# Patient Record
Sex: Male | Born: 1940 | Race: White | Hispanic: No | Marital: Married | Smoking: Never smoker
Health system: Southern US, Community
[De-identification: ages and names within clinical notes are randomized; demographics above are authoritative.]

## PROBLEM LIST (undated history)

## (undated) DIAGNOSIS — J189 Pneumonia, unspecified organism: Secondary | ICD-10-CM

## (undated) DIAGNOSIS — K222 Esophageal obstruction: Secondary | ICD-10-CM

## (undated) DIAGNOSIS — K5790 Diverticulosis of intestine, part unspecified, without perforation or abscess without bleeding: Secondary | ICD-10-CM

## (undated) DIAGNOSIS — K2960 Other gastritis without bleeding: Secondary | ICD-10-CM

## (undated) DIAGNOSIS — M1990 Unspecified osteoarthritis, unspecified site: Secondary | ICD-10-CM

## (undated) DIAGNOSIS — R55 Syncope and collapse: Secondary | ICD-10-CM

## (undated) DIAGNOSIS — I472 Ventricular tachycardia: Secondary | ICD-10-CM

## (undated) DIAGNOSIS — K449 Diaphragmatic hernia without obstruction or gangrene: Secondary | ICD-10-CM

## (undated) DIAGNOSIS — Z95818 Presence of other cardiac implants and grafts: Secondary | ICD-10-CM

## (undated) DIAGNOSIS — Z9861 Coronary angioplasty status: Secondary | ICD-10-CM

## (undated) DIAGNOSIS — I2102 ST elevation (STEMI) myocardial infarction involving left anterior descending coronary artery: Secondary | ICD-10-CM

## (undated) DIAGNOSIS — K219 Gastro-esophageal reflux disease without esophagitis: Secondary | ICD-10-CM

## (undated) DIAGNOSIS — H269 Unspecified cataract: Secondary | ICD-10-CM

## (undated) DIAGNOSIS — I2510 Atherosclerotic heart disease of native coronary artery without angina pectoris: Secondary | ICD-10-CM

## (undated) DIAGNOSIS — K2270 Barrett's esophagus without dysplasia: Secondary | ICD-10-CM

## (undated) DIAGNOSIS — E785 Hyperlipidemia, unspecified: Secondary | ICD-10-CM

## (undated) DIAGNOSIS — C4491 Basal cell carcinoma of skin, unspecified: Secondary | ICD-10-CM

## (undated) DIAGNOSIS — T7840XA Allergy, unspecified, initial encounter: Secondary | ICD-10-CM

## (undated) HISTORY — DX: Gastro-esophageal reflux disease without esophagitis: K21.9

## (undated) HISTORY — DX: Ventricular tachycardia: I47.2

## (undated) HISTORY — DX: ST elevation (STEMI) myocardial infarction involving left anterior descending coronary artery: I21.02

## (undated) HISTORY — PX: CARDIAC CATHETERIZATION: SHX172

## (undated) HISTORY — DX: Allergy, unspecified, initial encounter: T78.40XA

## (undated) HISTORY — DX: Other gastritis without bleeding: K29.60

## (undated) HISTORY — DX: Basal cell carcinoma of skin, unspecified: C44.91

## (undated) HISTORY — DX: Hyperlipidemia, unspecified: E78.5

## (undated) HISTORY — PX: PERCUTANEOUS CORONARY STENT INTERVENTION (PCI-S): SHX6016

## (undated) HISTORY — DX: Esophageal obstruction: K22.2

## (undated) HISTORY — DX: Unspecified cataract: H26.9

## (undated) HISTORY — DX: Unspecified osteoarthritis, unspecified site: M19.90

## (undated) HISTORY — DX: Diaphragmatic hernia without obstruction or gangrene: K44.9

## (undated) HISTORY — DX: Diverticulosis of intestine, part unspecified, without perforation or abscess without bleeding: K57.90

## (undated) HISTORY — PX: HERNIA REPAIR: SHX51

## (undated) HISTORY — DX: Coronary angioplasty status: Z98.61

## (undated) HISTORY — DX: Syncope and collapse: R55

## (undated) HISTORY — DX: Barrett's esophagus without dysplasia: K22.70

## (undated) HISTORY — DX: Atherosclerotic heart disease of native coronary artery without angina pectoris: I25.10

---

## 2004-11-03 DIAGNOSIS — I2510 Atherosclerotic heart disease of native coronary artery without angina pectoris: Secondary | ICD-10-CM

## 2004-11-03 DIAGNOSIS — Z9861 Coronary angioplasty status: Secondary | ICD-10-CM

## 2004-11-03 DIAGNOSIS — I25119 Atherosclerotic heart disease of native coronary artery with unspecified angina pectoris: Secondary | ICD-10-CM | POA: Insufficient documentation

## 2004-11-03 DIAGNOSIS — I2102 ST elevation (STEMI) myocardial infarction involving left anterior descending coronary artery: Secondary | ICD-10-CM

## 2004-11-03 HISTORY — DX: ST elevation (STEMI) myocardial infarction involving left anterior descending coronary artery: I21.02

## 2004-11-03 HISTORY — DX: Coronary angioplasty status: Z98.61

## 2004-11-03 HISTORY — DX: Atherosclerotic heart disease of native coronary artery without angina pectoris: I25.10

## 2007-11-17 ENCOUNTER — Ambulatory Visit: Payer: Self-pay | Admitting: Gastroenterology

## 2007-12-19 ENCOUNTER — Ambulatory Visit: Payer: Self-pay | Admitting: Gastroenterology

## 2007-12-26 ENCOUNTER — Ambulatory Visit: Payer: Self-pay | Admitting: Gastroenterology

## 2007-12-26 ENCOUNTER — Encounter: Payer: Self-pay | Admitting: Gastroenterology

## 2007-12-26 DIAGNOSIS — K2270 Barrett's esophagus without dysplasia: Secondary | ICD-10-CM | POA: Insufficient documentation

## 2007-12-26 DIAGNOSIS — K2960 Other gastritis without bleeding: Secondary | ICD-10-CM | POA: Insufficient documentation

## 2007-12-26 DIAGNOSIS — K5730 Diverticulosis of large intestine without perforation or abscess without bleeding: Secondary | ICD-10-CM | POA: Insufficient documentation

## 2007-12-26 DIAGNOSIS — K449 Diaphragmatic hernia without obstruction or gangrene: Secondary | ICD-10-CM | POA: Insufficient documentation

## 2007-12-26 DIAGNOSIS — K222 Esophageal obstruction: Secondary | ICD-10-CM | POA: Insufficient documentation

## 2008-01-26 ENCOUNTER — Ambulatory Visit: Payer: Self-pay | Admitting: Gastroenterology

## 2008-01-26 DIAGNOSIS — I2510 Atherosclerotic heart disease of native coronary artery without angina pectoris: Secondary | ICD-10-CM | POA: Insufficient documentation

## 2008-01-26 DIAGNOSIS — I252 Old myocardial infarction: Secondary | ICD-10-CM | POA: Insufficient documentation

## 2008-01-26 DIAGNOSIS — E785 Hyperlipidemia, unspecified: Secondary | ICD-10-CM | POA: Insufficient documentation

## 2008-01-26 DIAGNOSIS — E1169 Type 2 diabetes mellitus with other specified complication: Secondary | ICD-10-CM | POA: Insufficient documentation

## 2009-11-08 ENCOUNTER — Encounter (INDEPENDENT_AMBULATORY_CARE_PROVIDER_SITE_OTHER): Payer: Self-pay | Admitting: *Deleted

## 2010-07-10 ENCOUNTER — Telehealth: Payer: Self-pay | Admitting: Gastroenterology

## 2010-10-20 ENCOUNTER — Encounter (INDEPENDENT_AMBULATORY_CARE_PROVIDER_SITE_OTHER): Payer: Self-pay | Admitting: *Deleted

## 2010-10-20 ENCOUNTER — Telehealth: Payer: Self-pay | Admitting: Gastroenterology

## 2010-10-22 ENCOUNTER — Ambulatory Visit
Admission: RE | Admit: 2010-10-22 | Discharge: 2010-10-22 | Payer: Self-pay | Source: Home / Self Care | Attending: Gastroenterology | Admitting: Gastroenterology

## 2010-11-04 NOTE — Assessment & Plan Note (Signed)
Summary: OV      History of Present Illness: Alexander Armstrong returns today and is asymptomatic. Is on Protonix 40 mg a day. His previous endoscopy showed evidence of Barrett's mucosa. We'll continue chronic reflux regime and daily PPI therapy. He will have endoscopy in 2 years time. There is no evidence of dysplasia on his biopsies. He also has diverticulosis and is on a high fiber diet and fiber supplements. Vital signs are recorded in his chart. General physical exam is not performed.            Patient Instructions: 1)  Please schedule a follow-up appointment in 1 year. 2)  Barrett's Esophagus brochure given. 3)  Diet should be high in fiber (fruits, vegetables, whole grains) but low in residue. Drink at least eight (8) glasses of water a day. 4)  Please Continue current medications.     Appended Document: OV Vitals: Wt: 200 BP: 94/70 Pulse: 60

## 2010-11-04 NOTE — Letter (Signed)
Summary: Endoscopy Letter  [facility]  [address] [place], [place] [address]   Phone: [phone]  Fax: [phone]      November 08, 2009 MRN: [id number]   [facility] [address] [place], [place]  [address]   Dear Alexander Armstrong,   According to your medical record, it is time for you to schedule an Endoscopy. Endoscopic screening is recommended for patients with certain upper digestive tract conditions because of associated increased risk for cancers of the upper digestive system.  This letter has been generated based on the recommendations made at the time of your prior procedure. If you feel that in your particular situation this may no longer apply, please contact our office.  Please call our office at [phone]) to schedule this appointment or to update your records at your earliest convenience.  Thank you for cooperating with [place] to provide you with the very best care possible.   Sincerely,  Vania Rea. Jarold Motto, M.D.  [facility] [phone]

## 2010-11-04 NOTE — Progress Notes (Signed)
Summary: Schedule Endoscopy  Phone Note Outgoing Call Call back at [facility] Phone [phone]   Call placed by: Harlow Mares CMA Duncan Dull),  July 10, 2010 3:33 PM Call placed to: Patient Summary of Call: Left a message on patients machine to call back. patient is due for an EGD for barretts Initial call taken by: Harlow Mares CMA Duncan Dull),  July 10, 2010 3:34 PM  Follow-up for Phone Call        Left a message on the patient machine to call back and schedule a previsit and procedure with our office. A letter will be mailed to the patient.   Follow-up by: Harlow Mares CMA Duncan Dull),  July 18, 2010 11:37 AM

## 2010-11-05 ENCOUNTER — Other Ambulatory Visit: Payer: Self-pay | Admitting: Gastroenterology

## 2010-11-05 ENCOUNTER — Other Ambulatory Visit (AMBULATORY_SURGERY_CENTER): Payer: Medicare Other | Admitting: Gastroenterology

## 2010-11-05 ENCOUNTER — Ambulatory Visit: Admit: 2010-11-05 | Payer: Self-pay | Admitting: Gastroenterology

## 2010-11-05 ENCOUNTER — Encounter: Payer: Self-pay | Admitting: Gastroenterology

## 2010-11-05 DIAGNOSIS — K449 Diaphragmatic hernia without obstruction or gangrene: Secondary | ICD-10-CM

## 2010-11-05 DIAGNOSIS — K219 Gastro-esophageal reflux disease without esophagitis: Secondary | ICD-10-CM

## 2010-11-05 DIAGNOSIS — K2270 Barrett's esophagus without dysplasia: Secondary | ICD-10-CM

## 2010-11-05 HISTORY — DX: Diaphragmatic hernia without obstruction or gangrene: K44.9

## 2010-11-06 NOTE — Progress Notes (Signed)
Summary: question re: Plavix  Phone Note Outgoing Call   Call placed by: Karl Bales RN,  October 20, 2010 2:03 PM Summary of Call: Verified with Harlow Mares, CMA, if pt needs to hold Plavix for EGD and if letter needs to be generated to prescribing physician.  Hulan Saas states that Plavix doesn't need to be held for EGD Initial call taken by: Karl Bales RN,  October 20, 2010 2:04 PM

## 2010-11-06 NOTE — Miscellaneous (Signed)
Summary: RECALL EGD.Marland KitchenJJ.  Clinical Lists Changes  Observations: Added new observation of NKA: T (10/22/2010 15:45)

## 2010-11-06 NOTE — Letter (Signed)
Summary: EGD Instructions  [facility]  [address]   [place], [place] [address]   Phone: [phone]  Fax: [phone]       Alexander Armstrong    1941/04/27    MRN: [id number]       Procedure Day [name]: Wednesday, 11-05-10     Arrival Time:  1:00 p.m.     Procedure Time: 2:00 p.m.     Location of Procedure:                    x  [facility] (4th Floor)    PREPARATION FOR ENDOSCOPY   On 11-05-10  THE DAY OF THE PROCEDURE:  1.   No solid foods, milk or milk products are allowed after midnight the night before your procedure.  2.   Do not drink anything colored red or purple.  Avoid juices with pulp.  No orange juice.  3.  You may drink clear liquids until 12:00 p.m., which is 2 hours before your procedure.                                                                                                CLEAR LIQUIDS INCLUDE: Water Jello Ice Popsicles Tea (sugar ok, no milk/cream) Powdered fruit flavored drinks Coffee (sugar ok, no milk/cream) Gatorade Juice: apple, white grape, white cranberry  Lemonade Clear bullion, consomm, broth Carbonated beverages (any kind) Strained chicken noodle soup Hard Candy   MEDICATION INSTRUCTIONS  Unless otherwise instructed, you should take regular prescription medications with a small sip of water as early as possible the morning of your procedure.  Diabetic patients - see separate instructions.               OTHER INSTRUCTIONS  You will need a responsible adult at least 70 years of age to accompany you and drive you home.   This person must remain in the waiting room during your procedure.  Wear loose fitting clothing that is easily removed.  Leave jewelry and other valuables at home.  However, you may wish to bring a book to read or an iPod/MP3 player to listen to music as you wait for your procedure to start.  Remove all body piercing jewelry and leave at home.  Total time from sign-in until  discharge is approximately 2-3 hours.  You should go home directly after your procedure and rest.  You can resume normal activities the day after your procedure.  The day of your procedure you should not:   Drive   Make legal decisions   Operate machinery   Drink alcohol   Return to work  You will receive specific instructions about eating, activities and medications before you leave.    The above instructions have been reviewed and explained to me by  Karl Bales RN  October 22, 2010 4:18 PM     I fully understand and can verbalize these instructions _____________________________ Date _________

## 2010-11-11 ENCOUNTER — Encounter: Payer: Self-pay | Admitting: Gastroenterology

## 2010-11-12 NOTE — Procedures (Addendum)
Summary: Upper Endoscopy  Patient: Alexander Armstrong Note: All result statuses are Final unless otherwise noted.  Tests: (1) Upper Endoscopy (EGD)   EGD Upper Endoscopy       DONE     [facility]     [address]     [place], [place]  [address]           ENDOSCOPY PROCEDURE REPORT           PATIENT:  Alexander, Armstrong  MR#:  [id number]     BIRTHDATE:  [birth year], 69 yrs. old  GENDER:  male           ENDOSCOPIST:  Vania Rea. Jarold Motto, MD, [facility]     Referred by:           PROCEDURE DATE:  11/05/2010     PROCEDURE:  EGD with biopsy, 78469     ASA CLASS:  Class II     INDICATIONS:  Screening for Barrett's esopahgus in a GERD patient.                 MEDICATIONS:   Fentanyl 50 mcg IV, Versed 5 mg IV     TOPICAL ANESTHETIC:  Exactacain Spray           DESCRIPTION OF PROCEDURE:   After the risks benefits and     alternatives of the procedure were thoroughly explained, informed     consent was obtained.  The LB GIF-H180 [id number] endoscope was     introduced through the mouth and advanced to the second portion of     the duodenum, without limitations.  The instrument was slowly     withdrawn as the mucosa was fully examined.     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 3 cm. finger ofBARRETT'S MUCOSA     BIOPSIEDX 4.  A hiatal hernia was found. 3 CM. HH NOTED.NO     STRICTURE OR ACTIVE ESOPHAGITIS NOTED.  Normal duodenal folds were     noted.  The stomach was entered and closely examined. The antrum,     angularis, and lesser curvature were well visualized, including a     retroflexed view of the cardia and fundus. The stomach wall was     normally distensable. The scope passed easily through the pylorus     into the duodenum.    Retroflexed views revealed a hiatal hernia.     The scope was then withdrawn from the patient and the procedure     completed.           COMPLICATIONS:  None           ENDOSCOPIC IMPRESSION:     1) Barrett's esophagus     2)  Hiatal hernia     3) Normal duodenal folds     4) Normal stomach     5) A hiatal hernia     R/O DYSPLASIA.     RECOMMENDATIONS:     1) Await biopsy results     2) REPEAT SURVEILLANCE EGD IN 3 YEARS     TRIAL OF DEXILANT 60MG /QAM PER BREAKTHROUGH GERD SYMPTOMS.           REPEAT EXAM:  No           ______________________________     Vania Rea. Jarold Motto, MD, [facility]           CC:  Wilmer Floor MD  n.     eSIGNED:   Vania Rea. Langston Tuberville at 11/05/2010 02:29 PM           Esaw Dace, [id number]  Note: An exclamation mark (!) indicates a result that was not dispersed into the flowsheet. Document Creation Date: 11/05/2010 2:30 PM _______________________________________________________________________  (1) Order result status: Final Collection or observation date-time: 11/05/2010 14:22 Requested date-time:  Receipt date-time:  Reported date-time:  Referring Physician:   Ordering Physician: Sheryn Bison [phone]) Specimen Source:  Source: Launa Grill Order Number: [id number] Lab site:   Appended Document: Upper Endoscopy     Procedures Next Due Date:    EGD: 11/2013  Appended Document: Upper Endoscopy     Procedures Next Due Date:    EGD: 11/2013

## 2010-11-12 NOTE — Miscellaneous (Signed)
Summary: Dexilant Samples  Clinical Lists Changes  Medications: Changed medication from PROTONIX 40 MG TBEC (PANTOPRAZOLE SODIUM) Take 1 tablet by mouth once a day to DEXILANT 60 MG CPDR (DEXLANSOPRAZOLE) take one by mouth once daily

## 2010-11-20 NOTE — Letter (Signed)
Summary: Patient Notice-Barrett's [facility]  [address] [place], [place] [address]   Phone: [phone]  Fax: [phone]        November 11, 2010 MRN: [id number]    [facility] [address] [place], [place]  [address]    Dear Mr. Lacorte,  I am pleased to inform you that the biopsies taken during your recent endoscopic examination did not show any evidence of cancer upon pathologic examination.  However, your biopsies indicate you have a condition known as Barrett's esophagus. While not cancer, it is pre-cancerous (can progress to cancer) and needs to be monitored with repeat endoscopic examination and biopsies.  Fortunately, it is quite rare that this develops into cancer, but careful monitoring of the condition along with taking your medication as prescribed is important in reducing the risk of developing cancer.  It is my recommendation that you have a repeat upper gastrointestinal endoscopic examination in 3_ years.  Additional information/recommendations:  __Please call [phone] to schedule a return visit to further      evaluate your condition.  xx__Continue with treatment plan as outlined the day of your exam.I would recommend continuing Dexilant 60 mg a day every morning for GERD and your Barrett's mucosa. Please call if you are still having problems on this medication.  Please call us if you have or develop heartburn, reflux symptoms, any swallowing problems, or if you have questions about your condition that have not been fully answered at this time.  Sincerely,  Mardella Layman MD [facility]  This letter has been electronically signed by your physician.  Appended Document: Patient Notice-Barrett's Esopghagus Letter Mailed

## 2011-02-17 NOTE — Assessment & Plan Note (Signed)
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   Alexander Armstrong really denies GI complaints, although on close questioning, he  has had acid reflux for at least ten years and has been on daily  Prilosec during that time.  He apparently had a barium swallow some five  years ago and was having dysphagia.  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  He has not had endoscopic exam or  dilations.  He currently says his reflux, which consists mostly of  regurgitation and burning substernal chest pain, is all relieved by  taking regular Prilosec.  If he skips his medication, he has marked  symptomatology.   He has fairly regular bowel movements, but will occasionally waken from  a sound sleep with severe lower abdominal pain, followed by tenesmus,  but no rectal bleeding.  This happens once every two to three months.  Again, he has never had barium studies or endoscopic exams of his lower  bowel.  He gives no history of anemia or iron deficiency.  He does not  think he has ever done Hemoccult cards.  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  He follows a regular diet,  denies any specific food intolerances and has had no anorexia or weight-  loss.  Denies any history of hepatitis, pancreatitis, or any  hepatobiliary symptomatology.  He denies abuse of NSAIDs, alcohol, or  cigarettes.   PAST MEDICAL HISTORY:  Remarkable for rather severe coronary artery  disease with an MI some three years ago, requiring emergency cardiac  catheterization and  angioplasty, along with multiple stent placement by  Dr. Chales Abrahams.  He has been on aspirin and Plavix since that time and  apparently had repeat angiography within the last year.  Patient has no  current angina type complaints, palpitations, or symptoms of congestive  heart failure.  He says he does bruise easily, but has been told to  continue his aspirin and Plavix, without discontinuing these medications  for surgical procedures.  He also has had previous umbilical hernia  surgical repair some 20 years ago.  He does suffer from hyperlipidemia.   MEDICATIONS:  1. Lipitor 20 mg a day.  2. Plavix 75 mg a day.  3. Metoprolol 25 mg a day.  4. Prilosec over-the-counter 20 mg a day.  5. Aspirin 81 mg a day.  6. Glucosamine 1500 mg two a day.  7. Chondroitin 1200 mg two a day.  8. Fish oil 2000 mg a day.   He denies drug allergies.   FAMILY HISTORY:  Noncontributory.  His mother apparently did have  uterine cancer.   SOCIAL HISTORY:  The patient is married and lives  with his wife, has a  high school education.  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  He does not smoke or use ethanol and gives no history of abuse of these  substances.   REVIEW OF SYSTEMS:  Remarkable for some vague arthralgias, but otherwise  review of systems is noncontributory.  As mentioned above, he has no  current cardiovascular or pulmonary complaints.   He is a healthy-appearing white male in no distress, appearing somewhat  younger than his stated age.  He is 6 feet tall and weighs 201 pounds.  Blood pressure is 120/78,  pulse was 78 and regular.  I could not appreciate stigmata of chronic liver disease or thyromegaly.  His chest was entirely clear.  He appeared to be in a regular rhythm  without murmurs, gallops or rubs.  I could not appreciate hepatosplenomegaly, abdominal masses or  tenderness.  Bowel sounds were normal.  Peripheral extremities were unremarkable.  Mental status was clear.   Rectal exam was deferred.   ASSESSMENT:  1. Chronic acid reflux.  Rule out Barrett's mucosa.  2. Need for screening colonoscopy with unexplained episodes of what      sounds like a variant of irritable bowel syndrome and possible      diverticulosis coli.  3. Atherosclerosis with severe coronary artery disease, previous MI,      and history of angioplasty and stent placements.  4. Chronic anticoagulation with aspirin and Plavix, related to #3.  5. Previous umbilical hernia surgical repair without obvious hernia on      exam today.   RECOMMENDATIONS:  1. Outpatient endoscopy and colonoscopy at his convenience.  2. We will ask Dr. Olena Leatherwood to send [place] any pertinent laboratory data,      including recent CBC.  3. I have given the patient information to give to his physicians      concerning cytochrome P-450 metabolism of Plavix and proton pump      inhibitor use.  Up to 30% of patients on Plavix can have inhibition      of activation by Plavix per proton pump inhibitors, but Protonix      does not affect this metabolic pathway.  I would suggest switching      the patient, if his physicians agree, to Protonix 40 mg a day in      place of Prilosec.  4. We will ask Dr. Chales Abrahams and Dr. Bartholomew Crews opinion as to holding the      patient's Plavix and aspirin for colonoscopy and possible      polypectomy, although I doubt this will be possible with his      stenting.  For now, we will plan to schedule his procedure on these      anticoagulants with the      known increased risk of possible bleeding, post-polypectomy.  5. Continue all other medications mentioned above.     Vania Rea. Jarold Motto, MD, Caleen Essex, FAGA  Electronically Signed    DRP/MedQ  DD: 11/17/2007  DT: 11/18/2007  Job #: [id number]   cc:   [place]  Dr. Constance Haw [address] [facility]  Wil[place] Floor

## 2011-07-20 DIAGNOSIS — Z85828 Personal history of other malignant neoplasm of skin: Secondary | ICD-10-CM | POA: Insufficient documentation

## 2011-10-19 DIAGNOSIS — Z483 Aftercare following surgery for neoplasm: Secondary | ICD-10-CM | POA: Insufficient documentation

## 2012-03-10 ENCOUNTER — Encounter: Payer: Self-pay | Admitting: Gastroenterology

## 2012-04-05 ENCOUNTER — Other Ambulatory Visit (INDEPENDENT_AMBULATORY_CARE_PROVIDER_SITE_OTHER): Payer: Medicare Other

## 2012-04-05 ENCOUNTER — Encounter: Payer: Self-pay | Admitting: Gastroenterology

## 2012-04-05 ENCOUNTER — Ambulatory Visit (INDEPENDENT_AMBULATORY_CARE_PROVIDER_SITE_OTHER): Payer: Medicare Other | Admitting: Gastroenterology

## 2012-04-05 VITALS — BP 96/60 | HR 72 | Ht 72.0 in | Wt 203.4 lb

## 2012-04-05 DIAGNOSIS — R6889 Other general symptoms and signs: Secondary | ICD-10-CM

## 2012-04-05 DIAGNOSIS — K2270 Barrett's esophagus without dysplasia: Secondary | ICD-10-CM

## 2012-04-05 DIAGNOSIS — I2510 Atherosclerotic heart disease of native coronary artery without angina pectoris: Secondary | ICD-10-CM

## 2012-04-05 DIAGNOSIS — D649 Anemia, unspecified: Secondary | ICD-10-CM

## 2012-04-05 DIAGNOSIS — R109 Unspecified abdominal pain: Secondary | ICD-10-CM

## 2012-04-05 LAB — CBC WITH DIFFERENTIAL/PLATELET
Basophils Absolute: 0.1 10*3/uL (ref 0.0–0.1)
Basophils Relative: 1 % (ref 0.0–3.0)
Eosinophils Absolute: 0.3 10*3/uL (ref 0.0–0.7)
Eosinophils Relative: 4.6 % (ref 0.0–5.0)
HCT: 41.4 % (ref 39.0–52.0)
Hemoglobin: 13.9 g/dL (ref 13.0–17.0)
Lymphocytes Relative: 19.2 % (ref 12.0–46.0)
Lymphs Abs: 1.2 10*3/uL (ref 0.7–4.0)
MCHC: 33.6 g/dL (ref 30.0–36.0)
MCV: 94.6 fl (ref 78.0–100.0)
Monocytes Absolute: 0.8 10*3/uL (ref 0.1–1.0)
Monocytes Relative: 12.6 % — ABNORMAL HIGH (ref 3.0–12.0)
Neutro Abs: 3.9 10*3/uL (ref 1.4–7.7)
Neutrophils Relative %: 62.6 % (ref 43.0–77.0)
Platelets: 182 10*3/uL (ref 150.0–400.0)
RBC: 4.38 Mil/uL (ref 4.22–5.81)
RDW: 13.2 % (ref 11.5–14.6)
WBC: 6.3 10*3/uL (ref 4.5–10.5)

## 2012-04-05 LAB — HEPATIC FUNCTION PANEL
ALT: 28 U/L (ref 0–53)
AST: 32 U/L (ref 0–37)
Albumin: 3.7 g/dL (ref 3.5–5.2)
Alkaline Phosphatase: 57 U/L (ref 39–117)
Bilirubin, Direct: 0.1 mg/dL (ref 0.0–0.3)
Total Bilirubin: 0.7 mg/dL (ref 0.3–1.2)
Total Protein: 7.3 g/dL (ref 6.0–8.3)

## 2012-04-05 LAB — BASIC METABOLIC PANEL
BUN: 22 mg/dL (ref 6–23)
CO2: 24 mEq/L (ref 19–32)
Calcium: 9.6 mg/dL (ref 8.4–10.5)
Chloride: 105 mEq/L (ref 96–112)
Creatinine, Ser: 0.9 mg/dL (ref 0.4–1.5)
GFR: 90.77 mL/min (ref >60.00)
Glucose, Bld: 101 mg/dL — ABNORMAL HIGH (ref 70–99)
Potassium: 4.3 mEq/L (ref 3.5–5.1)
Sodium: 137 mEq/L (ref 135–145)

## 2012-04-05 LAB — FOLATE: Folate: >24.8 ng/mL (ref >5.9)

## 2012-04-05 LAB — IBC PANEL
Iron: 86 ug/dL (ref 42–165)
Saturation Ratios: 28.8 % (ref 20.0–50.0)
Transferrin: 213.3 mg/dL (ref 212.0–360.0)

## 2012-04-05 LAB — TSH: TSH: 2.44 u[IU]/mL (ref 0.35–5.50)

## 2012-04-05 LAB — FERRITIN: Ferritin: 100.8 ng/mL (ref 22.0–322.0)

## 2012-04-05 LAB — VITAMIN B12: Vitamin B-12: 275 pg/mL (ref 211–911)

## 2012-04-05 NOTE — Patient Instructions (Addendum)
You will need a follow up colonoscopy/Endoscopy in 2015 You will go to the basement for labs today

## 2012-04-05 NOTE — Progress Notes (Signed)
This is a 71 year old Caucasian male with chronic acid reflux in Barrett's mucosa who is currently asymptomatic on daily Protonix 40 mg. He is up-to-date on his endoscopy and colonoscopy. The patient has regular bowel movements without melena, hematochezia, and denies chest pain or dysphagia. His appetite is good his weight is stable. He does have coronary artery disease with previous stents, and is followed by cardiology in [facility]. He does not have a primary care physician.  Current Medications, Allergies, Past Medical History, Past Surgical History, Family History and Social History were reviewed in [employer] record.  Pertinent Review of Systems Negative   Physical Exam: Blood pressure 96/60, pulse 72 and Reglan, and weight 203 pounds with BMI 27.58. His a healthy-appearing patient in no acute distress. I cannot appreciate stigmata of chronic liver disease. Chest is clear and he is in a regular rhythm without murmurs gallops or rubs. There is no hepatosplenomegaly, but the does appear to be a slightly prominent right hepatic lobe which is nonnodular nontender. I cannot appreciate splenomegaly or abdominal ascites. Bowel sounds are normal. Peripheral extremities are unremarkable. Mental status is normal.    Assessment and Plan: Well controlled GERD with Barrett's mucosa. I have urged him to continue daily PPI therapy, and he is due for followup endoscopy and colonoscopy in 2015. Labs ordered today for review including liver function test and anemia profile. Patient is on Plavix 75 mg a day and aspirin 81 mg a day. I have asked him to return IFOB  Stool cards for occult blood examination. Standard antireflux regime reviewed with patient.  Encounter Diagnoses  Name Primary?  . Barrett's esophagus Yes  . CAD (coronary artery disease)   . Abdominal  pain, other specified site   . Anemia   . Other general symptoms

## 2012-05-06 ENCOUNTER — Other Ambulatory Visit: Payer: Medicare Other

## 2012-05-06 DIAGNOSIS — K2270 Barrett's esophagus without dysplasia: Secondary | ICD-10-CM

## 2012-05-06 LAB — FECAL OCCULT BLOOD, IMMUNOCHEMICAL: Fecal Occult Bld: NEGATIVE

## 2012-10-05 DIAGNOSIS — I4729 Other ventricular tachycardia: Secondary | ICD-10-CM

## 2012-10-05 DIAGNOSIS — I472 Ventricular tachycardia: Secondary | ICD-10-CM

## 2012-10-05 HISTORY — PX: ELECTROPHYSIOLOGIC STUDY: SHX172A

## 2012-10-05 HISTORY — DX: Other ventricular tachycardia: I47.29

## 2012-10-05 HISTORY — DX: Ventricular tachycardia: I47.2

## 2013-09-26 ENCOUNTER — Ambulatory Visit: Payer: Medicare Other | Admitting: Gastroenterology

## 2013-10-24 ENCOUNTER — Encounter: Payer: Self-pay | Admitting: Gastroenterology

## 2013-10-24 ENCOUNTER — Telehealth: Payer: Self-pay | Admitting: *Deleted

## 2013-10-24 ENCOUNTER — Ambulatory Visit (INDEPENDENT_AMBULATORY_CARE_PROVIDER_SITE_OTHER): Payer: Medicare Other | Admitting: Gastroenterology

## 2013-10-24 VITALS — BP 114/82 | HR 67 | Ht 72.0 in | Wt 201.2 lb

## 2013-10-24 DIAGNOSIS — K2270 Barrett's esophagus without dysplasia: Secondary | ICD-10-CM

## 2013-10-24 DIAGNOSIS — K219 Gastro-esophageal reflux disease without esophagitis: Secondary | ICD-10-CM

## 2013-10-24 DIAGNOSIS — Z7901 Long term (current) use of anticoagulants: Secondary | ICD-10-CM

## 2013-10-24 NOTE — Patient Instructions (Signed)
You have been scheduled for an endoscopy with propofol. Please follow written instructions given to you at your visit today. If you use inhalers (even only as needed), please bring them with you on the day of your procedure. Your physician has requested that you go to www.startemmi.com and enter the access code given to you at your visit today. This web site gives a general overview about your procedure. However, you should still follow specific instructions given to you by our office regarding your preparation for the procedure.  You will be contacted by our office prior to your procedure for directions on holding your Plavix.  If you do not hear from our office 1 week prior to your scheduled procedure, please call [phone] to discuss.

## 2013-10-24 NOTE — Telephone Encounter (Signed)
I called Dr. Valentina Lucks office regarding holding Plavix for procedure--number is [phone]. Was advised to fax below letter to [phone]       Dear Dr.Tyson,    We have scheduled the above patient for an endoscopic procedure. Our records show that he is on anticoagulation therapy.   Please advise as to how long the patient may come off his therapy of Plavix prior to the procedure, which is scheduled for 11-06-2013.  Please fax back/ or route the completed form to [place], [place] at [phone].   Sincerely,    Hope Pigeon

## 2013-10-24 NOTE — Progress Notes (Signed)
This is a 73 year old Caucasian male who has chronic GERD managed by daily Protonix 40 mg.  He has no dysphagia, other upper GI, lower GI, or hepatobiliary complaints.  He does have diverticulosis with an otherwise negative colonoscopy 5 years ago.  He is due for followup endoscopy with dysplasia screening of his Barrett's mucosa.  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 Baxter Hire in [place] ,[place].  Current Medications, Allergies, Past Medical History, Past Surgical History, Family History and Social History were reviewed in [employer] record.  ROS: All systems were reviewed and are negative unless otherwise stated in the HPI.          Physical Exam: Healthy-appearing patient in no distress.  Blood pressure 114/82, pulse 67, and weight 201 pounds.  Cannot appreciate stigmata of chronic liver disease.  Chest is clear he appears to be in a regular rhythm without murmurs gallops or rubs.  There is no organomegaly, abdominal masses or tenderness.  Bowel sounds are normal.  Peripheral extremities are unremarkable and mental status is normal.    Assessment and Plan: Chronic GERD well-managed with daily PPI therapy.  This patient has Barrett's mucosa and is due for screening endoscopy with dysplasia biopsies.  We'll schedule this after clearance with cardiology so we can hold his Plavix 5 days before the procedure unless otherwise advised.  Standard antireflux maneuvers reviewed and we'll continue Protonix 40 mg a day.  I do not think he needs to followup colonoscopy at this time.

## 2013-10-26 ENCOUNTER — Telehealth: Payer: Self-pay | Admitting: *Deleted

## 2013-10-26 NOTE — Telephone Encounter (Signed)
Patient called back I advised patient that per Dr. Elonda Husky ok to hold his Plavix 5 days before procedure Patient verbalized understanding

## 2013-10-26 NOTE — Telephone Encounter (Signed)
Received via fax from Dr. Elonda Husky office letter back that it is ok for patient to hold Plavix 5 days before procedure. Sent downstairs to be scanned into patient's chart.  I called patient to advise him that per Dr. Elonda Husky ok to hold Plavix for five days prior. Had to leave message with patient's wife for patient to call the office back, waiting on call back.

## 2013-11-06 ENCOUNTER — Encounter: Payer: Self-pay | Admitting: Gastroenterology

## 2013-11-06 ENCOUNTER — Ambulatory Visit (AMBULATORY_SURGERY_CENTER): Payer: Medicare Other | Admitting: Gastroenterology

## 2013-11-06 VITALS — BP 107/67 | HR 59 | Temp 95.7°F | Resp 13 | Ht 72.0 in | Wt 201.0 lb

## 2013-11-06 DIAGNOSIS — K2270 Barrett's esophagus without dysplasia: Secondary | ICD-10-CM

## 2013-11-06 DIAGNOSIS — K219 Gastro-esophageal reflux disease without esophagitis: Secondary | ICD-10-CM

## 2013-11-06 MED ORDER — SODIUM CHLORIDE 0.9 % IV SOLN: 500.0000 mL | INTRAVENOUS | Status: DC

## 2013-11-06 NOTE — Progress Notes (Signed)
Procedure ends, to recovery, report given and VSS. 

## 2013-11-06 NOTE — Op Note (Signed)
[place]  [address] [place], [address]   ENDOSCOPY PROCEDURE REPORT  PATIENT: Alexander Armstrong, Alexander Armstrong  MR#: [id number] BIRTHDATE: May 19, 1941 , 74  yrs. old GENDER: Male ENDOSCOPIST:Arden Axon Consuello Masse, MD, [facility] REFERRED BY: PROCEDURE DATE:  11/06/2013 PROCEDURE:   EGD w/ biopsy ASA CLASS:    Class II INDICATIONS: Barrett's screening. MEDICATION: Propofol (Diprivan) 110 mg IV TOPICAL ANESTHETIC:  DESCRIPTION OF PROCEDURE:   After the risks and benefits of the procedure were explained, informed consent was obtained.  The LB [id number]  endoscope was introduced through the mouth  and advanced to the second portion of the duodenum .  The instrument was slowly withdrawn as the mucosa was fully examined.      DUODENUM: The duodenal mucosa showed no abnormalities in the bulb and second portion of the duodenum.  STOMACH: The mucosa of the stomach appeared normal.  ESOPHAGUS: There was evidence of a short segment of Barrett's esophagus in the lower third of the esophagus.  Multiple biopsies were performed using cold forceps.See pictures for documentation. Sample sent for histology.    Retroflexed views revealed a hiatal hernia.    The scope was then withdrawn from the patient and the procedure completed.  COMPLICATIONS: There were no complications.   ENDOSCOPIC IMPRESSION: 1.   The duodenal mucosa showed no abnormalities in the bulb and second portion of the duodenum 2.   The mucosa of the stomach appeared normal 3.   There was evidence of Barrett's esophagus; multiple biopsies ...chronic treated GERD  RECOMMENDATIONS: 1.  Await pathology results 2.  Continue current medications...resume Plavix tomorrow 3. F/U per pathology exam    _______________________________ eSigned:  Sable Feil, MD, [facility] 11/06/2013 10:15 AM   standard discharge

## 2013-11-06 NOTE — Progress Notes (Signed)
Called to room post procedure, verified with staff identification and specimen taken.

## 2013-11-06 NOTE — Patient Instructions (Signed)

## 2013-11-07 ENCOUNTER — Telehealth: Payer: Self-pay

## 2013-11-07 NOTE — Telephone Encounter (Signed)
Left message on answering machine. 

## 2013-11-10 ENCOUNTER — Encounter: Payer: Self-pay | Admitting: Gastroenterology

## 2014-07-05 HISTORY — PX: NM MYOVIEW LTD: HXRAD82

## 2015-09-17 DIAGNOSIS — I472 Ventricular tachycardia: Secondary | ICD-10-CM | POA: Insufficient documentation

## 2015-09-17 DIAGNOSIS — I4729 Other ventricular tachycardia: Secondary | ICD-10-CM | POA: Insufficient documentation

## 2015-11-18 DIAGNOSIS — Z959 Presence of cardiac and vascular implant and graft, unspecified: Secondary | ICD-10-CM | POA: Insufficient documentation

## 2015-11-18 DIAGNOSIS — R7309 Other abnormal glucose: Secondary | ICD-10-CM | POA: Insufficient documentation

## 2015-11-18 DIAGNOSIS — M545 Low back pain, unspecified: Secondary | ICD-10-CM | POA: Insufficient documentation

## 2015-11-18 DIAGNOSIS — L600 Ingrowing nail: Secondary | ICD-10-CM | POA: Insufficient documentation

## 2015-11-18 DIAGNOSIS — H524 Presbyopia: Secondary | ICD-10-CM | POA: Insufficient documentation

## 2015-11-18 DIAGNOSIS — K429 Umbilical hernia without obstruction or gangrene: Secondary | ICD-10-CM | POA: Insufficient documentation

## 2015-11-18 DIAGNOSIS — R002 Palpitations: Secondary | ICD-10-CM | POA: Insufficient documentation

## 2015-11-18 DIAGNOSIS — H2513 Age-related nuclear cataract, bilateral: Secondary | ICD-10-CM | POA: Insufficient documentation

## 2015-11-18 DIAGNOSIS — H43813 Vitreous degeneration, bilateral: Secondary | ICD-10-CM | POA: Insufficient documentation

## 2015-11-18 DIAGNOSIS — R351 Nocturia: Secondary | ICD-10-CM

## 2015-11-18 DIAGNOSIS — Z961 Presence of intraocular lens: Secondary | ICD-10-CM | POA: Insufficient documentation

## 2015-11-18 DIAGNOSIS — K219 Gastro-esophageal reflux disease without esophagitis: Secondary | ICD-10-CM | POA: Insufficient documentation

## 2015-11-18 DIAGNOSIS — N401 Enlarged prostate with lower urinary tract symptoms: Secondary | ICD-10-CM | POA: Insufficient documentation

## 2015-11-18 DIAGNOSIS — H5203 Hypermetropia, bilateral: Secondary | ICD-10-CM | POA: Insufficient documentation

## 2015-11-18 DIAGNOSIS — E119 Type 2 diabetes mellitus without complications: Secondary | ICD-10-CM | POA: Insufficient documentation

## 2016-03-10 DIAGNOSIS — R55 Syncope and collapse: Secondary | ICD-10-CM | POA: Insufficient documentation

## 2016-10-15 ENCOUNTER — Telehealth: Payer: Self-pay | Admitting: *Deleted

## 2016-10-15 NOTE — Telephone Encounter (Signed)
SPOKE TO WIFE . WIFE STATES HER HUSBAND WOULD LIKE TO ESTABLISH WITH DR [facility] FOR NEW CARDIOLOGIST  NEW  EVALUATION APPOINTMENT MADE FOR 12/08/16 ALONG WITH WIFE.  VERBALIZED UNDERSTANDING

## 2016-11-03 DIAGNOSIS — M7741 Metatarsalgia, right foot: Secondary | ICD-10-CM | POA: Insufficient documentation

## 2016-11-03 DIAGNOSIS — L84 Corns and callosities: Secondary | ICD-10-CM | POA: Insufficient documentation

## 2016-12-07 ENCOUNTER — Encounter: Payer: Self-pay | Admitting: *Deleted

## 2016-12-08 ENCOUNTER — Other Ambulatory Visit: Payer: Self-pay | Admitting: *Deleted

## 2016-12-08 ENCOUNTER — Ambulatory Visit (INDEPENDENT_AMBULATORY_CARE_PROVIDER_SITE_OTHER): Payer: Medicare Other | Admitting: Cardiology

## 2016-12-08 ENCOUNTER — Encounter: Payer: Self-pay | Admitting: *Deleted

## 2016-12-08 VITALS — BP 116/62 | HR 60 | Ht 72.0 in | Wt 207.0 lb

## 2016-12-08 DIAGNOSIS — I25119 Atherosclerotic heart disease of native coronary artery with unspecified angina pectoris: Secondary | ICD-10-CM | POA: Diagnosis not present

## 2016-12-08 DIAGNOSIS — R079 Chest pain, unspecified: Secondary | ICD-10-CM | POA: Diagnosis not present

## 2016-12-08 DIAGNOSIS — E119 Type 2 diabetes mellitus without complications: Secondary | ICD-10-CM | POA: Diagnosis not present

## 2016-12-08 DIAGNOSIS — E785 Hyperlipidemia, unspecified: Secondary | ICD-10-CM

## 2016-12-08 DIAGNOSIS — I252 Old myocardial infarction: Secondary | ICD-10-CM | POA: Diagnosis not present

## 2016-12-08 DIAGNOSIS — R002 Palpitations: Secondary | ICD-10-CM | POA: Diagnosis not present

## 2016-12-08 DIAGNOSIS — Z959 Presence of cardiac and vascular implant and graft, unspecified: Secondary | ICD-10-CM

## 2016-12-08 DIAGNOSIS — R000 Tachycardia, unspecified: Secondary | ICD-10-CM

## 2016-12-08 DIAGNOSIS — I472 Ventricular tachycardia: Secondary | ICD-10-CM

## 2016-12-08 NOTE — Patient Instructions (Signed)
Schedule [address] Your physician has requested that you have a lexiscan myoview. For further information please visit [email or web address]. Please follow instruction sheet, as given.    You have been referred to Dr Curt Bears  ( EP)  [place] [facility].   Your physician wants you to follow-up in 2 MONTHS WITH DR HARDING.    If you need a refill on your cardiac medications before your next appointment, please call your pharmacy.

## 2016-12-08 NOTE — Progress Notes (Signed)
PCP: Yong Channel, MD  Clinic Note: Chief Complaint  Patient presents with  . 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 male with a PMH below who presents today for establishment of cardiology care. He is a history of paroxysmal VT previously followed at [facility] - by Dr. Elonda Husky. 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  He had EP evaluation for dizziness and syncope - thought to be related to wide complex tachycardia and was unable to induce VT or VF -  REVEAL LINQ Loop recorder inserted (& replaced) -- , and was started on flecainide 50 mg daily after an episode of Sustained VT noted on the monitor.  Connar Keating was last seen by Dr. Atilano Median in Nov 2017.  - no changes   Recent Hospitalizations: 4 weeks ago - [facility] with concern for MI (Sat PM-Sun PM - r/o MI)  Studies Reviewed: No studies to review. -- I was able to review his most recent clinic note on Care Everywhere.  Interval History: [name] presents here today to establish cardiology care. After his initial cardiologist retired, he felt that it would be best for him to come to the same place where his wife is for care. He indicates these were doing quite well with no angina type symptoms - with the exception of an episode described below. He is always on the go with work from roughly 6 to the morning to 6:30 at night. Occasional heart skipping or flip-flopping episodes, but nothing that is prolonged. As for the near syncopal type symptoms that he had when he had his Lipitor placed, he has not really noted any of those symptoms since starting flecainide, but he does have some brief "weakness spells. Really feel palpitations associated with them.  On February 3 work walking around  and felt all sudden sharp chest pain that lasted several minutes. Was described as a sharp type discomfort that radiated up into his jaw and left arm. This is the first time use of her had anything like that since his MI. He was feeling little bit fatigued afterwards and little short of breath. It took him a while to get settled down. But he hasn't really had any symptoms like that. But is daily point out that he really hasn't been that active since that spell.  He denies any significant exertional dyspnea or other chest pain besides the episode. No PND, orthopnea or edema.  No syncope/near syncope - but he has had the "weakness spells that maybe last a few seconds and occur every several months or so. No TIA/amaurosis fugax symptoms.  No claudication.  ROS: A comprehensive was performed. Review of Systems  Constitutional: Negative for malaise/fatigue.  HENT: Negative for congestion and nosebleeds.   Respiratory: Negative for cough, shortness of breath and wheezing.   Cardiovascular:       Per history of present illness  Gastrointestinal: Positive for blood in stool and melena. Negative for constipation.  Musculoskeletal: Negative for joint pain.  Neurological: Negative for dizziness and loss of consciousness (No loss of consciousness in quite some time. But still has "weak spells ").  Endo/Heme/Allergies: Does not bruise/bleed easily.  Psychiatric/Behavioral: Negative for depression and memory loss. The patient  is not nervous/anxious and does not have insomnia.   All other systems reviewed and are negative.   Past Medical History:  Diagnosis Date  . Allergy   . Arthritis   . 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 basal cell skin cancer   . CAD S/P percutaneous coronary angioplasty 11/03/2004   a. mLAD - Taxus DES 3.0 x 20; b. (11/06/04) staged PCI to RCA Taxus DES 3.5 x 16  . Cardiac syncope    No episode since starting flecainide. Had documented WIDE COMPLEX Tachycardia on loop  recorder.  . Cataract bilateral cataracts  . Diverticulosis   . Esophageal stricture   . GERD (gastroesophageal reflux disease)   . Hiatal hernia 11/05/2010  . HLD (hyperlipidemia)   . Nonsustained paroxysmal ventricular tachycardia ([place]) 2014   Presumably controlled with flecainide. Unable to induce during EP study.  . Reflux gastritis   . STEMI involving left anterior descending coronary artery ([place]) 11/03/2004   [facility]: Occluded LAD treated with DES. Staged PCI to the RCA.    Past Surgical History:  Procedure Laterality Date  . CARDIAC CATHETERIZATION  ~2011   [facility]: re-look Cath - patent stents  . ELECTROPHYSIOLOGIC STUDY  2014   Unable to stimulate VT seen on loop recorder  . implanted loop heart monitor x2  ~2011; 2014   Per report- batter out of date; apparently captured a prolonged episode of wide complex tachycardia associated with an episode of weakness/near syncope  . PERCUTANEOUS CORONARY STENT INTERVENTION (PCI-S)  January-February 2006   a. mLAD - Taxus DES 3.0 x 20; b. (11/06/04) staged PCI to RCA Taxus DES 3.5 x 16  . UMBILICAL HERNIA REPAIR      Current Meds  Medication Sig  . aspirin 81 MG tablet Take 81 mg by mouth daily.  . cetirizine (ZYRTEC) 10 MG tablet Take 10 mg by mouth as needed.  . Cholecalciferol (VITAMIN D) 2000 UNITS CAPS Take 1 capsule by mouth daily.  . clopidogrel (PLAVIX) 75 MG tablet Take 75 mg by mouth daily.  . fexofenadine (ALLEGRA) 180 MG tablet Take 180 mg by mouth daily.  . flecainide (TAMBOCOR) 50 MG tablet   . ipratropium (ATROVENT) 0.03 % nasal spray Place 2 sprays into the nose as needed for rhinitis.   . pantoprazole (PROTONIX) 40 MG tablet Take 40 mg by mouth daily.  . propranolol (INDERAL) 20 MG tablet Take 20 mg by mouth 2 (two) times daily.  . rosuvastatin (CRESTOR) 20 MG tablet Take 20 mg by mouth daily.  . tamsulosin (FLOMAX) 0.4 MG CAPS capsule     Allergies  Allergen Reactions  . Silver Sulfadiazine Rash  and Hives  . Hydrocodone     rash rash    Social History   Social History  . Marital status: Married    Spouse name: N/A  . Number of children: 1  . Years of education: N/A   Occupational History  . furniture Retired   Social History Main Topics  . Smoking status: Never Smoker  . Smokeless tobacco: Never Used  . Alcohol use No  . Drug use: No  . Sexual activity: Not Asked   Other Topics Concern  . None   Social History Narrative  . None    family history includes Cancer in his father; Glaucoma in his paternal aunt; Heart attack in his brother; Heart disease in his mother; Macular degeneration in his father; Multiple myeloma in his mother; Prostate cancer in his father; Uterine cancer  in his mother.  Wt Readings from Last 3 Encounters:  12/08/16 93.9 kg (207 lb)  11/06/13 91.2 kg (201 lb)  10/24/13 91.3 kg (201 lb 3.2 oz)    PHYSICAL EXAM BP 116/62   Pulse 60   Ht 6' (1.829 m)   Wt 93.9 kg (207 lb)   SpO2 96%   BMI 28.07 kg/m  General appearance: alert, cooperative, appears stated age, no distress and Well-nourished, well-groomed. HEENT: [place]/AT, EOMI, MMM, anicteric sclera Neck: no adenopathy, no carotid bruit and no JVD Lungs: clear to auscultation bilaterally, normal percussion bilaterally and non-labored Heart: regular rate and rhythm, S1 and S2 normal, no murmur, click, rub or gallop ; nondisplaced PMI. Occasional ectopy Abdomen: soft, non-tender; bowel sounds normal; no masses,  no organomegaly; no HJR Extremities: extremities normal, atraumatic, no cyanosis, or edema  Pulses: 2+ and symmetric;  Skin: mobility and turgor normal, no edema, no evidence of bleeding or bruising and no lesions noted or  Neurologic: Mental status: Alert, oriented, thought content appropriate Cranial nerves: normal (II-XII grossly intact)    Adult ECG Report  Rate: 56 ;  Rhythm: sinus bradycardia and 1 AVB. (PR interval 264). Otherwise normal axis, intervals and durations. No  ischemic ST and T-wave changes.;   Narrative Interpretation: Borderline EKG   Other studies Reviewed: Additional studies/ records that were reviewed today include:  Recent Labs:  Lipids not available  - Reviewed in care everywhere: Hemoglobin A1c 7.1 (relatively stable). Glucose 132.  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 He has been leery of doing much since that episode and was somewhat concerned. As far as can tell he is not had a recent ischemic evaluation, perhaps since his last relook cath. Given his history of white, site cardiac and PCI. I think is reasonable to evaluate his chest pain with a Myoview stress test.  Plan:   Continue aspirin and Plavix along with propranolol and Crestor.  Myoview stress test for chest pain      Relevant Orders   EKG 12-Lead (Completed)   Myocardial Perfusion Imaging   Cardiac device in situ    He still has the loop recorder in place, I just don't think that his total recording. I question whether he needs to still have one in. Do we consider the possibility of ICD. A lot of this will depend on the results was Myoview.  I have asked him to be seen by Dr. Curt Bears to help out with management of this        Chest pain with moderate risk for cardiac etiology - Primary    In a gentleman with two-vessel PCI is not had any chest pain since his MI, he had an episode of exertional chest pain relieved with stress. This hasn't quite concerned. Plan: Lexiscan Myoview to evaluate for ischemia. Obtained old records of echo, Myoview as well as cath reports.      Relevant Orders   EKG 12-Lead (Completed)   Myocardial  Perfusion Imaging   History of acute anterior wall MI    Occluded LAD, by his report, his physician told him that he had stabilized pump function, but clearly had damage. We  will need to see his cath report as well as echo and Myoview results from previous evaluations. Since he has had some white, tachycardia, I'm worried with a history of anterior MI if this could be scar related. -- We are ordering a Myoview now, would like to have the old records to compare. He has not had any heart failure symptoms. Excellent on stable regimen for now.      Relevant Orders   EKG 12-Lead (Completed)   Myocardial Perfusion Imaging   Hyperlipidemia LDL goal <70 (Chronic)    Pretty close to goal based on his labs in August. He is on high-dose statin. No myalgias.      Palpitations    Intermittent palpitations but not consistent with Y complex tachycardia. Relatively controlled on propranolol.      Type 2 diabetes mellitus without complication, without long-term current use of insulin (HCC)    Hemoglobin A1c is above goal. With his cardiac history of MI/CAD, I think I would be more aggressive with trying to get his Hemoglobin A1c down below 6.0.       Wide-complex tachycardia (HCC) (Chronic)    From reviewing his old notes, it appears he did have an episode of white, cycle cardiac noted on loop recorder. I think his loop recorder is currently no longer functional. He does still have "weakness spells, but has not had any recurrent syncope. He is currently on flecainide and propranolol and it seems to have these episodes under control. With his history of anterior MI we are checking a Myoview given his recent chest pain. During his old records. I would like for him to be seen by Dr. Curt Bears from Electrophysiology in order to determine whether or not we need to consider a new loop recorder since his current recorder is outdated. His management before had been replacing the loop recorder for monitoring  purposes. For now since he stable I will continue the current beta blocker and flecainide, but will defer to Dr. Curt Bears for further management.      Relevant Orders   EKG 12-Lead (Completed)   Myocardial Perfusion Imaging   Ambulatory referral to Cardiology      Current medicines are reviewed at length with the patient today. (+/- concerns) None The following changes have been made: None  Patient Instructions  Schedule [address] Your physician has requested that you have a lexiscan myoview. For further information please visit [email or web address]. Please follow instruction sheet, as given.    You have been referred to Dr Curt Bears  ( EP)  [place].   Your physician wants you to follow-up in 2 MONTHS WITH DR Zachary Nole.    If you need a refill on your cardiac medications before your next appointment, please call your pharmacy.   Studies Ordered:   Orders Placed This Encounter  Procedures  . Ambulatory referral to Cardiology  . Myocardial Perfusion Imaging  . EKG 12-Lead      [name], M.D., M.S. Interventional Cardiologist   Pager # [phone] Phone # [phone] [address] [place],  [place] [address]

## 2016-12-09 ENCOUNTER — Telehealth: Payer: Self-pay | Admitting: Cardiology

## 2016-12-09 ENCOUNTER — Encounter: Payer: Self-pay | Admitting: Cardiology

## 2016-12-09 DIAGNOSIS — R000 Tachycardia, unspecified: Secondary | ICD-10-CM | POA: Insufficient documentation

## 2016-12-09 DIAGNOSIS — N3941 Urge incontinence: Secondary | ICD-10-CM | POA: Insufficient documentation

## 2016-12-09 DIAGNOSIS — I472 Ventricular tachycardia: Secondary | ICD-10-CM | POA: Insufficient documentation

## 2016-12-09 DIAGNOSIS — R079 Chest pain, unspecified: Secondary | ICD-10-CM | POA: Insufficient documentation

## 2016-12-09 NOTE — Telephone Encounter (Signed)
Faxed Release signed by patient to [facility] to obtain records per Dr [name].  Faxed 12/09/16

## 2016-12-09 NOTE — Assessment & Plan Note (Signed)
Really had been doing fine up until his episode in February when he had some exertional chest discomfort. He has been leery of doing much since that episode and was somewhat concerned. As far as can tell he is not had a recent ischemic evaluation, perhaps since his last relook cath. Given his history of white, site cardiac and PCI. I think is reasonable to evaluate his chest pain with a Myoview stress test.  Plan:   Continue aspirin and Plavix along with propranolol and Crestor.  Myoview stress test for chest pain

## 2016-12-09 NOTE — Assessment & Plan Note (Signed)
In a gentleman with two-vessel PCI is not had any chest pain since his MI, he had an episode of exertional chest pain relieved with stress. This hasn't quite concerned. Plan: Lexiscan Myoview to evaluate for ischemia. Obtained old records of echo, Myoview as well as cath reports.

## 2016-12-09 NOTE — Assessment & Plan Note (Signed)
No recurrent spells. I don't think there were any recently recorded by complexes on his loop recorder from one is still was functional. He still has "near syncopal type spells with weakness, but is unaware of any palpitations. This would be consistent possibly with a white count was tachycardia, currently on flecainide and foraminal all with no recurrence of syncopal episodes.   We are doing to his old records from [facility] recorder and EP study. He will follow-up with Dr. Curt Bears from EP.

## 2016-12-09 NOTE — Assessment & Plan Note (Signed)
Hemoglobin A1c is above goal. With his cardiac history of MI/CAD, I think I would be more aggressive with trying to get his Hemoglobin A1c down below 6.0.

## 2016-12-09 NOTE — Assessment & Plan Note (Signed)
From reviewing his old notes, it appears he did have an episode of white, cycle cardiac noted on loop recorder. I think his loop recorder is currently no longer functional. He does still have "weakness spells, but has not had any recurrent syncope. He is currently on flecainide and propranolol and it seems to have these episodes under control. With his history of anterior MI we are checking a Myoview given his recent chest pain. During his old records. I would like for him to be seen by Dr. Curt Bears from Electrophysiology in order to determine whether or not we need to consider a new loop recorder since his current recorder is outdated. His management before had been replacing the loop recorder for monitoring purposes. For now since he stable I will continue the current beta blocker and flecainide, but will defer to Dr. Curt Bears for further management.

## 2016-12-09 NOTE — Assessment & Plan Note (Signed)
He still has the loop recorder in place, I just don't think that his total recording. I question whether he needs to still have one in. Do we consider the possibility of ICD. A lot of this will depend on the results was Myoview.  I have asked him to be seen by Dr. Curt Bears to help out with management of this

## 2016-12-09 NOTE — Assessment & Plan Note (Signed)
Occluded LAD, by his report, his physician told him that he had stabilized pump function, but clearly had damage. We will need to see his cath report as well as echo and Myoview results from previous evaluations. Since he has had some white, tachycardia, I'm worried with a history of anterior MI if this could be scar related. -- We are ordering a Myoview now, would like to have the old records to compare. He has not had any heart failure symptoms. Excellent on stable regimen for now.

## 2016-12-09 NOTE — Assessment & Plan Note (Signed)
Intermittent palpitations but not consistent with Y complex tachycardia. Relatively controlled on propranolol.

## 2016-12-09 NOTE — Assessment & Plan Note (Signed)
Pretty close to goal based on his labs in August. He is on high-dose statin. No myalgias.

## 2016-12-17 ENCOUNTER — Telehealth (HOSPITAL_COMMUNITY): Payer: Self-pay

## 2016-12-17 NOTE — Telephone Encounter (Signed)
Encounter complete. 

## 2016-12-22 ENCOUNTER — Ambulatory Visit (HOSPITAL_COMMUNITY)
Admission: RE | Admit: 2016-12-22 | Discharge: 2016-12-22 | Disposition: A | Discharge status: Home / Self Care | Payer: Medicare Other | Source: Ambulatory Visit | Attending: Cardiovascular Disease | Admitting: Cardiovascular Disease

## 2016-12-22 DIAGNOSIS — R9439 Abnormal result of other cardiovascular function study: Secondary | ICD-10-CM | POA: Insufficient documentation

## 2016-12-22 DIAGNOSIS — I472 Ventricular tachycardia: Secondary | ICD-10-CM

## 2016-12-22 DIAGNOSIS — R079 Chest pain, unspecified: Secondary | ICD-10-CM

## 2016-12-22 DIAGNOSIS — I259 Chronic ischemic heart disease, unspecified: Secondary | ICD-10-CM | POA: Diagnosis not present

## 2016-12-22 DIAGNOSIS — I252 Old myocardial infarction: Secondary | ICD-10-CM

## 2016-12-22 DIAGNOSIS — R000 Tachycardia, unspecified: Secondary | ICD-10-CM

## 2016-12-22 DIAGNOSIS — I25119 Atherosclerotic heart disease of native coronary artery with unspecified angina pectoris: Secondary | ICD-10-CM

## 2016-12-22 HISTORY — PX: NM MYOVIEW LTD: HXRAD82

## 2016-12-22 LAB — MYOCARDIAL PERFUSION IMAGING
LV dias vol: 88 mL (ref 62–150)
LV sys vol: 37 mL
Peak HR: 79 {beats}/min
Rest HR: 57 {beats}/min
SDS: 10
SRS: 3
SSS: 13
TID: 1.03

## 2016-12-22 MED ORDER — TECHNETIUM TC 99M TETROFOSMIN IV KIT
10.4000 | PACK | Freq: Once | INTRAVENOUS | Status: AC | PRN
  Administered 2016-12-22: 10.4 via INTRAVENOUS
  Filled 2016-12-22: qty 11

## 2016-12-22 MED ORDER — AMINOPHYLLINE 25 MG/ML IV SOLN
75.0000 mg | Freq: Once | INTRAVENOUS | Status: AC
  Administered 2016-12-22: 75 mg via INTRAVENOUS

## 2016-12-22 MED ORDER — TECHNETIUM TC 99M TETROFOSMIN IV KIT
31.1000 | PACK | Freq: Once | INTRAVENOUS | Status: AC | PRN
  Administered 2016-12-22: 31.1 via INTRAVENOUS
  Filled 2016-12-22: qty 32

## 2016-12-22 MED ORDER — REGADENOSON 0.4 MG/5ML IV SOLN
0.4000 mg | Freq: Once | INTRAVENOUS | Status: AC
  Administered 2016-12-22: 0.4 mg via INTRAVENOUS

## 2016-12-30 ENCOUNTER — Telehealth: Payer: Self-pay | Admitting: Cardiology

## 2016-12-30 NOTE — Telephone Encounter (Signed)
New message     Pt was returning call about Stress test results

## 2016-12-30 NOTE — Telephone Encounter (Signed)
Spoke to wife results given,  received previous report from  [facility] for Dr [name] TO REVIEW Will contact once a decision is made  wife verbalized understanding

## 2017-01-04 ENCOUNTER — Encounter (INDEPENDENT_AMBULATORY_CARE_PROVIDER_SITE_OTHER): Payer: Self-pay

## 2017-01-04 ENCOUNTER — Ambulatory Visit (INDEPENDENT_AMBULATORY_CARE_PROVIDER_SITE_OTHER): Payer: Medicare Other | Admitting: Cardiology

## 2017-01-04 ENCOUNTER — Encounter: Payer: Self-pay | Admitting: Cardiology

## 2017-01-04 VITALS — BP 92/66 | HR 66 | Ht 72.0 in | Wt 207.8 lb

## 2017-01-04 DIAGNOSIS — I472 Ventricular tachycardia, unspecified: Secondary | ICD-10-CM

## 2017-01-04 NOTE — Patient Instructions (Signed)
Medication Instructions:    Your physician recommends that you continue on your current medications as directed. Please refer to the Current Medication list given to you today.  - If you need a refill on your cardiac medications before your next appointment, please call your pharmacy.   Labwork:  None ordered  Testing/Procedures:  None ordered  Follow-Up:  Your physician recommends that you schedule a follow-up appointment in: 3 months with Dr. Camnitz.  Thank you for choosing [facility]!!   Vannessa Godown, RN [phone]         

## 2017-01-04 NOTE — Progress Notes (Signed)
Electrophysiology Office Note   Date:  01/04/2017   ID:  Alexander Armstrong, DOB 04-09-1941, MRN [id number]  PCP:  Yong Channel, MD  Cardiologist:  [name] Primary Electrophysiologist:  Felesia Stahlecker [name], MD    Chief Complaint  Patient presents with  . Palpitations     History of Present Illness: Alexander Armstrong is a 76 y.o. male who presents today for electrophysiology evaluation.   She has a history of STEMI status post angioplasty to the mid LAD in 2006 with a staged intervention to the RCA the same year, syncope, hyperlipidemia, wide-complex tachycardia on flecainide unable to induce VT at EP study. During his evaluation for syncope, a Linq was inserted and a wide complex tachycardia was seen. He was started on flecainide 50 mg a day. EP study in 2014 showed a normal HV interval with no inducible arrhythmias at baseline or with 28 g/m of Isuprel. A nonsustained long RP atrial tachycardia was induced. Prior to starting flecainide, he complained of intermittent episodes of palpitations dizziness and lightheadedness. At times she also was presyncopal. The cycle length of the wide complex tachycardia was 220 ms.   Today, he denies symptoms of palpitations, chest pain, shortness of breath, orthopnea, PND, lower extremity edema, claudication, dizziness, presyncope, syncope, bleeding, or neurologic sequela. The patient is tolerating medications without difficulties and is otherwise without complaint today.    Past Medical History:  Diagnosis Date  . Allergy   . Arthritis   . 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 basal cell skin cancer   . CAD S/P percutaneous coronary angioplasty 11/03/2004   a. mLAD - Taxus DES 3.0 x 20; b. (11/06/04) staged PCI to RCA Taxus DES 3.5 x 16  . Cardiac syncope    No episode since starting flecainide. Had documented WIDE COMPLEX Tachycardia on loop recorder.  . Cataract bilateral cataracts  . Diverticulosis   . Esophageal stricture   . GERD (gastroesophageal  reflux disease)   . Hiatal hernia 11/05/2010  . HLD (hyperlipidemia)   . Nonsustained paroxysmal ventricular tachycardia ([place]) 2014   Presumably controlled with flecainide. Unable to induce during EP study.  . Reflux gastritis   . STEMI involving left anterior descending coronary artery ([place]) 11/03/2004   [facility]: Occluded LAD treated with DES. Staged PCI to the RCA.   Past Surgical History:  Procedure Laterality Date  . CARDIAC CATHETERIZATION  ~2011   [facility]: re-look Cath - patent stents  . ELECTROPHYSIOLOGIC STUDY  2014   Unable to stimulate VT seen on loop recorder  . implanted loop heart monitor x2  ~2011; 2014   Per report- batter out of date; apparently captured a prolonged episode of wide complex tachycardia associated with an episode of weakness/near syncope  . PERCUTANEOUS CORONARY STENT INTERVENTION (PCI-S)  January-February 2006   a. mLAD - Taxus DES 3.0 x 20; b. (11/06/04) staged PCI to RCA Taxus DES 3.5 x 16  . UMBILICAL HERNIA REPAIR       Current Outpatient Prescriptions  Medication Sig Dispense Refill  . aspirin 81 MG tablet Take 81 mg by mouth daily.    . cetirizine (ZYRTEC) 10 MG tablet Take 10 mg by mouth as needed.    . Cholecalciferol (VITAMIN D) 2000 UNITS CAPS Take 1 capsule by mouth daily.    . clopidogrel (PLAVIX) 75 MG tablet Take 75 mg by mouth daily.    . fexofenadine (ALLEGRA) 180 MG tablet Take 180 mg by mouth daily.    . flecainide (TAMBOCOR) 50  MG tablet Take 50 mg by mouth 2 (two) times daily.     [name] ipratropium (ATROVENT) 0.03 % nasal spray Place 2 sprays into the nose as needed for rhinitis.     [name] oxybutynin (DITROPAN) 5 MG tablet Take 5 mg by mouth 2 (two) times daily.  3  . pantoprazole (PROTONIX) 40 MG tablet Take 40 mg by mouth daily.    . propranolol (INDERAL) 20 MG tablet Take 20 mg by mouth 2 (two) times daily.    . rosuvastatin (CRESTOR) 20 MG tablet Take 20 mg by mouth daily.    . tamsulosin (FLOMAX) 0.4 MG CAPS capsule Take  0.4 mg by mouth daily after supper.      No current facility-administered medications for this visit.     Allergies:   Silver sulfadiazine and Hydrocodone   Social History:  The patient  reports that he has never smoked. He has never used smokeless tobacco. He reports that he does not drink alcohol or use drugs.   Family History:  The patient's family history includes Cancer in his father; Glaucoma in his paternal aunt; Heart attack in his brother; Heart disease in his mother; Macular degeneration in his father; Multiple myeloma in his mother; Prostate cancer in his father; Uterine cancer in his mother.    ROS:  Please see the history of present illness.   Otherwise, review of systems is positive for dyspnea on exertion, back pain, rash, dizziness, easy bruising.   All other systems are reviewed and negative.    PHYSICAL EXAM: VS:  BP 92/66 (BP Location: Left Arm, Patient Position: Sitting, Cuff Size: Normal)   Pulse 66   Ht 6' (1.829 m)   Wt 207 lb 12.8 oz (94.3 kg)   SpO2 94%   BMI 28.18 kg/m  , BMI Body mass index is 28.18 kg/m. GEN: Well nourished, well developed, in no acute distress  HEENT: normal  Neck: no JVD, carotid bruits, or masses Cardiac: RRR; no murmurs, rubs, or gallops,no edema  Respiratory:  clear to auscultation bilaterally, normal work of breathing GI: soft, nontender, nondistended, + BS MS: no deformity or atrophy  Skin: warm and dry,  device pocket is well healed Neuro:  Strength and sensation are intact Psych: euthymic mood, full affect  EKG:  EKG is not ordered today. Personal review of the ekg ordered 12/08/16 shows sinus rhythm, first-degree AV block  Recent Labs: No results found for requested labs within last 8760 hours.    Lipid Panel  No results found for: CHOL, TRIG, HDL, CHOLHDL, VLDL, LDLCALC, LDLDIRECT   Wt Readings from Last 3 Encounters:  01/04/17 207 lb 12.8 oz (94.3 kg)  12/22/16 207 lb (93.9 kg)  12/08/16 207 lb (93.9 kg)       Other studies Reviewed: Additional studies/ records that were reviewed today include: SPECT 12/22/16  Review of the above records today demonstrates:   The left ventricular ejection fraction is normal (55-65%).  Nuclear stress EF: 58%.  Defect 1: There is a medium defect of severe severity present in the mid anterior, apical anterior and apex location consistent with a previous anterior apical MI  Defect 2: There is a large defect of moderate severity present in the basal inferoseptal, basal inferior, mid inferoseptal, mid inferior and apical inferior location that is c/w inferior ischemia.  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  This is an intermediate risk study.    ASSESSMENT AND PLAN:  1.  Coronary artery disease  status post RCA and LAD interventions: Recent stress test with ischemia ordered for chest pain. Care per Dr. [name].  2. Wide complex tachycardia: Recently assumed to be ventricular tachycardia. Seen on Linq monitoring in 2014. Negative EP study for ventricular tachycardia. Has been on flecainide since that time. I am concerned about the fact that he is on flecainide with coronary artery disease status post 2 stents. We did have a lengthy discussion on the risks and benefits of flecainide with coronary artery disease. He would like to think about stopping flecainide and restarting a separate medication. I have also spoken with him about implantation of a third Linq monitor. I have told him that it is unlikely to change our medical management, as we know what abnormal rhythm he has at this point. He Louanne Calvillo think about this and discuss it with his family.    Current medicines are reviewed at length with the patient today.   The patient does not have concerns regarding his medicines.  The following changes were made today:  none  Labs/ tests ordered today include:  No orders of the defined types were placed in this  encounter.    Disposition:   FU with Addisynn Vassell 3 months  Signed, Alyza Artiaga [name], MD  01/04/2017 2:25 PM     [place] [place] [address] [phone] (office) [phone] (fax)

## 2017-01-07 ENCOUNTER — Encounter: Payer: Self-pay | Admitting: Cardiology

## 2017-01-12 ENCOUNTER — Ambulatory Visit (INDEPENDENT_AMBULATORY_CARE_PROVIDER_SITE_OTHER): Payer: Medicare Other | Admitting: Cardiology

## 2017-01-12 VITALS — BP 106/63 | HR 53 | Ht 71.0 in | Wt 207.6 lb

## 2017-01-12 DIAGNOSIS — I209 Angina pectoris, unspecified: Secondary | ICD-10-CM

## 2017-01-12 DIAGNOSIS — R55 Syncope and collapse: Secondary | ICD-10-CM | POA: Diagnosis not present

## 2017-01-12 DIAGNOSIS — D689 Coagulation defect, unspecified: Secondary | ICD-10-CM

## 2017-01-12 DIAGNOSIS — I25119 Atherosclerotic heart disease of native coronary artery with unspecified angina pectoris: Secondary | ICD-10-CM | POA: Diagnosis not present

## 2017-01-12 DIAGNOSIS — Z01818 Encounter for other preprocedural examination: Secondary | ICD-10-CM

## 2017-01-12 DIAGNOSIS — E785 Hyperlipidemia, unspecified: Secondary | ICD-10-CM

## 2017-01-12 DIAGNOSIS — R000 Tachycardia, unspecified: Secondary | ICD-10-CM

## 2017-01-12 DIAGNOSIS — R9439 Abnormal result of other cardiovascular function study: Secondary | ICD-10-CM | POA: Insufficient documentation

## 2017-01-12 DIAGNOSIS — I472 Ventricular tachycardia: Secondary | ICD-10-CM | POA: Diagnosis not present

## 2017-01-12 NOTE — Patient Instructions (Addendum)
SCHEDULE  NEXT WEEK WITH DR [name] AFTER BACK INJECTION Your physician has requested that you have a LEFT HEART cardiac catheterization ( RIGHT RADIAL). Cardiac catheterization is used to diagnose and/or treat various heart conditions. Doctors may recommend this procedure for a number of different reasons. The most common reason is to evaluate chest pain. Chest pain can be a symptom of coronary artery disease (CAD), and cardiac catheterization can show whether plaque is narrowing or blocking your heart's arteries. This procedure is also used to evaluate the valves, as well as measure the blood flow and oxygen levels in different parts of your heart. For further information please visit [email or web address]. Please follow instruction sheet, as given.    NEED LABS FOR CATH = CBC, PT, BMP NO CHEST XRAY NEEDED    BACK INJECTIONS  OKAY TO PLAVIX  5 TO 7 DAYS PRIOR TO INJECTIONS.  Your physician recommends that you schedule a follow-up appointment in 2 WEEKS AFTER CATH.     [facility] [address] [place] [address] Dept: [phone] Loc: [phone]  [facility] Life  01/12/2017  You are scheduled for a Cardiac Catheterization on ,          with Dr. [name].  1. Please arrive at the [facility] (Main Entrance A) at [facility]: [address] [place], [place] [address] at  (two hours before your procedure to ensure your preparation). Free valet parking service is available.   Special note: Every effort is made to have your procedure done on time. Please understand that emergencies sometimes delay scheduled procedures.  2. Diet: Do not eat or drink anything after midnight prior to your procedure except sips of water to take medications.  3. Labs: cbc, BMP ,PT  4. Medication instructions in preparation for your procedure:     On the morning of your procedure, take your  Plavix/Clopidogrel and aspirin and any morning medicines NOT listed above.  You may use sips of water.  5. Plan for one night stay--bring personal belongings. 6. Bring a current list of your medications and current insurance cards. 7. You MUST have a responsible person to drive you home. 8. Someone MUST be with you the first 24 hours after you arrive home or your discharge will be delayed. 9. Please wear clothes that are easy to get on and off and wear slip-on shoes.  Thank you for allowing [place] to care for you!   -- [facility]

## 2017-01-12 NOTE — Progress Notes (Signed)
PCP: Yong Channel, MD  Clinic Note: Chief Complaint  Patient presents with  . Follow-up    Abnormal Myoview  . Coronary Artery Disease    HPI: Alexander Armstrong is a 76 y.o. male with a PMH below who presents today for 1 month follow-up to discuss Myoview results. I last saw him in March this year to establish cardiology care. 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 He was previously followed by Dr. Elonda Husky at [facility]. - He had had a prolonged episode of chest pain prior to his initial visit with me and I therefore ordered a Myoview stress test. I saw him, I referred him to Dr. Curt Bears from EP to discuss loop recorder and history of DVT.  [name] was last seen on April 2 --> she discuss potential stopping flecainide in starting something else. He also talked about pros and cons of reimplantation of a LINQ loop recorder -- he felt that it would unlikely change management and therefore would probably not benefit from before. He has been discussed with the family.  Recent Hospitalizations: None  Studies Reviewed:   Myoview from previous cardiologist showed no evidence of ischemia or infarction. EF 66% (October 2015) - report reviewed and scanned in chart  Myoview ([facility]) 12/22/2016 - images independently reviewed: EF 58%. INTERMEDIATE RISK study. Medium size severe intensity defect in the mid anterior-apical anterior and apical location consistent with prior infarct (nonreversible).  Also noted large sized moderate severity partially reversible defect in the basal inferoseptal, basal inferior and inferoseptal as well as mid inferior apical inferior wall consistent with ischemia.  Interval History: Alexander Armstrong returns today to discuss results of his stress test. He actually has been doing pretty well since I last saw him,  but his daughter indicates that these may be been slowing up a little bit over last few weeks. He will admit that maybe he is been a little more short of breath with exertion than usual over his baseline over the last few weeks but has not had any chest pain or pressure. Nothing that is been alarming to him. He is not had any prolonged dizzy spells or weakness spells but is having couple short bursts - these episodes usually happen when he is at rest, not walking He tends to concur with the concept of not replacing the loop recorder, but his wife is not quite sold on this.  He has not had any PND, orthopnea or edema. No sig. Near-syncope, TIA or amaurosis fugax. No melena, hematochezia or hematuria. No claudication.   He is not had an episode of discomfort in his chest like he had a few weeks before I last saw him. However since that he thinks he may be just noticing a little bit more shortness of breath with activity and has been more easily fatigued  ROS: A comprehensive was performed. Review of Systems  HENT: Negative for nosebleeds.   Respiratory: Negative for cough, sputum production and wheezing.   Cardiovascular:       Per history of present illness  Gastrointestinal: Negative for blood in stool.  Genitourinary: Negative for hematuria.  Musculoskeletal: Negative for joint pain.  Neurological: Positive for dizziness.       Still has brief summary weakness spells, short lived  Endo/Heme/Allergies: Does not bruise/bleed easily.  Psychiatric/Behavioral: Negative for  memory loss. The patient is not nervous/anxious and does not have insomnia.   All other systems reviewed and are negative.   Past Medical History:  Diagnosis Date  . Allergy   . Arthritis   . 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 basal cell skin cancer   . CAD S/P percutaneous coronary angioplasty 11/03/2004   a. mLAD - Taxus DES 3.0 x 20; b. (11/06/04) staged PCI to RCA Taxus DES 3.5 x 16  . Cardiac syncope    No  episode since starting flecainide. Had documented WIDE COMPLEX Tachycardia on loop recorder.  . Cataract bilateral cataracts  . Diverticulosis   . Esophageal stricture   . GERD (gastroesophageal reflux disease)   . Hiatal hernia 11/05/2010  . HLD (hyperlipidemia)   . Nonsustained paroxysmal ventricular tachycardia ([place]) 2014   Presumably controlled with flecainide. Unable to induce during EP study.  . Reflux gastritis   . STEMI involving left anterior descending coronary artery ([place]) 11/03/2004   [facility]: Occluded LAD treated with DES. Staged PCI to the RCA.    Past Surgical History:  Procedure Laterality Date  . CARDIAC CATHETERIZATION  ~2011   [facility]: re-look Cath - patent stents  . ELECTROPHYSIOLOGIC STUDY  2014   Unable to stimulate VT seen on loop recorder  . implanted loop heart monitor x2  ~2011; 2014   Per report- batter out of date; apparently captured a prolonged episode of wide complex tachycardia associated with an episode of weakness/near syncope  . NM MYOVIEW LTD  07/2014   Unremarkable EKG. No evidence of ischemia or infarct. Mild anterior attenuation, cannot exclude prior infarct, but nonreversible. EF 66%.  [name] NM MYOVIEW LTD  12/22/2016   INTERMEDIATE RISK. EF 58%. Defect 1: Medium size severe defect in the mid anterior, apical anterior and apical wall consistent with prior anterior MI. Defect to: Large defect and moderate severity in the basal inferoseptal basal inferior and inferoseptal mid inferior wall consistent with ischemia.  [name] PERCUTANEOUS CORONARY STENT INTERVENTION (PCI-S)  January-February 2006   a. mLAD - Taxus DES 3.0 x 20; b. (11/06/04) staged PCI to RCA Taxus DES 3.5 x 16  . UMBILICAL HERNIA REPAIR      Current Meds  Medication Sig  . aspirin 81 MG tablet Take 81 mg by mouth daily.  . cetirizine (ZYRTEC) 10 MG tablet Take 10 mg by mouth as needed.  . Cholecalciferol (VITAMIN D) 2000 UNITS CAPS Take 1 capsule by mouth daily.  .  clopidogrel (PLAVIX) 75 MG tablet Take 75 mg by mouth daily.  . fexofenadine (ALLEGRA) 180 MG tablet Take 180 mg by mouth daily.  . flecainide (TAMBOCOR) 50 MG tablet Take 50 mg by mouth 2 (two) times daily.   [name] ipratropium (ATROVENT) 0.03 % nasal spray Place 2 sprays into the nose as needed for rhinitis.   [name] oxybutynin (DITROPAN) 5 MG tablet Take 5 mg by mouth 2 (two) times daily.  . pantoprazole (PROTONIX) 40 MG tablet Take 40 mg by mouth daily.  . propranolol (INDERAL) 20 MG tablet Take 20 mg by mouth 2 (two) times daily.  . rosuvastatin (CRESTOR) 20 MG tablet Take 20 mg by mouth daily.  . tamsulosin (FLOMAX) 0.4 MG CAPS capsule Take 0.4 mg by mouth daily after supper.     Allergies  Allergen Reactions  . Silver Sulfadiazine Rash and Hives  . Hydrocodone     rash rash    Social History   Social History  . Marital status:  Married    Spouse name: N/A  . Number of children: 1  . Years of education: N/A   Occupational History  . furniture Retired   Social History Main Topics  . Smoking status: Never Smoker  . Smokeless tobacco: Never Used  . Alcohol use No  . Drug use: No  . Sexual activity: Not Asked   Other Topics Concern  . None   Social History Narrative  . None    family history includes Cancer in his father; Glaucoma in his paternal aunt; Heart attack in his brother; Heart disease in his mother; Macular degeneration in his father; Multiple myeloma in his mother; Prostate cancer in his father; Uterine cancer in his mother.  Wt Readings from Last 3 Encounters:  01/12/17 207 lb 9.6 oz (94.2 kg)  01/04/17 207 lb 12.8 oz (94.3 kg)  12/22/16 207 lb (93.9 kg)    PHYSICAL EXAM BP 106/63   Pulse (!) 53   Ht 5' 11" (1.803 m)   Wt 207 lb 9.6 oz (94.2 kg)   SpO2 96%   BMI 28.95 kg/m  General appearance: alert, cooperative, appears stated age, no distress.  Well-nourished, well-groomed. Healthy-appearing Neck: no adenopathy, no carotid bruit and no JVD HEENT:  [place]/AT, EOMI, MMM, anicteric sclera Lungs: CTAB, normal percussion bilaterally and non-labored Heart: RRR, normal S1& S2.  No M/R/G.  Non-displaced PMI.  Occasional ectopy.  Abdomen: soft, NT/ND/NABS.  No HSM no HJR Extremities: extremities normal, atraumatic, no cyanosis, or edema  Pulses: 2+ and symmetric;  Skin: mobility and turgor normal, no edema and no evidence of bleeding or bruising Neurologic: Mental status: Alert, oriented, thought content appropriate    Adult ECG Report n/a  Other studies Reviewed: Additional studies/ records that were reviewed today include:  Recent Labs:  n/a - followed by PCP  ASSESSMENT / PLAN: Problem List Items Addressed This Visit    Abnormal nuclear stress test    Somewhat concerning Myoview result with what appears to be 2 separate perfusion defects. I actually see the inferior defect is being more fixed than the anterior. He has had stents in both the LAD and the RCA which is concerning. The enhancing part is that he doesn't seem to be overly symptomatic. The main symptom is that he just seems to be more fatigued over last few weeks. Despite his relative lack of symptoms, the stress test is concerning enough that I think it warrants proceeding with cardiac catheterization. I reviewed the images with the patient and his daughter in and they agreed that it is definitely an abnormal appearing study and that invasive evaluation would be warranted.  Plan: Schedule for Left Heart Catheterization with Coronary Angiography and Possible PCI CC is not having symptoms, I think is probably best friend take go ahead and have his back injection done before we do his heart. While this would be counterintuitive for him to stop his Plavix, I would rather him stop his Plavix now to have a back surgery and do with the ramifications later than taken to the Cath Lab and find a significant lesion that would require maintenance on Plavix.  For now would continue aspirin and  Plavix as well as beta blocker and statin.  Risks, benefits, alternatives and indications the procedure were explained in detail. He is familiar with a heart catheterization procedure. He agrees to proceed.      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 Unfortunately his stress test is quite abnormal.  Plan: Schedule for cardiac catheterization with possible PCI. Continue aspirin and Plavix. Continue blocker and statin. The presence of potential active coronary disease would make it a good option for [place] to consider something besides flecainide for VT control.      Relevant Orders   CBC   Basic metabolic panel   Protime-INR   LEFT HEART CATHETERIZATION WITH CORONARY ANGIOGRAM   Cardiac syncope (Chronic)   Relevant Orders   CBC   LEFT HEART CATHETERIZATION WITH CORONARY ANGIOGRAM   Hyperlipidemia LDL goal <70 (Chronic)   Relevant Orders   CBC   Wide-complex tachycardia (HCC) (Chronic)    Seen by Dr. Curt Bears. I think the plan for now is to continue beta blocker, and discussed potentially switching from flecainide to something else. I don't think the plan is to go forward with a redo loop recorder.  - Much of these concerns will be affected by his cardiac catheterization results.      Relevant Orders   CBC    Other Visit Diagnoses    Pre-op testing       Relevant Orders   CBC   LEFT HEART CATHETERIZATION WITH CORONARY ANGIOGRAM   Clotting disorder ([place])       Relevant Orders   Protime-INR      Current medicines are reviewed at length with the patient today. (+/- concerns) n/a The following changes have been made: n/a  Patient Instructions  [place] INJECTION Your  physician has requested that you have a LEFT HEART cardiac catheterization ( RIGHT RADIAL). Cardiac catheterization is used to diagnose and/or treat various heart conditions. Doctors may recommend this procedure for a number of different reasons. The most common reason is to evaluate chest pain. Chest pain can be a symptom of coronary artery disease (CAD), and cardiac catheterization can show whether plaque is narrowing or blocking your heart's arteries. This procedure is also used to evaluate the valves, as well as measure the blood flow and oxygen levels in different parts of your heart. For further information please visit [email or web address]. Please follow instruction sheet, as given.    NEED LABS FOR CATH = CBC, PT, BMP NO CHEST XRAY NEEDED    BACK INJECTIONS  OKAY TO PLAVIX  5 TO 7 DAYS PRIOR TO INJECTIONS.  Your physician recommends that you schedule a follow-up appointment in 2 WEEKS AFTER CATH.     [facility] [address] [place] [address] Dept: [phone] Loc: [phone]  Pharoah Goggins  01/12/2017  You are scheduled for a Cardiac Catheterization on ,          with Dr. [name].  1. Please arrive at the [facility] (Main Entrance A) at [facility]: [address] [place], [place] [address] at  (two hours before your procedure to ensure your preparation). Free valet parking service is available.   Special note: Every effort is made to have your procedure done on time. Please understand that emergencies sometimes delay scheduled procedures.  2. Diet: Do not eat or drink anything after midnight prior to your procedure except sips of water to take medications.  3. Labs: cbc, BMP ,PT  4. Medication instructions in  preparation for your procedure:     On the morning of your procedure, take your Plavix/Clopidogrel and aspirin and any morning medicines NOT listed above.   You may use sips of water.  5. Plan for one night stay--bring personal belongings. 6. Bring a current list of your medications and current insurance cards. 7. You MUST have a responsible person to drive you home. 8. Someone MUST be with you the first 24 hours after you arrive home or your discharge will be delayed. 9. Please wear clothes that are easy to get on and off and wear slip-on shoes.  Thank you for allowing [place] to care for you!   -- [facility]     Studies Ordered:   Orders Placed This Encounter  Procedures  . CBC  . Basic metabolic panel  . Protime-INR  . LEFT HEART CATHETERIZATION WITH CORONARY Illene Silver, M.D., M.S. Interventional Cardiologist   Pager # [phone] Phone # [phone] [address] [place], [place] [address]

## 2017-01-14 ENCOUNTER — Encounter: Payer: Self-pay | Admitting: Cardiology

## 2017-01-14 NOTE — Assessment & Plan Note (Addendum)
Somewhat concerning Myoview result with what appears to be 2 separate perfusion defects. I actually see the inferior defect is being more fixed than the anterior. He has had stents in both the LAD and the RCA which is concerning. The enhancing part is that he doesn't seem to be overly symptomatic. The main symptom is that he just seems to be more fatigued over last few weeks. Despite his relative lack of symptoms, the stress test is concerning enough that I think it warrants proceeding with cardiac catheterization. I reviewed the images with the patient and his daughter in and they agreed that it is definitely an abnormal appearing study and that invasive evaluation would be warranted.  Plan: Schedule for Left Heart Catheterization with Coronary Angiography and Possible PCI CC is not having symptoms, I think is probably best friend take go ahead and have his back injection done before we do his heart. While this would be counterintuitive for him to stop his Plavix, I would rather him stop his Plavix now to have a back surgery and do with the ramifications later than taken to the Cath Lab and find a significant lesion that would require maintenance on Plavix.  For now would continue aspirin and Plavix as well as beta blocker and statin.  Risks, benefits, alternatives and indications the procedure were explained in detail. He is familiar with a heart catheterization procedure. He agrees to proceed.

## 2017-01-14 NOTE — Assessment & Plan Note (Signed)
Seen by Dr. Curt Bears. I think the plan for now is to continue beta blocker, and discussed potentially switching from flecainide to something else. I don't think the plan is to go forward with a redo loop recorder.  - Much of these concerns will be affected by his cardiac catheterization results.

## 2017-01-14 NOTE — Assessment & Plan Note (Signed)
Interesting that he only had one episode in February that was concerning and has been relatively asymptomatic since then. Unfortunately his stress test is quite abnormal.  Plan: Schedule for cardiac catheterization with possible PCI. Continue aspirin and Plavix. Continue blocker and statin. The presence of potential active coronary disease would make it a good option for [place] to consider something besides flecainide for VT control.

## 2017-01-18 ENCOUNTER — Other Ambulatory Visit: Payer: Self-pay | Admitting: *Deleted

## 2017-01-18 DIAGNOSIS — Z01818 Encounter for other preprocedural examination: Secondary | ICD-10-CM

## 2017-01-21 LAB — CBC
HCT: 38.7 % (ref 38.5–50.0)
Hemoglobin: 13.1 g/dL — ABNORMAL LOW (ref 13.2–17.1)
MCH: 31.4 pg (ref 27.0–33.0)
MCHC: 33.9 g/dL (ref 32.0–36.0)
MCV: 92.8 fL (ref 80.0–100.0)
MPV: 10.3 fL (ref 7.5–12.5)
Platelets: 205 10*3/uL (ref 140–400)
RBC: 4.17 MIL/uL — ABNORMAL LOW (ref 4.20–5.80)
RDW: 13.7 % (ref 11.0–15.0)
WBC: 8.6 10*3/uL (ref 3.8–10.8)

## 2017-01-21 LAB — BASIC METABOLIC PANEL
BUN: 21 mg/dL (ref 7–25)
CO2: 25 mmol/L (ref 20–31)
Calcium: 9.3 mg/dL (ref 8.6–10.3)
Chloride: 104 mmol/L (ref 98–110)
Creat: 1.33 mg/dL — ABNORMAL HIGH (ref 0.70–1.18)
Glucose, Bld: 103 mg/dL — ABNORMAL HIGH (ref 65–99)
Potassium: 4.7 mmol/L (ref 3.5–5.3)
Sodium: 137 mmol/L (ref 135–146)

## 2017-01-22 LAB — PROTIME-INR
INR: 1
Prothrombin Time: 10.8 s (ref 9.0–11.5)

## 2017-01-26 ENCOUNTER — Other Ambulatory Visit: Payer: Self-pay | Admitting: *Deleted

## 2017-01-26 ENCOUNTER — Telehealth: Payer: Self-pay | Admitting: *Deleted

## 2017-01-26 DIAGNOSIS — Z01818 Encounter for other preprocedural examination: Secondary | ICD-10-CM

## 2017-01-26 DIAGNOSIS — R9439 Abnormal result of other cardiovascular function study: Secondary | ICD-10-CM

## 2017-01-26 NOTE — Telephone Encounter (Signed)
-----   Message from [name], MD sent at 01/24/2017  6:28 PM EDT ----- Labs look ok for cath - Cr is up a bit -- we should check BMP in AM pre-cath & increase Pre-cath IV Hydration rate - 250 mL bolus followed by 150 mL /hr.  [facility]

## 2017-01-26 NOTE — Telephone Encounter (Signed)
SPOKE TO WIFE, PATIENT NOT AT HOME.  INFORMATION GIVEN IN REGARDS  LAB RESULT \ CONFIRMED -TO BE AT HOSPITAL AT 6 AM

## 2017-01-27 ENCOUNTER — Ambulatory Visit (HOSPITAL_COMMUNITY)
Admission: RE | Disposition: A | Discharge status: Home / Self Care | Payer: Self-pay | Source: Ambulatory Visit | Attending: Cardiology

## 2017-01-27 ENCOUNTER — Ambulatory Visit (HOSPITAL_COMMUNITY)
Admission: RE | Admit: 2017-01-27 | Discharge: 2017-01-27 | Disposition: A | Discharge status: Home / Self Care | Payer: Medicare Other | Source: Ambulatory Visit | Attending: Cardiology | Admitting: Cardiology

## 2017-01-27 ENCOUNTER — Encounter (HOSPITAL_COMMUNITY): Payer: Self-pay | Admitting: Cardiology

## 2017-01-27 DIAGNOSIS — Z86718 Personal history of other venous thrombosis and embolism: Secondary | ICD-10-CM | POA: Insufficient documentation

## 2017-01-27 DIAGNOSIS — Y840 Cardiac catheterization as the cause of abnormal reaction of the patient, or of later complication, without mention of misadventure at the time of the procedure: Secondary | ICD-10-CM | POA: Insufficient documentation

## 2017-01-27 DIAGNOSIS — Z7902 Long term (current) use of antithrombotics/antiplatelets: Secondary | ICD-10-CM | POA: Diagnosis not present

## 2017-01-27 DIAGNOSIS — Z8249 Family history of ischemic heart disease and other diseases of the circulatory system: Secondary | ICD-10-CM | POA: Diagnosis not present

## 2017-01-27 DIAGNOSIS — I252 Old myocardial infarction: Secondary | ICD-10-CM | POA: Diagnosis not present

## 2017-01-27 DIAGNOSIS — Y831 Surgical operation with implant of artificial internal device as the cause of abnormal reaction of the patient, or of later complication, without mention of misadventure at the time of the procedure: Secondary | ICD-10-CM | POA: Diagnosis not present

## 2017-01-27 DIAGNOSIS — E785 Hyperlipidemia, unspecified: Secondary | ICD-10-CM | POA: Insufficient documentation

## 2017-01-27 DIAGNOSIS — Z882 Allergy status to sulfonamides status: Secondary | ICD-10-CM | POA: Insufficient documentation

## 2017-01-27 DIAGNOSIS — R079 Chest pain, unspecified: Secondary | ICD-10-CM | POA: Diagnosis present

## 2017-01-27 DIAGNOSIS — I472 Ventricular tachycardia: Secondary | ICD-10-CM | POA: Diagnosis not present

## 2017-01-27 DIAGNOSIS — R000 Tachycardia, unspecified: Secondary | ICD-10-CM

## 2017-01-27 DIAGNOSIS — I2510 Atherosclerotic heart disease of native coronary artery without angina pectoris: Secondary | ICD-10-CM | POA: Diagnosis present

## 2017-01-27 DIAGNOSIS — T82855A Stenosis of coronary artery stent, initial encounter: Secondary | ICD-10-CM | POA: Insufficient documentation

## 2017-01-27 DIAGNOSIS — K219 Gastro-esophageal reflux disease without esophagitis: Secondary | ICD-10-CM | POA: Insufficient documentation

## 2017-01-27 DIAGNOSIS — E1169 Type 2 diabetes mellitus with other specified complication: Secondary | ICD-10-CM | POA: Diagnosis present

## 2017-01-27 DIAGNOSIS — M1990 Unspecified osteoarthritis, unspecified site: Secondary | ICD-10-CM | POA: Insufficient documentation

## 2017-01-27 DIAGNOSIS — R9439 Abnormal result of other cardiovascular function study: Secondary | ICD-10-CM

## 2017-01-27 DIAGNOSIS — Z7982 Long term (current) use of aspirin: Secondary | ICD-10-CM | POA: Insufficient documentation

## 2017-01-27 DIAGNOSIS — I97630 Postprocedural hematoma of a circulatory system organ or structure following a cardiac catheterization: Secondary | ICD-10-CM | POA: Insufficient documentation

## 2017-01-27 DIAGNOSIS — Z955 Presence of coronary angioplasty implant and graft: Secondary | ICD-10-CM

## 2017-01-27 DIAGNOSIS — I25119 Atherosclerotic heart disease of native coronary artery with unspecified angina pectoris: Secondary | ICD-10-CM | POA: Diagnosis not present

## 2017-01-27 DIAGNOSIS — Z01818 Encounter for other preprocedural examination: Secondary | ICD-10-CM

## 2017-01-27 HISTORY — PX: CORONARY PRESSURE/FFR STUDY: CATH118243

## 2017-01-27 HISTORY — PX: CORONARY STENT INTERVENTION: CATH118234

## 2017-01-27 HISTORY — PX: LEFT HEART CATH AND CORONARY ANGIOGRAPHY: CATH118249

## 2017-01-27 LAB — POCT ACTIVATED CLOTTING TIME
Activated Clotting Time: 246 seconds
Activated Clotting Time: 290 seconds
Activated Clotting Time: 345 seconds
Activated Clotting Time: >1000 seconds

## 2017-01-27 LAB — BASIC METABOLIC PANEL
Anion gap: 8 (ref 5–15)
BUN: 18 mg/dL (ref 6–20)
CO2: 23 mmol/L (ref 22–32)
Calcium: 9 mg/dL (ref 8.9–10.3)
Chloride: 106 mmol/L (ref 101–111)
Creatinine, Ser: 1.06 mg/dL (ref 0.61–1.24)
GFR calc Af Amer: >60 mL/min (ref >60)
GFR calc non Af Amer: >60 mL/min (ref >60)
Glucose, Bld: 130 mg/dL — ABNORMAL HIGH (ref 65–99)
Potassium: 4 mmol/L (ref 3.5–5.1)
Sodium: 137 mmol/L (ref 135–145)

## 2017-01-27 SURGERY — LEFT HEART CATH AND CORONARY ANGIOGRAPHY
Anesthesia: LOCAL

## 2017-01-27 MED ORDER — SODIUM CHLORIDE 0.9% FLUSH: 3.0000 mL | INTRAVENOUS | Status: DC | PRN

## 2017-01-27 MED ORDER — ACETAMINOPHEN 325 MG PO TABS: 650.0000 mg | ORAL_TABLET | ORAL | Status: DC | PRN

## 2017-01-27 MED ORDER — VERAPAMIL HCL 2.5 MG/ML IV SOLN
INTRAVENOUS | Status: AC
  Filled 2017-01-27: qty 2

## 2017-01-27 MED ORDER — SODIUM CHLORIDE 0.9% FLUSH
3.0000 mL | Freq: Two times a day (BID) | INTRAVENOUS | Status: DC
Start: 2017-01-27 — End: 2017-01-27

## 2017-01-27 MED ORDER — IOPAMIDOL (ISOVUE-370) INJECTION 76%
INTRAVENOUS | Status: AC
  Filled 2017-01-27: qty 50

## 2017-01-27 MED ORDER — MIDAZOLAM HCL 2 MG/2ML IJ SOLN
INTRAMUSCULAR | Status: DC | PRN
  Administered 2017-01-27: 1 mg via INTRAVENOUS

## 2017-01-27 MED ORDER — IOPAMIDOL (ISOVUE-370) INJECTION 76%
INTRAVENOUS | Status: AC
  Filled 2017-01-27: qty 100

## 2017-01-27 MED ORDER — HEPARIN SODIUM (PORCINE) 1000 UNIT/ML IJ SOLN
INTRAMUSCULAR | Status: AC
  Filled 2017-01-27: qty 1

## 2017-01-27 MED ORDER — ONDANSETRON HCL 4 MG/2ML IJ SOLN: 4.0000 mg | Freq: Four times a day (QID) | INTRAMUSCULAR | Status: DC | PRN

## 2017-01-27 MED ORDER — NITROGLYCERIN 1 MG/10 ML FOR IR/CATH LAB
INTRA_ARTERIAL | Status: AC
  Filled 2017-01-27: qty 10

## 2017-01-27 MED ORDER — SODIUM CHLORIDE 0.9 % IV SOLN
INTRAVENOUS | Status: DC | PRN
  Administered 2017-01-27: 500 mL via INTRAVENOUS
  Administered 2017-01-27: 93 mL/h via INTRAVENOUS

## 2017-01-27 MED ORDER — ADENOSINE (DIAGNOSTIC) 140MCG/KG/MIN
INTRAVENOUS | Status: DC | PRN
  Administered 2017-01-27: 140 ug/kg/min via INTRAVENOUS

## 2017-01-27 MED ORDER — HEPARIN SODIUM (PORCINE) 1000 UNIT/ML IJ SOLN
INTRAMUSCULAR | Status: DC | PRN
  Administered 2017-01-27 (×2): 5000 [IU] via INTRAVENOUS
  Administered 2017-01-27: 3000 [IU] via INTRAVENOUS
  Administered 2017-01-27: 2000 [IU] via INTRAVENOUS

## 2017-01-27 MED ORDER — HEPARIN (PORCINE) IN NACL 2-0.9 UNIT/ML-% IJ SOLN
INTRAMUSCULAR | Status: DC | PRN
  Administered 2017-01-27: 1000 mL

## 2017-01-27 MED ORDER — HEPARIN (PORCINE) IN NACL 2-0.9 UNIT/ML-% IJ SOLN
INTRAMUSCULAR | Status: AC
  Filled 2017-01-27: qty 1000

## 2017-01-27 MED ORDER — MIDAZOLAM HCL 2 MG/2ML IJ SOLN
INTRAMUSCULAR | Status: AC
  Filled 2017-01-27: qty 2

## 2017-01-27 MED ORDER — SODIUM CHLORIDE 0.9 % IV SOLN: 250.0000 mL | INTRAVENOUS | Status: DC | PRN

## 2017-01-27 MED ORDER — VERAPAMIL HCL 2.5 MG/ML IV SOLN
INTRAVENOUS | Status: DC | PRN
  Administered 2017-01-27: 10 mL via INTRA_ARTERIAL

## 2017-01-27 MED ORDER — ASPIRIN 81 MG PO CHEW
CHEWABLE_TABLET | ORAL | Status: AC
  Administered 2017-01-27: 81 mg
  Filled 2017-01-27: qty 1

## 2017-01-27 MED ORDER — ADENOSINE 12 MG/4ML IV SOLN
INTRAVENOUS | Status: AC
  Filled 2017-01-27: qty 16

## 2017-01-27 MED ORDER — CLOPIDOGREL BISULFATE 75 MG PO TABS
ORAL_TABLET | ORAL | Status: AC
  Filled 2017-01-27: qty 2

## 2017-01-27 MED ORDER — SODIUM CHLORIDE 0.9 % IV SOLN: INTRAVENOUS | Status: AC

## 2017-01-27 MED ORDER — LIDOCAINE HCL 1 % IJ SOLN
INTRAMUSCULAR | Status: AC
  Filled 2017-01-27: qty 20

## 2017-01-27 MED ORDER — SODIUM CHLORIDE 0.9 % IV SOLN: INTRAVENOUS | Status: DC

## 2017-01-27 MED ORDER — LIDOCAINE HCL (PF) 1 % IJ SOLN
INTRAMUSCULAR | Status: DC | PRN
  Administered 2017-01-27: 2 mL

## 2017-01-27 MED ORDER — IOPAMIDOL (ISOVUE-370) INJECTION 76%
INTRAVENOUS | Status: DC | PRN
  Administered 2017-01-27: 180 mL via INTRA_ARTERIAL

## 2017-01-27 MED ORDER — SODIUM CHLORIDE 0.9 % IV BOLUS (SEPSIS)
250.0000 mL | Freq: Once | INTRAVENOUS | Status: AC
Start: 2017-01-27 — End: 2017-01-27
  Administered 2017-01-27: 250 mL via INTRAVENOUS

## 2017-01-27 MED ORDER — CLOPIDOGREL BISULFATE 75 MG PO TABS
150.0000 mg | ORAL_TABLET | Freq: Once | ORAL | Status: AC
  Administered 2017-01-27: 150 mg via ORAL

## 2017-01-27 SURGICAL SUPPLY — 25 items
BALLN ANGIOSCULPT RX 2.5X6 (BALLOONS) ×2
BALLN EMERGE MR 2.5X8 (BALLOONS) ×2
BALLN ~~LOC~~ EMERGE MR 3.25X8 (BALLOONS) ×2
BALLOON ANGIOSCULPT RX 2.5X6 (BALLOONS) IMPLANT
BALLOON EMERGE MR 2.5X8 (BALLOONS) IMPLANT
BALLOON ~~LOC~~ EMERGE MR 3.25X8 (BALLOONS) IMPLANT
CATH INFINITI 5FR ANG PIGTAIL (CATHETERS) ×1 IMPLANT
CATH LAUNCHER 5F EBU3.5 (CATHETERS) ×1 IMPLANT
CATH LAUNCHER 5F JR4 (CATHETERS) ×1 IMPLANT
CATH LAUNCHER 6FR EBU3.5 (CATHETERS) ×1 IMPLANT
CATH MICROCATH NAVVUS (MICROCATHETER) IMPLANT
CATH OPTITORQUE TIG 4.0 5F (CATHETERS) ×1 IMPLANT
DEVICE RAD COMP TR BAND LRG (VASCULAR PRODUCTS) ×1 IMPLANT
GLIDESHEATH SLEND A-KIT 6F 22G (SHEATH) ×1 IMPLANT
GUIDEWIRE INQWIRE 1.5J.035X260 (WIRE) IMPLANT
INQWIRE 1.5J .035X260CM (WIRE) ×2
KIT ENCORE 26 ADVANTAGE (KITS) ×1 IMPLANT
KIT HEART LEFT (KITS) ×2 IMPLANT
MICROCATHETER NAVVUS (MICROCATHETER) ×2
PACK CARDIAC CATHETERIZATION (CUSTOM PROCEDURE TRAY) ×2 IMPLANT
STENT SYNERGY DES 3X12 (Permanent Stent) ×1 IMPLANT
TRANSDUCER W/STOPCOCK (MISCELLANEOUS) ×2 IMPLANT
TUBING CIL FLEX 10 FLL-RA (TUBING) ×2 IMPLANT
WIRE HI TORQ VERSACORE-J 145CM (WIRE) ×1 IMPLANT
WIRE MARVEL STR TIP 190CM (WIRE) ×1 IMPLANT

## 2017-01-27 NOTE — H&P (View-Only) (Signed)
PCP: Yong Channel, MD  Clinic Note: Chief Complaint  Patient presents with  . Follow-up    Abnormal Myoview  . Coronary Artery Disease    HPI: Alexander Armstrong is a 76 y.o. male with a PMH below who presents today for 1 month follow-up to discuss Myoview results. I last saw him in March this year to establish cardiology care. 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 He was previously followed by Dr. Elonda Husky at [facility]. - He had had a prolonged episode of chest pain prior to his initial visit with me and I therefore ordered a Myoview stress test. I saw him, I referred him to Dr. Curt Bears from EP to discuss loop recorder and history of DVT.  Alexander Armstrong was last seen on April 2 --> she discuss potential stopping flecainide in starting something else. He also talked about pros and cons of reimplantation of a LINQ loop recorder -- he felt that it would unlikely change management and therefore would probably not benefit from before. He has been discussed with the family.  Recent Hospitalizations: None  Studies Reviewed:   Myoview from previous cardiologist showed no evidence of ischemia or infarction. EF 66% (October 2015) - report reviewed and scanned in chart  Myoview ([facility]) 12/22/2016 - images independently reviewed: EF 58%. INTERMEDIATE RISK study. Medium size severe intensity defect in the mid anterior-apical anterior and apical location consistent with prior infarct (nonreversible).  Also noted large sized moderate severity partially reversible defect in the basal inferoseptal, basal inferior and inferoseptal as well as mid inferior apical inferior wall consistent with ischemia.  Interval History: Alexander Armstrong returns today to discuss results of his stress test. He actually has been doing pretty well since I last saw him,  but his daughter indicates that these may be been slowing up a little bit over last few weeks. He will admit that maybe he is been a little more short of breath with exertion than usual over his baseline over the last few weeks but has not had any chest pain or pressure. Nothing that is been alarming to him. He is not had any prolonged dizzy spells or weakness spells but is having couple short bursts - these episodes usually happen when he is at rest, not walking He tends to concur with the concept of not replacing the loop recorder, but his wife is not quite sold on this.  He has not had any PND, orthopnea or edema. No sig. Near-syncope, TIA or amaurosis fugax. No melena, hematochezia or hematuria. No claudication.   He is not had an episode of discomfort in his chest like he had a few weeks before I last saw him. However since that he thinks he may be just noticing a little bit more shortness of breath with activity and has been more easily fatigued  ROS: A comprehensive was performed. Review of Systems  HENT: Negative for nosebleeds.   Respiratory: Negative for cough, sputum production and wheezing.   Cardiovascular:       Per history of present illness  Gastrointestinal: Negative for blood in stool.  Genitourinary: Negative for hematuria.  Musculoskeletal: Negative for joint pain.  Neurological: Positive for dizziness.       Still has brief summary weakness spells, short lived  Endo/Heme/Allergies: Does not bruise/bleed easily.  Psychiatric/Behavioral: Negative for  memory loss. The patient is not nervous/anxious and does not have insomnia.   All other systems reviewed and are negative.   Past Medical History:  Diagnosis Date  . Allergy   . Arthritis   . 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 basal cell skin cancer   . CAD S/P percutaneous coronary angioplasty 11/03/2004   a. mLAD - Taxus DES 3.0 x 20; b. (11/06/04) staged PCI to RCA Taxus DES 3.5 x 16  . Cardiac syncope    No  episode since starting flecainide. Had documented WIDE COMPLEX Tachycardia on loop recorder.  . Cataract bilateral cataracts  . Diverticulosis   . Esophageal stricture   . GERD (gastroesophageal reflux disease)   . Hiatal hernia 11/05/2010  . HLD (hyperlipidemia)   . Nonsustained paroxysmal ventricular tachycardia (HCC) 2014   Presumably controlled with flecainide. Unable to induce during EP study.  . Reflux gastritis   . 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 Staged PCI to the RCA.    Past Surgical History:  Procedure Laterality Date  . CARDIAC CATHETERIZATION  ~2011   [facility]: re-look Cath - patent stents  . ELECTROPHYSIOLOGIC STUDY  2014   Unable to stimulate VT seen on loop recorder  . implanted loop heart monitor x2  ~2011; 2014   Per report- batter out of date; apparently captured a prolonged episode of wide complex tachycardia associated with an episode of weakness/near syncope  . NM MYOVIEW LTD  07/2014   Unremarkable EKG. No evidence of ischemia or infarct. Mild anterior attenuation, cannot exclude prior infarct, but nonreversible. EF 66%.  . NM MYOVIEW LTD  12/22/2016   INTERMEDIATE RISK. EF 58%. Defect 1: Medium size severe defect in the mid anterior, apical anterior and apical wall consistent with prior anterior MI. Defect to: Large defect and moderate severity in the basal inferoseptal basal inferior and inferoseptal mid inferior wall consistent with ischemia.  . PERCUTANEOUS CORONARY STENT INTERVENTION (PCI-S)  January-February 2006   a. mLAD - Taxus DES 3.0 x 20; b. (11/06/04) staged PCI to RCA Taxus DES 3.5 x 16  . UMBILICAL HERNIA REPAIR      Current Meds  Medication Sig  . aspirin 81 MG tablet Take 81 mg by mouth daily.  . cetirizine (ZYRTEC) 10 MG tablet Take 10 mg by mouth as needed.  . Cholecalciferol (VITAMIN D) 2000 UNITS CAPS Take 1 capsule by mouth daily.  .  clopidogrel (PLAVIX) 75 MG tablet Take 75 mg by mouth daily.  . fexofenadine (ALLEGRA) 180 MG tablet Take 180 mg by mouth daily.  . flecainide (TAMBOCOR) 50 MG tablet Take 50 mg by mouth 2 (two) times daily.   . ipratropium (ATROVENT) 0.03 % nasal spray Place 2 sprays into the nose as needed for rhinitis.   . oxybutynin (DITROPAN) 5 MG tablet Take 5 mg by mouth 2 (two) times daily.  . pantoprazole (PROTONIX) 40 MG tablet Take 40 mg by mouth daily.  . propranolol (INDERAL) 20 MG tablet Take 20 mg by mouth 2 (two) times daily.  . rosuvastatin (CRESTOR) 20 MG tablet Take 20 mg by mouth daily.  . tamsulosin (FLOMAX) 0.4 MG CAPS capsule Take 0.4 mg by mouth daily after supper.     Allergies  Allergen Reactions  . Silver Sulfadiazine Rash and Hives  . Hydrocodone     rash rash    Social History   Social History  . Marital status:   Married    Spouse name: N/A  . Number of children: 1  . Years of education: N/A   Occupational History  . furniture Retired   Social History Main Topics  . Smoking status: Never Smoker  . Smokeless tobacco: Never Used  . Alcohol use No  . Drug use: No  . Sexual activity: Not Asked   Other Topics Concern  . None   Social History Narrative  . None    family history includes Cancer in his father; Glaucoma in his paternal aunt; Heart attack in his brother; Heart disease in his mother; Macular degeneration in his father; Multiple myeloma in his mother; Prostate cancer in his father; Uterine cancer in his mother.  Wt Readings from Last 3 Encounters:  01/12/17 207 lb 9.6 oz (94.2 kg)  01/04/17 207 lb 12.8 oz (94.3 kg)  12/22/16 207 lb (93.9 kg)    PHYSICAL EXAM BP 106/63   Pulse (!) 53   Ht 5' 11" (1.803 m)   Wt 207 lb 9.6 oz (94.2 kg)   SpO2 96%   BMI 28.95 kg/m  General appearance: alert, cooperative, appears stated age, no distress.  Well-nourished, well-groomed. Healthy-appearing Neck: no adenopathy, no carotid bruit and no JVD HEENT:  [place]/AT, EOMI, MMM, anicteric sclera Lungs: CTAB, normal percussion bilaterally and non-labored Heart: RRR, normal S1& S2.  No M/R/G.  Non-displaced PMI.  Occasional ectopy.  Abdomen: soft, NT/ND/NABS.  No HSM no HJR Extremities: extremities normal, atraumatic, no cyanosis, or edema  Pulses: 2+ and symmetric;  Skin: mobility and turgor normal, no edema and no evidence of bleeding or bruising Neurologic: Mental status: Alert, oriented, thought content appropriate    Adult ECG Report n/a  Other studies Reviewed: Additional studies/ records that were reviewed today include:  Recent Labs:  n/a - followed by PCP  ASSESSMENT / PLAN: Problem List Items Addressed This Visit    Abnormal nuclear stress test    Somewhat concerning Myoview result with what appears to be 2 separate perfusion defects. I actually see the inferior defect is being more fixed than the anterior. He has had stents in both the LAD and the RCA which is concerning. The enhancing part is that he doesn't seem to be overly symptomatic. The main symptom is that he just seems to be more fatigued over last few weeks. Despite his relative lack of symptoms, the stress test is concerning enough that I think it warrants proceeding with cardiac catheterization. I reviewed the images with the patient and his daughter in and they agreed that it is definitely an abnormal appearing study and that invasive evaluation would be warranted.  Plan: Schedule for Left Heart Catheterization with Coronary Angiography and Possible PCI CC is not having symptoms, I think is probably best friend take go ahead and have his back injection done before we do his heart. While this would be counterintuitive for him to stop his Plavix, I would rather him stop his Plavix now to have a back surgery and do with the ramifications later than taken to the Cath Lab and find a significant lesion that would require maintenance on Plavix.  For now would continue aspirin and  Plavix as well as beta blocker and statin.  Risks, benefits, alternatives and indications the procedure were explained in detail. He is familiar with a heart catheterization procedure. He agrees to proceed.      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 Unfortunately his stress test is quite abnormal.  Plan: Schedule for cardiac catheterization with possible PCI. Continue aspirin and Plavix. Continue blocker and statin. The presence of potential active coronary disease would make it a good option for us to consider something besides flecainide for VT control.      Relevant Orders   CBC   Basic metabolic panel   Protime-INR   LEFT HEART CATHETERIZATION WITH CORONARY ANGIOGRAM   Cardiac syncope (Chronic)   Relevant Orders   CBC   LEFT HEART CATHETERIZATION WITH CORONARY ANGIOGRAM   Hyperlipidemia LDL goal <70 (Chronic)   Relevant Orders   CBC   Wide-complex tachycardia (HCC) (Chronic)    Seen by Dr. Camnitz. I think the plan for now is to continue beta blocker, and discussed potentially switching from flecainide to something else. I don't think the plan is to go forward with a redo loop recorder.  - Much of these concerns will be affected by his cardiac catheterization results.      Relevant Orders   CBC    Other Visit Diagnoses    Pre-op testing       Relevant Orders   CBC   LEFT HEART CATHETERIZATION WITH CORONARY ANGIOGRAM   Clotting disorder (HCC)       Relevant Orders   Protime-INR      Current medicines are reviewed at length with the patient today. (+/- concerns) n/a The following changes have been made: n/a  Patient Instructions  SCHEDULE  NEXT WEEK WITH DR Tyona Nilsen AFTER BACK INJECTION Your  physician has requested that you have a LEFT HEART cardiac catheterization ( RIGHT RADIAL). Cardiac catheterization is used to diagnose and/or treat various heart conditions. Doctors may recommend this procedure for a number of different reasons. The most common reason is to evaluate chest pain. Chest pain can be a symptom of coronary artery disease (CAD), and cardiac catheterization can show whether plaque is narrowing or blocking your heart's arteries. This procedure is also used to evaluate the valves, as well as measure the blood flow and oxygen levels in different parts of your heart. For further information please visit www.cardiosmart.org. Please follow instruction sheet, as given.    NEED LABS FOR CATH = CBC, PT, BMP NO CHEST XRAY NEEDED    BACK INJECTIONS  OKAY TO PLAVIX  5 TO 7 DAYS PRIOR TO INJECTIONS.  Your physician recommends that you schedule a follow-up appointment in 2 WEEKS AFTER CATH.     [facility] [address] [place] [address] Dept: [phone] Loc: [phone]  Fount Poppe  01/12/2017  You are scheduled for a Cardiac Catheterization on ,          with Dr. Kahle Mcqueen.  1. Please arrive at the [facility] (Main Entrance A) at [facility]: [address] [place], [place] [address] at  (two hours before your procedure to ensure your preparation). Free valet parking service is available.   Special note: Every effort is made to have your procedure done on time. Please understand that emergencies sometimes delay scheduled procedures.  2. Diet: Do not eat or drink anything after midnight prior to your procedure except sips of water to take medications.  3. Labs: cbc, BMP ,PT  4. Medication instructions in   preparation for your procedure:     On the morning of your procedure, take your Plavix/Clopidogrel and aspirin and any morning medicines NOT listed above.   You may use sips of water.  5. Plan for one night stay--bring personal belongings. 6. Bring a current list of your medications and current insurance cards. 7. You MUST have a responsible person to drive you home. 8. Someone MUST be with you the first 24 hours after you arrive home or your discharge will be delayed. 9. Please wear clothes that are easy to get on and off and wear slip-on shoes.  Thank you for allowing us to care for you!   -- [facility]     Studies Ordered:   Orders Placed This Encounter  Procedures  . CBC  . Basic metabolic panel  . Protime-INR  . LEFT HEART CATHETERIZATION WITH CORONARY ANGIOGRAM      Milton Sagona, M.D., M.S. Interventional Cardiologist   Pager # [phone] Phone # [phone] [address] [place], [place] [address] 

## 2017-01-27 NOTE — Progress Notes (Signed)
Slight blood oozing in the tr band. Pressure held 10 minutes, continued to let air out of band will continue to monitor.

## 2017-01-27 NOTE — Research (Signed)
Optimize Informed Consent   Subject Name: Alexander Armstrong  Subject met inclusion and exclusion criteria.  The informed consent form, study requirements and expectations were reviewed with the subject and questions and concerns were addressed prior to the signing of the consent form.  The subject verbalized understanding of the trail requirements.  The subject agreed to participate in the Optimize trial and signed the informed consent.  The informed consent was obtained prior to performance of any protocol-specific procedures for the subject.  A copy of the signed informed consent was given to the subject and a copy was placed in the subject's medical record.  [name]  01/27/17   08:15am

## 2017-01-27 NOTE — Discharge Summary (Signed)
Discharge Summary    Patient ID: Alexander Armstrong,  MRN: [id number], DOB/AGE: [birth year] 76 y.o.  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 Silver Sulfadiazine Rash and Hives  . Hydrocodone Rash    Diagnostic Studies/Procedures    LHC: 01/27/17   Conclusion     Mid LAD-1 lesion, 99 %stenosed - proximal to & involving the proximal portion of the previous stent.  A STENT SYNERGY DES 3X12 drug eluting stent was successfully placed, and overlaps previously placed stent.  Post intervention, there is a 0% residual stenosis.  _____________  Prox RCA-1 lesion, 55 %stenosed - In-stent Re-stenosis: FFR 0.86  The left ventricular ejection fraction is 50-55% by visual estimate.  Post intervention, there is a 0% residual stenosis.   Severe single-vessel disease severe stenosis in the LAD involving the proximal portion of the petrous the placed stent. This was treated with a single DES stent after angioplasty.  FFR negative lesion in the RCA.  I suspect that in the setting of the severe LAD lesion, this lesion was physiologically significant on the Myoview stress test. No longer significant following PCI of LAD.  Plan:   He has been on Plavix. We will give an additional 150 mg today.  Anticipate same-day discharge following bedrest  Continue home medications for now with the exception of flecainide.  He should see me in follow-up in 2-3 weeks.  _____________   History of Present Illness     Alexander Armstrong is a 76 y.o. male with a PMH noted above. Dr. [name] saw him in  March this year to establish cardiology care. 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 He was previously followed by Dr. Elonda Husky at [facility]. He had had a prolonged episode of chest pain prior to his initial visit and therefore a Myoview stress test was ordered. He was referred to Dr. Curt Bears by Dr. [name] for EP to discuss loop recorder and history of DVT.  He was seen on April 2 and discussed potential stopping flecainide in starting something else. He also talked about pros and cons of reimplantation of a LINQ loop recorder -- he felt that it would unlikely change management and therefore would probably not benefit from before.   Myoview was read as abnormal with perfusion defects in the inferior and anterior wall, with known stents in the LAD and RCA. He was referred for cardiac cath.   Hospital Course   Underwent LHC with Dr. [name] on 01/27/17 noted above with DES x1 placed to pLAD after angioplasty. FFR negative lesion in the RCA. He was given an additional dose of 150mg  plavix post cath. Seen by cardiac rehab in short stay with educations given. Did have some bleeding post cath from radial site. Manual pressure was held and bleeding stopped, mild hematoma improved. Continued TR removal per protocol. Dr. [name] was made aware of re-bleed while at short stay. TR band was removed, and followed up assessment  done by Delos Haring. Pt was cleared for discharge home.   Follow up was arranged in the office. Medications are listed below. His flecainide was held at discharge per Dr. Allison Quarry order.  _____________  Discharge Vitals Blood pressure (!) 105/56, pulse (!) 51, temperature 97.6 F (36.4 C), temperature source Oral, resp. rate 17, height 5\' 11"  (1.803 m), weight 205 lb (93 kg), SpO2 94 %.  Filed Weights   01/27/17 0703    Weight: 205 lb (93 kg)    Labs & Radiologic Studies    CBC No results for input(s): WBC, NEUTROABS, HGB, HCT, MCV, PLT in the last 72 hours. Basic Metabolic Panel  Recent Labs  01/27/17 0717  NA 137  K 4.0  CL 106  CO2 23  GLUCOSE 130*  BUN 18  CREATININE 1.06  CALCIUM 9.0   Liver Function Tests No results for input(s): AST, ALT, ALKPHOS, BILITOT, PROT, ALBUMIN in the last 72 hours. No results for input(s): LIPASE, AMYLASE in the last 72 hours. Cardiac Enzymes No results for input(s): CKTOTAL, CKMB, CKMBINDEX, TROPONINI in the last 72 hours. BNP Invalid input(s): POCBNP D-Dimer No results for input(s): DDIMER in the last 72 hours. Hemoglobin A1C No results for input(s): HGBA1C in the last 72 hours. Fasting Lipid Panel No results for input(s): CHOL, HDL, LDLCALC, TRIG, CHOLHDL, LDLDIRECT in the last 72 hours. Thyroid Function Tests No results for input(s): TSH, T4TOTAL, T3FREE, THYROIDAB in the last 72 hours.  Invalid input(s): FREET3 _____________  No results found. Disposition   Pt is being discharged home today in good condition.  Follow-up Plans & Appointments    Follow-up Information    [name], MD Follow up on 02/09/2017.   Specialty:  Cardiology Why:  at 9:20am for your follow up appt Contact information: [address] [place] [address] [phone]          Discharge Instructions    Amb Referral to Cardiac Rehabilitation    Complete by:  As directed    Referring to [facility] program   Diagnosis:  Coronary Stents   Call MD for:  redness, tenderness, or signs of infection (pain, swelling, redness, odor or green/yellow discharge around incision site)    Complete by:  As directed    Diet - low sodium heart healthy    Complete by:  As directed    Discharge instructions    Complete by:  As directed    Radial Site Care Refer to this sheet in the next few weeks. These instructions provide you with information on caring  for yourself after your procedure. Your caregiver may also give you more specific instructions. Your treatment has been planned according to current medical practices, but problems sometimes occur. Call your caregiver if you have any problems or questions after your procedure. HOME CARE INSTRUCTIONS You may shower the day after the procedure.Remove the bandage (dressing) and gently wash the site with plain soap and water.Gently pat the site dry.  Do not apply powder or lotion to the site.  Do not submerge the affected site in water for 3 to 5 days.  Inspect the site at least twice daily.  Do not flex or bend the affected arm for 24 hours.  No lifting over 5 pounds (2.3 kg) for 5 days after your procedure.  Do not drive home if you are discharged the same day of the procedure. Have someone else drive you.  You may drive 24 hours after the procedure unless otherwise instructed  by your caregiver.  What to expect: Any bruising will usually fade within 1 to 2 weeks.  Blood that collects in the tissue (hematoma) may be painful to the touch. It should usually decrease in size and tenderness within 1 to 2 weeks.  SEEK IMMEDIATE MEDICAL CARE IF: You have unusual pain at the radial site.  You have redness, warmth, swelling, or pain at the radial site.  You have drainage (other than a small amount of blood on the dressing).  You have chills.  You have a fever or persistent symptoms for more than 72 hours.  You have a fever and your symptoms suddenly get worse.  Your arm becomes pale, cool, tingly, or numb.  You have heavy bleeding from the site. Hold pressure on the site.   Increase activity slowly    Complete by:  As directed       Discharge Medications   Current Discharge Medication List    CONTINUE these medications which have NOT CHANGED   Details  acetaminophen (TYLENOL) 500 MG tablet Take 1,000 mg by mouth 3 (three) times daily as needed for moderate pain or headache.    aspirin 81 MG  tablet Take 81 mg by mouth at bedtime.     Carboxymethylcellul-Glycerin (LUBRICATING EYE DROPS OP) Apply 1 drop to eye daily as needed (dry eyes).    Cholecalciferol (VITAMIN D3) 1000 units CAPS Take 1,000 Units by mouth at bedtime.    clopidogrel (PLAVIX) 75 MG tablet Take 75 mg by mouth daily.    fexofenadine (ALLEGRA) 180 MG tablet Take 180 mg by mouth 2 (two) times daily.     ipratropium (ATROVENT) 0.03 % nasal spray Place 2 sprays into the nose as needed for rhinitis.     oxybutynin (DITROPAN) 5 MG tablet Take 5 mg by mouth 2 (two) times daily. Refills: 3    pantoprazole (PROTONIX) 40 MG tablet Take 40 mg by mouth daily.    propranolol (INDERAL) 20 MG tablet Take 20 mg by mouth 2 (two) times daily.    psyllium (REGULOID) 0.52 g capsule Take 0.52 g by mouth 2 (two) times daily.    rosuvastatin (CRESTOR) 20 MG tablet Take 20 mg by mouth daily.    tamsulosin (FLOMAX) 0.4 MG CAPS capsule Take 0.4 mg by mouth daily after supper.       STOP taking these medications     flecainide (TAMBOCOR) 50 MG tablet      ranitidine (ZANTAC) 150 MG tablet          Aspirin prescribed at discharge?  Yes High Intensity Statin Prescribed? (Lipitor 40-80mg  or Crestor 20-40mg ): Yes Beta Blocker Prescribed? Yes For EF <40%, was ACEI/ARB Prescribed? No, will defer to outpatient follow up ADP Receptor Inhibitor Prescribed? (i.e. Plavix etc.-Includes Medically Managed Patients): Yes For EF <40%, Aldosterone Inhibitor Prescribed? No: EF ok Was EF assessed during THIS hospitalization? Yes Was Cardiac Rehab II ordered? (Included Medically managed Patients): Yes   Outstanding Labs/Studies   None  Duration of Discharge Encounter   Greater than 30 minutes including physician time.  Signed, Reino Bellis NP-C 01/27/2017, 3:54 PM

## 2017-01-27 NOTE — Progress Notes (Signed)
[id number] Education completed with pt and wife who voiced understanding. Stressed importance of plavix with stent. Reviewed NTG use, ex ed and heart healthy food choices. Discussed CRP 2. Pt has done before. Will refer to [place] CRP 2. Graylon Good RN BSN 01/27/2017 1:40 PM

## 2017-01-27 NOTE — Progress Notes (Signed)
When I assessed radial site to remove air, I noticed large hematoma at edge of TRB.  Pt states his arm is sore and achy.  Pressure held for 20 min with resolution of hematoma.  VSS/ reported to Baird Kay pt's RN who notified PA for Dr. [name]

## 2017-01-27 NOTE — Progress Notes (Signed)
TR band removed, no bleeding with applied dressing; stable right radial catheter site. PT feeling well and ready for home. Will give the OK for nurse to discharge

## 2017-01-27 NOTE — Interval H&P Note (Signed)
History and Physical Interval Note:  01/27/2017 7:59 AM  Alexander Armstrong  has presented today for surgery, with the diagnosis of cad, syncope with Abnormal Myoview StT The various methods of treatment have been discussed with the patient and family. After consideration of risks, benefits and other options for treatment, the patient has consented to  Procedure(s): Left Heart Cath and Coronary Angiography (N/A) with Possible Percutaneous Intervention as a surgical intervention .  The patient's history has been reviewed, patient examined, no change in status, stable for surgery.  I have reviewed the patient's chart and labs.  Questions were answered to the patient's satisfaction.    Cath Lab Visit (complete for each Cath Lab visit)  Clinical Evaluation Leading to the Procedure:   ACS: No.  Non-ACS:    Anginal Classification: CCS II  Anti-ischemic medical therapy: Minimal Therapy (1 class of medications)  Non-Invasive Test Results: Intermediate-risk stress test findings: cardiac mortality 1-3%/year  Prior CABG: No previous CABG    Alexander Armstrong

## 2017-01-27 NOTE — Discharge Instructions (Signed)
Radial Site Care °Refer to this sheet in the next few weeks. These instructions provide you with information about caring for yourself after your procedure. Your health care provider may also give you more specific instructions. Your treatment has been planned according to current medical practices, but problems sometimes occur. Call your health care provider if you have any problems or questions after your procedure. °What can I expect after the procedure? °After your procedure, it is typical to have the following: °· Bruising at the radial site that usually fades within 1-2 weeks. °· Blood collecting in the tissue (hematoma) that may be painful to the touch. It should usually decrease in size and tenderness within 1-2 weeks. °Follow these instructions at home: °· Take medicines only as directed by your health care provider. °· You may shower 24-48 hours after the procedure or as directed by your health care provider. Remove the bandage (dressing) and gently wash the site with plain soap and water. Pat the area dry with a clean towel. Do not rub the site, because this may cause bleeding. °· Do not take baths, swim, or use a hot tub until your health care provider approves. °· Check your insertion site every day for redness, swelling, or drainage. °· Do not apply powder or lotion to the site. °· Do not flex or bend the affected arm for 24 hours or as directed by your health care provider. °· Do not push or pull heavy objects with the affected arm for 24 hours or as directed by your health care provider. °· Do not lift over 10 lb (4.5 kg) for 5 days after your procedure or as directed by your health care provider. °· Ask your health care provider when it is okay to: °¨ Return to work or school. °¨ Resume usual physical activities or sports. °¨ Resume sexual activity. °· Do not drive home if you are discharged the same day as the procedure. Have someone else drive you. °· You may drive 24 hours after the procedure  unless otherwise instructed by your health care provider. °· Do not operate machinery or power tools for 24 hours after the procedure. °· If your procedure was done as an outpatient procedure, which means that you went home the same day as your procedure, a responsible adult should be with you for the first 24 hours after you arrive home. °· Keep all follow-up visits as directed by your health care provider. This is important. °Contact a health care provider if: °· You have a fever. °· You have chills. °· You have increased bleeding from the radial site. Hold pressure on the site. CALL 911 °Get help right away if: °· You have unusual pain at the radial site. °· You have redness, warmth, or swelling at the radial site. °· You have drainage (other than a small amount of blood on the dressing) from the radial site. °· The radial site is bleeding, and the bleeding does not stop after 30 minutes of holding steady pressure on the site. °· Your arm or hand becomes pale, cool, tingly, or numb. °This information is not intended to replace advice given to you by your health care provider. Make sure you discuss any questions you have with your health care provider. °Document Released: 10/24/2010 Document Revised: 02/27/2016 Document Reviewed: 04/09/2014 °Elsevier Interactive Patient Education © 2017 [employer] ° ° °

## 2017-01-28 ENCOUNTER — Encounter (HOSPITAL_COMMUNITY): Payer: Self-pay | Admitting: Cardiology

## 2017-01-28 MED FILL — Nitroglycerin IV Soln 100 MCG/ML in D5W: INTRA_ARTERIAL | Qty: 10 | Status: AC

## 2017-02-09 ENCOUNTER — Ambulatory Visit (INDEPENDENT_AMBULATORY_CARE_PROVIDER_SITE_OTHER): Payer: Medicare Other | Admitting: Cardiology

## 2017-02-09 ENCOUNTER — Encounter: Payer: Self-pay | Admitting: Cardiology

## 2017-02-09 VITALS — BP 110/68 | HR 46 | Ht 71.0 in | Wt 204.0 lb

## 2017-02-09 DIAGNOSIS — R9439 Abnormal result of other cardiovascular function study: Secondary | ICD-10-CM

## 2017-02-09 DIAGNOSIS — Z955 Presence of coronary angioplasty implant and graft: Secondary | ICD-10-CM

## 2017-02-09 DIAGNOSIS — I25119 Atherosclerotic heart disease of native coronary artery with unspecified angina pectoris: Secondary | ICD-10-CM

## 2017-02-09 DIAGNOSIS — I472 Ventricular tachycardia: Secondary | ICD-10-CM

## 2017-02-09 DIAGNOSIS — I209 Angina pectoris, unspecified: Secondary | ICD-10-CM | POA: Diagnosis not present

## 2017-02-09 DIAGNOSIS — E785 Hyperlipidemia, unspecified: Secondary | ICD-10-CM

## 2017-02-09 DIAGNOSIS — R000 Tachycardia, unspecified: Secondary | ICD-10-CM

## 2017-02-09 NOTE — Patient Instructions (Signed)
NO CHANGE WITH CURRENT TREATMENT OR MEDICATIONS    Your physician wants you to follow-up in 12 MONTHS WITH DR HARDING. You will receive a reminder letter in the mail two months in advance. If you don't receive a letter, please call our office to schedule the follow-up appointment.      If you need a refill on your cardiac medications before your next appointment, please call your pharmacy.

## 2017-02-09 NOTE — Progress Notes (Signed)
PCP: [name], MD  Clinic Note: Chief Complaint  Patient presents with  . Follow-up    Post cath-PCI  . Coronary Artery Disease    HPI: Alexander Armstrong is a 76 y.o. male with a PMH below who presents today for Post-PCI follow-up.  He is a history of paroxysmal VT previously followed at [facility] - by Dr. Elonda Husky. 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  Alexander Armstrong was last seen on 01/12/2017 in response to an abnormal stress test ordered March to establish a baseline but also in response to one prolonged episode of chest discomfort that occurred leading up to his initial visit with me on 12/08/2016. At that time he really was not having any significant chest discomfort or dyspnea, was just simply noticing short discomfort episodes but also just a little bit more exertional dyspnea and fatigue over the last weeks leading up to the follow-up visit.  Recent Hospitalizations: For outpatient pre-catheterization-PCI on 01/27/2017  Studies Personally Reviewed - if available, images/films reviewed: From Epic Chart (I performed the procedure & reviewed films with the patient during the visit)  Cath/PCI 01/27/2017: Severe single-vessel disease severe stenosis in the LAD involving the proximal portion of the previously placed stent. This was treated with a single DES stent after angioplasty.  Mid LAD-1 lesion, 99 %stenosed - proximal to & involving the proximal portion of the previous stent.  A STENT SYNERGY DES 3X12 drug eluting stent was successfully placed, and  overlaps previously placed stent.  Post intervention, there is a 0% residual stenosis.  _____________  Prox RCA-1 lesion, 55 %stenosed - In-stent Re-stenosis: FFR 0.86  The left ventricular ejection fraction is 50-55% by visual estimate.  Post intervention, there is a 0% residual stenosis.   Diagnostic Diagram               Post-Intervention Diagram               Interval History: Alexander Armstrong presents today for follow-up. He does state that he feels more energy than he had before. Otherwise really doesn't note to me differences. He had 2 episodes of is weird week sensation episodes since his PCI but has not had any other untoward symptoms. He was able to go home as a same-day PCI and has had no problems. His radial site is been stable. He has been out of pickup his activity level with a little bit more than he had been. He is considering doing quite rehabilitation and in [place].  As far as his weird palpitation spells, he maybe had 2 is noted, but they were short-lived and he is not had anything sustained. He is due to follow back up with Dr. Curt Bears in June after having stopped his flecainide.  No chest pain or shortness of breath with rest or exertion.  No PND, orthopnea or edema.  No palpitations, syncope/near syncope. No TIA/amaurosis fugax symptoms. No melena, hematochezia, hematuria, or epstaxis. No claudication.  ROS: A comprehensive was performed. Pertinent positives and negatives as  above Review of Systems  Constitutional: Negative for malaise/fatigue and weight loss.  Respiratory: Negative for cough, shortness of breath and wheezing.   Musculoskeletal: Positive for joint pain (Mild arthritis pains).  Neurological: Negative for dizziness and loss of consciousness.  Psychiatric/Behavioral: Negative.   All other systems reviewed and are negative.   I have reviewed and (if needed) personally updated the patient's problem list, medications, allergies, past medical and  surgical history, social and family history.   Past Medical History:  Diagnosis Date  . Allergy   . Arthritis   . 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 basal cell skin cancer   . CAD S/P percutaneous coronary angioplasty 11/03/2004   a. mLAD - Taxus DES 3.0 x 20; b. (11/06/04) staged PCI to RCA Taxus DES 3.5 x 16; c. (01/2017).  @ [facility] (Dr. [name]) Synergy DES 3.0 x 12 (for 99% lesion @ prox edge of old stent  . Cardiac syncope    No episode since starting flecainide. Had documented WIDE COMPLEX Tachycardia on loop recorder.  . Cataract bilateral cataracts  . Diverticulosis   . Esophageal stricture   . GERD (gastroesophageal reflux disease)   . Hiatal hernia 11/05/2010  . HLD (hyperlipidemia)   . Nonsustained paroxysmal ventricular tachycardia ([place]) 2014   Presumably controlled with flecainide. Unable to induce during EP study.  . Reflux gastritis   . STEMI involving left anterior descending coronary artery ([place]) 11/03/2004   [facility]: Occluded LAD treated with DES. Staged PCI to the RCA.    Past Surgical History:  Procedure Laterality Date  . CARDIAC CATHETERIZATION  ~2011   [facility]: re-look Cath - patent stents  . CORONARY STENT INTERVENTION N/A 01/27/2017   Procedure: Coronary Stent Intervention;  Surgeon: [name], MD;  Location: [place] [facility]: mLAD (just after D1) overlapping prior DES -> SYNERGY DES 3X12 drug eluting stent  . ELECTROPHYSIOLOGIC STUDY  2014   Unable to stimulate VT seen on loop recorder  . implanted loop heart monitor x2  ~2011; 2014   Per report- batter out of date; apparently captured a prolonged episode of wide complex tachycardia associated with an episode of weakness/near syncope  . INTRAVASCULAR PRESSURE WIRE/FFR STUDY N/A 01/27/2017   Procedure: Intravascular Pressure Wire/FFR Study;  Surgeon: [name], MD;  Location: [place] [facility];  Service: Cardiovascular;  Laterality: N/A;  . LEFT HEART CATH AND CORONARY  ANGIOGRAPHY N/A 01/27/2017   Procedure: Left Heart Cath and Coronary Angiography;  Surgeon: [name], MD;  Location: [facility] IN[facility]: mLAD 99% stenosis pre-Stent & ISR --> PCI. Ost DI ~50%. pRCA DES ~50-60% FFR 0.86.  [name] NM MYOVIEW LTD  07/2014   Unremarkable EKG. No evidence of ischemia or infarct. Mild anterior attenuation, cannot exclude prior infarct, but nonreversible. EF 66%.  [name] NM MYOVIEW LTD  12/22/2016   INTERMEDIATE RISK. EF 58%. Defect 1: Medium size severe defect in the mid anterior, apical anterior and apical wall consistent with prior anterior MI. Defect to: Large defect and moderate severity in the basal inferoseptal basal inferior and inferoseptal mid inferior wall consistent with ischemia.  [name] PERCUTANEOUS CORONARY STENT INTERVENTION (PCI-S)  January-February 2006   a. mLAD - Taxus DES 3.0 x 20; b. (11/06/04) staged PCI to RCA Taxus DES 3.5 x 16; c. 01/27/2017: PCI to mLAD prox to old  stent - Synergy DES 3.0 x 12  . UMBILICAL HERNIA REPAIR      No outpatient prescriptions have been  marked as taking for the 02/09/17 encounter (Office Visit) with [name], MD.    Allergies  Allergen Reactions  . Silver Sulfadiazine Rash and Hives  . Hydrocodone Rash    Social History   Social History  . Marital status: Married    Spouse name: N/A  . Number of children: 1  . Years of education: N/A   Occupational History  . furniture Retired   Social History Main Topics  . Smoking status: Never Smoker  . Smokeless tobacco: Never Used  . Alcohol use No  . Drug use: No  . Sexual activity: Not Asked   Other Topics Concern  . None   Social History Narrative  . None    family history includes Cancer in his father; Glaucoma in his paternal aunt; Heart attack in his brother; Heart disease in his mother; Macular degeneration in his father; Multiple myeloma in his mother; Prostate cancer in his father; Uterine cancer in his mother.  Wt Readings from Last 3 Encounters:    02/09/17 204 lb (92.5 kg)  01/27/17 205 lb (93 kg)  01/12/17 207 lb 9.6 oz (94.2 kg)    PHYSICAL EXAM BP 110/68 (BP Location: Right Arm, Patient Position: Sitting, Cuff Size: Normal)   Pulse (!) 46   Ht 5' 11"  (1.803 m)   Wt 204 lb (92.5 kg)   BMI 28.45 kg/m  General appearance: alert, cooperative, appears stated age, no distress and well-nourished, well-groomed. Neck: no adenopathy, no carotid bruit and no JVD Lungs: clear to auscultation bilaterally, normal percussion bilaterally and non-labored Heart: regular rate and rhythm, S1& S2 normal, no murmur, click, rub or gallop; nondisplaced PMI. Still has occasional ectopy Abdomen: soft, non-tender; bowel sounds normal; no masses,  no organomegaly; no HJR Extremities: extremities normal, atraumatic, no cyanosis, or edema ; right radial cath site is intact. Intact pulses both radial and ulnar pulses with normal Allen sign. Pulses: 2+ and symmetric;  Skin: mobility and turgor normal, no evidence of bleeding or bruising, no lesions noted, temperature normal and texture normal or  Neurologic: Mental status: Alert, oriented, thought content appropriate; pleasant mood and affect. Normal stable gait    Adult ECG Report  Rate: 46 ;  Rhythm: sinus bradycardia, sinus arrhythmia and 1 AVB. Otherwise normal axis, intervals and durations.;   Narrative Interpretation: Heart rate is slightly lower, but otherwise stable.   Other studies Reviewed: Additional studies/ records that were reviewed today include:  Recent Labs:  - No new labs    ASSESSMENT / PLAN: Problem List Items Addressed This Visit    Abnormal nuclear stress test    Cardiac catheterization revealed severe LAD lesion, but not physiologically significant RCA lesion. PCI to LAD was done. Medical management for RCA this time.      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 He does  still have the existing stent restenosis in the RCA but this was FFR negative. We will continue medical management. I suspect that with the extent of anterior wall ischemia, there was not enough compensatory flow for the inferolateral wall did not look ischemic as well.  Plan: Continue, patient aspirin and Plavix for at least 3 months at which time we can consider stopping aspirin. He is on propranolol for beta blocker and I will defer to Dr. Curt Bears as to whether this change. He is on stable dose of statin and his labs are being monitored  by PCP.      Hyperlipidemia LDL goal <70 (Chronic)    Have been doing fairly well with statin. Labs monitored by PCP. No myalgias.      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 I think I would just continue lifelong Plavix, but we could potentially consider stopping aspirin some time either at 6 or 12 months depending on how he is doing.      Wide-complex tachycardia (HCC) (Chronic)    He'll be following up with Dr. Curt Bears next month to discuss any potential other options besides flecainide. He on propranolol with sinus bradycardia, but stable. He had 2 short episodes that may simply have been ventricular ectopy.         Current medicines are reviewed at length with the patient today. (+/- concerns) None The following changes have been made: None  Patient Instructions  NO CHANGE WITH CURRENT TREATMENT OR MEDICATIONS    Your physician wants you to follow-up in [place] Fred[facility] [place]. You will receive a reminder letter in the mail two months in advance. If you don't receive a letter, please call our office to schedule the follow-up appointment.      If you need a refill on your cardiac medications before your next appointment, please call your pharmacy.    Studies Ordered:   No orders of the defined types were placed in this encounter.     [name],  M.D., M.S. Interventional Cardiologist   Pager # [phone] Phone # [phone] [address] [place], [place] [address]

## 2017-02-11 ENCOUNTER — Encounter: Payer: Self-pay | Admitting: Cardiology

## 2017-02-11 DIAGNOSIS — Z955 Presence of coronary angioplasty implant and graft: Secondary | ICD-10-CM | POA: Insufficient documentation

## 2017-02-11 NOTE — Assessment & Plan Note (Signed)
Cardiac catheterization revealed severe LAD lesion, but not physiologically significant RCA lesion. PCI to LAD was done. Medical management for RCA this time.

## 2017-02-11 NOTE — Assessment & Plan Note (Signed)
He'll be following up with Dr. Curt Bears next month to discuss any potential other options besides flecainide. He on propranolol with sinus bradycardia, but stable. He had 2 short episodes that may simply have been ventricular ectopy.

## 2017-02-11 NOTE — Assessment & Plan Note (Addendum)
He now has overlapping DES stents in the LAD. I think I would just continue lifelong Plavix, but we could potentially consider stopping aspirin some time either at 6 or 12 months depending on how he is doing.

## 2017-02-11 NOTE — Assessment & Plan Note (Signed)
Have been doing fairly well with statin. Labs monitored by PCP. No myalgias.

## 2017-02-11 NOTE — Assessment & Plan Note (Signed)
Relatively atypical symptoms to have such a severe LAD lesion. He does still have the existing stent restenosis in the RCA but this was FFR negative. We will continue medical management. I suspect that with the extent of anterior wall ischemia, there was not enough compensatory flow for the inferolateral wall did not look ischemic as well.  Plan: Continue, patient aspirin and Plavix for at least 3 months at which time we can consider stopping aspirin. He is on propranolol for beta blocker and I will defer to Dr. Curt Bears as to whether this change. He is on stable dose of statin and his labs are being monitored by PCP.

## 2017-02-12 ENCOUNTER — Ambulatory Visit: Payer: Medicare Other | Admitting: Cardiology

## 2017-03-03 ENCOUNTER — Telehealth: Payer: Self-pay | Admitting: *Deleted

## 2017-03-03 DIAGNOSIS — Z955 Presence of coronary angioplasty implant and graft: Secondary | ICD-10-CM

## 2017-03-03 NOTE — Telephone Encounter (Signed)
Left message for  form for starting cardiac  rehab to be sent office.

## 2017-03-03 NOTE — Telephone Encounter (Signed)
-----   Message from [name], MD sent at 02/15/2017 12:20 AM EDT ----- Regarding: Cardiac Rehab Referral - [place] - forgot that Mr. Alexander Armstrong had asked about going back to [facility] [place].  Not sure how to do that .  [place]

## 2017-03-30 ENCOUNTER — Ambulatory Visit (INDEPENDENT_AMBULATORY_CARE_PROVIDER_SITE_OTHER): Payer: Medicare Other | Admitting: Cardiology

## 2017-03-30 ENCOUNTER — Other Ambulatory Visit: Payer: Self-pay

## 2017-03-30 ENCOUNTER — Other Ambulatory Visit: Payer: Self-pay | Admitting: Cardiology

## 2017-03-30 VITALS — BP 98/68 | HR 54 | Ht 71.0 in | Wt 204.6 lb

## 2017-03-30 DIAGNOSIS — R000 Tachycardia, unspecified: Secondary | ICD-10-CM

## 2017-03-30 DIAGNOSIS — I209 Angina pectoris, unspecified: Secondary | ICD-10-CM

## 2017-03-30 DIAGNOSIS — I25118 Atherosclerotic heart disease of native coronary artery with other forms of angina pectoris: Secondary | ICD-10-CM | POA: Diagnosis not present

## 2017-03-30 DIAGNOSIS — I25119 Atherosclerotic heart disease of native coronary artery with unspecified angina pectoris: Secondary | ICD-10-CM

## 2017-03-30 DIAGNOSIS — I472 Ventricular tachycardia: Secondary | ICD-10-CM | POA: Diagnosis not present

## 2017-03-30 DIAGNOSIS — E782 Mixed hyperlipidemia: Secondary | ICD-10-CM | POA: Diagnosis not present

## 2017-03-30 MED ORDER — AMIODARONE HCL 200 MG PO TABS: ORAL_TABLET | ORAL | 0 refills | Status: DC

## 2017-03-30 MED ORDER — AMIODARONE HCL 200 MG PO TABS: 200.0000 mg | ORAL_TABLET | Freq: Every day | ORAL | 1 refills | Status: DC

## 2017-03-30 NOTE — Progress Notes (Signed)
Electrophysiology Office Note   Date:  03/30/2017   ID:  Alexander Armstrong, DOB 06/15/1941, MRN [id number]  PCP:  [name], MD  Cardiologist:  [name] Primary Electrophysiologist:  Ivyrose Hashman [name], MD    Chief Complaint  Patient presents with  . Tachycardia     History of Present Illness: Alexander Armstrong is a 76 y.o. male who presents today for electrophysiology evaluation.   She has a history of STEMI status post angioplasty to the mid LAD in 2006 with a staged intervention to the RCA the same year, syncope, hyperlipidemia, wide-complex tachycardia on flecainide unable to induce VT at EP study. During his evaluation for syncope, a Linq was inserted and a wide complex tachycardia was seen. He was started on flecainide 50 mg a day. EP study in 2014 showed a normal HV interval with no inducible arrhythmias at baseline or with 28 g/m of Isuprel. A nonsustained long RP atrial tachycardia was induced. Prior to starting flecainide, he complained of intermittent episodes of palpitations dizziness and lightheadedness. At times she also was presyncopal. The cycle length of the wide complex tachycardia was 220 ms.Flecainide was stopped and his last visit due to his coronary artery disease.   Today, he denies symptoms of palpitations, chest pain, shortness of breath, orthopnea, PND, lower extremity edema, claudication, presyncope, syncope, bleeding, or neurologic sequela. The patient is tolerating medications without difficulties and is otherwise without complaint today. He has been having dizziness. The dizziness is similar to his prior episodes of dizziness that were associated with SVT or his wide complex tachycardia, which was assumed to be VT. The episode of dizziness lasted approximately 5 minutes. There are no exacerbating or alleviating factors. He simply sat down and it went away. He did have a heart cath on 01/27/17 with an LAD stent implanted.   Past Medical History:  Diagnosis Date  .  Allergy   . Arthritis   . 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 basal cell skin cancer   . CAD S/P percutaneous coronary angioplasty 11/03/2004   a. mLAD - Taxus DES 3.0 x 20; b. (11/06/04) staged PCI to RCA Taxus DES 3.5 x 16; c. (01/2017).  @ [facility] (Dr. [name]) Synergy DES 3.0 x 12 (for 99% lesion @ prox edge of old stent  . Cardiac syncope    No episode since starting flecainide. Had documented WIDE COMPLEX Tachycardia on loop recorder.  . Cataract bilateral cataracts  . Diverticulosis   . Esophageal stricture   . GERD (gastroesophageal reflux disease)   . Hiatal hernia 11/05/2010  . HLD (hyperlipidemia)   . Nonsustained paroxysmal ventricular tachycardia ([place]) 2014   Presumably controlled with flecainide. Unable to induce during EP study.  . Reflux gastritis   . STEMI involving left anterior descending coronary artery ([place]) 11/03/2004   [facility]: Occluded LAD treated with DES. Staged PCI to the RCA.   Past Surgical History:  Procedure Laterality Date  . CARDIAC CATHETERIZATION  ~2011   [facility]: re-look Cath - patent stents  . CORONARY STENT INTERVENTION N/A 01/27/2017   Procedure: Coronary Stent Intervention;  Surgeon: [name], MD;  Location: [facility]: mLAD (just after D1) overlapping prior DES -> SYNERGY DES 3X12 drug eluting stent  . ELECTROPHYSIOLOGIC STUDY  2014   Unable to stimulate VT seen on loop recorder  . implanted loop heart monitor x2  ~2011; 2014   Per report- batter out of date; apparently captured a prolonged episode of wide complex  tachycardia associated with an episode of weakness/near syncope  . INTRAVASCULAR PRESSURE WIRE/FFR STUDY N/A 01/27/2017   Procedure: Intravascular Pressure Wire/FFR Study;  Surgeon: [name], MD;  Location: [facility];  Service: Cardiovascular;  Laterality: N/A;  . LEFT HEART CATH AND CORONARY ANGIOGRAPHY N/A 01/27/2017   Procedure: Left Heart Cath and Coronary Angiography;  Surgeon: [name], MD;  Location: [facility] INVASIVE CV LAB: mLAD 99% stenosis pre-Stent & ISR --> PCI. Ost DI ~50%. pRCA DES ~50-60% FFR 0.86.  [name] NM MYOVIEW LTD  07/2014   Unremarkable EKG. No evidence of ischemia or infarct. Mild anterior attenuation, cannot exclude prior infarct, but nonreversible. EF 66%.  [name] NM MYOVIEW LTD  12/22/2016   INTERMEDIATE RISK. EF 58%. Defect 1: Medium size severe defect in the mid anterior, apical anterior and apical wall consistent with prior anterior MI. Defect to: Large defect and moderate severity in the basal inferoseptal basal inferior and inferoseptal mid inferior wall consistent with ischemia.  [name] PERCUTANEOUS CORONARY STENT INTERVENTION (PCI-S)  January-February 2006   a. mLAD - Taxus DES 3.0 x 20; b. (11/06/04) staged PCI to RCA Taxus DES 3.5 x 16; c. 01/27/2017: PCI to mLAD prox to old  stent - Synergy DES 3.0 x 12  . UMBILICAL HERNIA REPAIR       Current Outpatient Prescriptions  Medication Sig Dispense Refill  . acetaminophen (TYLENOL) 500 MG tablet Take 1,000 mg by mouth 3 (three) times daily as needed for moderate pain or headache.    [name] aspirin 81 MG tablet Take 81 mg by mouth at bedtime.     . Carboxymethylcellul-Glycerin (LUBRICATING EYE DROPS OP) Apply 1 drop to eye daily as needed (dry eyes).    . Cholecalciferol (VITAMIN D3) 1000 units CAPS Take 1,000 Units by mouth at bedtime.    . clopidogrel (PLAVIX) 75 MG tablet Take 75 mg by mouth daily.    . fexofenadine (ALLEGRA) 180 MG tablet Take 180 mg by mouth daily.     [name] ipratropium (ATROVENT) 0.03 % nasal spray Place 2 sprays into the nose daily as needed for rhinitis.     [name] oxybutynin (DITROPAN) 5 MG tablet Take 5 mg by mouth 2 (two) times daily.  3  . pantoprazole (PROTONIX) 40 MG tablet Take 40 mg by mouth daily.    . propranolol (INDERAL) 20 MG tablet Take 20 mg by mouth 2 (two) times daily.    . psyllium (REGULOID) 0.52 g capsule Take 0.52 g by mouth 2 (two) times daily.    . ranitidine (ZANTAC) 150 MG  capsule Take 150 mg by mouth daily as needed for heartburn.    . rosuvastatin (CRESTOR) 20 MG tablet Take 20 mg by mouth daily.    . tamsulosin (FLOMAX) 0.4 MG CAPS capsule Take 0.4 mg by mouth daily after supper.      No current facility-administered medications for this visit.     Allergies:   Silver sulfadiazine and Hydrocodone   Social History:  The patient  reports that he has never smoked. He has never used smokeless tobacco. He reports that he does not drink alcohol or use drugs.   Family History:  The patient's family history includes Cancer in his father; Glaucoma in his paternal aunt; Heart attack in his brother; Heart disease in his mother; Macular degeneration in his father; Multiple myeloma in his mother; Prostate cancer in his father; Uterine cancer in his mother.    ROS:  Please see the history of  present illness.   Otherwise, review of systems is positive for dizziness, easy bruising, bleeding.   All other systems are reviewed and negative.     PHYSICAL EXAM: VS:  BP 98/68   Pulse (!) 54   Ht _0  (1.803 m)   Wt 204 lb 9.6 oz (92.8 kg)   SpO2 97%   BMI 28.54 kg/m  , BMI Body mass index is 28.54 kg/m. GEN: Well nourished, well developed, in no acute distress  HEENT: normal  Neck: no JVD, carotid bruits, or masses Cardiac: RRR; no murmurs, rubs, or gallops,no edema  Respiratory:  clear to auscultation bilaterally, normal work of breathing GI: soft, nontender, nondistended, + BS MS: no deformity or atrophy  Skin: warm and dry Neuro:  Strength and sensation are intact Psych: euthymic mood, full affect  EKG:  EKG is not ordered today. Personal review of the ekg ordered 02/09/17 shows sinus rhythm, rate 46, first-degree AV block  Recent Labs: 01/21/2017: Hemoglobin 13.1; Platelets 205 01/27/2017: BUN 18; Creatinine, Ser 1.06; Potassium 4.0; Sodium 137    Lipid Panel  No results found for: CHOL, TRIG, HDL, CHOLHDL, VLDL, LDLCALC, LDLDIRECT   Wt Readings from  Last 3 Encounters:  03/30/17 204 lb 9.6 oz (92.8 kg)  02/09/17 204 lb (92.5 kg)  01/27/17 205 lb (93 kg)      Other studies Reviewed: Additional studies/ records that were reviewed today include: SPECT 12/22/16  Review of the above records today demonstrates:   The left ventricular ejection fraction is normal (55-65%).  Nuclear stress EF: 58%.  Defect 1: There is a medium defect of severe severity present in the mid anterior, apical anterior and apex location consistent with a previous anterior apical MI  Defect 2: There is a large defect of moderate severity present in the basal inferoseptal, basal inferior, mid inferoseptal, mid inferior and apical inferior location that is c/w inferior ischemia.  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  This is an intermediate risk study.   01/27/17 LHC  Mid LAD-1 lesion, 99 %stenosed - proximal to & involving the proximal portion of the previous stent.  A STENT SYNERGY DES 3X12 drug eluting stent was successfully placed, and overlaps previously placed stent.  Post intervention, there is a 0% residual stenosis.  _____________  Prox RCA-1 lesion, 55 %stenosed - In-stent Re-stenosis: FFR 0.86  The left ventricular ejection fraction is 50-55% by visual estimate.  Post intervention, there is a 0% residual stenosis.   Severe single-vessel disease severe stenosis in the LAD involving the proximal portion of the petrous the placed stent. This was treated with a single DES stent after angioplasty.  FFR negative lesion in the RCA.  I suspect that in the setting of the severe LAD lesion, this lesion was physiologically significant on the Myoview stress test. No longer significant following PCI of LAD.  ASSESSMENT AND PLAN:  1.  Coronary artery disease status post RCA and LAD interventions with stable angina: Recent LAD stent. Patient not having any current chest pain.  2. Wide complex  tachycardia: Presumed to be ventricular tachycardia as per prior EP notes. Was previously on flecainide, but has been since taken off due to coronary artery disease. Is continuing to have episodes of dizziness, the most recent lasting up to 5 minutes. We'll plan to start amiodarone. If he does have further episodes, Ashlyne Olenick plan to potentially monitor as an outpatient.  3. Hyperlipidemia: On Crestor  Current medicines are reviewed at length with  the patient today.   The patient does not have concerns regarding his medicines.  The following changes were made today: amiodarone   Labs/ tests ordered today include:  No orders of the defined types were placed in this encounter.    Disposition:   FU with [facility] 6  months  Signed, Hawthorne Day [name], MD  03/30/2017 8:23 AM     [facility] [address] [place] [address] [phone] (office) [phone] (fax)

## 2017-03-30 NOTE — Patient Instructions (Addendum)
Medication Instructions:   Your physician has recommended you make the following change in your medication:  1) START Amiodarone  - take 2 tablets (400 mg total) twice a day for two weeks, then  - take 1 tablet (200 mg total) twice a day for two weeks, then  - take 1 tablet (200 mg total) once a day  - If you need a refill on your cardiac medications before your next appointment, please call your pharmacy.   Labwork:  None ordered  Testing/Procedures:  None ordered  Follow-Up:  Your physician wants you to follow-up in: 6 months with Dr. Curt Bears.  You will receive a reminder letter in the mail two months in advance. If you don't receive a letter, please call our office to schedule the follow-up appointment.  Thank you for choosing [facility]!!   Trinidad Curet, RN [phone]  Any Other Special Instructions Will Be Listed Below (If Applicable).  Amiodarone tablets What is this medicine? AMIODARONE (a MEE oh da rone) is an antiarrhythmic drug. It helps make your heart beat regularly. Because of the side effects caused by this medicine, it is only used when other medicines have not worked. It is usually used for heartbeat problems that may be life threatening. This medicine may be used for other purposes; ask your health care provider or pharmacist if you have questions. COMMON BRAND NAME(S): Cordarone, Pacerone What should I tell my health care provider before I take this medicine? They need to know if you have any of these conditions: -liver disease -lung disease -other heart problems -thyroid disease -an unusual or allergic reaction to amiodarone, iodine, other medicines, foods, dyes, or preservatives -pregnant or trying to get pregnant -breast-feeding How should I use this medicine? Take this medicine by mouth with a glass of water. Follow the directions on the prescription label. You can take this medicine with or without food. However, you should always take it  the same way each time. Take your doses at regular intervals. Do not take your medicine more often than directed. Do not stop taking except on the advice of your doctor or health care professional. A special MedGuide will be given to you by the pharmacist with each prescription and refill. Be sure to read this information carefully each time. Talk to your pediatrician regarding the use of this medicine in children. Special care may be needed. Overdosage: If you think you have taken too much of this medicine contact a poison control center or emergency room at once. NOTE: This medicine is only for you. Do not share this medicine with others. What if I miss a dose? If you miss a dose, take it as soon as you can. If it is almost time for your next dose, take only that dose. Do not take double or extra doses. What may interact with this medicine? Do not take this medicine with any of the following medications: -abarelix -apomorphine -arsenic trioxide -certain antibiotics like erythromycin, gemifloxacin, levofloxacin, pentamidine -certain medicines for depression like amoxapine, tricyclic antidepressants -certain medicines for fungal infections like fluconazole, itraconazole, ketoconazole, posaconazole, voriconazole -certain medicines for irregular heart beat like disopyramide, dofetilide, dronedarone, ibutilide, propafenone, sotalol -certain medicines for malaria like chloroquine, halofantrine -cisapride -droperidol -haloperidol -hawthorn -maprotiline -methadone -phenothiazines like chlorpromazine, mesoridazine, thioridazine -pimozide -ranolazine -red yeast rice -vardenafil -ziprasidone This medicine may also interact with the following medications: -antiviral medicines for HIV or AIDS -certain medicines for blood pressure, heart disease, irregular heart beat -certain medicines for cholesterol like atorvastatin, cerivastatin,  lovastatin, simvastatin -certain medicines for hepatitis C  like sofosbuvir and ledipasvir; sofosbuvir -certain medicines for seizures like phenytoin -certain medicines for thyroid problems -certain medicines that treat or prevent blood clots like warfarin -cholestyramine -cimetidine -clopidogrel -cyclosporine -dextromethorphan -diuretics -fentanyl -general anesthetics -grapefruit juice -lidocaine -loratadine -methotrexate -other medicines that prolong the QT interval (cause an abnormal heart rhythm) -procainamide -quinidine -rifabutin, rifampin, or rifapentine -St. John's Wort -trazodone This list may not describe all possible interactions. Give your health care provider a list of all the medicines, herbs, non-prescription drugs, or dietary supplements you use. Also tell them if you smoke, drink alcohol, or use illegal drugs. Some items may interact with your medicine. What should I watch for while using this medicine? Your condition will be monitored closely when you first begin therapy. Often, this drug is first started in a hospital or other monitored health care setting. Once you are on maintenance therapy, visit your doctor or health care professional for regular checks on your progress. Because your condition and use of this medicine carry some risk, it is a good idea to carry an identification card, necklace or bracelet with details of your condition, medications, and doctor or health care professional. Dennis Bast may get drowsy or dizzy. Do not drive, use machinery, or do anything that needs mental alertness until you know how this medicine affects you. Do not stand or sit up quickly, especially if you are an older patient. This reduces the risk of dizzy or fainting spells. This medicine can make you more sensitive to the sun. Keep out of the sun. If you cannot avoid being in the sun, wear protective clothing and use sunscreen. Do not use sun lamps or tanning beds/booths. You should have regular eye exams before and during treatment. Call your  doctor if you have blurred vision, see halos, or your eyes become sensitive to light. Your eyes may get dry. It may be helpful to use a lubricating eye solution or artificial tears solution. If you are going to have surgery or a procedure that requires contrast dyes, tell your doctor or health care professional that you are taking this medicine. What side effects may I notice from receiving this medicine? Side effects that you should report to your doctor or health care professional as soon as possible: -allergic reactions like skin rash, itching or hives, swelling of the face, lips, or tongue -blue-gray coloring of the skin -blurred vision, seeing blue green halos, increased sensitivity of the eyes to light -breathing problems -chest pain -dark urine -fast, irregular heartbeat -feeling faint or light-headed -intolerance to heat or cold -nausea or vomiting -pain and swelling of the scrotum -pain, tingling, numbness in feet, hands -redness, blistering, peeling or loosening of the skin, including inside the mouth -spitting up blood -stomach pain -sweating -unusual or uncontrolled movements of body -unusually weak or tired -weight gain or loss -yellowing of the eyes or skin Side effects that usually do not require medical attention (report to your doctor or health care professional if they continue or are bothersome): -change in sex drive or performance -constipation -dizziness -headache -loss of appetite -trouble sleeping This list may not describe all possible side effects. Call your doctor for medical advice about side effects. You may report side effects to FDA at 1-800-FDA-1088. Where should I keep my medicine? Keep out of the reach of children. Store at room temperature between 20 and 25 degrees C (68 and 77 degrees F). Protect from light. Keep container tightly closed. Throw away any unused  medicine after the expiration date. NOTE: This sheet is a summary. It may not cover all  possible information. If you have questions about this medicine, talk to your doctor, pharmacist, or health care provider.  2018 Elsevier/Gold Standard (2013-12-25 19:48:11)

## 2017-04-01 ENCOUNTER — Telehealth: Payer: Self-pay | Admitting: Cardiology

## 2017-04-01 NOTE — Telephone Encounter (Signed)
Pt was reading up on newly rx Amiodarone and noticed it mentioned possible interaction b/t Plavix and Amiodarone. He wanted to make sure this was ok.  Educated that it was ok and we would monitor while he is on Amiodarone.  Pt thanks me for calling and explaining things to him.

## 2017-04-01 NOTE — Telephone Encounter (Signed)
New message:    Please call,question about his Amiodarone.

## 2017-04-25 ENCOUNTER — Other Ambulatory Visit: Payer: Self-pay | Admitting: Cardiology

## 2017-05-24 ENCOUNTER — Telehealth: Payer: Self-pay | Admitting: Cardiology

## 2017-05-24 NOTE — Telephone Encounter (Signed)
Left a message to call back.

## 2017-05-24 NOTE — Telephone Encounter (Signed)
New message     Pt c/o Shortness Of Breath: STAT if SOB developed within the last 24 hours or pt is noticeably SOB on the phone  1. Are you currently SOB (can you hear that pt is SOB on the phone)?  Talking to wife   2. How long have you been experiencing SOB?  A few days   3. Are you SOB when sitting or when up moving around?  Both, thinks it is getting worse over the last couple days  4. Are you currently experiencing any other symptoms?  No other symptoms right now , has had chest pressure over the the last couple days

## 2017-05-27 DIAGNOSIS — B351 Tinea unguium: Secondary | ICD-10-CM | POA: Insufficient documentation

## 2017-05-27 DIAGNOSIS — M2040 Other hammer toe(s) (acquired), unspecified foot: Secondary | ICD-10-CM | POA: Insufficient documentation

## 2017-05-27 DIAGNOSIS — M2060 Acquired deformities of toe(s), unspecified, unspecified foot: Secondary | ICD-10-CM | POA: Insufficient documentation

## 2017-05-27 NOTE — Telephone Encounter (Signed)
Spoke with pt wife, she reports trying to call back but has been unable to get through. She reports the patient continues to have SOB and occ chest pain. He is not there at present and she request we call him at the office later [phone].

## 2017-05-27 NOTE — Telephone Encounter (Signed)
Left message for pt to call.

## 2017-05-28 ENCOUNTER — Encounter: Payer: Self-pay | Admitting: *Deleted

## 2017-05-31 NOTE — Telephone Encounter (Signed)
If not already, decrease to 200 mg daily.

## 2017-05-31 NOTE — Telephone Encounter (Signed)
Spoke with pt, he was seen by dr Curt Bears in June and started on amiodarone. He is having extreme fatigue, constipation, SOB with exertion and lightheadedness that seems to be worse. The patient denies chest pain or other symptoms. He feels his symptoms are related to the amiodarone. He has a f/u with [facility] in sept and follow up with [facility] in December. Will forward for dr Curt Bears review and advise.

## 2017-05-31 NOTE — Telephone Encounter (Signed)
Follow up    Pt is calling back for nurse. Please call.

## 2017-05-31 NOTE — Telephone Encounter (Signed)
Wife tells me that pt still feels terrible. He has been taking one tablet for month now. Will re-discuss with Dr. Curt Bears tomorrow and call her back. She is agreeable to plan.

## 2017-06-01 NOTE — Telephone Encounter (Signed)
Advised to stop Amiodarone, per Dr. Curt Bears. OV made for next week to discuss other option/s. Wife is agreeable to plan.

## 2017-06-08 ENCOUNTER — Encounter: Payer: Self-pay | Admitting: Cardiology

## 2017-06-08 ENCOUNTER — Ambulatory Visit (INDEPENDENT_AMBULATORY_CARE_PROVIDER_SITE_OTHER): Payer: Medicare Other | Admitting: Cardiology

## 2017-06-08 VITALS — BP 110/76 | HR 57 | Ht 71.0 in | Wt 206.8 lb

## 2017-06-08 DIAGNOSIS — I25118 Atherosclerotic heart disease of native coronary artery with other forms of angina pectoris: Secondary | ICD-10-CM | POA: Diagnosis not present

## 2017-06-08 DIAGNOSIS — I209 Angina pectoris, unspecified: Secondary | ICD-10-CM

## 2017-06-08 DIAGNOSIS — I472 Ventricular tachycardia: Secondary | ICD-10-CM

## 2017-06-08 DIAGNOSIS — E782 Mixed hyperlipidemia: Secondary | ICD-10-CM

## 2017-06-08 DIAGNOSIS — R000 Tachycardia, unspecified: Secondary | ICD-10-CM

## 2017-06-08 DIAGNOSIS — I25119 Atherosclerotic heart disease of native coronary artery with unspecified angina pectoris: Secondary | ICD-10-CM

## 2017-06-08 MED ORDER — MEXILETINE HCL 200 MG PO CAPS: 200.0000 mg | ORAL_CAPSULE | Freq: Two times a day (BID) | ORAL | 3 refills | Status: DC

## 2017-06-08 NOTE — Progress Notes (Signed)
Electrophysiology Office Note   Date:  06/08/2017   ID:  Alexander Armstrong, DOB 20-May-1941, MRN [id number]  PCP:  [name], MD  Cardiologist:  [name] Primary Electrophysiologist:  Yaret Hush [name], MD    Chief Complaint  Patient presents with  . Follow-up    Stopped Amiodarone     History of Present Illness: Alexander Armstrong is a 76 y.o. male who presents today for electrophysiology evaluation.   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 During his evaluation for syncope, a Linq was inserted and a wide complex tachycardia was seen. He was started on flecainide 50 mg a day. EP study in 2014 showed a normal HV interval with no inducible arrhythmias at baseline or with 28 g/m of Isuprel. A nonsustained long RP atrial tachycardia was induced. Prior to starting flecainide, he complained of intermittent episodes of palpitations dizziness and lightheadedness. At times she also was presyncopal. The cycle length of the wide complex tachycardia was 220 msec. Flecainide has been stopped due to his coronary artery disease.  Today, denies symptoms of palpitations, chest pain, shortness of breath, orthopnea, PND, lower extremity edema, claudication, dizziness, presyncope, syncope, bleeding, or neurologic sequela. The patient is tolerating medications without difficulties and is otherwise without complaint today. He was taken off of his flecainide was started on amiodarone at his last visit. He had significant symptoms of fatigue weakness and shortness of breath on amiodarone which has improved since stopping the medication.    Past Medical History:  Diagnosis Date  . Allergy   . Arthritis   . 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 basal cell skin cancer   . CAD S/P percutaneous coronary angioplasty 11/03/2004   a.  mLAD - Taxus DES 3.0 x 20; b. (11/06/04) staged PCI to RCA Taxus DES 3.5 x 16; c. (01/2017).  @ [facility] (Dr. [name]) Synergy DES 3.0 x 12 (for 99% lesion @ prox edge of old stent  . Cardiac syncope    No episode since starting flecainide. Had documented WIDE COMPLEX Tachycardia on loop recorder.  . Cataract bilateral cataracts  . Diverticulosis   . Esophageal stricture   . GERD (gastroesophageal reflux disease)   . Hiatal hernia 11/05/2010  . HLD (hyperlipidemia)   . Nonsustained paroxysmal ventricular tachycardia ([place]) 2014   Presumably controlled with flecainide. Unable to induce during EP study.  . Reflux gastritis   . STEMI involving left anterior descending coronary artery ([place]) 11/03/2004   [facility]: Occluded LAD treated with DES. Staged PCI to the RCA.   Past Surgical History:  Procedure Laterality Date  . CARDIAC CATHETERIZATION  ~2011   [facility]: re-look Cath - patent stents  . CORONARY STENT INTERVENTION N/A 01/27/2017   Procedure: Coronary Stent Intervention;  Surgeon: [name], MD;  Location: [place] [facility]: mLAD (just after D1) overlapping prior DES -> SYNERGY DES 3X12 drug eluting stent  . ELECTROPHYSIOLOGIC STUDY  2014   Unable to stimulate VT seen on loop recorder  . implanted loop heart monitor x2  ~2011; 2014   Per report- batter out of date; apparently captured a prolonged episode of wide complex tachycardia associated with an episode of weakness/near syncope  . INTRAVASCULAR PRESSURE WIRE/FFR STUDY N/A 01/27/2017   Procedure: Intravascular Pressure Wire/FFR Study;  Surgeon: [name], MD;  Location: [place] [facility];  Service: Cardiovascular;  Laterality: N/A;  . LEFT HEART CATH AND CORONARY ANGIOGRAPHY N/A 01/27/2017   Procedure: Left Heart Cath and Coronary Angiography;  Surgeon: [name], MD;  Location: [facility]: mLAD 99% stenosis pre-Stent & ISR --> PCI. Ost DI ~50%. pRCA DES ~50-60% FFR 0.86.  [name] NM MYOVIEW LTD  07/2014    Unremarkable EKG. No evidence of ischemia or infarct. Mild anterior attenuation, cannot exclude prior infarct, but nonreversible. EF 66%.  [name] NM MYOVIEW LTD  12/22/2016   INTERMEDIATE RISK. EF 58%. Defect 1: Medium size severe defect in the mid anterior, apical anterior and apical wall consistent with prior anterior MI. Defect to: Large defect and moderate severity in the basal inferoseptal basal inferior and inferoseptal mid inferior wall consistent with ischemia.  [name] PERCUTANEOUS CORONARY STENT INTERVENTION (PCI-S)  January-February 2006   a. mLAD - Taxus DES 3.0 x 20; b. (11/06/04) staged PCI to RCA Taxus DES 3.5 x 16; c. 01/27/2017: PCI to mLAD prox to old  stent - Synergy DES 3.0 x 12  . UMBILICAL HERNIA REPAIR       Current Outpatient Prescriptions  Medication Sig Dispense Refill  . acetaminophen (TYLENOL) 500 MG tablet Take 1,000 mg by mouth 3 (three) times daily as needed for moderate pain or headache.    [name] aspirin 81 MG tablet Take 81 mg by mouth at bedtime.     . Carboxymethylcellul-Glycerin (LUBRICATING EYE DROPS OP) Apply 1 drop to eye daily as needed (dry eyes).    . Cholecalciferol (VITAMIN D3) 1000 units CAPS Take 1,000 Units by mouth at bedtime.    . clopidogrel (PLAVIX) 75 MG tablet Take 75 mg by mouth daily.    . fexofenadine (ALLEGRA) 180 MG tablet Take 180 mg by mouth daily.     [name] ipratropium (ATROVENT) 0.03 % nasal spray Place 2 sprays into the nose daily as needed for rhinitis.     [name] oxybutynin (DITROPAN) 5 MG tablet Take 5 mg by mouth 2 (two) times daily.  3  . pantoprazole (PROTONIX) 40 MG tablet Take 40 mg by mouth daily.    . propranolol (INDERAL) 20 MG tablet Take 20 mg by mouth 2 (two) times daily.    . psyllium (REGULOID) 0.52 g capsule Take 0.52 g by mouth 2 (two) times daily.    . ranitidine (ZANTAC) 150 MG capsule Take 150 mg by mouth daily as needed for heartburn.    . rosuvastatin (CRESTOR) 20 MG tablet Take 20 mg by mouth daily.    . tamsulosin (FLOMAX) 0.4 MG  CAPS capsule Take 0.4 mg by mouth daily after supper.      No current facility-administered medications for this visit.     Allergies:   Silver sulfadiazine and Hydrocodone   Social History:  The patient  reports that he has never smoked. He has never used smokeless tobacco. He reports that he does not drink alcohol or use drugs.   Family History:  The patient's family history includes Cancer in his father; Glaucoma in his paternal aunt; Heart attack in his brother; Heart disease in his mother; Macular degeneration in his father; Multiple myeloma in his mother; Prostate cancer in his father; Uterine cancer in his mother.    ROS:  Please see the history of present illness.   Otherwise, review of systems is positive for fatigue, shortness of breath, constipation.   All other systems are reviewed and negative.   PHYSICAL EXAM: VS:  BP 110/76   Pulse (!) 57   Ht _0  (1.803 m)   Wt 206 lb 12.8 oz (93.8 kg)   SpO2 90%   BMI 28.84 kg/m  , BMI Body mass index is 28.84 kg/m. GEN: Well nourished, well developed, in no acute distress  HEENT: normal  Neck: no JVD, carotid bruits, or masses Cardiac: RRR; no murmurs, rubs, or gallops,no edema  Respiratory:  clear to auscultation bilaterally, normal work of breathing GI: soft, nontender, nondistended, + BS MS: no deformity or atrophy  Skin: warm and dry Neuro:  Strength and sensation are intact Psych: euthymic mood, full affect  EKG:  EKG is ordered today. Personal review of the ekg ordered shows sinus rhythm, first-degree AV block, rate 57  Recent Labs: 01/21/2017: Hemoglobin 13.1; Platelets 205 01/27/2017: BUN 18; Creatinine, Ser 1.06; Potassium 4.0; Sodium 137    Lipid Panel  No results found for: CHOL, TRIG, HDL, CHOLHDL, VLDL, LDLCALC, LDLDIRECT   Wt Readings from Last 3 Encounters:  06/08/17 206 lb 12.8 oz (93.8 kg)  03/30/17 204 lb 9.6 oz (92.8 kg)  02/09/17 204 lb (92.5 kg)      Other studies Reviewed: Additional  studies/ records that were reviewed today include: SPECT 12/22/16  Review of the above records today demonstrates:   The left ventricular ejection fraction is normal (55-65%).  Nuclear stress EF: 58%.  Defect 1: There is a medium defect of severe severity present in the mid anterior, apical anterior and apex location consistent with a previous anterior apical MI  Defect 2: There is a large defect of moderate severity present in the basal inferoseptal, basal inferior, mid inferoseptal, mid inferior and apical inferior location that is c/w inferior ischemia.  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  This is an intermediate risk study.   01/27/17 [facility]  Mid LAD-1 lesion, 99 %stenosed - proximal to & involving the proximal portion of the previous stent.  A STENT SYNERGY DES 3X12 drug eluting stent was successfully placed, and overlaps previously placed stent.  Post intervention, there is a 0% residual stenosis.  _____________  Prox RCA-1 lesion, 55 %stenosed - In-stent Re-stenosis: FFR 0.86  The left ventricular ejection fraction is 50-55% by visual estimate.  Post intervention, there is a 0% residual stenosis.   Severe single-vessel disease severe stenosis in the LAD involving the proximal portion of the petrous the placed stent. This was treated with a single DES stent after angioplasty.  FFR negative lesion in the RCA.  I suspect that in the setting of the severe LAD lesion, this lesion was physiologically significant on the Myoview stress test. No longer significant following PCI of LAD.  ASSESSMENT AND PLAN:  1.  Coronary artery disease status post RCA and LAD interventions with stable angina: Recent LAD stent. No chest pain currently. Continue current antiplatelet.  2. Wide complex tachycardia: Presumed to be ventricular tachycardia prior notes. Was taken off of flecainide and unable to tolerate amiodarone due to side effects.  We'll start mexiletine 200 mg twice a day.  3. Hyperlipidemia: Continue Crestor  Current medicines are reviewed at length with the patient today.   The patient does not have concerns regarding his medicines.  The following changes were made today: Stop amiodarone start mexiletine  Labs/ tests ordered today include:  Orders Placed This Encounter  Procedures  . EKG 12-Lead     Disposition:   FU with Pinkney Venard 6  months  Signed, Levii Hairfield [name], MD  06/08/2017 2:44 PM     [address] [address] [phone] (office) [phone] (fax)

## 2017-06-08 NOTE — Patient Instructions (Addendum)
Medication Instructions:  Your physician has recommended you make the following change in your medication:  1. START Mexiletine 200 mg twice daily  If you need a refill on your cardiac medications before your next appointment, please call your pharmacy.   Labwork: None ordered  Testing/Procedures: None ordered  Follow-Up: Your physician recommends that you schedule a follow-up appointment on 9/12 @ 2:00 p.m. for nurse visit EKG.  Your physician recommends that you schedule a follow-up appointment in: 6 months with Dr. Curt Bears.   Thank you for choosing [facility]!!   Trinidad Curet, RN [phone]  Any Other Special Instructions Will Be Listed Below (If Applicable).  Mexiletine capsules What is this medicine? MEXILETINE (mex [place] e teen) is an antiarrhythmic agent. This medicine is used to treat irregular heart rhythm and can slow rapid heartbeats. It can help your heart to return to and maintain a normal rhythm. Because of the side effects caused by this medicine, it is usually used for heartbeat problems that may be life-threatening. This medicine may be used for other purposes; ask your health care provider or pharmacist if you have questions. COMMON BRAND NAME(S): Mexitil What should I tell my health care provider before I take this medicine? They need to know if you have any of these conditions: -liver disease -other heart problems -previous heart attack -an unusual or allergic reaction to mexiletine, other medicines, foods, dyes, or preservatives -pregnant or trying to get pregnant -breast-feeding How should I use this medicine? Take this medicine by mouth with a glass of water. Follow the directions on the prescription label. It is recommended that you take this medicine with food or an antacid. Take your doses at regular intervals. Do not take your medicine more often than directed. Do not stop taking except on the advice of your doctor or health care  professional. Talk to your pediatrician regarding the use of this medicine in children. Special care may be needed. Overdosage: If you think you have taken too much of this medicine contact a poison control center or emergency room at once. NOTE: This medicine is only for you. Do not share this medicine with others. What if I miss a dose? If you miss a dose, take it as soon as you can. If it is almost time for your next dose, take only that dose. Do not take double or extra doses. What may interact with this medicine? Do not take this medicine with any of the following medications: -dofetilide This medicine may also interact with the following medications: -caffeine -cimetidine -medicines for depression, anxiety, or psychotic disturbances -medicines to control heart rhythm -phenobarbital -phenytoin -rifampin -theophylline This list may not describe all possible interactions. Give your health care provider a list of all the medicines, herbs, non-prescription drugs, or dietary supplements you use. Also tell them if you smoke, drink alcohol, or use illegal drugs. Some items may interact with your medicine. What should I watch for while using this medicine? Your condition will be monitored closely when you first begin therapy. Often, this drug is first started in a hospital or other monitored health care setting. Once you are on maintenance therapy, visit your doctor or health care professional for regular checks on your progress. Because your condition and use of this medicine carry some risk, it is a good idea to carry an identification card, necklace or bracelet with details of your condition, medications, and doctor or health care professional. Dennis Bast may get drowsy or dizzy. Do not drive, use machinery,  or do anything that needs mental alertness until you know how this medicine affects you. Do not stand or sit up quickly, especially if you are an older patient. This reduces the risk of dizzy or  fainting spells. Alcohol can make you more dizzy, increase flushing and rapid heartbeats. Avoid alcoholic drinks. What side effects may I notice from receiving this medicine? Side effects that you should report to your doctor or health care professional as soon as possible: -allergic reactions like skin rash, itching or hives, swelling of the face, lips, or tongue -breathing problems -chest pain, continued irregular heartbeats -redness, blistering, peeling or loosening of the skin, including inside the mouth -seizures -skin rash -trembling, shaking -unusual bleeding or bruising -unusually weak or tired Side effects that usually do not require medical attention (report to your doctor or health care professional if they continue or are bothersome): -blurred vision -difficulty walking -heartburn -nausea, vomiting -nervousness -numbness, or tingling in the fingers or toes This list may not describe all possible side effects. Call your doctor for medical advice about side effects. You may report side effects to FDA at 1-800-FDA-1088. Where should I keep my medicine? Keep out of reach of children. Store at room temperature between 15 and 30 degrees C (59 and 86 degrees F). Throw away any unused medicine after the expiration date. NOTE: This sheet is a summary. It may not cover all possible information. If you have questions about this medicine, talk to your doctor, pharmacist, or health care provider.  2018 Elsevier/Gold Standard (2008-04-09 13:59:49)

## 2017-06-16 ENCOUNTER — Ambulatory Visit (INDEPENDENT_AMBULATORY_CARE_PROVIDER_SITE_OTHER): Payer: Medicare Other | Admitting: *Deleted

## 2017-06-16 DIAGNOSIS — R000 Tachycardia, unspecified: Secondary | ICD-10-CM

## 2017-06-16 DIAGNOSIS — I472 Ventricular tachycardia: Secondary | ICD-10-CM | POA: Diagnosis not present

## 2017-06-16 NOTE — Patient Instructions (Signed)
Patient came in today for an EKG after medication changes at recent visit with Dr. Curt Bears. Per Dr.Camnitz, EKG ok, keep medications the same, and patient to follow up in 6 months as discussed at previous office visit. Patient verbalized understanding and in agreement with plan.

## 2017-06-24 ENCOUNTER — Ambulatory Visit (INDEPENDENT_AMBULATORY_CARE_PROVIDER_SITE_OTHER): Payer: Medicare Other | Admitting: Cardiology

## 2017-06-24 ENCOUNTER — Encounter: Payer: Self-pay | Admitting: Cardiology

## 2017-06-24 VITALS — BP 112/70 | HR 61 | Ht 71.0 in | Wt 206.0 lb

## 2017-06-24 DIAGNOSIS — R55 Syncope and collapse: Secondary | ICD-10-CM | POA: Diagnosis not present

## 2017-06-24 DIAGNOSIS — R000 Tachycardia, unspecified: Secondary | ICD-10-CM

## 2017-06-24 DIAGNOSIS — I25119 Atherosclerotic heart disease of native coronary artery with unspecified angina pectoris: Secondary | ICD-10-CM | POA: Diagnosis not present

## 2017-06-24 DIAGNOSIS — I472 Ventricular tachycardia: Secondary | ICD-10-CM

## 2017-06-24 DIAGNOSIS — I209 Angina pectoris, unspecified: Secondary | ICD-10-CM | POA: Diagnosis not present

## 2017-06-24 DIAGNOSIS — E785 Hyperlipidemia, unspecified: Secondary | ICD-10-CM

## 2017-06-24 NOTE — Patient Instructions (Addendum)
Medications  Do not take Mexitil 200 mg for 6 weeks   It will be around Aug 09 2017. You will wear a monitor for 30 days then start taking Mexitil  200 mg twice a day.  STARTING THE WEEK OF NOV 5 ,2018 Your physician has recommended that you wear an event monitor 30 DAYS. Event monitors are medical devices that record the heart's electrical activity. Doctors most often [place] these monitors to diagnose arrhythmias. Arrhythmias are problems with the speed or rhythm of the heartbeat. The monitor is a small, portable device. You can wear one while you do your normal daily activities. This is usually used to diagnose what is causing palpitations/syncope (passing out).    Your physician recommends that you schedule a follow-up appointment in 3 MONTHS

## 2017-06-24 NOTE — Progress Notes (Signed)
PCP: [name], MD  Clinic Note: Chief Complaint  Patient presents with  . Chest Pain    pt states having some pressure two or three days ago, states he thinks its a medication that caused the pain in his chest.   . Shortness of Breath    a little bit   . Follow-up    CAD    HPI: Alexander Armstrong is a 76 y.o. male with a PMH below who presents today for Four-month follow-up For CAD-PCI as well as a history of wide complex tachycardia. He recently switched his care over to me from [facility] where he was followed by Dr. Elonda Husky. 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 He had nonischemic Myoview in 2015. He had EP evaluation for dizziness and syncope - thought to be related to wide complex tachycardia and was unable to induce VT or VF - REVEAL LINQ Loop recorder inserted (& replaced) -- , and was started on flecainide 50 mg daily after an episode of Sustained VT noted on the monitor. --> He had been maintained on oral flecainide, but has been noted to have multiple different syncopal episodes were the only this one episode of sustained VT noted. The flecainide to be working pretty well, but with his CAD, this was thought to be an unsafe option. I first saw Alexander Armstrong as the patient in March 2018 (his wife [name] is a patient of mine) to establish cardiology care. We actually did a stress test for routine evaluation that was positive and he ended up undergoing cardiac catheterization with PCI to the LAD.  Alexander Armstrong was last seen on May 8 in follow-up from his heart catheterization and PCI. Interestingly, he really didn't feel much difference from before and after the PCI. He noted little better energy improvement. He still had a couple episodes of strange near syncope and so I referred him to Dr. Curt Bears for evaluation. He has been seen now by Dr. Curt Bears in June and then recently earlier this month. The initial plan was  to convert from flecainide to amiodarone which he did not tolerate. During the last visit Cam[place]itz stopped amiodarone and put him on mexiletine.  Recent Hospitalizations: None  Studies Personally Reviewed - (if available, images/films reviewed: From Epic Chart or Care Everywhere)  No new studies  Interval History: Alexander Armstrong presents today finally starting to feel a little bit like himself over the last 2-3 days. He stopped taking mexiletine because he started feeling horrible. 3 days ago he started having extreme fatigue, aching all over, nausea as well as even some chest pressure. He noted his heart rate was in the 40s and he just felt exhausted. He decided to stop taking mexiletine and now 2 days out since stopping it, he is "75% "better. He still feels tired. No further chest discomfort however. He does indicate that he really hasn't stopped his routine. He has not had any further of his past episodes or even any of the warning symptoms for pass out. What he describes is a sense of feeling weak, lightheaded and dizzy. He starts to feel warm all over, and is usually able to sit down and relax enough to allow the symptoms to abate, but sometimes he doesn't make it and he will pass out. This has not occurred recently. He really has not had any resting or exertional chest tightness or pressure besides the episode noted  above. No PND, orthopnea or edema  No recurrent syncope/near syncope. No TIA/amaurosis fugax symptoms. No melena, hematochezia, hematuria, or epstaxis. No claudication.  ROS: A comprehensive was performed. Review of Systems  Constitutional: Positive for malaise/fatigue (Feeling better now).  HENT: Negative for nosebleeds.   Respiratory: Positive for shortness of breath (Only during his episode 2 days ago). Negative for cough and wheezing.   Gastrointestinal: Positive for abdominal pain and nausea. Negative for blood in stool, melena and vomiting.  Genitourinary: Negative for  hematuria.  Musculoskeletal: Negative for falls, joint pain and neck pain.  Neurological: Positive for dizziness (During the episode 2-3days ago) and weakness.  All other systems reviewed and are negative.  I have reviewed and (if needed) personally updated the patient's problem list, medications, allergies, past medical and surgical history, social and family history.   Past Medical History:  Diagnosis Date  . Allergy   . Arthritis   . 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 basal cell skin cancer   . CAD S/P percutaneous coronary angioplasty 11/03/2004   a. mLAD - Taxus DES 3.0 x 20; b. (11/06/04) staged PCI to RCA Taxus DES 3.5 x 16; c. (01/2017).  @ [facility] (Dr. [name]) Synergy DES 3.0 x 12 (for 99% lesion @ prox edge of old stent  . Cardiac syncope    No episode since starting flecainide. Had documented WIDE COMPLEX Tachycardia on loop recorder.  . Cataract bilateral cataracts  . Diverticulosis   . Esophageal stricture   . GERD (gastroesophageal reflux disease)   . Hiatal hernia 11/05/2010  . HLD (hyperlipidemia)   . 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 Unable to induce during EP study.  . Reflux gastritis   . STEMI involving left anterior descending coronary artery ([place]) 11/03/2004   [facility]: Occluded LAD treated with DES. Staged PCI to the RCA.    Past Surgical History:  Procedure Laterality Date  . CARDIAC CATHETERIZATION  ~2011   [facility]: re-look Cath - patent stents  . CORONARY STENT INTERVENTION N/A 01/27/2017   Procedure: Coronary Stent Intervention;  Surgeon: [name], MD;  Location: [place] CV LAB: mLAD (just after D1) overlapping prior DES -> SYNERGY DES 3X12 drug eluting stent  . ELECTROPHYSIOLOGIC STUDY  2014   Unable to stimulate VT seen on loop recorder  . implanted loop heart monitor x2  ~2011; 2014   Per report- batter out of date; apparently captured a prolonged episode of  wide complex tachycardia associated with an episode of weakness/near syncope  . INTRAVASCULAR PRESSURE WIRE/FFR STUDY N/A 01/27/2017   Procedure: Intravascular Pressure Wire/FFR Study;  Surgeon: [name], MD;  Location: [place] [facility];  Service: Cardiovascular;  Laterality: N/A;  . LEFT HEART CATH AND CORONARY ANGIOGRAPHY N/A 01/27/2017   Procedure: Left Heart Cath and Coronary Angiography;  Surgeon: [name], MD;  Location: [facility] INVASIVE [facility] LAB: mLAD 99% stenosis pre-Stent & ISR --> PCI. Ost DI ~50%. pRCA DES ~50-60% FFR 0.86.  [name] NM MYOVIEW LTD  07/2014   Unremarkable EKG. No evidence of ischemia or infarct. Mild anterior attenuation, cannot exclude prior infarct, but nonreversible. EF 66%.  [name] NM MYOVIEW LTD  12/22/2016   INTERMEDIATE RISK. EF 58%. Defect 1: Medium size severe defect in the mid anterior, apical anterior and apical wall consistent with prior anterior MI. Defect to: Large defect and moderate severity in the basal inferoseptal basal inferior and inferoseptal mid inferior wall consistent with  ischemia.  [name] PERCUTANEOUS CORONARY STENT INTERVENTION (PCI-S)  January-February 2006   a. mLAD - Taxus DES 3.0 x 20; b. (11/06/04) staged PCI to RCA Taxus DES 3.5 x 16; c. 01/27/2017: PCI to mLAD prox to old  stent - Synergy DES 3.0 x 12  . UMBILICAL HERNIA REPAIR      Diagnostic Diagram                                                            Post-Intervention Diagram -  RCA FFR 0.86    LAD PCI: Synergy DES 3.0 x 12 mm overlapping proximal edge of previous stent               Current Meds  Medication Sig  . acetaminophen (TYLENOL) 500 MG tablet Take 1,000 mg by mouth 3 (three) times daily as needed for moderate pain or headache.  [name] aspirin 81 MG tablet Take 81 mg by mouth at bedtime.   . Cholecalciferol (VITAMIN D3) 1000 units CAPS Take 1,000 Units by mouth at bedtime.  . clopidogrel (PLAVIX) 75 MG tablet Take 75 mg by mouth daily.  . fexofenadine (ALLEGRA) 180 MG tablet  Take 180 mg by mouth daily.   [name] ipratropium (ATROVENT) 0.03 % nasal spray Place 2 sprays into the nose daily as needed for rhinitis.   [name] mexiletine (MEXITIL) 200 MG capsule Take 1 capsule (200 mg total) by mouth 2 (two) times daily.  [name] oxybutynin (DITROPAN) 5 MG tablet Take 5 mg by mouth 2 (two) times daily.  . pantoprazole (PROTONIX) 40 MG tablet Take 40 mg by mouth daily.  . propranolol (INDERAL) 20 MG tablet Take 20 mg by mouth 2 (two) times daily.  . psyllium (REGULOID) 0.52 g capsule Take 0.52 g by mouth 2 (two) times daily.  . ranitidine (ZANTAC) 150 MG capsule Take 150 mg by mouth daily as needed for heartburn.  . rosuvastatin (CRESTOR) 20 MG tablet Take 20 mg by mouth daily.  . tamsulosin (FLOMAX) 0.4 MG CAPS capsule Take 0.4 mg by mouth daily after supper.     Allergies  Allergen Reactions  . Silver Sulfadiazine Rash and Hives  . Hydrocodone Rash    Social History   Social History  . Marital status: Married    Spouse name: N/A  . Number of children: 1  . Years of education: N/A   Occupational History  . furniture Retired   Social History Main Topics  . Smoking status: Never Smoker  . Smokeless tobacco: Never Used  . Alcohol use No  . Drug use: No  . Sexual activity: Not Asked   Other Topics Concern  . None   Social History Narrative  . None    family history includes Cancer in his father; Glaucoma in his paternal aunt; Heart attack in his brother; Heart disease in his mother; Macular degeneration in his father; Multiple myeloma in his mother; Prostate cancer in his father; Uterine cancer in his mother.  Wt Readings from Last 3 Encounters:  06/24/17 206 lb (93.4 kg)  06/08/17 206 lb 12.8 oz (93.8 kg)  03/30/17 204 lb 9.6 oz (92.8 kg)    PHYSICAL EXAM BP 112/70   Pulse 61   Ht 5' 11" (1.803 m)   Wt 206 lb (93.4 kg)   SpO2 95%  BMI 28.73 kg/m  Physical Exam  Constitutional: He is oriented to person, place, and time. He appears well-developed and  well-nourished. No distress.  Well-groomed. Appears younger than stated age  HENT:  Head: Normocephalic and atraumatic.  Eyes: EOM are normal.  Neck: Normal range of motion. Neck supple. No hepatojugular reflux and no JVD present. Carotid bruit is not present.  Cardiovascular: Normal rate, regular rhythm, normal heart sounds and intact distal pulses.   No extrasystoles are present. PMI is not displaced.  Exam reveals no gallop and no friction rub.   No murmur heard. Pulmonary/Chest: Effort normal and breath sounds normal. No respiratory distress. He has no wheezes. He has no rales.  Abdominal: Soft. Bowel sounds are normal. He exhibits no distension. There is no tenderness. There is no rebound and no guarding.  Musculoskeletal: Normal range of motion. He exhibits no edema.  Lymphadenopathy:    He has no cervical adenopathy.  Neurological: He is alert and oriented to person, place, and time.  Skin: Skin is warm and dry. No rash noted. No erythema. No pallor.  Psychiatric: He has a normal mood and affect. His behavior is normal. Judgment and thought content normal.  Nursing note and vitals reviewed.    Adult ECG Report None  Other studies Reviewed: Additional studies/ records that were reviewed today include:  Recent Labs:   No results found for: CHOL, HDL, LDLCALC, LDLDIRECT, TRIG, CHOLHDL- PCP checks  Long discussion about his symptoms and different treatment options as well as plan going forward. 45 minutes spent with the patient. Greater than 50% time spent in direct consultation.   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 He does have existing RCA disease but that was FFR negative. Need to continue to monitor. He did have some chest discomfort but that was in the setting of probable low blood pressure and low heart rate. No  further symptoms now. Plan: Continue with aspirin and Plavix for now along with atenolol & Crestor.      Relevant Orders   Cardiac event monitor   Cardiac syncope (Chronic)    No further spells. I discussed this with Dr. Curt Bears and very difficult to know what it is worth treating. Only one episode of wide topics tachycardia was noted on his loop recorder in 3 years. His previous cardiologist was unable to trigger a wide-complex tachycardia during EP study.  I would have him wear a monitor while restarting mexiletine. It may not be a bad idea to re-insert a loop recorder in case one of these episodes occurs again, in which case I would probably recommend an ICD.       Relevant Orders   Cardiac event monitor   Hyperlipidemia LDL goal <70 (Chronic)    Labs monitored by PCP. On statin.      Wide-complex tachycardia (HCC) - Primary (Chronic)    Taken off flecainide concerns for proarrhythmia with CAD. Intolerant of amiodarone and now mexiletine.  Continue beta blocker. Plan for now will be to hold mexiletine for 6 weeks. It is possible that he still had some amiodarone on board and was profoundly bradycardic MS related to his symptoms.  He will restart mexiletine in roughly 6 weeks and we will have him wear a 30 day monitor to evaluate for bradycardia or tachycardia episodes. He will otherwise continue propranolol at current  dose (reluctant to increase it further with heart rate 61 BPM)      Relevant Orders   Cardiac event monitor      Current medicines are reviewed at length with the patient today. (+/- concerns) Concern for side effect from mexiletine The following changes have been made: See below  Patient Instructions  Medications  Do not take Mexitil 200 mg for 6 weeks   It will be around Aug 09 2017. You will wear a monitor for 30 days then start taking Mexitil  200 mg twice a day.  STARTING THE WEEK OF NOV 5 ,2018 Your physician has recommended that you wear an event  monitor 30 DAYS. Event monitors are medical devices that record the heart's electrical activity. Doctors most often [place] these monitors to diagnose arrhythmias. Arrhythmias are problems with the speed or rhythm of the heartbeat. The monitor is a small, portable device. You can wear one while you do your normal daily activities. This is usually used to diagnose what is causing palpitations/syncope (passing out).    Your physician recommends that you schedule a follow-up appointment in 3 MONTHS       Studies Ordered:   Orders Placed This Encounter  Procedures  . Cardiac event monitor      [name], M.D., M.S. Interventional Cardiologist   Pager # [phone] Phone # [phone] [address] [place], [place] [address]

## 2017-06-26 ENCOUNTER — Encounter: Payer: Self-pay | Admitting: Cardiology

## 2017-06-26 NOTE — Assessment & Plan Note (Signed)
Labs monitored by PCP. On statin.

## 2017-06-26 NOTE — Assessment & Plan Note (Signed)
Taken off flecainide concerns for proarrhythmia with CAD. Intolerant of amiodarone and now mexiletine.  Continue beta blocker. Plan for now will be to hold mexiletine for 6 weeks. It is possible that he still had some amiodarone on board and was profoundly bradycardic MS related to his symptoms.  He will restart mexiletine in roughly 6 weeks and we will have him wear a 30 day monitor to evaluate for bradycardia or tachycardia episodes. He will otherwise continue propranolol at current dose (reluctant to increase it further with heart rate 61 BPM)

## 2017-06-26 NOTE — Assessment & Plan Note (Signed)
No further spells. I discussed this with Dr. Curt Bears and very difficult to know what it is worth treating. Only one episode of wide topics tachycardia was noted on his loop recorder in 3 years. His previous cardiologist was unable to trigger a wide-complex tachycardia during EP study.  I would have him wear a monitor while restarting mexiletine. It may not be a bad idea to re-insert a loop recorder in case one of these episodes occurs again, in which case I would probably recommend an ICD.

## 2017-06-26 NOTE — Assessment & Plan Note (Signed)
Atypical symptoms to have such a severe LAD lesion. He does have existing RCA disease but that was FFR negative. Need to continue to monitor. He did have some chest discomfort but that was in the setting of probable low blood pressure and low heart rate. No further symptoms now. Plan: Continue with aspirin and Plavix for now along with atenolol & Crestor.

## 2017-08-09 ENCOUNTER — Ambulatory Visit (INDEPENDENT_AMBULATORY_CARE_PROVIDER_SITE_OTHER): Payer: Medicare Other

## 2017-08-09 DIAGNOSIS — R55 Syncope and collapse: Secondary | ICD-10-CM | POA: Diagnosis not present

## 2017-08-09 DIAGNOSIS — I25119 Atherosclerotic heart disease of native coronary artery with unspecified angina pectoris: Secondary | ICD-10-CM | POA: Diagnosis not present

## 2017-08-09 DIAGNOSIS — I472 Ventricular tachycardia: Secondary | ICD-10-CM

## 2017-08-17 ENCOUNTER — Telehealth: Payer: Self-pay | Admitting: Cardiology

## 2017-08-17 NOTE — Telephone Encounter (Signed)
New message    Patient calling to clarify when he needed  to resume taking medication while wearing even monitor. Please call   Pt c/o medication issue:  1. Name of Medication: mexiletine (MEXITIL) 200 MG capsule  2. How are you currently taking this medication (dosage and times per day)? Take 1 capsule (200 mg total) by mouth 2 (two) times daily.  3. Are you having a reaction (difficulty breathing--STAT)? No  4. What is your medication issue? Patient started taking medication 3 days ago, unsure is he was to continue taking while on event monitor.

## 2017-08-17 NOTE — Telephone Encounter (Signed)
Follow up    Patient calling back regarding previous note. Please call

## 2017-08-17 NOTE — Telephone Encounter (Signed)
Returned the call to the patient. Per Dr. Allison Quarry office visit note  I would have him wear a monitor while restarting mexiletine.   The patient verbalized his understanding.

## 2017-09-07 ENCOUNTER — Other Ambulatory Visit: Payer: Self-pay | Admitting: Cardiology

## 2017-09-22 ENCOUNTER — Encounter: Payer: Self-pay | Admitting: Cardiology

## 2017-09-22 ENCOUNTER — Ambulatory Visit (INDEPENDENT_AMBULATORY_CARE_PROVIDER_SITE_OTHER): Payer: Medicare Other | Admitting: Cardiology

## 2017-09-22 VITALS — BP 126/75 | HR 50 | Ht 71.0 in | Wt 207.0 lb

## 2017-09-22 DIAGNOSIS — I2510 Atherosclerotic heart disease of native coronary artery without angina pectoris: Secondary | ICD-10-CM | POA: Diagnosis not present

## 2017-09-22 DIAGNOSIS — I472 Ventricular tachycardia: Secondary | ICD-10-CM | POA: Diagnosis not present

## 2017-09-22 DIAGNOSIS — R000 Tachycardia, unspecified: Secondary | ICD-10-CM

## 2017-09-22 DIAGNOSIS — E785 Hyperlipidemia, unspecified: Secondary | ICD-10-CM | POA: Diagnosis not present

## 2017-09-22 DIAGNOSIS — I25119 Atherosclerotic heart disease of native coronary artery with unspecified angina pectoris: Secondary | ICD-10-CM

## 2017-09-22 NOTE — Patient Instructions (Signed)

## 2017-09-22 NOTE — Progress Notes (Addendum)
Electrophysiology Office Note   Date:  09/22/2017   ID:  Alexander Armstrong, DOB 1941-05-08, MRN [id number]  PCP:  [name], MD  Cardiologist:  [name] Primary Electrophysiologist:  Will [name], MD    Chief Complaint  Patient presents with  . Follow-up    Wide-complex tachycardia/sSyncope     History of Present Illness: Alexander Armstrong is a 76 y.o. male who presents today for electrophysiology evaluation.   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 During his evaluation for syncope, a Linq was inserted and a wide complex tachycardia was seen. He was started on flecainide 50 mg a day. EP study in 2014 showed a normal HV interval with no inducible arrhythmias at baseline or with 28 g/m of Isuprel. A nonsustained long RP atrial tachycardia was induced. Prior to starting flecainide, he complained of intermittent episodes of palpitations dizziness and lightheadedness. At times she also was presyncopal. The cycle length of the wide complex tachycardia was 220 msec. Flecainide has been stopped due to his coronary artery disease. He was started on amiodarone but did not tolerate this. Was recently started on Mexetiline.  Today, denies symptoms of palpitations, chest pain, shortness of breath, orthopnea, PND, lower extremity edema, claudication, dizziness, presyncope, syncope, bleeding, or neurologic sequela. The patient is tolerating medications without difficulties.  Noted any further episodes since being on the mexiletine.  Amiodarone was stopped due to side effects.  He feels well without major complaint.     Past Medical History:  Diagnosis Date  . Allergy   . Arthritis   . 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 basal cell skin cancer   . CAD S/P percutaneous coronary angioplasty 11/03/2004   a.  mLAD - Taxus DES 3.0 x 20; b. (11/06/04) staged PCI to RCA Taxus DES 3.5 x 16; c. (01/2017).  @ [facility] (Dr. [name]) Synergy DES 3.0 x 12 (for 99% lesion @ prox edge of old stent  . Cardiac syncope    No episode since starting flecainide. Had documented WIDE COMPLEX Tachycardia on loop recorder.  . Cataract bilateral cataracts  . Diverticulosis   . Esophageal stricture   . GERD (gastroesophageal reflux disease)   . Hiatal hernia 11/05/2010  . HLD (hyperlipidemia)   . Nonsustained paroxysmal ventricular tachycardia ([place]) 2014   Presumably controlled with flecainide. Unable to induce during EP study.  . Reflux gastritis   . STEMI involving left anterior descending coronary artery ([place]) 11/03/2004   [facility]: Occluded LAD treated with DES. Staged PCI to the RCA.   Past Surgical History:  Procedure Laterality Date  . CARDIAC CATHETERIZATION  ~2011   [facility]: re-look Cath - patent stents  . CORONARY STENT INTERVENTION N/A 01/27/2017   Procedure: Coronary Stent Intervention;  Surgeon: [name], MD;  Location: [facility]: mLAD (just after D1) overlapping prior DES -> SYNERGY DES 3X12 drug eluting stent  . ELECTROPHYSIOLOGIC STUDY  2014   Unable to stimulate VT seen on loop recorder  . implanted loop heart monitor x2  ~2011; 2014   Per report- batter out of date; apparently captured a prolonged episode of wide complex tachycardia associated with an episode of weakness/near syncope  . INTRAVASCULAR PRESSURE WIRE/FFR STUDY N/A 01/27/2017   Procedure: Intravascular Pressure Wire/FFR Study;  Surgeon: [name], MD;  Location: [facility];  Service: Cardiovascular;  Laterality: N/A;  . LEFT HEART CATH AND CORONARY ANGIOGRAPHY N/A 01/27/2017   Procedure: Left Heart Cath and Coronary Angiography;  Surgeon: [name], MD;  Location: [facility] INVASIVE CV LAB: mLAD 99% stenosis pre-Stent & ISR --> PCI. Ost DI ~50%. pRCA DES ~50-60% FFR 0.86.  [name] NM MYOVIEW LTD  07/2014    Unremarkable EKG. No evidence of ischemia or infarct. Mild anterior attenuation, cannot exclude prior infarct, but nonreversible. EF 66%.  [name] NM MYOVIEW LTD  12/22/2016   INTERMEDIATE RISK. EF 58%. Defect 1: Medium size severe defect in the mid anterior, apical anterior and apical wall consistent with prior anterior MI. Defect to: Large defect and moderate severity in the basal inferoseptal basal inferior and inferoseptal mid inferior wall consistent with ischemia.  [name] PERCUTANEOUS CORONARY STENT INTERVENTION (PCI-S)  January-February 2006   a. mLAD - Taxus DES 3.0 x 20; b. (11/06/04) staged PCI to RCA Taxus DES 3.5 x 16; c. 01/27/2017: PCI to mLAD prox to old  stent - Synergy DES 3.0 x 12  . UMBILICAL HERNIA REPAIR       Current Outpatient Medications  Medication Sig Dispense Refill  . acetaminophen (TYLENOL) 500 MG tablet Take 1,000 mg by mouth 3 (three) times daily as needed for moderate pain or headache.    [name] aspirin 81 MG tablet Take 81 mg by mouth at bedtime.     . Cholecalciferol (VITAMIN D3) 1000 units CAPS Take 1,000 Units by mouth at bedtime.    . clopidogrel (PLAVIX) 75 MG tablet Take 75 mg by mouth daily.    . fexofenadine (ALLEGRA) 180 MG tablet Take 180 mg by mouth daily.     [name] ipratropium (ATROVENT) 0.03 % nasal spray Place 2 sprays into the nose daily as needed for rhinitis.     [name] mexiletine (MEXITIL) 200 MG capsule TAKE 1 CAPSULE(200 MG) BY MOUTH TWICE DAILY 180 capsule 2  . oxybutynin (DITROPAN) 5 MG tablet Take 5 mg by mouth 2 (two) times daily.  3  . pantoprazole (PROTONIX) 40 MG tablet Take 40 mg by mouth daily.    . propranolol (INDERAL) 20 MG tablet Take 20 mg by mouth 2 (two) times daily.    . psyllium (REGULOID) 0.52 g capsule Take 0.52 g by mouth 2 (two) times daily.    . ranitidine (ZANTAC) 150 MG capsule Take 150 mg by mouth daily as needed for heartburn.    . rosuvastatin (CRESTOR) 20 MG tablet Take 20 mg by mouth daily.    . tamsulosin (FLOMAX) 0.4 MG CAPS capsule  Take 0.4 mg by mouth daily after supper.      No current facility-administered medications for this visit.     Allergies:   Silver sulfadiazine; Adhesive [tape]; and Hydrocodone   Social History:  The patient  reports that  has never smoked. he has never used smokeless tobacco. He reports that he does not drink alcohol or use drugs.   Family History:  The patient's family history includes Cancer in his father; Glaucoma in his paternal aunt; Heart attack in his brother; Heart disease in his mother; Macular degeneration in his father; Multiple myeloma in his mother; Prostate cancer in his father; Uterine cancer in his mother.    ROS:  Please see the history of present illness.   Otherwise, review of systems is positive for none.   All other systems are reviewed and negative.   PHYSICAL EXAM: VS:  BP 126/75  Pulse (!) 50   Ht 5' 11"  (1.803 m)   Wt 207 lb (93.9 kg)   BMI 28.87 kg/m  , BMI Body mass index is 28.87 kg/m. GEN: Well nourished, well developed, in no acute distress  HEENT: normal  Neck: no JVD, carotid bruits, or masses Cardiac: RRR; no murmurs, rubs, or gallops,no edema  Respiratory:  clear to auscultation bilaterally, normal work of breathing GI: soft, nontender, nondistended, + BS MS: no deformity or atrophy  Skin: warm and dry Neuro:  Strength and sensation are intact Psych: euthymic mood, full affect  EKG:  EKG is ordered today. Personal review of the ekg ordered shows SR, 1 degree AV block, rate 50   Recent Labs: 01/21/2017: Hemoglobin 13.1; Platelets 205 01/27/2017: BUN 18; Creatinine, Ser 1.06; Potassium 4.0; Sodium 137    Lipid Panel  No results found for: CHOL, TRIG, HDL, CHOLHDL, VLDL, LDLCALC, LDLDIRECT   Wt Readings from Last 3 Encounters:  09/22/17 207 lb (93.9 kg)  06/24/17 206 lb (93.4 kg)  06/08/17 206 lb 12.8 oz (93.8 kg)      Other studies Reviewed: Additional studies/ records that were reviewed today include: SPECT 12/22/16  Review of the  above records today demonstrates:   The left ventricular ejection fraction is normal (55-65%).  Nuclear stress EF: 58%.  Defect 1: There is a medium defect of severe severity present in the mid anterior, apical anterior and apex location consistent with a previous anterior apical MI  Defect 2: There is a large defect of moderate severity present in the basal inferoseptal, basal inferior, mid inferoseptal, mid inferior and apical inferior location that is c/w inferior ischemia.  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  This is an intermediate risk study.   01/27/17 [facility]  Mid LAD-1 lesion, 99 %stenosed - proximal to & involving the proximal portion of the previous stent.  A STENT SYNERGY DES 3X12 drug eluting stent was successfully placed, and overlaps previously placed stent.  Post intervention, there is a 0% residual stenosis.  _____________  Prox RCA-1 lesion, 55 %stenosed - In-stent Re-stenosis: FFR 0.86  The left ventricular ejection fraction is 50-55% by visual estimate.  Post intervention, there is a 0% residual stenosis.   Severe single-vessel disease severe stenosis in the LAD involving the proximal portion of the petrous the placed stent. This was treated with a single DES stent after angioplasty.  FFR negative lesion in the RCA.  I suspect that in the setting of the severe LAD lesion, this lesion was physiologically significant on the Myoview stress test. No longer significant following PCI of LAD.  ASSESSMENT AND PLAN:  1.  Coronary artery disease status post RCA and LAD interventions with stable angina: Current chest pain.  Continue antiplatelets.  Feeling well.  2. Wide complex tachycardia: Presumed to be ventricular tachycardia per notes.  Was taken off of flecainide and unable to tolerate amiodarone due to side effects.  Continue mexiletine. Quinidine would not be appropriate as it is off label for ventricular  arrhythmias. Propafenone not appropriate due to CAD, sotalol not appropriate due to history first degree AV block and risk of heart block. Patient also already on propranolol and would not want to add multiple beta blockers.  3. Hyperlipidemia: Continue Crestor.  Current medicines are reviewed at length with the patient today.   The patient does not have concerns regarding his medicines.  The following changes were made today: none  Labs/ tests ordered today include:  Orders  Placed This Encounter  Procedures  . EKG 12-Lead     Disposition:   FU with Will Camnitz 6  months  Signed, Will [name], MD  09/22/2017 2:21 PM     [address] [place] [address] [phone] (office) [phone] (fax)

## 2017-09-23 ENCOUNTER — Encounter: Payer: Self-pay | Admitting: Cardiology

## 2017-09-23 ENCOUNTER — Ambulatory Visit (INDEPENDENT_AMBULATORY_CARE_PROVIDER_SITE_OTHER): Payer: Medicare Other | Admitting: Cardiology

## 2017-09-23 VITALS — BP 120/68 | HR 80 | Ht 71.0 in | Wt 207.0 lb

## 2017-09-23 DIAGNOSIS — I472 Ventricular tachycardia: Secondary | ICD-10-CM

## 2017-09-23 DIAGNOSIS — E785 Hyperlipidemia, unspecified: Secondary | ICD-10-CM | POA: Diagnosis not present

## 2017-09-23 DIAGNOSIS — I25119 Atherosclerotic heart disease of native coronary artery with unspecified angina pectoris: Secondary | ICD-10-CM

## 2017-09-23 DIAGNOSIS — R002 Palpitations: Secondary | ICD-10-CM | POA: Diagnosis not present

## 2017-09-23 DIAGNOSIS — I2510 Atherosclerotic heart disease of native coronary artery without angina pectoris: Secondary | ICD-10-CM | POA: Diagnosis not present

## 2017-09-23 DIAGNOSIS — Z955 Presence of coronary angioplasty implant and graft: Secondary | ICD-10-CM

## 2017-09-23 DIAGNOSIS — R55 Syncope and collapse: Secondary | ICD-10-CM

## 2017-09-23 DIAGNOSIS — R000 Tachycardia, unspecified: Secondary | ICD-10-CM

## 2017-09-23 NOTE — Progress Notes (Signed)
PCP: Alexander Armstrong., MD  Clinic Note: Chief Complaint  Patient presents with  . Follow-up    cough, congestion  . Coronary Artery Disease    HPI: Alexander Armstrong is a 76 y.o. male with a PMH below who presents today for Four-month follow-up For CAD-PCI as well as a history of wide complex tachycardia. De has been a long-term patient of Dr. Elonda Armstrong based out of [facility].  He has a history of Anterior STEMI status post PCI to the LAD followed by staged PCI to the RCA in 2006.  He had a Myoview in 2015 that was negative for ischemia.  He had a an episode of dizziness and syncope and was found to have a wide-complex tachycardia found near the end of life of a loop recorder.  He had been maintained on flecainide for many years, but continued to note episodes of syncope.  Flecainide was however working relatively well, but with his existing CAD, not perhaps the best option.  I first saw Alexander Armstrong as the patient in March 2018 (his wife Alexander Armstrong is a patient of mine) to establish cardiology care. We actually did a stress test for routine evaluation that was positive and he ended up undergoing cardiac catheterization with PCI to the LAD.  Despite having the abnormal stress test and PCI, Alexander Armstrong never really noted having any true anginal symptoms just fatigue.  He did note improved energy after his PCI.  I have referred over to Dr. Curt Armstrong from elective physiology.  I last saw him on September 20.  He was doing fairly well for the most part, but really felt poorly when he first started taking mexiletine.  He felt chest pressure palpitations nausea etc.  After stopping he started feeling better.  By the time I saw him he was doing well.  He subsequently followed up with Dr. Curt Armstrong on December 19.  As it turned out, he had started taking the mexiletine right after stopping amiodarone, and this combination is not favorable.  He is now back on mexiletine having had amiodarone cleared from his system and is  tolerating it well  He now returns to discuss the results of his event monitor that we ordered at the time of his last visit.  Recent Hospitalizations: None  Studies Personally Reviewed - (if available, images/films reviewed: From Epic Chart or Care Everywhere)  Relatively normal monitor result November-December 2018. No significant amounts of premature ventricular contractions. No ventricular tachycardia.  Interval History: Alexander Armstrong presents today actually doing very well.  He really does not have any significant symptoms and is tolerating mexiletine quite well.  The only thing he notes is that he started having some issues with sore throat and hoarseness and dry cough off and on going on for several months.  He thinks it may be something related to his medications, but is not sure which one. Otherwise no cardiac standpoint he is doing fine without any major symptoms.  He has not had any more of his irregular heartbeat spells or pass out spells.  His energy level is doing fine he is pretty much back to baseline. He denies any chest tightness or pressure with rest or exertion.  No exertional dyspnea.  Remainder of cardiac review of symptoms: No PND, orthopnea or edema.  No palpitations, lightheadedness, dizziness, weakness or syncope/near syncope. No TIA/amaurosis fugax symptoms. No claudication.  ROS: A comprehensive was performed. Review of Systems  Constitutional: Negative for malaise/fatigue (Feeling better now).  HENT: Positive for congestion and  sore throat (hoarse voice). Negative for nosebleeds.   Respiratory: Positive for cough. Negative for shortness of breath and wheezing.   Gastrointestinal: Negative for abdominal pain, blood in stool, melena, nausea and vomiting.  Genitourinary: Negative for hematuria.  Musculoskeletal: Negative for falls, joint pain and neck pain.  Neurological: Positive for dizziness (Only sometimes positional). Negative for weakness.    Psychiatric/Behavioral: Negative for memory loss. The patient is not nervous/anxious.   All other systems reviewed and are negative.  I have reviewed and (if needed) personally updated the patient's problem list, medications, allergies, past medical and surgical history, social and family history.   Past Medical History:  Diagnosis Date  . Allergy   . Arthritis   . 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 basal cell skin cancer   . CAD S/P percutaneous coronary angioplasty 11/03/2004   a. mLAD - Taxus DES 3.0 x 20; b. (11/06/04) staged PCI to RCA Taxus DES 3.5 x 16; c. (01/2017).  @ [facility] (Alexander Armstrong) Synergy DES 3.0 x 12 (for 99% lesion @ prox edge of old stent  . Cardiac syncope    No episode since starting flecainide. Had documented WIDE COMPLEX Tachycardia on loop recorder.  . Cataract bilateral cataracts  . Diverticulosis   . Esophageal stricture   . GERD (gastroesophageal reflux disease)   . Hiatal hernia 11/05/2010  . HLD (hyperlipidemia)   . 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 Unable to induce during EP study.  . Reflux gastritis   . STEMI involving left anterior descending coronary artery ([place]) 11/03/2004   [facility]: Occluded LAD treated with DES. Staged PCI to the RCA.    Past Surgical History:  Procedure Laterality Date  . CARDIAC CATHETERIZATION  ~2011   [facility]: re-look Cath - patent stents  . CORONARY STENT INTERVENTION N/A 01/27/2017   Procedure: Coronary Stent Intervention;  Surgeon: [name], MD;  Location: [place] [facility]: mLAD (just after D1) overlapping prior DES -> SYNERGY DES 3X12 drug eluting stent  . ELECTROPHYSIOLOGIC STUDY  2014   Unable to stimulate VT seen on loop recorder  . implanted loop heart monitor x2  ~2011; 2014   Per report- batter out of date; apparently captured a prolonged episode of wide complex tachycardia associated with an episode of weakness/near  syncope  . INTRAVASCULAR PRESSURE WIRE/FFR STUDY N/A 01/27/2017   Procedure: Intravascular Pressure Wire/FFR Study;  Surgeon: [name], MD;  Location: [place] [facility];  Service: Cardiovascular;  Laterality: N/A;  . LEFT HEART CATH AND CORONARY ANGIOGRAPHY N/A 01/27/2017   Procedure: Left Heart Cath and Coronary Angiography;  Surgeon: [name], MD;  Location: [facility] INVASIVE CV LAB: mLAD 99% stenosis pre-Stent & ISR --> PCI. Ost DI ~50%. pRCA DES ~50-60% FFR 0.86.  [name] NM MYOVIEW LTD  07/2014   Unremarkable EKG. No evidence of ischemia or infarct. Mild anterior attenuation, cannot exclude prior infarct, but nonreversible. EF 66%.  [name] NM MYOVIEW LTD  12/22/2016   INTERMEDIATE RISK. EF 58%. Defect 1: Medium size severe defect in the mid anterior, apical anterior and apical wall consistent with prior anterior MI. Defect to: Large defect and moderate severity in the basal inferoseptal basal inferior and inferoseptal mid inferior wall consistent with ischemia.  [name] PERCUTANEOUS CORONARY STENT INTERVENTION (PCI-S)  January-February 2006   a. mLAD - Taxus DES 3.0 x 20; b. (11/06/04) staged PCI to RCA Taxus DES 3.5 x 16; c. 01/27/2017: PCI to  mLAD prox to old  stent - Synergy DES 3.0 x 12  . UMBILICAL HERNIA REPAIR      Diagnostic Diagram                                                            Post-Intervention Diagram -  RCA FFR 0.86    LAD PCI: Synergy DES 3.0 x 12 mm overlapping proximal edge of previous stent               Current Meds  Medication Sig  . acetaminophen (TYLENOL) 500 MG tablet Take 1,000 mg by mouth 3 (three) times daily as needed for moderate pain or headache.  [name] aspirin 81 MG tablet Take 81 mg by mouth at bedtime.   . Cholecalciferol (VITAMIN D3) 1000 units CAPS Take 1,000 Units by mouth at bedtime.  . clopidogrel (PLAVIX) 75 MG tablet Take 75 mg by mouth daily.  . fexofenadine (ALLEGRA) 180 MG tablet Take 180 mg by mouth daily.   [name] ipratropium (ATROVENT) 0.03 % nasal  spray Place 2 sprays into the nose daily as needed for rhinitis.   [name] mexiletine (MEXITIL) 200 MG capsule TAKE 1 CAPSULE(200 MG) BY MOUTH TWICE DAILY  . oxybutynin (DITROPAN) 5 MG tablet Take 5 mg by mouth 2 (two) times daily.  . pantoprazole (PROTONIX) 40 MG tablet Take 40 mg by mouth daily.  . propranolol (INDERAL) 20 MG tablet Take 20 mg by mouth 2 (two) times daily.  . psyllium (REGULOID) 0.52 g capsule Take 0.52 g by mouth 2 (two) times daily.  . ranitidine (ZANTAC) 150 MG capsule Take 150 mg by mouth daily as needed for heartburn.  . rosuvastatin (CRESTOR) 20 MG tablet Take 20 mg by mouth daily.  . tamsulosin (FLOMAX) 0.4 MG CAPS capsule Take 0.4 mg by mouth daily after supper.     Allergies  Allergen Reactions  . Adhesive [Tape] Rash  . Hydrocodone Rash    Social History   Socioeconomic History  . Marital status: Married    Spouse name: None  . Number of children: 1  . Years of education: None  . Highest education level: None  Social Needs  . Financial resource strain: None  . Food insecurity - worry: None  . Food insecurity - inability: None  . Transportation needs - medical: None  . Transportation needs - non-medical: None  Occupational History  . Occupation: Land: Retired  Tobacco Use  . Smoking status: Never Smoker  . Smokeless tobacco: Never Used  Substance and Sexual Activity  . Alcohol use: No  . Drug use: No  . Sexual activity: None  Other Topics Concern  . None  Social History Narrative  . None    family history includes Cancer in his father; Glaucoma in his paternal aunt; Heart attack in his brother; Heart disease in his mother; Macular degeneration in his father; Multiple myeloma in his mother; Prostate cancer in his father; Uterine cancer in his mother.  Wt Readings from Last 3 Encounters:  09/23/17 207 lb (93.9 kg)  09/22/17 207 lb (93.9 kg)  06/24/17 206 lb (93.4 kg)    PHYSICAL EXAM BP 120/68   Pulse 80   Ht 5' 11"  (1.803  m)   Wt 207 lb (93.9 kg)   BMI  28.87 kg/m  Physical Exam  Constitutional: He is oriented to person, place, and time. He appears well-developed and well-nourished. No distress.  Well-groomed. Appears younger than stated age  HENT:  Head: Normocephalic and atraumatic.  Mouth/Throat: No oropharyngeal exudate.  Neck: Normal range of motion. Neck supple. No hepatojugular reflux and no JVD present. Carotid bruit is not present.  Cardiovascular: Normal rate, regular rhythm, normal heart sounds and intact distal pulses.  No extrasystoles are present. PMI is not displaced. Exam reveals no gallop and no friction rub.  No murmur heard. Pulmonary/Chest: Effort normal and breath sounds normal. No respiratory distress. He has no wheezes. He has no rales.  Abdominal: Soft. Bowel sounds are normal. He exhibits no distension. There is no tenderness.  Musculoskeletal: Normal range of motion. He exhibits no edema.  Neurological: He is alert and oriented to person, place, and time.  Skin: Skin is warm and dry. No erythema.  Psychiatric: He has a normal mood and affect. His behavior is normal. Judgment and thought content normal.  Nursing note and vitals reviewed.    Adult ECG Report None  Other studies Reviewed: Additional studies/ records that were reviewed today include:  Recent Labs:   No results found for: CHOL, HDL, LDLCALC, LDLDIRECT, TRIG, CHOLHDL- PCP checks  Long discussion about his symptoms and different treatment options as well as plan going forward. 45 minutes spent with the patient. Greater than 50% time spent in direct consultation.   ASSESSMENT / PLAN: Problem List Items Addressed This Visit    Atherosclerosis of native coronary artery without angina pectoris - Primary (Chronic)    He really does not have true angina symptoms, it is mostly atypical symptoms such as fatigue or just overall lack of energy.  Last stress test earlier this year showed significant LAD ischemia and he had a  severe LAD lesion.  Despite that he did not have any symptoms classic angina symptoms.  He now is overlapping stents in the LAD is stented in the RCA with moderate in-stent restenosis remains on aspirin and Plavix.  I think once 1 years out, we can probably have him stop the aspirin. He is on Inderal which has been a long-term beta-blocker for him.  He is also on Crestor. With preserved EF he is not on an ACE inhibitor or ARB.      Cardiac syncope (Chronic)    Thankfully, no further syncopal spells.  He is now back on mexiletine. I think the plan for now is to avoid a loop recorder unless symptoms recur on mexiletine.      Hyperlipidemia LDL goal <70 (Chronic)    Labs are followed by his PCP.  He is on Crestor and seems to be tolerating it well.      Palpitations   Presence of drug coated stent in LAD coronary artery (Chronic)    Overlapping DES stents in the LAD.  For now we will continue lifelong Plavix but can stop aspirin after the one-year mark.  After 1 year out from this most recent PCI (April 2018), it would be okay for him to hold Plavix for procedures if necessary.      Wide-complex tachycardia (HCC) (Chronic)    No longer on flecainide or amiodarone.  He did not tolerate either.  Seems to be tolerating mexiletine relatively well now that he is taking it without having been on amiodarone.  He is on a stable dose of beta-blocker albeit on an usual cardiac beta blocker (propranolol).  As per  my plan, he had held his mexiletine to allow for amiodarone to clear from his system.  He now has completed wearing an event monitor on mexiletine alone and has not shown any significant arrhythmias.  He also showed no significant bradycardia which was a concern.         Current medicines are reviewed at length with the patient today. (+/- concerns) Concern for side effect from mexiletine The following changes have been made: See below  Patient Instructions  No changes With  medications     Your physician wants you to follow-up in 6 month with DR Arline Ketter. You will receive a reminder letter in the mail two months in advance. If you don't receive a letter, please call our office to schedule the follow-up appointment.    If you need a refill on your cardiac medications before your next appointment, please call your pharmacy.    Studies Ordered:   No orders of the defined types were placed in this encounter.     [name], M.D., M.S. Interventional Cardiologist   Pager # [phone] Phone # [phone] [address] [place], [place] [address]

## 2017-09-23 NOTE — Patient Instructions (Signed)
No changes With medications     Your physician wants you to follow-up in 6 month with DR HARDING. You will receive a reminder letter in the mail two months in advance. If you don't receive a letter, please call our office to schedule the follow-up appointment.    If you need a refill on your cardiac medications before your next appointment, please call your pharmacy.

## 2017-09-26 ENCOUNTER — Encounter: Payer: Self-pay | Admitting: Cardiology

## 2017-09-26 NOTE — Assessment & Plan Note (Addendum)
Overlapping DES stents in the LAD.  For now we will continue lifelong Plavix but can stop aspirin after the one-year mark.  After 1 year out from this most recent PCI (April 2018), it would be okay for him to hold Plavix for procedures if necessary.

## 2017-09-26 NOTE — Assessment & Plan Note (Signed)
No longer on flecainide or amiodarone.  He did not tolerate either.  Seems to be tolerating mexiletine relatively well now that he is taking it without having been on amiodarone.  He is on a stable dose of beta-blocker albeit on an usual cardiac beta blocker (propranolol).  As per my plan, he had held his mexiletine to allow for amiodarone to clear from his system.  He now has completed wearing an event monitor on mexiletine alone and has not shown any significant arrhythmias.  He also showed no significant bradycardia which was a concern.

## 2017-09-26 NOTE — Assessment & Plan Note (Addendum)
He really does not have true angina symptoms, it is mostly atypical symptoms such as fatigue or just overall lack of energy.  Last stress test earlier this year showed significant LAD ischemia and he had a severe LAD lesion.  Despite that he did not have any symptoms classic angina symptoms.  He now is overlapping stents in the LAD is stented in the RCA with moderate in-stent restenosis remains on aspirin and Plavix.  I think once 1 years out, we can probably have him stop the aspirin. He is on Inderal which has been a long-term beta-blocker for him.  He is also on Crestor. With preserved EF he is not on an ACE inhibitor or ARB.

## 2017-09-26 NOTE — Assessment & Plan Note (Signed)
Thankfully, no further syncopal spells.  He is now back on mexiletine. I think the plan for now is to avoid a loop recorder unless symptoms recur on mexiletine.

## 2017-09-26 NOTE — Assessment & Plan Note (Signed)
Labs are followed by his PCP.  He is on Crestor and seems to be tolerating it well.

## 2017-10-08 DIAGNOSIS — H02834 Dermatochalasis of left upper eyelid: Secondary | ICD-10-CM

## 2017-10-08 DIAGNOSIS — Z83511 Family history of glaucoma: Secondary | ICD-10-CM | POA: Insufficient documentation

## 2017-10-08 DIAGNOSIS — H16223 Keratoconjunctivitis sicca, not specified as Sjogren's, bilateral: Secondary | ICD-10-CM | POA: Insufficient documentation

## 2017-10-08 DIAGNOSIS — H02831 Dermatochalasis of right upper eyelid: Secondary | ICD-10-CM | POA: Insufficient documentation

## 2017-11-16 ENCOUNTER — Telehealth: Payer: Self-pay | Admitting: Cardiology

## 2017-11-16 ENCOUNTER — Telehealth: Payer: Self-pay

## 2017-11-16 NOTE — Telephone Encounter (Signed)
Per pt call needs a call back about coming off his Plavix for awhile.

## 2017-11-16 NOTE — Telephone Encounter (Signed)
Per the pts request I have done a tier exception on Mexiletine through covermymeds.

## 2017-11-16 NOTE — Telephone Encounter (Signed)
Returned call to patient, states he is anticipating having to have a dental procedure including extractions and needs instructions for his Plavix.   Advised that once he sees dentist, they will fax our office the information and this will be reviewed and instructions will be given at that time.  Patient states he has not seen dentist yet but will be soon and will request they fax a request to our office.

## 2017-11-17 NOTE — Telephone Encounter (Addendum)
I attempted to do a tier exception through covermymeds but was unable as only a PA was allowed. I called [employer] and did a tier exception over the phone with Brianna. Per [place] Ar we will receive a fax and the pt will receive a letter once they have reached a decision.  PA Reference # [id number]

## 2017-11-18 ENCOUNTER — Telehealth: Payer: Self-pay | Admitting: *Deleted

## 2017-11-18 NOTE — Telephone Encounter (Signed)
   [facility] Pre-operative Risk Assessment    Request for surgical clearance:  1. What type of surgery is being performed? 8 EXTRACTIONS FOR DENTURES, 5 SURGICAL EXTRACTIONS   2. When is this surgery scheduled? ASAP    3. What type of clearance is required (medical clearance vs. Pharmacy clearance to hold med vs. Both)? BOTH  4. Are there any medications that need to be held prior to surgery and how long?PLAVIX-ASKING FOR DIRECTION   5. Practice name and name of physician performing surgery? [facility]   6. What is your office phone and fax number? PH=[phone]  FAX=[phone]   7. Anesthesia type (None, local, MAC, general) ? UNKNOWN   Alexander Armstrong 11/18/2017, 2:47 PM  _________________________________________________________________   (provider comments below)

## 2017-11-18 NOTE — Telephone Encounter (Signed)
   Primary Cardiologist: [name], MD  Chart reviewed as part of pre-operative protocol coverage. Per note during office visit with Dr. [name] 09/23/17 " Ionce 1 years out, we can probably have him stop the aspirin". Had stenting to mLAD 01/27/2017.  Dr. [name] please advised regarding clearance. Please forward your response to P [facility] PREOP.  Left voice mail to call back between pre-op hours.    [place], [place] 11/18/2017, 3:56 PM

## 2017-11-18 NOTE — Telephone Encounter (Signed)
So - with DES PCI in April of 2018 - would prefer 1 yr of Plavix without stopping.   However, 10 months out - probably OK to hold x 5 days (if possible stay on ASA). - minimal risk with new generation DES.  [name], MD

## 2017-11-18 NOTE — Telephone Encounter (Signed)
I received a fax from [employer] regarding MEXILETINE, they are requesting further clinical information, I have completed form along with MD notes and faxed back to [employer].

## 2017-11-19 NOTE — Telephone Encounter (Signed)
   Primary Cardiologist: [name], MD  Chart reviewed as part of pre-operative protocol coverage.  Alexander Armstrong is cleared to hold the Plavix for 5 days for the procedure.   However, Dr. [name] prefers that he not stop the aspirin.  I will route this recommendation to the requesting party via Epic fax function and remove from pre-op pool.  Please call with questions.  Rosaria Ferries, PA-C 11/19/2017, 3:27 PM

## 2017-11-22 ENCOUNTER — Telehealth: Payer: Self-pay | Admitting: Cardiology

## 2017-11-22 NOTE — Telephone Encounter (Signed)
Returned pts call and let him know that per conversation in preop pool,  Chart reviewed as part of pre-operative protocol coverage.  Alexander Armstrong is cleared to hold the Plavix for 5 days for the procedure.   However, Dr. [name] prefers that he not stop the aspirin  Pt thanked me for returning his call and clarifying this for him.

## 2017-11-22 NOTE — Telephone Encounter (Signed)
New Message   Patient is calling back in reference to his pre-op clearance. He did receive a call on 11/18/2017    Request for surgical clearance:  1. What type of surgery is being performed? 8 EXTRACTIONS FOR DENTURES, 5 SURGICAL EXTRACTIONS   2. When is this surgery scheduled? ASAP    3. What type of clearance is required (medical clearance vs. Pharmacy clearance to hold med vs. Both)? BOTH  4. Are there any medications that need to be held prior to surgery and how long?PLAVIX-ASKING FOR DIRECTION   5. Practice name and name of physician performing surgery? [facility]   6. What is your office phone and fax number? PH=[phone]  FAX=[phone]   7. Anesthesia type (None, local, MAC, general) ? UNKNOWN   Fredia Beets 11/18/2017, 2:47 PM  _________________________________________________________________

## 2017-11-23 NOTE — Telephone Encounter (Addendum)
Denial received via fax from [employer] on the Mexiletine tier exception. Reason:  The pt needs to first try all other covered medications used to treat his condition in tier 2 which include Sotalol, Quinidine, or Propafenone.  Fax states that they have notified the pt of denial.  Will forward to Dr Curt Bears and his nurse as Juluis Rainier.

## 2017-11-25 ENCOUNTER — Encounter: Payer: Self-pay | Admitting: Cardiology

## 2017-11-25 NOTE — Telephone Encounter (Signed)
This encounter was created in error - please disregard.

## 2017-11-25 NOTE — Telephone Encounter (Signed)
See addendum from last clinic not for further discussion.

## 2018-01-03 ENCOUNTER — Encounter: Payer: Self-pay | Admitting: Cardiology

## 2018-01-03 ENCOUNTER — Ambulatory Visit (INDEPENDENT_AMBULATORY_CARE_PROVIDER_SITE_OTHER): Payer: Medicare Other | Admitting: Cardiology

## 2018-01-03 VITALS — BP 110/70 | HR 48 | Ht 71.0 in | Wt 196.0 lb

## 2018-01-03 DIAGNOSIS — I472 Ventricular tachycardia, unspecified: Secondary | ICD-10-CM

## 2018-01-03 DIAGNOSIS — E785 Hyperlipidemia, unspecified: Secondary | ICD-10-CM

## 2018-01-03 DIAGNOSIS — I25118 Atherosclerotic heart disease of native coronary artery with other forms of angina pectoris: Secondary | ICD-10-CM

## 2018-01-03 NOTE — Patient Instructions (Addendum)
Medication Instructions:  Your physician has recommended you make the following change in your medication:  1. STOP Propranolol  Labwork: None ordered  Testing/Procedures: None ordered  Follow-Up: Your physician recommends that you schedule a follow-up appointment in: 3 months with Dr. Curt Bears.   * If you need a refill on your cardiac medications before your next appointment, please call your pharmacy.   *Please note that any paperwork needing to be filled out by the provider will need to be addressed at the front desk prior to seeing the provider. Please note that any FMLA, disability or other documents regarding health condition is subject to a $25.00 charge that must be received prior to completion of paperwork in the form of a money order or check.  Thank you for choosing [facility]!!   Trinidad Curet, RN [phone]

## 2018-01-03 NOTE — Progress Notes (Signed)
Electrophysiology Office Note   Date:  01/03/2018   ID:  Alexander Armstrong, DOB 1940/12/08, MRN [id number]  PCP:  [name], MD  Cardiologist:  [name] Primary Electrophysiologist:  Saleha Kalp [name], MD    Chief Complaint  Patient presents with  . Follow-up    Wide-Complex Tachycardia     History of Present Illness: Alexander Armstrong is a 77 y.o. male who presents today for electrophysiology evaluation.   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 During his evaluation for syncope, a Linq was inserted and a wide complex tachycardia was seen. He was started on flecainide 50 mg a day. EP study in 2014 showed a normal HV interval with no inducible arrhythmias at baseline or with 28 g/m of Isuprel. A nonsustained long RP atrial tachycardia was induced. Prior to starting flecainide, he complained of intermittent episodes of palpitations dizziness and lightheadedness. At times she also was presyncopal. The cycle length of the wide complex tachycardia was 220 msec. Flecainide has been stopped due to his coronary artery disease. He was started on amiodarone but did not tolerate this. Was started on Mexetiline.  Today, denies symptoms of palpitations, chest pain, shortness of breath, orthopnea, PND, lower extremity edema, claudication, dizziness, presyncope, syncope, bleeding, or neurologic sequela. The patient is tolerating medications without difficulties.  He has been having episodes of fatigue.  They happen quite suddenly.  He had 2 episodes over the weekend.  He said that he sat down and just did not feel like getting up.  These episodes are similar to his wide-complex tachycardia episode.     Past Medical History:  Diagnosis Date  . Allergy   . Arthritis   . 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 basal cell  skin cancer   . CAD S/P percutaneous coronary angioplasty 11/03/2004   a. mLAD - Taxus DES 3.0 x 20; b. (11/06/04) staged PCI to RCA Taxus DES 3.5 x 16; c. (01/2017).  @ [facility] (Dr. [name]) Synergy DES 3.0 x 12 (for 99% lesion @ prox edge of old stent  . Cardiac syncope    No episode since starting flecainide. Had documented WIDE COMPLEX Tachycardia on loop recorder.  . Cataract bilateral cataracts  . Diverticulosis   . Esophageal stricture   . GERD (gastroesophageal reflux disease)   . Hiatal hernia 11/05/2010  . HLD (hyperlipidemia)   . Nonsustained paroxysmal ventricular tachycardia ([place]) 2014   Presumably controlled with flecainide. Unable to induce during EP study.  . Reflux gastritis   . STEMI involving left anterior descending coronary artery ([place]) 11/03/2004   [facility]: Occluded LAD treated with DES. Staged PCI to the RCA.   Past Surgical History:  Procedure Laterality Date  . CARDIAC CATHETERIZATION  ~2011   [facility]: re-look Cath - patent stents  . CORONARY STENT INTERVENTION N/A 01/27/2017   Procedure: Coronary Stent Intervention;  Surgeon: [name], MD;  Location: [facility]: mLAD (just after D1) overlapping prior DES -> SYNERGY DES 3X12 drug eluting stent  . ELECTROPHYSIOLOGIC STUDY  2014   Unable to stimulate VT seen on loop recorder  . implanted loop heart monitor x2  ~2011; 2014   Per report- batter out of date; apparently captured a prolonged episode of wide complex tachycardia associated with an episode of weakness/near syncope  .  INTRAVASCULAR PRESSURE WIRE/FFR STUDY N/A 01/27/2017   Procedure: Intravascular Pressure Wire/FFR Study;  Surgeon: [name], MD;  Location: [place];  Service: Cardiovascular;  Laterality: N/A;  . LEFT HEART CATH AND CORONARY ANGIOGRAPHY N/A 01/27/2017   Procedure: Left Heart Cath and Coronary Angiography;  Surgeon: [name], MD;  Location: [facility] INVASIVE CV LAB: mLAD 99% stenosis pre-Stent & ISR -->  PCI. Ost DI ~50%. pRCA DES ~50-60% FFR 0.86.  [name] NM MYOVIEW LTD  07/2014   Unremarkable EKG. No evidence of ischemia or infarct. Mild anterior attenuation, cannot exclude prior infarct, but nonreversible. EF 66%.  [name] NM MYOVIEW LTD  12/22/2016   INTERMEDIATE RISK. EF 58%. Defect 1: Medium size severe defect in the mid anterior, apical anterior and apical wall consistent with prior anterior MI. Defect to: Large defect and moderate severity in the basal inferoseptal basal inferior and inferoseptal mid inferior wall consistent with ischemia.  [name] PERCUTANEOUS CORONARY STENT INTERVENTION (PCI-S)  January-February 2006   a. mLAD - Taxus DES 3.0 x 20; b. (11/06/04) staged PCI to RCA Taxus DES 3.5 x 16; c. 01/27/2017: PCI to mLAD prox to old  stent - Synergy DES 3.0 x 12  . UMBILICAL HERNIA REPAIR       Current Outpatient Medications  Medication Sig Dispense Refill  . acetaminophen (TYLENOL) 500 MG tablet Take 1,000 mg by mouth 3 (three) times daily as needed for moderate pain or headache.    [name] aspirin 81 MG tablet Take 81 mg by mouth at bedtime.     . Cholecalciferol (VITAMIN D3) 1000 units CAPS Take 1,000 Units by mouth at bedtime.    . clopidogrel (PLAVIX) 75 MG tablet Take 75 mg by mouth daily.    . fexofenadine (ALLEGRA) 180 MG tablet Take 180 mg by mouth daily.     [name] ipratropium (ATROVENT) 0.03 % nasal spray Place 2 sprays into the nose daily as needed for rhinitis.     [name] mexiletine (MEXITIL) 200 MG capsule TAKE 1 CAPSULE(200 MG) BY MOUTH TWICE DAILY 180 capsule 2  . pantoprazole (PROTONIX) 40 MG tablet Take 40 mg by mouth daily.    . propranolol (INDERAL) 20 MG tablet Take 20 mg by mouth 2 (two) times daily.    . psyllium (REGULOID) 0.52 g capsule Take 0.52 g by mouth 2 (two) times daily.    . ranitidine (ZANTAC) 150 MG capsule Take 150 mg by mouth daily as needed for heartburn.    . rosuvastatin (CRESTOR) 20 MG tablet Take 20 mg by mouth daily.    . tamsulosin (FLOMAX) 0.4 MG CAPS capsule Take 0.4  mg by mouth daily after supper.      No current facility-administered medications for this visit.     Allergies:   Adhesive [tape] and Hydrocodone   Social History:  The patient  reports that he has never smoked. He has never used smokeless tobacco. He reports that he does not drink alcohol or use drugs.   Family History:  The patient's family history includes Cancer in his father; Glaucoma in his paternal aunt; Heart attack in his brother; Heart disease in his mother; Macular degeneration in his father; Multiple myeloma in his mother; Prostate cancer in his father; Uterine cancer in his mother.    ROS:  Please see the history of present illness.   Otherwise, review of systems is positive for fatigue, dizziness.   All other systems are reviewed and negative.   PHYSICAL EXAM: VS:  BP  110/70   Pulse (!) 48   Ht 5' 11"  (1.803 m)   Wt 196 lb (88.9 kg)   BMI 27.34 kg/m  , BMI Body mass index is 27.34 kg/m. GEN: Well nourished, well developed, in no acute distress  HEENT: normal  Neck: no JVD, carotid bruits, or masses Cardiac: RRR; no murmurs, rubs, or gallops,no edema  Respiratory:  clear to auscultation bilaterally, normal work of breathing GI: soft, nontender, nondistended, + BS MS: no deformity or atrophy  Skin: warm and dry Neuro:  Strength and sensation are intact Psych: euthymic mood, full affect  EKG:  EKG is ordered today. Personal review of the ekg ordered shows rhythm, first-degree AV block, rate 48  Recent Labs: 01/21/2017: Hemoglobin 13.1; Platelets 205 01/27/2017: BUN 18; Creatinine, Ser 1.06; Potassium 4.0; Sodium 137    Lipid Panel  No results found for: CHOL, TRIG, HDL, CHOLHDL, VLDL, LDLCALC, LDLDIRECT   Wt Readings from Last 3 Encounters:  01/03/18 196 lb (88.9 kg)  09/23/17 207 lb (93.9 kg)  09/22/17 207 lb (93.9 kg)      Other studies Reviewed: Additional studies/ records that were reviewed today include: SPECT 12/22/16  Review of the above records  today demonstrates:   The left ventricular ejection fraction is normal (55-65%).  Nuclear stress EF: 58%.  Defect 1: There is a medium defect of severe severity present in the mid anterior, apical anterior and apex location consistent with a previous anterior apical MI  Defect 2: There is a large defect of moderate severity present in the basal inferoseptal, basal inferior, mid inferoseptal, mid inferior and apical inferior location that is c/w inferior ischemia.  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  This is an intermediate risk study.   01/27/17 LHC  Mid LAD-1 lesion, 99 %stenosed - proximal to & involving the proximal portion of the previous stent.  A STENT SYNERGY DES 3X12 drug eluting stent was successfully placed, and overlaps previously placed stent.  Post intervention, there is a 0% residual stenosis.  _____________  Prox RCA-1 lesion, 55 %stenosed - In-stent Re-stenosis: FFR 0.86  The left ventricular ejection fraction is 50-55% by visual estimate.  Post intervention, there is a 0% residual stenosis.   Severe single-vessel disease severe stenosis in the LAD involving the proximal portion of the petrous the placed stent. This was treated with a single DES stent after angioplasty.  FFR negative lesion in the RCA.  I suspect that in the setting of the severe LAD lesion, this lesion was physiologically significant on the Myoview stress test. No longer significant following PCI of LAD.  ASSESSMENT AND PLAN:  1.  Coronary artery disease status post RCA and LAD interventions with stable angina: Current chest pain.  Continuing antiplatelets.  No changes.  2. Wide complex tachycardia: Has been put on mexiletine.  He is tolerating this medication without major complaint.  He does have a few episodes of severe fatigue.  He is on propranolol and his heart rate is low.  We Artisha Capri thus plan to stop his atenolol as it could be that  he is becoming more bradycardic.  To his resting bradycardia, I do not feel like sotalol is on optimal medication for him.  3. Hyperlipidemia: Continue Crestor  Current medicines are reviewed at length with the patient today.   The patient does not have concerns regarding his medicines.  The following changes were made today: Stop propranolol  Labs/ tests ordered today include:  Orders Placed This  Encounter  Procedures  . EKG 12-Lead     Disposition:   FU with Isabellamarie Randa 3  months  Signed, Latria Mccarron [name], MD  01/03/2018 11:59 AM     [facility] [address] [place] [address] [phone] (office) [phone] (fax)

## 2018-01-03 NOTE — Telephone Encounter (Signed)
**Note De-Identified Alexander Armstrong Obfuscation** LMTCB. I need the pts insurance info prior to attempting this tier exception.

## 2018-01-03 NOTE — Telephone Encounter (Signed)
Pt seen today in clinic. He still would like [place] to attempt a Tier 2 exception to make Mexiletine more affordable. Will forward to prior auth to re-address.   Documentation as to why he can't take other mediations, recommended by insurance, are documented in 09/19/17 OV note and today's OV note.

## 2018-01-06 ENCOUNTER — Telehealth: Payer: Self-pay | Admitting: Cardiology

## 2018-01-06 NOTE — Telephone Encounter (Signed)
This call came to my VM. Pt was told to d/c Propanalol for now due to low heart rate/dizziness. Yesterday his was more dizzy then ever and his pulse rate was 111. Sitting his pulse rate was about 85, with not much movement it went between 95-110. He hasn't taken the Propanalol since Monday am, pls advise

## 2018-01-06 NOTE — Telephone Encounter (Signed)
Pt reports being dizzy when he "moves a bunch".  States that his HRs had been running about 55 (before stopping propranolol) and now they are running b/t 80-110.  Pt has been on Propranolol for 12-15 years per his statement.  Advised pt that dizziness may be a rebound effect from abruptly stopping BB.  Advised to take 1/2 usual dose for the next week and then stop medication completely. Pt is agreeable to this plan. Pt will continue to monitor his HRs.  He will call back if they remain elevated (>100) and/or symptomatic. He is appreciative of the call.

## 2018-01-18 NOTE — Telephone Encounter (Signed)
**Note De-identified Alexander Armstrong Obfuscation** LMTCB

## 2018-02-08 ENCOUNTER — Ambulatory Visit: Payer: Medicare Other | Admitting: Internal Medicine

## 2018-02-08 NOTE — Telephone Encounter (Signed)
**Note De-Identified Alexander Armstrong Obfuscation** I have done a tier exception through covermymeds on the pts Mexiletine (2nd one) in hopes of getting the cost lowered.  I have discussed with the pt and made him aware that I have done another tier exception on his Mexiletine.  Awaiting response.

## 2018-02-08 NOTE — Telephone Encounter (Signed)
**Note De-Identified Wilberto Console Obfuscation** Denial on Mexiletine PA received Myasia Sinatra fax from [place]. I did not do a PA-I did a tier exception on the pts Mexiletine.  I called [employer] and did a Mexiletine tier exception over the phone with [facility]. Per Tye Maryland we will get a fax with the determination in a few days.

## 2018-02-09 NOTE — Telephone Encounter (Signed)
**Note De-Identified Custer Pimenta Obfuscation** Form received Jinny Sweetland fax from [employer] to complete for the pts Mexiletine tier exception. I have completed the form and faxed it back to [employer].

## 2018-02-09 NOTE — Telephone Encounter (Signed)
Approval on the pts Mexiletine tier exception received from [employer] via fax and is now at a tier 1 copay. Approval good until 10/04/2018. Reference# [id number]  I have notified the pts pharmacy of this approval.

## 2018-02-22 ENCOUNTER — Ambulatory Visit (INDEPENDENT_AMBULATORY_CARE_PROVIDER_SITE_OTHER): Payer: Medicare Other | Admitting: Cardiology

## 2018-02-22 ENCOUNTER — Encounter: Payer: Self-pay | Admitting: Cardiology

## 2018-02-22 VITALS — BP 92/60 | HR 97 | Ht 71.0 in | Wt 192.0 lb

## 2018-02-22 DIAGNOSIS — I2510 Atherosclerotic heart disease of native coronary artery without angina pectoris: Secondary | ICD-10-CM

## 2018-02-22 DIAGNOSIS — Z955 Presence of coronary angioplasty implant and graft: Secondary | ICD-10-CM | POA: Diagnosis not present

## 2018-02-22 DIAGNOSIS — I472 Ventricular tachycardia: Secondary | ICD-10-CM | POA: Diagnosis not present

## 2018-02-22 DIAGNOSIS — E785 Hyperlipidemia, unspecified: Secondary | ICD-10-CM

## 2018-02-22 DIAGNOSIS — I951 Orthostatic hypotension: Secondary | ICD-10-CM

## 2018-02-22 DIAGNOSIS — R002 Palpitations: Secondary | ICD-10-CM | POA: Diagnosis not present

## 2018-02-22 DIAGNOSIS — R000 Tachycardia, unspecified: Secondary | ICD-10-CM

## 2018-02-22 DIAGNOSIS — R55 Syncope and collapse: Secondary | ICD-10-CM

## 2018-02-22 DIAGNOSIS — I959 Hypotension, unspecified: Secondary | ICD-10-CM | POA: Insufficient documentation

## 2018-02-22 MED ORDER — MIDODRINE HCL 2.5 MG PO TABS: ORAL_TABLET | ORAL | 6 refills | Status: DC

## 2018-02-22 NOTE — Assessment & Plan Note (Signed)
Pretty well controlled with wide-complex tachycardia history on mexiletine.  No longer on beta-blocker.

## 2018-02-22 NOTE — Assessment & Plan Note (Signed)
No anginal symptoms.  Still has the fatigue and lack of energy which is been persistent since before I did a stent.  He did not really get that much better following PCI, mostly because he did not have much symptoms before.  Plan for now will be to allow for mild liberalization of salt intake and hydrate hydration to increase blood pressure.  He is not on any antihypertensives because of hypotension (i.e. not on beta-blocker or ARB/ACE inhibitor).  On aspirin, Plavix and statin.

## 2018-02-22 NOTE — Assessment & Plan Note (Signed)
Overlapping DES in the LAD.  Now on lifelong Plavix.  Would be okay to stop aspirin, but he seems to be tolerating it well. Okay to hold Plavix for procedures (5 to 7 days) now as he is greater than 1 year out.

## 2018-02-22 NOTE — Assessment & Plan Note (Signed)
Near syncopal spell with some chest discomfort this weekend.  He is not sure if it was an arrhythmia, He does not think that the chest pain was angina. Relatively short-lived but just felt poorly afterwards.  Continue to monitor but would continue mexiletine per EP.

## 2018-02-22 NOTE — Progress Notes (Signed)
PCP: [name], MD  Clinic Note: Chief Complaint  Patient presents with  . Follow-up    One episode of chest pain, dizziness  . Coronary Artery Disease    HPI: Remi Lopata is a 77 y.o. male with a PMH below who presents today for 82-monthfollow-up For CAD-PCI as well as a history of wide complex tachycardia. CJruwas a long-term patient of Dr. TElonda Huskybased out of [employer]    I first saw CJaksonas the patient in March 2018 (his wife BHorris Latinois a patient of mine) to establish cardiology care. We actually did a stress test for routine evaluation that was positive and he ended up undergoing cardiac catheterization with PCI to the LAD.  Despite having the abnormal stress test and PCI, CEdrasnever really noted having any true anginal symptoms just fatigue.  He did note improved energy after his PCI.  I have referred over to Dr. CCurt Bearsfrom elective physiology  CAD Hx:   Anterior STEMI status post PCI to the LAD followed by staged PCI to the RCA in 2006.  --> mLAD - Taxus DES 3.0 x 20; b. (11/06/04) staged PCI to RCA Taxus DES 3.5 x 16  WCT noted on ILR --> started on Flecainide (obviously stopped in 2018 following LAD PCI --> now on Mexiletine ([name]  Myoview - 12/2016  INTERMEDIATE RISK. EF 58%. Defect 1: Medium size severe defect in the mid anterior, apical anterior and apical wall consistent with prior anterior MI. Defect to: Large defect and moderate severity in the basal inferoseptal basal inferior and inferoseptal mid inferior wall consistent with ischemia.  (01/2017).  @ [facility] (Dr. HEllyn Hack CATH-PCI LAD: Synergy DES 3.0 x 12 (for 99% lesion @ prox edge of old stent  Recent Hospitalizations: none  Studies Personally Reviewed - (if available, images/films reviewed: From Epic Chart or Care Everywhere)  None  Interval History:  CMellon Financialtoday mostly noticing that he feels a little bit more fatigued and dizzy that he usually has.  He has not had any resting  or exertional chest tightness or pressure, but does have some exercise intolerance with exercise induced dyspnea.  No chest pain.  This Sunday however he had an episode where he felt like he would almost pass out.  He was stumbling and staggering and had prolonged chest discomfort which felt like heartburn.  He did not go to church that day, and felt pretty poorly for most the day, but is now recovered and has not had any further symptoms.  The episode was not related to exertion.  He felt lightheaded and dizzy as though he would pass out, but did not pass out he has not had true syncope.  Other than that one episode, he has noticed that he has been quite dizzy, most notably orthostatic especially in the morning.  Remainder of cardiac review of symptoms: Cardiovascular ROS: positive for - 1 episode of atypical chest pain, lightheadedness/dizziness. negative for - dyspnea on exertion, edema, irregular heartbeat, orthopnea, palpitations, paroxysmal nocturnal dyspnea, rapid heart rate, shortness of breath or syncope/near syncope, TIA/amaurosis fugax symptoms, claudication    ROS: A comprehensive was performed. Review of Systems  Constitutional: Positive for malaise/fatigue (Not really malaise, just exercise intolerance).  HENT: Negative for congestion, nosebleeds and sore throat (hoarse voice).   Respiratory: Negative for cough, shortness of breath (Only with exertion beyond routine activity) and wheezing.   Gastrointestinal: Negative for abdominal pain, blood in stool, melena, nausea and vomiting.  Genitourinary: Negative for hematuria.  Musculoskeletal: Negative for falls, joint pain and neck pain.  Neurological: Positive for dizziness (Only sometimes positional). Negative for weakness.       Near syncope this weekend x 1 - otherwise none.  Psychiatric/Behavioral: Negative for memory loss. The patient is not nervous/anxious.   All other systems reviewed and are negative.  I have reviewed and  (if needed) personally updated the patient's problem list, medications, allergies, past medical and surgical history, social and family history.   Past Medical History:  Diagnosis Date  . Allergy   . Arthritis   . 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 basal cell skin cancer   . CAD S/P percutaneous coronary angioplasty 11/03/2004   a. mLAD - Taxus DES 3.0 x 20; b. (11/06/04) staged PCI to RCA Taxus DES 3.5 x 16; c. (01/2017).  @ [facility] (Dr. [name]) Synergy DES 3.0 x 12 (for 99% lesion @ prox edge of old stent  . Cardiac syncope    No episode since starting flecainide. Had documented WIDE COMPLEX Tachycardia on loop recorder.  . Cataract bilateral cataracts  . Diverticulosis   . Esophageal stricture   . GERD (gastroesophageal reflux disease)   . Hiatal hernia 11/05/2010  . HLD (hyperlipidemia)   . 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 Unable to induce during EP study.  . Reflux gastritis   . STEMI involving left anterior descending coronary artery ([place]) 11/03/2004   [facility]: Occluded LAD treated with DES. Staged PCI to the RCA.    Past Surgical History:  Procedure Laterality Date  . CARDIAC CATHETERIZATION  ~2011   [facility]: re-look Cath - patent stents  . CORONARY STENT INTERVENTION N/A 01/27/2017   Procedure: Coronary Stent Intervention;  Surgeon: [name], MD;  Location: [facility]: mLAD (just after D1) overlapping prior DES -> SYNERGY DES 3X12 drug eluting stent  . ELECTROPHYSIOLOGIC STUDY  2014   Unable to stimulate VT seen on loop recorder  . implanted loop heart monitor x2  ~2011; 2014   Per report- batter out of date; apparently captured a prolonged episode of wide complex tachycardia associated with an episode of weakness/near syncope  . INTRAVASCULAR PRESSURE WIRE/FFR STUDY N/A 01/27/2017   Procedure: Intravascular Pressure Wire/FFR Study;  Surgeon: [name], MD;  Location: [facility];  Service: Cardiovascular;  Laterality: N/A;  . LEFT HEART CATH AND CORONARY ANGIOGRAPHY N/A 01/27/2017   Procedure: Left Heart Cath and Coronary Angiography;  Surgeon: [name], MD;  Location: [facility] INVASIVE [facility] LAB: mLAD 99% stenosis pre-Stent & ISR --> PCI. Ost DI ~50%. pRCA DES ~50-60% FFR 0.86.  [name] NM MYOVIEW LTD  07/2014   Unremarkable EKG. No evidence of ischemia or infarct. Mild anterior attenuation, cannot exclude prior infarct, but nonreversible. EF 66%.  [name] NM MYOVIEW LTD  12/22/2016   INTERMEDIATE RISK. EF 58%. Defect 1: Medium size severe defect in the mid anterior, apical anterior and apical wall consistent with prior anterior MI. Defect to: Large defect and moderate severity in the basal inferoseptal basal inferior and inferoseptal mid inferior wall consistent with ischemia.  [name] PERCUTANEOUS CORONARY STENT INTERVENTION (PCI-S)  January-February 2006   a. mLAD - Taxus DES 3.0 x 20; b. (11/06/04) staged PCI to RCA Taxus DES 3.5 x 16; c. 01/27/2017: PCI to mLAD prox to old  stent - Synergy DES 3.0 x 12  . UMBILICAL HERNIA REPAIR  Diagnostic Diagram                                                            Post-Intervention Diagram -  RCA FFR 0.86    LAD PCI: Synergy DES 3.0 x 12 mm overlapping proximal edge of previous stent               Current Meds  Medication Sig  . acetaminophen (TYLENOL) 500 MG tablet Take 1,000 mg by mouth 3 (three) times daily as needed for moderate pain or headache.  [name] aspirin 81 MG tablet Take 81 mg by mouth at bedtime.   . Cholecalciferol (VITAMIN D3) 1000 units CAPS Take 1,000 Units by mouth at bedtime.  . clopidogrel (PLAVIX) 75 MG tablet Take 75 mg by mouth daily.  . fexofenadine (ALLEGRA) 180 MG tablet Take 180 mg by mouth daily.   [name] mexiletine (MEXITIL) 200 MG capsule TAKE 1 CAPSULE(200 MG) BY MOUTH TWICE DAILY  . pantoprazole (PROTONIX) 40 MG tablet Take 40 mg by mouth daily.  . ranitidine (ZANTAC) 150 MG capsule Take 150 mg by  mouth daily as needed for heartburn.  . rosuvastatin (CRESTOR) 20 MG tablet Take 20 mg by mouth daily.  . tamsulosin (FLOMAX) 0.4 MG CAPS capsule Take 0.4 mg by mouth daily after supper.   . [DISCONTINUED] psyllium (REGULOID) 0.52 g capsule Take 0.52 g by mouth 2 (two) times daily.    Allergies  Allergen Reactions  . Adhesive [Tape] Rash  . Hydrocodone Rash    Social History   Socioeconomic History  . Marital status: Married    Spouse name: Not on file  . Number of children: 1  . Years of education: Not on file  . Highest education level: Not on file  Occupational History  . Occupation: Land: Retired  Scientific laboratory technician  . Financial resource strain: Not on file  . Food insecurity:    Worry: Not on file    Inability: Not on file  . Transportation needs:    Medical: Not on file    Non-medical: Not on file  Tobacco Use  . Smoking status: Never Smoker  . Smokeless tobacco: Never Used  Substance and Sexual Activity  . Alcohol use: No  . Drug use: No  . Sexual activity: Not on file  Lifestyle  . Physical activity:    Days per week: Not on file    Minutes per session: Not on file  . Stress: Not on file  Relationships  . Social connections:    Talks on phone: Not on file    Gets together: Not on file    Attends religious service: Not on file    Active member of club or organization: Not on file    Attends meetings of clubs or organizations: Not on file    Relationship status: Not on file  Other Topics Concern  . Not on file  Social History Narrative  . Not on file    family history includes Cancer in his father; Glaucoma in his paternal aunt; Heart attack in his brother; Heart disease in his mother; Macular degeneration in his father; Multiple myeloma in his mother; Prostate cancer in his father; Uterine cancer in his mother.  Wt Readings from Last 3 Encounters:  02/22/18 192 lb (  87.1 kg)  01/03/18 196 lb (88.9 kg)  09/23/17 207 lb (93.9 kg)     PHYSICAL EXAM BP 92/60   Pulse 97   Ht 5' 11" (1.803 m)   Wt 192 lb (87.1 kg)   BMI 26.78 kg/m  Physical Exam  Constitutional: He is oriented to person, place, and time. He appears well-developed and well-nourished. No distress.  Well-groomed. Appears younger than stated age  HENT:  Head: Normocephalic and atraumatic.  Mouth/Throat: No oropharyngeal exudate.  Eyes: EOM are normal.  Neck: Normal range of motion. Neck supple. No hepatojugular reflux and no JVD present. Carotid bruit is not present.  Cardiovascular: Normal rate, regular rhythm, normal heart sounds and intact distal pulses.  No extrasystoles are present. PMI is not displaced. Exam reveals no gallop and no friction rub.  No murmur heard. Pulmonary/Chest: Effort normal and breath sounds normal. No respiratory distress. He has no wheezes. He has no rales.  Abdominal: Soft. Bowel sounds are normal. He exhibits no distension. There is no tenderness.  Musculoskeletal: Normal range of motion. He exhibits no edema.  Neurological: He is alert and oriented to person, place, and time.  Psychiatric: He has a normal mood and affect. His behavior is normal. Judgment and thought content normal.  Nursing note and vitals reviewed.    Adult ECG Report None  Other studies Reviewed: Additional studies/ records that were reviewed today include:  Recent Labs:   No results found for: CHOL, HDL, LDLCALC, LDLDIRECT, TRIG, CHOLHDL- PCP checks  Long discussion about his symptoms and different treatment options as well as plan going forward. 45 minutes spent with the patient. Greater than 50% time spent in direct consultation.   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  He seems to be tolerating mexiletine fairly well.  No longer on beta-blocker because of hypotension.  Followed by Dr. Curt Bears.      Relevant Medications   midodrine  (PROAMATINE) 2.5 MG tablet   Presence of drug coated stent in LAD coronary artery (Chronic)    Overlapping DES in the LAD.  Now on lifelong Plavix.  Would be okay to stop aspirin, but he seems to be tolerating it well. Okay to hold Plavix for procedures (5 to 7 days) now as he is greater than 1 year out.       Palpitations    Pretty well controlled with wide-complex tachycardia history on mexiletine.  No longer on beta-blocker.      Hypotension (Chronic)    Blood pressure 90/62 here and he says that he oftentimes has been in the 90s to low 100s, but I think that may be contributing to his fatigue dizziness exercise intolerance.  Recommendation: Liberalize salt intake and adequate hydration  Start  Taking Midodrine 2.5 mg  Every morning and may take an additional tablet in the afternoon if needed.      Relevant Medications   midodrine (PROAMATINE) 2.5 MG tablet   Hyperlipidemia LDL goal <70 (Chronic)    He is on Crestor, tolerating it well.  Should be due to have labs checked by PCP here in June.  I have not seen recent labs.      Relevant Medications   midodrine (PROAMATINE) 2.5 MG tablet   Cardiac syncope (Chronic)    Near syncopal spell with some chest discomfort this weekend.  He is not sure if it was an arrhythmia, He does not think that  the chest pain was angina. Relatively short-lived but just felt poorly afterwards.  Continue to monitor but would continue mexiletine per EP.      Relevant Medications   midodrine (PROAMATINE) 2.5 MG tablet   Atherosclerosis of native coronary artery without angina pectoris - Primary (Chronic)    No anginal symptoms.  Still has the fatigue and lack of energy which is been persistent since before I did a stent.  He did not really get that much better following PCI, mostly because he did not have much symptoms before.  Plan for now will be to allow for mild liberalization of salt intake and hydrate hydration to increase blood pressure.  He is  not on any antihypertensives because of hypotension (i.e. not on beta-blocker or ARB/ACE inhibitor).  On aspirin, Plavix and statin.      Relevant Medications   midodrine (PROAMATINE) 2.5 MG tablet      Current medicines are reviewed at length with the patient today. (+/- concerns) Concern for side effect from mexiletine The following changes have been made: See below  Patient Instructions  Medication instruction  Start  Taking Midodrine 2.5 mg  Every morning and may take an additional tablet in the afternoon if needed.    LIBERALIZE SALT USE     TALK WITH YOUR PRIMARY - IF CMP OR CHOLESTEROL LABS WILL BE OBTAIN BY PRIMARY IF NOT  CALL  DR HARDING'S OFFICE FOR A LABSLIP.    Your physician wants you to follow-up in [place]. You will receive a reminder letter in the mail two months in advance. If you don't receive a letter, please call our office to schedule the follow-up appointment.  If you need a refill on your cardiac medications before your next appointment, please call your pharmacy.    Studies Ordered:   No orders of the defined types were placed in this encounter.     [name], M.D., M.S. Interventional Cardiologist   Pager # [phone] Phone # [phone] [address] [place], [place] [address]

## 2018-02-22 NOTE — Patient Instructions (Signed)
Medication instruction  Start  Taking Midodrine 2.5 mg  Every morning and may take an additional tablet in the afternoon if needed.    LIBERALIZE SALT USE     TALK WITH YOUR PRIMARY - IF CMP OR CHOLESTEROL LABS WILL BE OBTAIN BY PRIMARY IF NOT  CALL  DR HARDING'S OFFICE FOR A LABSLIP.    Your physician wants you to follow-up in [place]. You will receive a reminder letter in the mail two months in advance. If you don't receive a letter, please call our office to schedule the follow-up appointment.  If you need a refill on your cardiac medications before your next appointment, please call your pharmacy.

## 2018-02-22 NOTE — Assessment & Plan Note (Signed)
He is on Crestor, tolerating it well.  Should be due to have labs checked by PCP here in June.  I have not seen recent labs.

## 2018-02-22 NOTE — Assessment & Plan Note (Addendum)
Blood pressure 90/62 here and he says that he oftentimes has been in the 90s to low 100s, but I think that may be contributing to his fatigue dizziness exercise intolerance.  Recommendation: Liberalize salt intake and adequate hydration  Start  Taking Midodrine 2.5 mg  Every morning and may take an additional tablet in the afternoon if needed.

## 2018-02-22 NOTE — Assessment & Plan Note (Signed)
Did not tolerate amiodarone.  He seems to be tolerating mexiletine fairly well.  No longer on beta-blocker because of hypotension.  Followed by Dr. Curt Bears.

## 2018-03-07 ENCOUNTER — Telehealth: Payer: Self-pay | Admitting: Cardiology

## 2018-03-07 DIAGNOSIS — I951 Orthostatic hypotension: Secondary | ICD-10-CM

## 2018-03-07 DIAGNOSIS — R42 Dizziness and giddiness: Secondary | ICD-10-CM

## 2018-03-07 DIAGNOSIS — R2681 Unsteadiness on feet: Secondary | ICD-10-CM

## 2018-03-07 DIAGNOSIS — R55 Syncope and collapse: Secondary | ICD-10-CM

## 2018-03-07 NOTE — Telephone Encounter (Signed)
Pt calling   Pt was told to call back and give an update after he take is medication. Please call pt

## 2018-03-07 NOTE — Telephone Encounter (Signed)
Spoke with pt wife, she does not want to talk to anyone but sharon. aware sharon is in clinic and can not assure what time she would be able to call her back. She was fine with that, I will forward this note to sharon.

## 2018-03-07 NOTE — Telephone Encounter (Signed)
Follow up    Patient wife is calling on his behalf. She states that her husband is just tired, fatigued and overall not good. She wants to speak with a nurse immediately, She insist that Dr. [name] find out whats wrong with her husband. But if he can not she will go elsewhere. Please call.

## 2018-03-07 NOTE — Telephone Encounter (Signed)
I do not think he is having any kind of adverse reaction to midodrine.  However if he feels worse then stop it.  The intention was to allow for increased blood pressure. About the only thing I can think about is that we need to talk to EP about mexiletine, may be this is having an effect.  He is not on any blood pressure medications anymore because of the hypotension.  Short of wearing support stockings and increasing hydration, at this point without midodrine I do not have many other options  [name], MD

## 2018-03-07 NOTE — Telephone Encounter (Signed)
Spoke with pt, aware of dr harding's recommendations. He is going to give dr Curt Bears office about a sooner appt than 04-25-18 to discuss mexiletine.

## 2018-03-07 NOTE — Telephone Encounter (Signed)
Spoke with pt, he was started on midodrine about 2 weeks ago. He thinks have gotten worse. He reports no balance at all, this is a long standing problem but is worse now. He also reports when standing from sitting he will almost pass out. He also reports his heart races with any exertion. He does report his blood pressure is higher than what it was before starting the medication but he feels the symptoms are worse. He thinks he is having an adverse reaction to the midodrine. Will forward to dr harding to review and advise

## 2018-03-08 NOTE — Telephone Encounter (Signed)
Spoke to wife. She states she and patient are very concerned about patient symptoms.  She states patient received message from yesterday of  Information from DR [facility]. She wanted to know if there is something else that maybe tested- like  ( ie. Cath or look at artery in patient necks) states patient  Not eager to revisit  Th medication that was tried  Earlier by Dr Curt Bears. RN explained to wife- that Dr [name] recommend patient to see Dr Curt Bears again , there maybe more medication options to help. patient is not on any  Blood pressure medication currently.   RN discussed with DR [name], okay to order carotid doppler, no cath is needed - patient not having any symptoms that warrant a cath. Patient should liberal use of salt and wear compression/support socks   INFORMATION GIVEN TO WIFE- she aware . She states he is using salt, but not wearing compression socks. She states he has some and will have patient to wear them. Aware scheduler will call about appointment. Wanted to know if patient has an appointment with DR [name]- RN informed wife -await until dopplers are completed to see what the results are.

## 2018-03-10 ENCOUNTER — Other Ambulatory Visit: Payer: Self-pay | Admitting: Cardiology

## 2018-03-10 DIAGNOSIS — R55 Syncope and collapse: Principal | ICD-10-CM

## 2018-03-10 DIAGNOSIS — R2681 Unsteadiness on feet: Secondary | ICD-10-CM

## 2018-03-10 DIAGNOSIS — R42 Dizziness and giddiness: Secondary | ICD-10-CM

## 2018-03-11 ENCOUNTER — Other Ambulatory Visit: Payer: Self-pay | Admitting: Cardiology

## 2018-03-11 ENCOUNTER — Ambulatory Visit (HOSPITAL_COMMUNITY)
Admission: RE | Admit: 2018-03-11 | Discharge: 2018-03-11 | Disposition: A | Discharge status: Home / Self Care | Payer: Medicare Other | Source: Ambulatory Visit | Attending: Cardiology | Admitting: Cardiology

## 2018-03-11 DIAGNOSIS — I2510 Atherosclerotic heart disease of native coronary artery without angina pectoris: Secondary | ICD-10-CM | POA: Insufficient documentation

## 2018-03-11 DIAGNOSIS — R2681 Unsteadiness on feet: Secondary | ICD-10-CM

## 2018-03-11 DIAGNOSIS — R42 Dizziness and giddiness: Secondary | ICD-10-CM | POA: Diagnosis present

## 2018-03-11 DIAGNOSIS — R55 Syncope and collapse: Principal | ICD-10-CM

## 2018-03-11 DIAGNOSIS — I6523 Occlusion and stenosis of bilateral carotid arteries: Secondary | ICD-10-CM

## 2018-03-11 DIAGNOSIS — E119 Type 2 diabetes mellitus without complications: Secondary | ICD-10-CM | POA: Insufficient documentation

## 2018-03-11 DIAGNOSIS — E785 Hyperlipidemia, unspecified: Secondary | ICD-10-CM | POA: Insufficient documentation

## 2018-03-21 ENCOUNTER — Other Ambulatory Visit: Payer: Self-pay | Admitting: Cardiology

## 2018-04-25 ENCOUNTER — Ambulatory Visit (INDEPENDENT_AMBULATORY_CARE_PROVIDER_SITE_OTHER): Payer: Medicare Other | Admitting: Cardiology

## 2018-04-25 ENCOUNTER — Encounter: Payer: Self-pay | Admitting: Cardiology

## 2018-04-25 VITALS — BP 110/68 | HR 76 | Ht 71.0 in | Wt 188.8 lb

## 2018-04-25 DIAGNOSIS — I472 Ventricular tachycardia, unspecified: Secondary | ICD-10-CM

## 2018-04-25 DIAGNOSIS — I6523 Occlusion and stenosis of bilateral carotid arteries: Secondary | ICD-10-CM | POA: Diagnosis not present

## 2018-04-25 DIAGNOSIS — I2510 Atherosclerotic heart disease of native coronary artery without angina pectoris: Secondary | ICD-10-CM | POA: Diagnosis not present

## 2018-04-25 DIAGNOSIS — E785 Hyperlipidemia, unspecified: Secondary | ICD-10-CM | POA: Diagnosis not present

## 2018-04-25 MED ORDER — DILTIAZEM HCL ER COATED BEADS 120 MG PO CP24: 120.0000 mg | ORAL_CAPSULE | Freq: Every day | ORAL | 3 refills | Status: DC

## 2018-04-25 NOTE — Progress Notes (Signed)
Labs (from PCP) 03/18/2017 - TC 117, TG 92, HDL 35, ?LDL Direct 64  Pretty good Labs.  [name], MD

## 2018-04-25 NOTE — Progress Notes (Signed)
Electrophysiology Office Note   Date:  04/25/2018   ID:  Franki Stemen, DOB 01/12/1941, MRN [id number]  PCP:  [name], MD  Cardiologist:  [name] Primary Electrophysiologist:  Mishti Swanton [name], MD    Chief Complaint  Patient presents with  . Follow-up    Ventricular tachycardia     History of Present Illness: Parth Mccormac is a 77 y.o. male who presents today for electrophysiology evaluation.   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 During his evaluation for syncope, a Linq was inserted and a wide complex tachycardia was seen. He was started on flecainide 50 mg a day. EP study in 2014 showed a normal HV interval with no inducible arrhythmias at baseline or with 28 g/m of Isuprel. A nonsustained long RP atrial tachycardia was induced. Prior to starting flecainide, he complained of intermittent episodes of palpitations dizziness and lightheadedness. At times she also was presyncopal. The cycle length of the wide complex tachycardia was 220 msec. Flecainide has been stopped due to his coronary artery disease. He was started on amiodarone but did not tolerate this. Was started on Mexetiline.  He says that he has been feeling palpitations that occur mainly when he is up and exerting himself.  He has not had similar symptoms to his ventricular tachycardia.  He says that his heart rate gets in the low 100s when he is walking across the room and it makes him fatigued.  He also gets shortness of breath with exertion.  Today, denies symptoms of chest pain, orthopnea, PND, lower extremity edema, claudication, dizziness, presyncope, syncope, bleeding, or neurologic sequela. The patient is tolerating medications without difficulties.    Past Medical History:  Diagnosis Date  . Allergy   . Arthritis   . 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 basal cell skin cancer   . CAD S/P percutaneous coronary angioplasty 11/03/2004   a. mLAD - Taxus DES 3.0 x 20; b. (11/06/04) staged PCI to RCA Taxus DES 3.5 x 16; c. (01/2017).  @ [facility] (Dr. [name]) Synergy DES 3.0 x 12 (for 99% lesion @ prox edge of old stent  . Cardiac syncope    No episode since starting flecainide. Had documented WIDE COMPLEX Tachycardia on loop recorder.  . Cataract bilateral cataracts  . Diverticulosis   . Esophageal stricture   . GERD (gastroesophageal reflux disease)   . Hiatal hernia 11/05/2010  . HLD (hyperlipidemia)   . Nonsustained paroxysmal ventricular tachycardia ([place]) 2014   Presumably controlled with flecainide. Unable to induce during EP study.  . Reflux gastritis   . STEMI involving left anterior descending coronary artery ([place]) 11/03/2004   [facility]: Occluded LAD treated with DES. Staged PCI to the RCA.   Past Surgical History:  Procedure Laterality Date  . CARDIAC CATHETERIZATION  ~2011   [facility]: re-look Cath - patent stents  . CORONARY STENT INTERVENTION N/A 01/27/2017   Procedure: Coronary Stent Intervention;  Surgeon: [name], MD;  Location: [place] [facility]: mLAD (just after D1) overlapping prior DES -> SYNERGY DES 3X12 drug eluting stent  . ELECTROPHYSIOLOGIC STUDY  2014   Unable to stimulate VT seen on loop recorder  . implanted loop heart monitor x2  ~2011; 2014   Per report- batter out of date; apparently captured a prolonged episode  of wide complex tachycardia associated with an episode of weakness/near syncope  . INTRAVASCULAR PRESSURE WIRE/FFR STUDY N/A 01/27/2017   Procedure: Intravascular Pressure Wire/FFR Study;  Surgeon: [name], MD;  Location: [place] [facility];  Service: Cardiovascular;  Laterality: N/A;  . LEFT HEART CATH AND CORONARY ANGIOGRAPHY N/A 01/27/2017   Procedure: Left Heart Cath and Coronary Angiography;  Surgeon: [name], MD;  Location: [facility] INVASIVE CV  LAB: mLAD 99% stenosis pre-Stent & ISR --> PCI. Ost DI ~50%. pRCA DES ~50-60% FFR 0.86.  [name] NM MYOVIEW LTD  07/2014   Unremarkable EKG. No evidence of ischemia or infarct. Mild anterior attenuation, cannot exclude prior infarct, but nonreversible. EF 66%.  [name] NM MYOVIEW LTD  12/22/2016   INTERMEDIATE RISK. EF 58%. Defect 1: Medium size severe defect in the mid anterior, apical anterior and apical wall consistent with prior anterior MI. Defect to: Large defect and moderate severity in the basal inferoseptal basal inferior and inferoseptal mid inferior wall consistent with ischemia.  [name] PERCUTANEOUS CORONARY STENT INTERVENTION (PCI-S)  January-February 2006   a. mLAD - Taxus DES 3.0 x 20; b. (11/06/04) staged PCI to RCA Taxus DES 3.5 x 16; c. 01/27/2017: PCI to mLAD prox to old  stent - Synergy DES 3.0 x 12  . UMBILICAL HERNIA REPAIR       Current Outpatient Medications  Medication Sig Dispense Refill  . acetaminophen (TYLENOL) 500 MG tablet Take 1,000 mg by mouth 3 (three) times daily as needed for moderate pain or headache.    [name] aspirin 81 MG tablet Take 81 mg by mouth at bedtime.     . Cholecalciferol (VITAMIN D3) 1000 units CAPS Take 1,000 Units by mouth at bedtime.    . clopidogrel (PLAVIX) 75 MG tablet TAKE 1 TABLET(75 MG) BY MOUTH DAILY 30 tablet 8  . fexofenadine (ALLEGRA) 180 MG tablet Take 180 mg by mouth daily.     [name] mexiletine (MEXITIL) 200 MG capsule TAKE 1 CAPSULE(200 MG) BY MOUTH TWICE DAILY 180 capsule 2  . pantoprazole (PROTONIX) 40 MG tablet Take 40 mg by mouth daily.    . ranitidine (ZANTAC) 150 MG capsule Take 150 mg by mouth daily as needed for heartburn.    . rosuvastatin (CRESTOR) 20 MG tablet Take 20 mg by mouth daily.    . tamsulosin (FLOMAX) 0.4 MG CAPS capsule Take 0.4 mg by mouth daily after supper.      No current facility-administered medications for this visit.     Allergies:   Silver sulfadiazine; Adhesive [tape]; and Hydrocodone   Social History:  The patient   reports that he has never smoked. He has never used smokeless tobacco. He reports that he does not drink alcohol or use drugs.   Family History:  The patient's family history includes Cancer in his father; Glaucoma in his paternal aunt; Heart attack in his brother; Heart disease in his mother; Macular degeneration in his father; Multiple myeloma in his mother; Prostate cancer in his father; Uterine cancer in his mother.   ROS:  Please see the history of present illness.   Otherwise, review of systems is positive for DOE.   All other systems are reviewed and negative.   PHYSICAL EXAM: VS:  BP 110/68   Pulse 76   Ht 5' 11"  (1.803 m)   Wt 188 lb 12.8 oz (85.6 kg)   SpO2 97%   BMI 26.33 kg/m  , BMI Body mass index is 26.33 kg/m. GEN: Well nourished, well  developed, in no acute distress  HEENT: normal  Neck: no JVD, carotid bruits, or masses Cardiac: RRR; no murmurs, rubs, or gallops,no edema  Respiratory:  clear to auscultation bilaterally, normal work of breathing GI: soft, nontender, nondistended, + BS MS: no deformity or atrophy  Skin: warm and dry Neuro:  Strength and sensation are intact Psych: euthymic mood, full affect  EKG:  EKG is ordered today. Personal review of the ekg ordered shows SR, 1dAVB, PVCs   Recent Labs: No results found for requested labs within last 8760 hours.    Lipid Panel  No results found for: CHOL, TRIG, HDL, CHOLHDL, VLDL, LDLCALC, LDLDIRECT   Wt Readings from Last 3 Encounters:  04/25/18 188 lb 12.8 oz (85.6 kg)  02/22/18 192 lb (87.1 kg)  01/03/18 196 lb (88.9 kg)      Other studies Reviewed: Additional studies/ records that were reviewed today include: SPECT 12/22/16  Review of the above records today demonstrates:   The left ventricular ejection fraction is normal (55-65%).  Nuclear stress EF: 58%.  Defect 1: There is a medium defect of severe severity present in the mid anterior, apical anterior and apex location consistent with a  previous anterior apical MI  Defect 2: There is a large defect of moderate severity present in the basal inferoseptal, basal inferior, mid inferoseptal, mid inferior and apical inferior location that is c/w inferior ischemia.  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  This is an intermediate risk study.   01/27/17 LHC  Mid LAD-1 lesion, 99 %stenosed - proximal to & involving the proximal portion of the previous stent.  A STENT SYNERGY DES 3X12 drug eluting stent was successfully placed, and overlaps previously placed stent.  Post intervention, there is a 0% residual stenosis.  _____________  Prox RCA-1 lesion, 55 %stenosed - In-stent Re-stenosis: FFR 0.86  The left ventricular ejection fraction is 50-55% by visual estimate.  Post intervention, there is a 0% residual stenosis.   Severe single-vessel disease severe stenosis in the LAD involving the proximal portion of the petrous the placed stent. This was treated with a single DES stent after angioplasty.  FFR negative lesion in the RCA.  I suspect that in the setting of the severe LAD lesion, this lesion was physiologically significant on the Myoview stress test. No longer significant following PCI of LAD.  ASSESSMENT AND PLAN:  1.  Coronary artery disease status post RCA and LAD interventions with stable angina: No current chest pain.  Continue antiplatelets.  No changes.  2. Wide complex tachycardia: Currently on mexiletine.  Has not had further ventricular tachycardia.  He does have symptoms of palpitations when he is up and moving about.  Jac Romulus start diltiazem 120 mg.  3. Hyperlipidemia: Continue Crestor  Current medicines are reviewed at length with the patient today.   The patient does not have concerns regarding his medicines.  The following changes were made today: Start diltiazem  Labs/ tests ordered today include:  Orders Placed This Encounter  Procedures  . EKG  12-Lead     Disposition:   FU with Mea Ozga 3 months  Signed, Adelma Bowdoin [name], MD  04/25/2018 8:42 AM     [facility] [address] [place] [address] [phone] (office) [phone] (fax)

## 2018-04-25 NOTE — Patient Instructions (Addendum)
Medication Instructions:  Your physician has recommended you make the following change in your medication:  1. START Diltiazem 120 mg once daily  * If you need a refill on your cardiac medications before your next appointment, please call your pharmacy.   Labwork: None ordered *We will only notify you of abnormal results, otherwise continue current treatment plan.  Testing/Procedures: None ordered  Follow-Up: Your physician recommends that you schedule a follow-up appointment in: 3 months with Dr. Curt Bears.   *Please note that any paperwork needing to be filled out by the provider will need to be addressed at the front desk prior to seeing the provider. Please note that any FMLA, disability or other documents regarding health condition is subject to a $25.00 charge that must be received prior to completion of paperwork in the form of a money order or check.  Thank you for choosing [facility]!!   Trinidad Curet, RN [phone]  Any Other Special Instructions Will Be Listed Below (If Applicable).   Diltiazem tablets What is this medicine? DILTIAZEM (dil TYE a zem) is a calcium-channel blocker. It affects the amount of calcium found in your heart and muscle cells. This relaxes your blood vessels, which can reduce the amount of work the heart has to do. This medicine is used to treat chest pain caused by angina. This medicine may be used for other purposes; ask your health care provider or pharmacist if you have questions. COMMON BRAND NAME(S): Cardizem What should I tell my health care provider before I take this medicine? They need to know if you have any of these conditions: -heart problems, low blood pressure, irregular heartbeat -liver disease -previous heart attack -an unusual or allergic reaction to diltiazem, other medicines, foods, dyes, or preservatives -pregnant or trying to get pregnant -breast-feeding How should I use this medicine? Take this medicine by mouth  with a glass of water. Follow the directions on the prescription label. Do not cut, crush or chew this medicine. This medicine is usually taken before meals and at bedtime. Take your doses at regular intervals. Do not take your medicine more often then directed. Do not stop taking except on the advice of your doctor or health care professional. Talk to your pediatrician regarding the use of this medicine in children. Special care may be needed. Overdosage: If you think you have taken too much of this medicine contact a poison control center or emergency room at once. NOTE: This medicine is only for you. Do not share this medicine with others. What if I miss a dose? If you miss a dose, take it as soon as you can. If it is almost time for your next dose, take only that dose. Do not take double or extra doses. What may interact with this medicine? Do not take this medicine with any of the following: -cisapride -hawthorn -pimozide -ranolazine -red yeast rice This medicine may also interact with the following medications: -buspirone -carbamazepine -cimetidine -cyclosporine -digoxin -local anesthetics or general anesthetics -lovastatin -medicines for anxiety or difficulty sleeping like midazolam and triazolam -medicines for high blood pressure or heart problems -quinidine -rifampin, rifabutin, or rifapentine This list may not describe all possible interactions. Give your health care provider a list of all the medicines, herbs, non-prescription drugs, or dietary supplements you use. Also tell them if you smoke, drink alcohol, or use illegal drugs. Some items may interact with your medicine. What should I watch for while using this medicine? Check your blood pressure and pulse rate regularly.  Ask your doctor or health care professional what your blood pressure and pulse rate should be and when you should contact him or her. You may feel dizzy or lightheaded. Do not drive, use machinery, or do  anything that needs mental alertness until you know how this medicine affects you. To reduce the risk of dizzy or fainting spells, do not sit or stand up quickly, especially if you are an older patient. Alcohol can make you more dizzy or increase flushing and rapid heartbeats. Avoid alcoholic drinks. What side effects may I notice from receiving this medicine? Side effects that you should report to your doctor or health care professional as soon as possible: -allergic reactions like skin rash, itching or hives, swelling of the face, lips, or tongue -confusion, mental depression -feeling faint or lightheaded, falls -pinpoint red spots on the skin -redness, blistering, peeling or loosening of the skin, including inside the mouth -slow, irregular heartbeat -swelling of the ankles, feet -unusual bleeding or bruising Side effects that usually do not require medical attention (report to your doctor or health care professional if they continue or are bothersome): -change in sex drive or performance -constipation or diarrhea -flushing of the face -headache -nausea, vomiting -tired or weak -trouble sleeping This list may not describe all possible side effects. Call your doctor for medical advice about side effects. You may report side effects to FDA at 1-800-FDA-1088. Where should I keep my medicine? Keep out of the reach of children. Store at room temperature between 20 and 25 degrees C (68 and 77 degrees F). Protect from light. Keep container tightly closed. Throw away any unused medicine after the expiration date. NOTE: This sheet is a summary. It may not cover all possible information. If you have questions about this medicine, talk to your doctor, pharmacist, or health care provider.  2018 Elsevier/Gold Standard (2013-09-04 10:54:31)

## 2018-07-18 ENCOUNTER — Encounter: Payer: Self-pay | Admitting: *Deleted

## 2018-07-26 ENCOUNTER — Ambulatory Visit: Payer: Medicare Other | Admitting: Cardiology

## 2018-08-04 ENCOUNTER — Telehealth: Payer: Self-pay | Admitting: Cardiology

## 2018-08-04 NOTE — Telephone Encounter (Signed)
Spoke with pt wife. DPR on file. Pt wife sts that she received a call from the pt while he was at work. Pt call to complain of an episode of sob and weakness, by the time he called his wife his symptoms had resolved. Pt wife adv the pt to call the ambulance and be transported to the ED. Pt refused. Pt wife sts that the pt has a long hx of these episodes along with syncope. Pt wife wants the pt seen by Dr.Harding only. appt scheduled with Dr.Harding on 08/10/18 @ 2:40pm. Adv pt that the pt is to go to the ED if he has any reoccurrence of the episode or develops symptoms. Pt wife verbalized understanding and voiced appreciation for the assistance.

## 2018-08-04 NOTE — Telephone Encounter (Signed)
° ° °  Patient's spouse calling to report weakness and SOB. Declined next available appt with APP on 11/5. Requesting to speak with nurse   Pt c/o Shortness Of Breath: STAT if SOB developed within the last 24 hours or pt is noticeably SOB on the phone  1. Are you currently SOB (can you hear that pt is SOB on the phone)? SPOUSE CALLING, PATIENT IS AT WORK  2. How long have you been experiencing SOB? UNSURE PER SPOUSE  3. Are you SOB when sitting or when up moving around? UNSURE PER SPOUSE  4. Are you currently experiencing any other symptoms? WEAKNESS

## 2018-08-10 ENCOUNTER — Ambulatory Visit (INDEPENDENT_AMBULATORY_CARE_PROVIDER_SITE_OTHER): Payer: Medicare Other | Admitting: Cardiology

## 2018-08-10 ENCOUNTER — Encounter: Payer: Self-pay | Admitting: Cardiology

## 2018-08-10 VITALS — BP 110/74 | HR 78 | Ht 71.0 in | Wt 197.4 lb

## 2018-08-10 DIAGNOSIS — Z955 Presence of coronary angioplasty implant and graft: Secondary | ICD-10-CM | POA: Diagnosis not present

## 2018-08-10 DIAGNOSIS — I472 Ventricular tachycardia: Secondary | ICD-10-CM

## 2018-08-10 DIAGNOSIS — I4729 Other ventricular tachycardia: Secondary | ICD-10-CM

## 2018-08-10 DIAGNOSIS — I2510 Atherosclerotic heart disease of native coronary artery without angina pectoris: Secondary | ICD-10-CM

## 2018-08-10 DIAGNOSIS — R55 Syncope and collapse: Secondary | ICD-10-CM

## 2018-08-10 DIAGNOSIS — I6523 Occlusion and stenosis of bilateral carotid arteries: Secondary | ICD-10-CM | POA: Diagnosis not present

## 2018-08-10 DIAGNOSIS — E785 Hyperlipidemia, unspecified: Secondary | ICD-10-CM

## 2018-08-10 MED ORDER — DILTIAZEM HCL ER COATED BEADS 120 MG PO CP24: 120.0000 mg | ORAL_CAPSULE | Freq: Every day | ORAL | 3 refills | Status: DC

## 2018-08-10 NOTE — Assessment & Plan Note (Signed)
Intolerant of amiodarone, converted to mexiletine by Dr. Curt Bears. Unfortunately, we really do not have a good monitor option for him due [place].  At this point, as far as I can tell he really has not had that many spells of wide-complex tachycardia.  Question if perhaps we could try actually see how he does for short-term off of mexiletine in order to confirm or deny whether or not some of his symptoms are related to being on mexiletine. I have actually told to restart the diltiazem that he had previously stopped after 1 month.  This would potentially least help some of his heart rate episodes.  Question if the wide-complex tachycardia could potentially have been ischemic mediated and now that he is revascularized may not be as relevant.  However he did have that one episode few weeks ago.

## 2018-08-10 NOTE — Progress Notes (Signed)
PCP: [name], MD  Clinic Note: Chief Complaint  Patient presents with  . Follow-up    Concerns about mexiletine  . Coronary Artery Disease    No angina  . Near Syncope    One episode    HPI: Alexander Armstrong is a 77 y.o. male with a PMH below who presents today for 69-monthfollow-up For CAD-PCI as well as a history of wide complex tachycardia.  CAldrickwas a long-term patient of Dr. TElonda Huskybased out of [employer] [employer] -- maintained on Flecainide for years - for 1 documented NSVT episode.  I first saw [name] the patient in March 2018 (his wife BHorris Latinois a patient of mine) to establish cardiology care. We actually did a stress test for routine evaluation that was positive and he ended up undergoing cardiac catheterization with PCI to the LAD.   CAD Hx:   Anterior STEMI status post PCI to the LAD followed by staged PCI to the RCA in 2006.  --> mLAD - Taxus DES 3.0 x 20; b. (11/06/04) staged PCI to RCA Taxus DES 3.5 x 16  WCT noted on ILR --> started on Flecainide (obviously stopped in 2018 following LAD PCI --> now on Mexiletine ([name]  [place]- 12/2016 - done with initial New Patient assessment.  INTERMEDIATE RISK. EF 58%. Defect 1: Medium size severe defect in the mid anterior, apical anterior and apical wall consistent with prior anterior MI. Defect 2: Large defect and moderate severity in the basal inferoseptal basal inferior and inferoseptal mid inferior wall consistent with ischemia.  (01/2017).  @ [facility] (Dr. HEllyn Hack CATH-PCI LAD: Synergy DES 3.0 x 12 (for 99% lesion @ prox edge of old stent  Referred to by [name](EP) 04/25/2017 - started Diltiazem, continued Mexilitine. - no more VT.  Still palpitations.  Recent Hospitalizations: none  Studies Personally Reviewed - (if available, images/films reviewed: From Epic Chart or Care Everywhere)  None  Interval History: CTarezreturns here today for routine follow-up, stating that he preferred to see me first before  seeing [name]  He has noted over the last few months actually since he last saw Dr. CCurt Bearsthat he is still been having episodes where his heart rate will go from the low 50s up to as high as 100 110 beats a minute.  He has a wrist monitor which monitors his heart rate and it does show significant up-and-down spiking's.  But no significant tachycardia. He did not notice any benefit of diltiazem so he stopped it.  He said he had one episode a few weeks ago where he felt somewhat dizzy and lightheaded with a cold sensation all over that is very similar to his previous near syncope episodes.  He did feel dizzy and lightheaded lasted a couple minutes but he never did actually pass out.  Just for about 3 to 4 hours afterwards he just felt whipped.  This the first these episodes he had in over 6 months may be even more than a year.  He is not really noticing the same extent of dizziness fatigue that he had before.  Not necessarily noticing any resting or exertional chest tightness or pressure (he never really did).  He does not really complain of exercise intolerance, but is just not doing much.  No PND, orthopnea or edema.  No TIA/amaurosis fugax.  Remainder of cardiac review of symptoms: Cardiovascular ROS: no chest pain or dyspnea on exertion positive for - irregular heartbeat, rapid heart rate  and Changes in heart rate as noted above. negative for - chest pain, dyspnea on exertion, edema, irregular heartbeat, orthopnea, paroxysmal nocturnal dyspnea, shortness of breath or TIA/amaurosis fugax symptoms, claudication  ROS: A comprehensive was performed. Review of Systems  Constitutional: Positive for malaise/fatigue ( exercise intolerance).  HENT: Negative for congestion, nosebleeds and sore throat (hoarse voice).   Respiratory: Negative for cough, shortness of breath (Only with exertion beyond routine activity) and wheezing.   Gastrointestinal: Negative for abdominal pain, blood in stool, melena,  nausea and vomiting.  Genitourinary: Negative for hematuria.  Musculoskeletal: Negative for falls, joint pain and neck pain.  Neurological: Positive for dizziness (Only sometimes positional). Negative for weakness.       Near syncope this weekend x 1 - otherwise none.  Psychiatric/Behavioral: Negative for memory loss. The patient is not nervous/anxious.   All other systems reviewed and are negative.  I have reviewed and (if needed) personally updated the patient's problem list, medications, allergies, past medical and surgical history, social and family history.   Past Medical History:  Diagnosis Date  . Allergy   . Arthritis   . 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 basal cell skin cancer   . CAD S/P percutaneous coronary angioplasty 11/03/2004   a. mLAD - Taxus DES 3.0 x 20; b. (11/06/04) staged PCI to RCA Taxus DES 3.5 x 16; c. (01/2017).  @ [facility] (Dr. [name]) Synergy DES 3.0 x 12 (for 99% lesion @ prox edge of old stent  . Cardiac syncope    No episode since starting flecainide. Had documented WIDE COMPLEX Tachycardia on loop recorder.  . Cataract bilateral cataracts  . Diverticulosis   . Esophageal stricture   . GERD (gastroesophageal reflux disease)   . Hiatal hernia 11/05/2010  . HLD (hyperlipidemia)   . 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 Unable to induce during EP study.  . Reflux gastritis   . STEMI involving left anterior descending coronary artery ([name]) 11/03/2004   [facility]: Occluded LAD treated with DES. Staged PCI to the RCA.    Past Surgical History:  Procedure Laterality Date  . CARDIAC CATHETERIZATION  ~2011   [facility]: re-look Cath - patent stents  . CORONARY STENT INTERVENTION N/A 01/27/2017   Procedure: Coronary Stent Intervention;  Surgeon: [name], MD;  Location: [place] [facility]: mLAD (just after D1) overlapping prior DES -> SYNERGY DES 3X12 drug eluting stent  .  ELECTROPHYSIOLOGIC STUDY  2014   Unable to stimulate VT seen on loop recorder  . implanted loop heart monitor x2  ~2011; 2014   Per report- batter out of date; apparently captured a prolonged episode of wide complex tachycardia associated with an episode of weakness/near syncope  . INTRAVASCULAR PRESSURE WIRE/FFR STUDY N/A 01/27/2017   Procedure: Intravascular Pressure Wire/FFR Study;  Surgeon: [name], MD;  Location: [place] [facility];  Service: Cardiovascular;  Laterality: N/A;  . LEFT HEART CATH AND CORONARY ANGIOGRAPHY N/A 01/27/2017   Procedure: Left Heart Cath and Coronary Angiography;  Surgeon: [name], MD;  Location: [facility] INVASIVE CV LAB: mLAD 99% stenosis pre-Stent & ISR --> PCI. Ost DI ~50%. pRCA DES ~50-60% FFR 0.86.  [name] NM MYOVIEW LTD  07/2014   Unremarkable EKG. No evidence of ischemia or infarct. Mild anterior attenuation, cannot exclude prior infarct, but nonreversible. EF 66%.  [name] NM MYOVIEW LTD  12/22/2016   INTERMEDIATE RISK. EF 58%. Defect 1: Medium size severe defect  in the mid anterior, apical anterior and apical wall consistent with prior anterior MI. Defect to: Large defect and moderate severity in the basal inferoseptal basal inferior and inferoseptal mid inferior wall consistent with ischemia.  [name] PERCUTANEOUS CORONARY STENT INTERVENTION (PCI-S)  January-February 2006   a. mLAD - Taxus DES 3.0 x 20; b. (11/06/04) staged PCI to RCA Taxus DES 3.5 x 16; c. 01/27/2017: PCI to mLAD prox to old  stent - Synergy DES 3.0 x 12  . UMBILICAL HERNIA REPAIR      Diagnostic Diagram                                                            Post-Intervention Diagram -  RCA FFR 0.86 LAD PCI: Synergy DES 3.0 x 12 mm overlapping proximal edge of previous stent               Current Meds  Medication Sig  . acetaminophen (TYLENOL) 500 MG tablet Take 1,000 mg by mouth 3 (three) times daily as needed for moderate pain or headache.  . Cholecalciferol (VITAMIN D3) 1000 units CAPS  Take 1,000 Units by mouth at bedtime.  . clopidogrel (PLAVIX) 75 MG tablet TAKE 1 TABLET(75 MG) BY MOUTH DAILY  . fexofenadine (ALLEGRA) 180 MG tablet Take 180 mg by mouth daily.   [name] mexiletine (MEXITIL) 200 MG capsule TAKE 1 CAPSULE(200 MG) BY MOUTH TWICE DAILY  . pantoprazole (PROTONIX) 40 MG tablet Take 40 mg by mouth daily.  . ranitidine (ZANTAC) 150 MG capsule Take 150 mg by mouth daily as needed for heartburn.  . rosuvastatin (CRESTOR) 20 MG tablet Take 20 mg by mouth daily.  . tamsulosin (FLOMAX) 0.4 MG CAPS capsule Take 0.4 mg by mouth daily after supper.   . [DISCONTINUED] aspirin 81 MG tablet Take 81 mg by mouth at bedtime.     Allergies  Allergen Reactions  . Silver Sulfadiazine Hives and Rash  . Adhesive [Tape] Rash  . Hydrocodone Rash   Social History   Tobacco Use  . Smoking status: Never Smoker  . Smokeless tobacco: Never Used  Substance Use Topics  . Alcohol use: No  . Drug use: No   Social History   Social History Narrative  . Not on file    family history includes Cancer in his father; Glaucoma in his paternal aunt; Heart attack in his brother; Heart disease in his mother; Macular degeneration in his father; Multiple myeloma in his mother; Prostate cancer in his father; Uterine cancer in his mother.  Wt Readings from Last 3 Encounters:  08/10/18 197 lb 6.4 oz (89.5 kg)  04/25/18 188 lb 12.8 oz (85.6 kg)  02/22/18 192 lb (87.1 kg)    PHYSICAL EXAM BP 110/74   Pulse 78   Ht 5' 11"  (1.803 m)   Wt 197 lb 6.4 oz (89.5 kg)   BMI 27.53 kg/m  Physical Exam  Constitutional: He is oriented to person, place, and time. He appears well-developed and well-nourished. No distress.  Well-groomed. Appears younger than stated age  HENT:  Head: Normocephalic and atraumatic.  Mouth/Throat: No oropharyngeal exudate.  Eyes: EOM are normal.  Neck: Normal range of motion. Neck supple. No hepatojugular reflux and no JVD present. Carotid bruit is not present.    Cardiovascular: Normal rate, regular rhythm, normal  heart sounds and intact distal pulses.  Occasional extrasystoles are present. PMI is not displaced. Exam reveals no gallop and no friction rub.  No murmur heard. Pulmonary/Chest: Effort normal and breath sounds normal. No respiratory distress. He has no wheezes. He has no rales.  Abdominal: Soft. Bowel sounds are normal. He exhibits no distension. There is no tenderness. There is no rebound.  No HSM  Musculoskeletal: Normal range of motion. He exhibits no edema.  Neurological: He is alert and oriented to person, place, and time.  Psychiatric: He has a normal mood and affect. His behavior is normal. Judgment and thought content normal.  Nursing note and vitals reviewed.    Adult ECG Report None  Other studies Reviewed: Additional studies/ records that were reviewed today include:  Recent Labs:   No results found for: CHOL, HDL, LDLCALC, LDLDIRECT, TRIG, CHOLHDL- PCP checks -labs not available  ASSESSMENT / PLAN: Problem List Items Addressed This Visit    Cardiac syncope (Chronic)    He had yet again another episode of near syncope this was more consistent with what he used to have.  I just do not know if this is truly related to an SVT or not.  It would be really nice to have a reliable way to monitor these episodes as well as his intermittent bouts of tachycardia. Unfortunately we have tried several monitored devices that have not been all that helpful.  In the past these have loop recorder was, but after over 6 years with 2 devices only one episode noted.  Question the benefit of mexiletine --will defer to Dr. Curt Bears, but perhaps we could try being off mexiletine and just on diltiazem.  He seems to be prone to having adverse symptoms with any medication we try      Relevant Medications   diltiazem (CARDIZEM CD) 120 MG 24 hr capsule   Other Relevant Orders   EKG 12-Lead   Coronary artery disease involving native coronary artery of  native heart without angina pectoris - Primary (Chronic)    He truthfully never really had any anginal symptoms despite having significant LAD stenosis.  Not really even sure what his symptoms would be.  He said he felt better after his stent, but now says that he has not felt well since the stent, however he cannot say for sure what the reason is.  Plan: Stop aspirin, continue Plavix. Continue low-dose statin. No beta-blocker because of fatigue concerns, would be okay to use diltiazem however.  (Restarting diltiazem).      Relevant Medications   diltiazem (CARDIZEM CD) 120 MG 24 hr capsule   Other Relevant Orders   EKG 12-Lead   Hyperlipidemia LDL goal <70 (Chronic)    Seems to be doing okay with his low-dose rosuvastatin.  Should have had his labs just a few months ago.  I have do not have the results.  But target LDL should be less than 70.  He indicates to me that is less than 100 but not sure what it is.  Low threshold for potentially titrating up the dose versus adding Zetia.      Relevant Medications   diltiazem (CARDIZEM CD) 120 MG 24 hr capsule   Paroxysmal VT (HCC) (Chronic)    Intolerant of amiodarone, converted to mexiletine by Dr. Curt Bears. Unfortunately, we really do not have a good monitor option for him due to [place].  At this point, as far as I can tell he really has not  had that many spells of wide-complex tachycardia.  Question if perhaps we could try actually see how he does for short-term off of mexiletine in order to confirm or deny whether or not some of his symptoms are related to being on mexiletine. I have actually told to restart the diltiazem that he had previously stopped after 1 month.  This would potentially least help some of his heart rate episodes.  Question if the wide-complex tachycardia could potentially have been ischemic mediated and now that he is revascularized may not be as relevant.  However he did have that one episode  few weeks ago.      Relevant Medications   diltiazem (CARDIZEM CD) 120 MG 24 hr capsule   Other Relevant Orders   EKG 12-Lead   Presence of drug coated stent in LAD coronary artery (Chronic)    Now overlapping DES in LAD.  Okay to stop aspirin, but will continue clopidogrel. Would be okay to hold clopidogrel for procedures.  (5-7 days preop)         Current medicines are reviewed at length with the patient today. (+/- concerns) Concern for side effect from mexiletine The following changes have been made: See below  Patient Instructions  Medication Instructions:  MAY RESTART TAKING DILTIAZEM  120 MG ONE TABLET DAILY  STOP TAKING ASPIRIN   DISCUSS  WITH DR Baird Kay -ABOUT STOPPING MEXITIL  If you need a refill on your cardiac medications before your next appointment, please call your pharmacy.   Lab work: NOT NEEDED If you have labs (blood work) drawn today and your tests are completely normal, you will receive your results only by: [name] MyChart Message (if you have MyChart) OR . A paper copy in the mail If you have any lab test that is abnormal or we need to change your treatment, we will call you to review the results.  Testing/Procedures: NOT NEEDED  Follow-Up: At [facility], you and your health needs are our priority.  As part of our continuing mission to provide you with exceptional heart care, we have created designated Provider Care Teams.  These Care Teams include your primary Cardiologist (physician) and Advanced Practice Providers (APPs -  Physician Assistants and Nurse Practitioners) who all work together to provide you with the care you need, when you need it. You will need a follow up appointment in 6 months.  Please call our office 2 months in advance to schedule this appointment.  You may see [name], MD or one of the following Advanced Practice Providers on your designated Care Team:   Rosaria Ferries, PA-C . Jory Sims, DNP, ANP  Any Other  Special Instructions Will Be Listed Below (If Applicable).     Studies Ordered:   Orders Placed This Encounter  Procedures  . EKG 12-Lead      [name], M.D., M.S. Interventional Cardiologist   Pager # [phone] Phone # [phone] [address] [place], [place] [address]

## 2018-08-10 NOTE — Assessment & Plan Note (Signed)
Seems to be doing okay with his low-dose rosuvastatin.  Should have had his labs just a few months ago.  I have do not have the results.  But target LDL should be less than 70.  He indicates to me that is less than 100 but not sure what it is.  Low threshold for potentially titrating up the dose versus adding Zetia.

## 2018-08-10 NOTE — Assessment & Plan Note (Signed)
He had yet again another episode of near syncope this was more consistent with what he used to have.  I just do not know if this is truly related to an SVT or not.  It would be really nice to have a reliable way to monitor these episodes as well as his intermittent bouts of tachycardia. Unfortunately we have tried several monitored devices that have not been all that helpful.  In the past these have loop recorder was, but after over 6 years with 2 devices only one episode noted.  Question the benefit of mexiletine --will defer to Dr. Curt Bears, but perhaps we could try being off mexiletine and just on diltiazem.  He seems to be prone to having adverse symptoms with any medication we try

## 2018-08-10 NOTE — Assessment & Plan Note (Signed)
Now overlapping DES in LAD.  Okay to stop aspirin, but will continue clopidogrel. Would be okay to hold clopidogrel for procedures.  (5-7 days preop)

## 2018-08-10 NOTE — Assessment & Plan Note (Signed)
He truthfully never really had any anginal symptoms despite having significant LAD stenosis.  Not really even sure what his symptoms would be.  He said he felt better after his stent, but now says that he has not felt well since the stent, however he cannot say for sure what the reason is.  Plan: Stop aspirin, continue Plavix. Continue low-dose statin. No beta-blocker because of fatigue concerns, would be okay to use diltiazem however.  (Restarting diltiazem).

## 2018-08-10 NOTE — Patient Instructions (Addendum)
Medication Instructions:  MAY RESTART TAKING DILTIAZEM  120 MG ONE TABLET DAILY  STOP TAKING ASPIRIN   DISCUSS  WITH DR Baird Kay -ABOUT STOPPING MEXITIL  If you need a refill on your cardiac medications before your next appointment, please call your pharmacy.   Lab work: NOT NEEDED If you have labs (blood work) drawn today and your tests are completely normal, you will receive your results only by: [name] MyChart Message (if you have MyChart) OR . A paper copy in the mail If you have any lab test that is abnormal or we need to change your treatment, we will call you to review the results.  Testing/Procedures: NOT NEEDED  Follow-Up: At [facility], you and your health needs are our priority.  As part of our continuing mission to provide you with exceptional heart care, we have created designated Provider Care Teams.  These Care Teams include your primary Cardiologist (physician) and Advanced Practice Providers (APPs -  Physician Assistants and Nurse Practitioners) who all work together to provide you with the care you need, when you need it. You will need a follow up appointment in 6 months.  Please call our office 2 months in advance to schedule this appointment.  You may see [name], MD or one of the following Advanced Practice Providers on your designated Care Team:   Rosaria Ferries, PA-C . Jory Sims, DNP, ANP  Any Other Special Instructions Will Be Listed Below (If Applicable).

## 2018-09-04 ENCOUNTER — Other Ambulatory Visit: Payer: Self-pay | Admitting: Cardiology

## 2018-09-06 ENCOUNTER — Other Ambulatory Visit: Payer: Self-pay | Admitting: Cardiology

## 2018-09-06 MED ORDER — MEXILETINE HCL 200 MG PO CAPS: ORAL_CAPSULE | ORAL | 1 refills | Status: DC

## 2018-09-06 NOTE — Telephone Encounter (Signed)
Pt's medication was sent to pt's pharmacy as requested. Confirmation received.  °

## 2018-09-06 NOTE — Telephone Encounter (Signed)
Outpatient Medication Detail    Disp Refills Start End   mexiletine (MEXITIL) 200 MG capsule 180 capsule 1 09/06/2018    Sig: TAKE 1 CAPSULE(200 MG) BY MOUTH TWICE DAILY   Sent to pharmacy as: mexiletine (MEXITIL) 200 MG capsule   Notes to Pharmacy: **Patient requests 90 days supply**   E-Prescribing Status: Receipt confirmed by pharmacy (09/06/2018 1:35 PM EST)   Pharmacy   [id number] - [place], [place] - [address]

## 2018-10-17 ENCOUNTER — Ambulatory Visit (INDEPENDENT_AMBULATORY_CARE_PROVIDER_SITE_OTHER): Payer: Medicare Other | Admitting: Cardiology

## 2018-10-17 ENCOUNTER — Encounter: Payer: Self-pay | Admitting: Cardiology

## 2018-10-17 VITALS — BP 128/72 | HR 71 | Ht 71.0 in | Wt 196.0 lb

## 2018-10-17 DIAGNOSIS — E785 Hyperlipidemia, unspecified: Secondary | ICD-10-CM | POA: Diagnosis not present

## 2018-10-17 DIAGNOSIS — I472 Ventricular tachycardia, unspecified: Secondary | ICD-10-CM

## 2018-10-17 DIAGNOSIS — I2510 Atherosclerotic heart disease of native coronary artery without angina pectoris: Secondary | ICD-10-CM | POA: Diagnosis not present

## 2018-10-17 NOTE — Patient Instructions (Addendum)
Medication Instructions:  Your physician has recommended you make the following change in your medication:  1. HOLD Mexiletine for 5 days.  Call the office and let the nurse know how you did.  * If you need a refill on your cardiac medications before your next appointment, please call your pharmacy.   Labwork: None ordered  Testing/Procedures: None ordered  Follow-Up: Your physician recommends that you schedule a follow-up appointment in: 3 months with Dr. Curt Bears.   Thank you for choosing [facility]!!   Trinidad Curet, RN [phone]

## 2018-10-17 NOTE — Progress Notes (Signed)
Electrophysiology Office Note   Date:  10/17/2018   ID:  Alexander Armstrong, DOB [birth year], MRN [id number]  PCP:  [name], MD  Cardiologist:  [name] Primary Electrophysiologist:  Will [name], MD    No chief complaint on file.    History of Present Illness: Alexander Armstrong is a 78 y.o. male who presents today for electrophysiology evaluation.   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 During his evaluation for syncope, a Linq was inserted and a wide complex tachycardia was seen. He was started on flecainide 50 mg a day. EP study in 2014 showed a normal HV interval with no inducible arrhythmias at baseline or with 28 g/m of Isuprel. A nonsustained long RP atrial tachycardia was induced. Prior to starting flecainide, he complained of intermittent episodes of palpitations dizziness and lightheadedness. At times she also was presyncopal. The cycle length of the wide complex tachycardia was 220 msec. Flecainide has been stopped due to his coronary artery disease. He was started on amiodarone but did not tolerate this. Was started on Mexetiline.  He says that he has been feeling palpitations that occur mainly when he is up and exerting himself.    Today, denies symptoms of palpitations, chest pain, shortness of breath, orthopnea, PND, lower extremity edema, claudication, dizziness, presyncope, syncope, bleeding, or neurologic sequela. The patient is tolerating medications without difficulties.  He is continued to have episodes of weakness and fatigue.  He has not had any further episodes of palpitations.  He feels like he has always been running up to a half a mile even when he has been resting all day.  Past Medical History:  Diagnosis Date  . Allergy   . Arthritis   . Barrett esophagus 12/26/2007, 11/05/2010    . basal cell skin cancer   . CAD S/P percutaneous coronary angioplasty 11/03/2004   a. mLAD - Taxus DES 3.0 x 20; b. (11/06/04) staged PCI to RCA Taxus DES 3.5 x 16; c. (01/2017).  @ [facility] (Dr. [name]) Synergy DES 3.0 x 12 (for 99% lesion @ prox edge of old stent  . Cardiac syncope    No episode since starting flecainide. Had documented WIDE COMPLEX Tachycardia on loop recorder.  . Cataract bilateral cataracts  . Diverticulosis   . Esophageal stricture   . GERD (gastroesophageal reflux disease)   . Hiatal hernia 11/05/2010  . HLD (hyperlipidemia)   . Nonsustained paroxysmal ventricular tachycardia ([place]) 2014   Presumably controlled with flecainide. Unable to induce during EP study.  . Reflux gastritis   . STEMI involving left anterior descending coronary artery ([place]) 11/03/2004   [facility]: Occluded LAD treated with DES. Staged PCI to the RCA.   Past Surgical History:  Procedure Laterality Date  . CARDIAC CATHETERIZATION  ~2011   [facility]: re-look Cath - patent stents  . CORONARY STENT INTERVENTION N/A 01/27/2017   Procedure: Coronary Stent Intervention;  Surgeon: [name], MD;  Location: [facility]: mLAD (just after D1) overlapping prior DES -> SYNERGY DES 3X12 drug eluting stent  . ELECTROPHYSIOLOGIC STUDY  2014   Unable to stimulate VT seen on loop recorder  . implanted loop heart monitor x2  ~2011; 2014   Per report- batter out of date; apparently captured a prolonged episode of wide complex tachycardia associated  with an episode of weakness/near syncope  . INTRAVASCULAR PRESSURE WIRE/FFR STUDY N/A 01/27/2017   Procedure: Intravascular Pressure Wire/FFR Study;  Surgeon: [name], MD;  Location: [facility];  Service: Cardiovascular;  Laterality: N/A;  . LEFT HEART CATH AND CORONARY ANGIOGRAPHY N/A 01/27/2017   Procedure: Left Heart Cath and Coronary Angiography;  Surgeon: [name], MD;  Location: [facility] INVASIVE [facility]: mLAD 99% stenosis  pre-Stent & ISR --> PCI. Ost DI ~50%. pRCA DES ~50-60% FFR 0.86.  [name] NM MYOVIEW LTD  07/2014   Unremarkable EKG. No evidence of ischemia or infarct. Mild anterior attenuation, cannot exclude prior infarct, but nonreversible. EF 66%.  [name] NM MYOVIEW LTD  12/22/2016   INTERMEDIATE RISK. EF 58%. Defect 1: Medium size severe defect in the mid anterior, apical anterior and apical wall consistent with prior anterior MI. Defect to: Large defect and moderate severity in the basal inferoseptal basal inferior and inferoseptal mid inferior wall consistent with ischemia.  [name] PERCUTANEOUS CORONARY STENT INTERVENTION (PCI-S)  January-February 2006   a. mLAD - Taxus DES 3.0 x 20; b. (11/06/04) staged PCI to RCA Taxus DES 3.5 x 16; c. 01/27/2017: PCI to mLAD prox to old  stent - Synergy DES 3.0 x 12  . UMBILICAL HERNIA REPAIR       Current Outpatient Medications  Medication Sig Dispense Refill  . acetaminophen (TYLENOL) 500 MG tablet Take 1,000 mg by mouth 3 (three) times daily as needed for moderate pain or headache.    . Cholecalciferol (VITAMIN D3) 1000 units CAPS Take 1,000 Units by mouth at bedtime.    . clopidogrel (PLAVIX) 75 MG tablet TAKE 1 TABLET(75 MG) BY MOUTH DAILY 30 tablet 8  . diltiazem (CARDIZEM CD) 120 MG 24 hr capsule Take 1 capsule (120 mg total) by mouth daily. 90 capsule 3  . mexiletine (MEXITIL) 200 MG capsule TAKE 1 CAPSULE(200 MG) BY MOUTH TWICE DAILY 180 capsule 1  . oxybutynin (DITROPAN) 5 MG tablet Take 5 mg by mouth daily.    . pantoprazole (PROTONIX) 40 MG tablet Take 40 mg by mouth daily.    . rosuvastatin (CRESTOR) 20 MG tablet Take 20 mg by mouth daily.    . tamsulosin (FLOMAX) 0.4 MG CAPS capsule Take 0.4 mg by mouth daily after supper.      No current facility-administered medications for this visit.     Allergies:   Silver sulfadiazine; Adhesive [tape]; and Hydrocodone   Social History:  The patient  reports that he has never smoked. He has never used smokeless tobacco. He  reports that he does not drink alcohol or use drugs.   Family History:  The patient's family history includes Cancer in his father; Glaucoma in his paternal aunt; Heart attack in his brother; Heart disease in his mother; Macular degeneration in his father; Multiple myeloma in his mother; Prostate cancer in his father; Uterine cancer in his mother.   ROS:  Please see the history of present illness.   Otherwise, review of systems is positive for fatigue.   All other systems are reviewed and negative.   PHYSICAL EXAM: VS:  BP 128/72   Pulse 71   Ht 5' 11" (1.803 m)   Wt 196 lb (88.9 kg)   BMI 27.34 kg/m  , BMI Body mass index is 27.34 kg/m. GEN: Well nourished, well developed, in no acute distress  HEENT: normal  Neck: no JVD, carotid bruits, or masses Cardiac: RRR; no murmurs, rubs, or gallops,no edema  Respiratory:  clear to auscultation bilaterally, normal work of breathing GI: soft, nontender, nondistended, + BS MS: no deformity or atrophy  Skin: warm and dry Neuro:  Strength and sensation are intact Psych: euthymic mood, full affect  EKG:  EKG is ordered today. Personal review of the ekg ordered shows sinus rhythm, first-degree AV block, PVC  Recent Labs: No results found for requested labs within last 8760 hours.    Lipid Panel  No results found for: CHOL, TRIG, HDL, CHOLHDL, VLDL, LDLCALC, LDLDIRECT   Wt Readings from Last 3 Encounters:  10/17/18 196 lb (88.9 kg)  08/10/18 197 lb 6.4 oz (89.5 kg)  04/25/18 188 lb 12.8 oz (85.6 kg)      Other studies Reviewed: Additional studies/ records that were reviewed today include: SPECT 12/22/16  Review of the above records today demonstrates:   The left ventricular ejection fraction is normal (55-65%).  Nuclear stress EF: 58%.  Defect 1: There is a medium defect of severe severity present in the mid anterior, apical anterior and apex location consistent with a previous anterior apical MI  Defect 2: There is a large  defect of moderate severity present in the basal inferoseptal, basal inferior, mid inferoseptal, mid inferior and apical inferior location that is c/w inferior ischemia.  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  This is an intermediate risk study.   01/27/17 LHC  Mid LAD-1 lesion, 99 %stenosed - proximal to & involving the proximal portion of the previous stent.  A STENT SYNERGY DES 3X12 drug eluting stent was successfully placed, and overlaps previously placed stent.  Post intervention, there is a 0% residual stenosis.  _____________  Prox RCA-1 lesion, 55 %stenosed - In-stent Re-stenosis: FFR 0.86  The left ventricular ejection fraction is 50-55% by visual estimate.  Post intervention, there is a 0% residual stenosis.   Severe single-vessel disease severe stenosis in the LAD involving the proximal portion of the petrous the placed stent. This was treated with a single DES stent after angioplasty.  FFR negative lesion in the RCA.  I suspect that in the setting of the severe LAD lesion, this lesion was physiologically significant on the Myoview stress test. No longer significant following PCI of LAD.  ASSESSMENT AND PLAN:  1.  Coronary artery disease status post RCA and LAD interventions with stable angina: No current chest pain.  2.  Ventricular tachycardia: Only on mexiletine.  He is continued to have episodes of fatigue.  We Zarayah Lanting hold mexiletine to see if that makes a difference.  3. Hyperlipidemia: Continue Crestor  Current medicines are reviewed at length with the patient today.   The patient does not have concerns regarding his medicines.  The following changes were made today: None  Labs/ tests ordered today include:  Orders Placed This Encounter  Procedures  . EKG 12-Lead     Disposition:   FU with Tjay Velazquez 3 months  Signed, Deziree Mokry [name], MD  10/17/2018 3:47 PM     [address] [place] [address] [phone] (office) [phone] (fax)

## 2018-10-25 DIAGNOSIS — R000 Tachycardia, unspecified: Secondary | ICD-10-CM

## 2018-10-25 DIAGNOSIS — I495 Sick sinus syndrome: Secondary | ICD-10-CM

## 2018-10-25 DIAGNOSIS — I472 Ventricular tachycardia: Secondary | ICD-10-CM

## 2018-10-26 NOTE — Telephone Encounter (Signed)
Advised Dr. Curt Bears recommends a monitor for further clarification.  Pt feels that 48 hour monitor would be sufficient as he experiences the fluctuations almost daily. Pt aware office will call to schedule monitor

## 2018-11-14 ENCOUNTER — Ambulatory Visit (INDEPENDENT_AMBULATORY_CARE_PROVIDER_SITE_OTHER): Payer: Medicare Other

## 2018-11-14 DIAGNOSIS — I495 Sick sinus syndrome: Secondary | ICD-10-CM

## 2018-11-14 DIAGNOSIS — I472 Ventricular tachycardia: Secondary | ICD-10-CM

## 2018-11-14 DIAGNOSIS — R000 Tachycardia, unspecified: Secondary | ICD-10-CM

## 2018-12-04 HISTORY — PX: OTHER SURGICAL HISTORY: SHX169

## 2018-12-12 ENCOUNTER — Other Ambulatory Visit: Payer: Self-pay | Admitting: Cardiology

## 2018-12-24 ENCOUNTER — Other Ambulatory Visit: Payer: Self-pay | Admitting: Cardiology

## 2019-01-06 ENCOUNTER — Telehealth: Payer: Self-pay | Admitting: *Deleted

## 2019-01-06 NOTE — Telephone Encounter (Signed)
Called patient to let them know due to recent [place] and Health Department Protocols, we are not seeing patients in the office. We are instead seeing if they would like to schedule this appointment as a Research scientist (medical) or Laptop. Patient is aware if they decide to reschedule this appointment, they may not be seen or scheduled for the next 4-6 months. Patient is agreeable to WebEx. WebEx Virtual Visit Information sent [place].   Patient will call us back if they are having issues uploading the app. Patient was advised to have pencil/pen and paper ready to take notes before, during and after the Virtual visit, to have BP, HR, and recent Wt, and any other concerns and questions ready prior to start of appointment.  Patient advised appointment times are set like a regular schedule and will begin possibly 10 minutes before set time. Appointment is not a floating schedule for that day.  Patient will be doing the appointment from his office and office computer.  Please call the office number [phone], his cell phone does not work while in the office.

## 2019-01-17 ENCOUNTER — Telehealth (INDEPENDENT_AMBULATORY_CARE_PROVIDER_SITE_OTHER): Payer: Medicare Other | Admitting: Cardiology

## 2019-01-17 ENCOUNTER — Other Ambulatory Visit: Payer: Self-pay

## 2019-01-17 ENCOUNTER — Encounter: Payer: Self-pay | Admitting: Cardiology

## 2019-01-17 DIAGNOSIS — I472 Ventricular tachycardia: Secondary | ICD-10-CM | POA: Diagnosis not present

## 2019-01-17 DIAGNOSIS — I4729 Other ventricular tachycardia: Secondary | ICD-10-CM

## 2019-01-17 NOTE — Patient Instructions (Signed)
Medication Instructions:  Your physician recommends that you continue on your current medications as directed. Please refer to the Current Medication list given to you today.  If you need a refill on your cardiac medications before your next appointment, please call your pharmacy.   Labwork: None ordered  Testing/Procedures: None ordered  Follow-Up: Your physician wants you to follow-up in: 6 months with Dr. Camnitz.  You will receive a reminder letter in the mail two months in advance. If you don't receive a letter, please call our office to schedule the follow-up appointment.  Thank you for choosing [facility]!!   Charlyne Robertshaw, RN [phone]         

## 2019-01-17 NOTE — Progress Notes (Signed)
Electrophysiology TeleHealth Note   Due to national recommendations of social distancing due to COVID 19, an audio/video telehealth visit is felt to be most appropriate for this patient at this time.  See MyChart message from today for the patient's consent to telehealth for [facility].   Date:  01/17/2019   ID:  Alexander Armstrong, DOB 09/27/1941, MRN [id number]  Location: patient's home  Provider location: [address] [place]  Evaluation Performed: Follow-up visit  PCP:  [name], MD  Cardiologist:  [name], MD  Electrophysiologist:  Dr Curt Bears  Chief Complaint:  PVC, VT  History of Present Illness:    Alexander Armstrong is a 78 y.o. male who presents via audio/video conferencing for a telehealth visit today.  Since last being seen in our clinic, the patient reports doing very well.  Today, he denies symptoms of palpitations, chest pain, shortness of breath,  lower extremity edema, dizziness, presyncope, or syncope.  The patient is otherwise without complaint today.  The patient denies symptoms of fevers, chills, cough, or new SOB worrisome for COVID 19.  He has a history of STEMI status post angioplasty to the mid LAD in 2006 with staged intervention to the RCA in the same year, syncope, hyperlipidemia, and wide-complex tachycardia.  He is currently on mexiletine.  He was previously on flecainide but this was stopped due to his coronary artery disease.  He did not tolerate amiodarone.  He has been taken off of his mexiletine.  Since stopping the mexiletine, he has had less episodes of severe fatigue and weakness.  Today, denies symptoms of palpitations, chest pain, shortness of breath, orthopnea, PND, lower extremity edema, claudication, dizziness, presyncope, syncope, bleeding, or neurologic sequela. The patient is tolerating medications without difficulties.    Past Medical History:  Diagnosis Date  . Allergy   . Arthritis   . Barrett esophagus  12/26/2007, 11/05/2010  . basal cell skin cancer   . CAD S/P percutaneous coronary angioplasty 11/03/2004   a. mLAD - Taxus DES 3.0 x 20; b. (11/06/04) staged PCI to RCA Taxus DES 3.5 x 16; c. (01/2017).  @ [facility] (Dr. [name]) Synergy DES 3.0 x 12 (for 99% lesion @ prox edge of old stent  . Cardiac syncope    No episode since starting flecainide. Had documented WIDE COMPLEX Tachycardia on loop recorder.  . Cataract bilateral cataracts  . Diverticulosis   . Esophageal stricture   . GERD (gastroesophageal reflux disease)   . Hiatal hernia 11/05/2010  . HLD (hyperlipidemia)   . Nonsustained paroxysmal ventricular tachycardia ([place]) 2014   Presumably controlled with flecainide. Unable to induce during EP study.  . Reflux gastritis   . STEMI involving left anterior descending coronary artery ([place]) 11/03/2004   [facility]: Occluded LAD treated with DES. Staged PCI to the RCA.    Past Surgical History:  Procedure Laterality Date  . CARDIAC CATHETERIZATION  ~2011   [facility]: re-look Cath - patent stents  . CORONARY STENT INTERVENTION N/A 01/27/2017   Procedure: Coronary Stent Intervention;  Surgeon: [name], MD;  Location: [facility]: mLAD (just after D1) overlapping prior DES -> SYNERGY DES 3X12 drug eluting stent  . ELECTROPHYSIOLOGIC STUDY  2014   Unable to stimulate VT seen on loop recorder  . implanted loop heart monitor x2  ~2011; 2014   Per report- batter out of date; apparently captured a prolonged episode of wide complex tachycardia associated with an episode of  weakness/near syncope  . INTRAVASCULAR PRESSURE WIRE/FFR STUDY N/A 01/27/2017   Procedure: Intravascular Pressure Wire/FFR Study;  Surgeon: [name], MD;  Location: [facility];  Service: Cardiovascular;  Laterality: N/A;  . LEFT HEART CATH AND CORONARY ANGIOGRAPHY N/A 01/27/2017   Procedure: Left Heart Cath and Coronary Angiography;  Surgeon: [name], MD;  Location: [facility] INVASIVE CV LAB:  mLAD 99% stenosis pre-Stent & ISR --> PCI. Ost DI ~50%. pRCA DES ~50-60% FFR 0.86.  [name] NM MYOVIEW LTD  07/2014   Unremarkable EKG. No evidence of ischemia or infarct. Mild anterior attenuation, cannot exclude prior infarct, but nonreversible. EF 66%.  [name] NM MYOVIEW LTD  12/22/2016   INTERMEDIATE RISK. EF 58%. Defect 1: Medium size severe defect in the mid anterior, apical anterior and apical wall consistent with prior anterior MI. Defect to: Large defect and moderate severity in the basal inferoseptal basal inferior and inferoseptal mid inferior wall consistent with ischemia.  [name] PERCUTANEOUS CORONARY STENT INTERVENTION (PCI-S)  January-February 2006   a. mLAD - Taxus DES 3.0 x 20; b. (11/06/04) staged PCI to RCA Taxus DES 3.5 x 16; c. 01/27/2017: PCI to mLAD prox to old  stent - Synergy DES 3.0 x 12  . UMBILICAL HERNIA REPAIR      Current Outpatient Medications  Medication Sig Dispense Refill  . acetaminophen (TYLENOL) 500 MG tablet Take 1,000 mg by mouth 3 (three) times daily as needed for moderate pain or headache.    . Cholecalciferol (VITAMIN D3) 1000 units CAPS Take 1,000 Units by mouth at bedtime.    . clopidogrel (PLAVIX) 75 MG tablet TAKE 1 TABLET(75 MG) BY MOUTH DAILY 30 tablet 8  . oxybutynin (DITROPAN) 5 MG tablet Take 5 mg by mouth daily.    . pantoprazole (PROTONIX) 40 MG tablet Take 40 mg by mouth daily.    . rosuvastatin (CRESTOR) 20 MG tablet Take 20 mg by mouth daily.    . tamsulosin (FLOMAX) 0.4 MG CAPS capsule Take 0.4 mg by mouth daily after supper.     . diltiazem (CARDIZEM CD) 120 MG 24 hr capsule Take 1 capsule (120 mg total) by mouth daily. 90 capsule 3  . mexiletine (MEXITIL) 200 MG capsule TAKE ONE CAPSULE BY MOUTH TWICE DAILY (Patient not taking: Reported on 01/17/2019) 180 capsule 1   No current facility-administered medications for this visit.     Allergies:   Silver sulfadiazine; Adhesive [tape]; and Hydrocodone   Social History:  The patient  reports that he has  never smoked. He has never used smokeless tobacco. He reports that he does not drink alcohol or use drugs.   Family History:  The patient's  family history includes Cancer in his father; Glaucoma in his paternal aunt; Heart attack in his brother; Heart disease in his mother; Macular degeneration in his father; Multiple myeloma in his mother; Prostate cancer in his father; Uterine cancer in his mother.   ROS:  Please see the history of present illness.   All other systems are personally reviewed and negative.    Exam:    Vital Signs:  BP 122/63   Pulse 78   Wt 200 lb (90.7 kg)   BMI 27.89 kg/m   Well appearing, alert and conversant, regular work of breathing,  good skin color Eyes- anicteric, neuro- grossly intact, skin- no apparent rash or lesions or cyanosis, mouth- oral mucosa is pink   Labs/Other Tests and Data Reviewed:    Recent Labs: No results found for  requested labs within last 8760 hours.   Wt Readings from Last 3 Encounters:  01/17/19 200 lb (90.7 kg)  10/17/18 196 lb (88.9 kg)  08/10/18 197 lb 6.4 oz (89.5 kg)     Other studies personally reviewed: Additional studies/ records that were reviewed today include: 10/17/18  Review of the above records today demonstrates:  SR, PVC, 1dAVB    ASSESSMENT & PLAN:    1.  Coronary artery disease: Status post RCA and LAD interventions.  Current chest pain.  Continue current management.  2.  Ventricular tachycardia: His mexiletine was stopped.  He has had much less symptoms of weakness and fatigue.  We Tashica Provencio continue to hold mexiletine unless he has further episodes of tachycardia.  3.  Hyperlipidemia: Continue Crestor   COVID 19 screen The patient denies symptoms of COVID 19 at this time.  The importance of social distancing was discussed today.  Follow-up: 6 months   Current medicines are reviewed at length with the patient today.   The patient does not have concerns regarding his medicines.  The following changes  were made today:  none  Labs/ tests ordered today include:  No orders of the defined types were placed in this encounter.    Patient Risk:  after full review of this patients clinical status, I feel that they are at moderate risk at this time.  Today, I have spent 12 minutes with the patient with telehealth technology discussing PVCs.    Signed, Jensyn Cambria [name], MD  01/17/2019 8:23 AM     [facility] [address] [place] [address] [phone] (office) [phone] (fax)

## 2019-01-31 ENCOUNTER — Telehealth: Payer: Self-pay | Admitting: *Deleted

## 2019-01-31 NOTE — Telephone Encounter (Signed)
01/31/19 LMOM @ 10:02 am,re: follow appointment.

## 2019-02-15 ENCOUNTER — Telehealth: Payer: Self-pay | Admitting: *Deleted

## 2019-02-15 NOTE — Telephone Encounter (Signed)
   Primary Cardiologist: [name], MD   Pt contacted.  History and symptoms reviewed.  Pt will f/u with [facility] provider as scheduled.  Pt. advised that we are restricting visitors at this time and request that only patients present for check-in prior to their appointment.  All other visitors should remain in their car.  If necessary, only one visitor may come with the patient, into the building. For everyone's safety, all patients and visitor entering our practice area should expect to be screened again prior to entering our waiting area. Patient aware to wear face covering while in office.  [name], RN  02/15/2019 2:00 PM       COVID-19 Pre-Screening Questions:  . In the past 7 to 10 days have you had a cough,  shortness of breath, headache, congestion, fever, body aches, chills, sore throat, or sudden loss of taste or sense of smell?no . Have you been around anyone with known Covid 19. No  . Have you been around anyone who is awaiting Covid 19 test results in the past 7 to 10 days?no  . Have you been around anyone who has been exposed to Covid 19, or has mentioned symptoms of Covid 19 within the past 7 to 10 days? No   If you have any concerns about symptoms your patients report please contact your leadership team, or the provider the patient is seeing in the office for further guidance.

## 2019-02-20 ENCOUNTER — Ambulatory Visit (INDEPENDENT_AMBULATORY_CARE_PROVIDER_SITE_OTHER): Payer: Medicare Other | Admitting: Cardiology

## 2019-02-20 ENCOUNTER — Other Ambulatory Visit: Payer: Self-pay

## 2019-02-20 VITALS — HR 69 | Temp 99.5°F | Ht 71.0 in | Wt 202.4 lb

## 2019-02-20 DIAGNOSIS — R55 Syncope and collapse: Secondary | ICD-10-CM | POA: Diagnosis not present

## 2019-02-20 DIAGNOSIS — Z5181 Encounter for therapeutic drug level monitoring: Secondary | ICD-10-CM

## 2019-02-20 DIAGNOSIS — I472 Ventricular tachycardia, unspecified: Secondary | ICD-10-CM

## 2019-02-20 DIAGNOSIS — I2510 Atherosclerotic heart disease of native coronary artery without angina pectoris: Secondary | ICD-10-CM

## 2019-02-20 DIAGNOSIS — R002 Palpitations: Secondary | ICD-10-CM

## 2019-02-20 DIAGNOSIS — I951 Orthostatic hypotension: Secondary | ICD-10-CM | POA: Diagnosis not present

## 2019-02-20 DIAGNOSIS — Z955 Presence of coronary angioplasty implant and graft: Secondary | ICD-10-CM

## 2019-02-20 DIAGNOSIS — E785 Hyperlipidemia, unspecified: Secondary | ICD-10-CM | POA: Diagnosis not present

## 2019-02-20 DIAGNOSIS — I4729 Other ventricular tachycardia: Secondary | ICD-10-CM

## 2019-02-20 DIAGNOSIS — R6889 Other general symptoms and signs: Secondary | ICD-10-CM

## 2019-02-20 NOTE — Progress Notes (Signed)
PCP: [name], MD  Clinic Note: Chief Complaint  Patient presents with  . Follow-up    37-month . Coronary Artery Disease    Fatigue and dyspnea with exertion  . Palpitations    HPI: Alexander Magowanis a 78y.o. male with a PMH below who presents today for CAD-PCI as well as history of syncopal episodes with wide-complex tachycardia.. He had angioplasty to the LAD in the setting of STEMI back in 2006 followed by staged PCI to the RCA.  The wide-complex tachycardia was found as part of evaluation for syncope at the end of life of the a second loop recorder that was placed.  Initially he was managed with flecainide, this was stopped due to his CAD, and was converted initially to amiodarone which she did not tolerate.  He is subsequently been on mexiletine which he also stopped because of fatigue and weakness.   CLyriq Jarchowwas last seen by me in November 2019  He was also subsequently seen by Dr. CCurt Bearson January 17, 2019 for a telehealth visit.  Was feeling better after stopping mexiletine.  Recent Hospitalizations: No recent hospitalizations  Studies Personally Reviewed - (if available, images/films reviewed: From Epic Chart or Care Everywhere)  Holter monitor March 2020: Heart rate ranged from 47 to 114 bpm.  Rare PACs and PVCs.  Sinus rhythm noted no A. fib or other arrhythmia.  Interval History: I am seeing Alexander Armstrong for follow-up.  He says that the weird heart palpitations and dizziness type symptoms have definitely improved since stopping mexiletine.  What he really notes now is that he just gets tired very quickly with any activity.  He feels okay initially, but has to stop after doing any notable amount of activity.  He really does not notice having shortness of breath as much is just feeling tired.  If he pushes it too much he will feel short of breath, but has not had any chest pain or pressure.   He denies any resting dyspnea or chest pain/pressure. Still has  heart racing spells, but much less frequent and not associated with any syncope or near syncope.  No TIA or amaurosis fugax. He does have some mild orthostatic symptoms in the morning when he first gets up, but not at baseline. No PND, orthopnea or edema symptoms. No melena, hematochezia, hematuria, or epstaxis. No claudication.  ROS: A comprehensive was performed. Review of Systems  Constitutional: Positive for malaise/fatigue (easily fatiged).  HENT: Positive for congestion (allergies).   Respiratory: Positive for cough (related to allergies - chronic).   Gastrointestinal: Negative for blood in stool.  Genitourinary: Negative for hematuria.  Musculoskeletal: Negative for falls (close - but no true fall).  Neurological: Positive for dizziness (balance). Negative for loss of consciousness.  Endo/Heme/Allergies: Positive for environmental allergies.  Psychiatric/Behavioral: Negative for memory loss. The patient is not nervous/anxious and does not have insomnia.   All other systems reviewed and are negative.   The patient does not have symptoms concerning for COVID-19 infection (fever, chills, cough, or new shortness of breath).  The patient is practicing social distancing.   COVID-19 Education: The signs and symptoms of COVID-19 were discussed with the patient and how to seek care for testing (follow up with PCP or arrange E-visit).   The importance of social distancing was discussed today.   I have reviewed and (if needed) personally updated the patient's problem list, medications, allergies, past medical and surgical history, social and family history.  Past Medical History:  Diagnosis Date  . Allergy   . Arthritis   . 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 basal cell skin cancer   . CAD S/P percutaneous coronary angioplasty 11/03/2004   a. mLAD - Taxus DES 3.0 x 20; b. (11/06/04) staged PCI to RCA Taxus DES 3.5 x 16; c. (01/2017).  @ [facility] (Dr. [name]) Synergy DES 3.0 x 12 (for  99% lesion @ prox edge of old stent  . Cardiac syncope    No episode since starting flecainide. Had documented WIDE COMPLEX Tachycardia on loop recorder.  . Cataract bilateral cataracts  . Diverticulosis   . Esophageal stricture   . GERD (gastroesophageal reflux disease)   . Hiatal hernia 11/05/2010  . HLD (hyperlipidemia)   . 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 Unable to induce during EP study.  . Reflux gastritis   . STEMI involving left anterior descending coronary artery ([place]) 11/03/2004   [facility]: Occluded LAD treated with DES. Staged PCI to the RCA.    CAD Hx:   Anterior STEMI status post PCI to the LAD followed by staged PCI to the RCA in 2006.  --> mLAD - Taxus DES 3.0 x 20; b. (11/06/04) staged PCI to RCA Taxus DES 3.5 x 16  WCT noted on ILR --> started on Flecainide (obviously stopped in 2018 following LAD PCI --> intolerant of amiodarone and  Mexiletine (Dr. Curt Armstrong); maintained on diltiazem  Myoview - 12/2016 - done with initial New Patient assessment.  INTERMEDIATE RISK. EF 58%. Defect 1: Medium size severe defect in the mid anterior, apical anterior and apical wall consistent with prior anterior MI. Defect 2: Large defect and moderate severity in the basal inferoseptal basal inferior and inferoseptal mid inferior wall consistent with ischemia.  (01/2017).  @ [facility] (Dr. [name]) CATH-PCI LAD: Synergy DES 3.0 x 12 (for 99% lesion @ prox edge of old stent   Referred to by Dr. Curt Armstrong (EP) 04/25/2017 - started Diltiazem, continued Mexilitine. - no more VT.  Still palpitations. -Now no longer on mexiletine & on lower dose diltiazem  Past Surgical History:  Procedure Laterality Date  . CARDIAC CATHETERIZATION  ~2011   [facility]: re-look Cath - patent stents  . CORONARY STENT INTERVENTION N/A 01/27/2017   Procedure: Coronary Stent Intervention;  Surgeon: [name], MD;  Location: [facility]: mLAD (just after D1) overlapping prior DES -> SYNERGY DES 3X12 drug eluting stent  . ELECTROPHYSIOLOGIC STUDY  2014   Unable to stimulate VT seen on loop recorder  . implanted loop heart monitor x2  ~2011; 2014   Per report- batter out of date; apparently captured a prolonged episode of wide complex tachycardia associated with an episode of weakness/near syncope  . INTRAVASCULAR PRESSURE WIRE/FFR STUDY N/A 01/27/2017   Procedure: Intravascular Pressure Wire/FFR Study;  Surgeon: [name], MD;  Location: [facility];  Service: Cardiovascular;  Laterality: N/A;  . LEFT HEART CATH AND CORONARY ANGIOGRAPHY N/A 01/27/2017   Procedure: Left Heart Cath and Coronary Angiography;  Surgeon: [name], MD;  Location: [facility]VASIVE CV LAB: mLAD 99% stenosis pre-Stent & ISR --> PCI. Ost DI ~50%. pRCA DES ~50-60% FFR 0.86.  [name] NM MYOVIEW LTD  07/2014   Unremarkable EKG. No evidence of ischemia or infarct. Mild anterior attenuation, cannot exclude prior infarct, but nonreversible. EF 66%.  [name] NM MYOVIEW LTD  12/22/2016   INTERMEDIATE RISK. EF 58%. Defect 1:  Medium size severe defect in the mid anterior, apical anterior and apical wall consistent with prior anterior MI. Defect to: Large defect and moderate severity in the basal inferoseptal basal inferior and inferoseptal mid inferior wall consistent with ischemia.  [name] PERCUTANEOUS CORONARY STENT INTERVENTION (PCI-S)  January-February 2006   a. mLAD - Taxus DES 3.0 x 20; b. (11/06/04) staged PCI to RCA Taxus DES 3.5 x 16; c. 01/27/2017: PCI to mLAD prox to old  stent - Synergy DES 3.0 x 12  . UMBILICAL HERNIA REPAIR      Current Meds  Medication Sig  . acetaminophen (TYLENOL) 500 MG tablet Take 1,000 mg by mouth 3 (three) times daily as needed for moderate pain or headache.  . Cholecalciferol (VITAMIN D3) 1000 units CAPS Take 1,000 Units by mouth at bedtime.  . clopidogrel (PLAVIX) 75 MG tablet TAKE 1 TABLET(75 MG) BY MOUTH DAILY  .  diltiazem (CARDIZEM CD) 120 MG 24 hr capsule Take 1 capsule (120 mg total) by mouth daily.  [name] oxybutynin (DITROPAN) 5 MG tablet Take 5 mg by mouth daily.  . pantoprazole (PROTONIX) 40 MG tablet Take 40 mg by mouth daily.  . rosuvastatin (CRESTOR) 20 MG tablet Take 20 mg by mouth daily.  . tamsulosin (FLOMAX) 0.4 MG CAPS capsule Take 0.4 mg by mouth daily after supper.     Allergies  Allergen Reactions  . Silver Sulfadiazine Hives and Rash  . Adhesive [Tape] Rash  . Hydrocodone Rash    Social History   Tobacco Use  . Smoking status: Never Smoker  . Smokeless tobacco: Never Used  Substance Use Topics  . Alcohol use: No  . Drug use: No   Social History   Social History Narrative  . Not on file    family history includes Cancer in his father; Glaucoma in his paternal aunt; Heart attack in his brother; Heart disease in his mother; Macular degeneration in his father; Multiple myeloma in his mother; Prostate cancer in his father; Uterine cancer in his mother.  Wt Readings from Last 3 Encounters:  02/20/19 202 lb 6.4 oz (91.8 kg)  01/17/19 200 lb (90.7 kg)  10/17/18 196 lb (88.9 kg)    PHYSICAL EXAM Pulse 69   Temp 99.5 F (37.5 C)   Ht _0  (1.803 m)   Wt 202 lb 6.4 oz (91.8 kg)   SpO2 95%   BMI 28.23 kg/m  Physical Exam  Constitutional: He is oriented to person, place, and time. He appears well-developed and well-nourished. No distress.  Well-groomed.  Healthy-appearing  HENT:  Head: Normocephalic and atraumatic.  Wearing mask  Neck: Normal range of motion. Neck supple. No hepatojugular reflux and no JVD present. Carotid bruit is not present.  Cardiovascular: Normal rate, regular rhythm, normal heart sounds and intact distal pulses.  No extrasystoles are present. PMI is not displaced. Exam reveals no gallop and no friction rub.  No murmur heard. Pulmonary/Chest: Effort normal and breath sounds normal. No respiratory distress. He has no wheezes. He has no rales.   Abdominal: Soft. Bowel sounds are normal. He exhibits no distension. There is abdominal tenderness (Mild tenderness to palpation). There is no rebound.  Mildly rotund/obese abdomen  Musculoskeletal: Normal range of motion.        General: No edema.  Neurological: He is alert and oriented to person, place, and time. No cranial nerve deficit.  Psychiatric: He has a normal mood and affect. His behavior is normal. Judgment and thought content normal.  Vitals reviewed.  Adult ECG Report  none  Other studies Reviewed: Additional studies/ records that were reviewed today include:  Recent Labs:   Lab Results  Component Value Date   CREATININE 1.06 01/27/2017   BUN 18 01/27/2017   NA 137 01/27/2017   K 4.0 01/27/2017   CL 106 01/27/2017   CO2 23 01/27/2017   No results found for: CHOL, HDL, LDLCALC, LDLDIRECT, TRIG, CHOLHDL  -Lipids from June 2019: LDL 69.  TC 113, TG 90, HDL 34   ASSESSMENT / PLAN: Problem List Items Addressed This Visit    Cardiac syncope - Primary (Chronic)    No further episodes now.  Tachycardia spells seem to be stabilized now.  No longer on mexiletine. He is on low-dose diltiazem, feeling better with reduced dose. At this point I think I would prefer to avoid any adjustment.  We will defer replacement of loop recorder to Dr. Curt Armstrong      Relevant Orders   MYOCARDIAL PERFUSION IMAGING   Coronary artery disease involving native coronary artery of native heart without angina pectoris (Chronic)    He never really had anginal symptoms.  His last stress test showed pretty significant anterior ischemia and cath showed 99% LAD disease.  Despite that he did not have angina.  He actually felt fine.  Interestingly he says he feels better prior to his stent than he does now. I am worried about his fatigue.  Some of this can be related to statin, but that was actually recently started and his symptoms have been before that.  I do not think that the low-dose diltiazem made  a difference. We did stop beta-blocker however.  With normal EF we are using diltiazem in place of beta-blocker. He is on Plavix without aspirin and is on stable dose statin.  Plan:   No change medications  For possible anginal equivalent of exertional fatigue and dyspnea we will check Lexiscan Myoview      Relevant Orders   MYOCARDIAL PERFUSION IMAGING   Exercise intolerance    I am concerned his exercise intolerance may be his anginal equivalent.  He never really had true angina. Plan: Check Lexiscan Myoview.      Relevant Orders   MYOCARDIAL PERFUSION IMAGING   Hyperlipidemia LDL goal <70 (Chronic)    PCP has him on low-dose rosuvastatin.   His last labs were actually from June of last year, and LDL was less than 70.  I will go ahead and reorder chemistry and lipid panel since he has not been back to his PCP in a while.  Would be due for check soon anyway.      Relevant Orders   Lipid panel   Comprehensive metabolic panel   Hypotension (Chronic)    No longer having issues with hypotension.  No longer on midodrine.      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  Continue low-dose diltiazem.  No longer on mexiletine.      Presence of drug coated stent in LAD coronary artery (Chronic)    With overlapping stents in the LAD, is on long-term Plavix.  But okay to stop for procedures.       Other Visit Diagnoses    Therapeutic drug monitoring       Relevant Orders   Lipid panel   Comprehensive metabolic panel      I spent a total of 26 minutes with  the patient and chart review. >  50% of the time was spent in direct patient consultation.   Current medicines are reviewed at length with the patient today.  (+/- concerns) none The following changes have been made:  None  Patient Instructions  Medication Instructions:  Your physician recommends that you continue on your current  medications as directed. Please refer to the Current Medication list given to you today.  If you need a refill on your cardiac medications before your next appointment, please call your pharmacy.   Lab work: FASTING LP/CMET AT YOUR PCP SOON  If you have labs (blood work) drawn today and your tests are completely normal, you will receive your results only by: [name] MyChart Message (if you have MyChart) OR . A paper copy in the mail If you have any lab test that is abnormal or we need to change your treatment, we will call you to review the results.  Testing/Procedures: Your physician has requested that you have a lexiscan myoview. For further information please visit [email or web address]. Please follow instruction sheet, as given. AFTER COVID RESTRICTIONS LIFTED  Follow-Up: At [facility], you and your health needs are our priority.  As part of our continuing mission to provide you with exceptional heart care, we have created designated Provider Care Teams.  These Care Teams include your primary Cardiologist (physician) and Advanced Practice Providers (APPs -  Physician Assistants and Nurse Practitioners) who all work together to provide you with the care you need, when you need it. You will need a follow up appointment in 6-8 months.  Please call our office 2 months in advance to schedule this appointment.  You may see Alexander Hew, MD or one of the following Advanced Practice Providers on your designated Care Team:   Rosaria Ferries, PA-C . Jory Sims, DNP, ANP  Any Other Special Instructions Will Be Listed Below (If Applicable).   Studies Ordered:   Orders Placed This Encounter  Procedures  . Lipid panel  . Comprehensive metabolic panel  . MYOCARDIAL PERFUSION IMAGING      Alexander Armstrong, M.D., M.S. Interventional Cardiologist   Pager # [phone] Phone # [phone] [address] [place], [place] [address]   Thank you for choosing Heartcare at  [facility]!!

## 2019-02-20 NOTE — Patient Instructions (Signed)
Medication Instructions:  Your physician recommends that you continue on your current medications as directed. Please refer to the Current Medication list given to you today.  If you need a refill on your cardiac medications before your next appointment, please call your pharmacy.   Lab work: FASTING LP/CMET AT YOUR PCP SOON  If you have labs (blood work) drawn today and your tests are completely normal, you will receive your results only by: [name] MyChart Message (if you have MyChart) OR . A paper copy in the mail If you have any lab test that is abnormal or we need to change your treatment, we will call you to review the results.  Testing/Procedures: Your physician has requested that you have a lexiscan myoview. For further information please visit [email or web address]. Please follow instruction sheet, as given. AFTER COVID RESTRICTIONS LIFTED  Follow-Up: At [facility], you and your health needs are our priority.  As part of our continuing mission to provide you with exceptional heart care, we have created designated Provider Care Teams.  These Care Teams include your primary Cardiologist (physician) and Advanced Practice Providers (APPs -  Physician Assistants and Nurse Practitioners) who all work together to provide you with the care you need, when you need it. You will need a follow up appointment in 6-8 months.  Please call our office 2 months in advance to schedule this appointment.  You may see [name], MD or one of the following Advanced Practice Providers on your designated Care Team:   Rosaria Ferries, PA-C . Jory Sims, DNP, ANP  Any Other Special Instructions Will Be Listed Below (If Applicable).   Cardiac Nuclear Scan A cardiac nuclear scan is a test that is done to check the flow of blood to your heart. It is done when you are resting and when you are exercising. The test looks for problems such as:  Not enough blood reaching a portion of the heart.  The heart  muscle not working as it should. You may need this test if:  You have heart disease.  You have had lab results that are not normal.  You have had heart surgery or a balloon procedure to open up blocked arteries (angioplasty).  You have chest pain.  You have shortness of breath. In this test, a special dye (tracer) is put into your bloodstream. The tracer will travel to your heart. A camera will then take pictures of your heart to see how the tracer moves through your heart. This test is usually done at a hospital and takes 2-4 hours. Tell a doctor about:  Any allergies you have.  All medicines you are taking, including vitamins, herbs, eye drops, creams, and over-the-counter medicines.  Any problems you or family members have had with anesthetic medicines.  Any blood disorders you have.  Any surgeries you have had.  Any medical conditions you have.  Whether you are pregnant or may be pregnant. What are the risks? Generally, this is a safe test. However, problems may occur, such as:  Serious chest pain and heart attack. This is only a risk if the stress portion of the test is done.  Rapid heartbeat.  A feeling of warmth in your chest. This feeling usually does not last long.  Allergic reaction to the tracer. What happens before the test?  Ask your doctor about changing or stopping your normal medicines. This is important.  Follow instructions from your doctor about what you cannot eat or drink.  Remove your jewelry on the  day of the test. What happens during the test?  An IV tube will be inserted into one of your veins.  Your doctor will give you a small amount of tracer through the IV tube.  You will wait for 20-40 minutes while the tracer moves through your bloodstream.  Your heart will be monitored with an electrocardiogram (ECG).  You will lie down on an exam table.  Pictures of your heart will be taken for about 15-20 minutes.  You may also have a stress  test. For this test, one of these things may be done: ? You will be asked to exercise on a treadmill or a stationary bike. ? You will be given medicines that will make your heart work harder. This is done if you are unable to exercise.  When blood flow to your heart has peaked, a tracer will again be given through the IV tube.  After 20-40 minutes, you will get back on the exam table. More pictures will be taken of your heart.  Depending on the tracer that is used, more pictures may need to be taken 3-4 hours later.  Your IV tube will be removed when the test is over. The test may vary among doctors and hospitals. What happens after the test?  Ask your doctor: ? Whether you can return to your normal schedule, including diet, activities, and medicines. ? Whether you should drink more fluids. This will help to remove the tracer from your body. Drink enough fluid to keep your pee (urine) pale yellow.  Ask your doctor, or the department that is doing the test: ? When will my results be ready? ? How will I get my results? Summary  A cardiac nuclear scan is a test that is done to check the flow of blood to your heart.  Tell your doctor whether you are pregnant or may be pregnant.  Before the test, ask your doctor about changing or stopping your normal medicines. This is important.  Ask your doctor whether you can return to your normal activities. You may be asked to drink more fluids. This information is not intended to replace advice given to you by your health care provider. Make sure you discuss any questions you have with your health care provider. Document Released: 03/07/2018 Document Revised: 03/07/2018 Document Reviewed: 03/07/2018 Elsevier Interactive Patient Education  2019 [employer].

## 2019-02-21 ENCOUNTER — Encounter: Payer: Self-pay | Admitting: Cardiology

## 2019-02-21 NOTE — Assessment & Plan Note (Signed)
PCP has him on low-dose rosuvastatin.   His last labs were actually from June of last year, and LDL was less than 70.  I will go ahead and reorder chemistry and lipid panel since he has not been back to his PCP in a while.  Would be due for check soon anyway.

## 2019-02-21 NOTE — Assessment & Plan Note (Signed)
No longer having issues with hypotension.  No longer on midodrine.

## 2019-02-21 NOTE — Assessment & Plan Note (Signed)
With overlapping stents in the LAD, is on long-term Plavix.  But okay to stop for procedures.

## 2019-02-21 NOTE — Assessment & Plan Note (Signed)
No further episodes now.  Tachycardia spells seem to be stabilized now.  No longer on mexiletine. He is on low-dose diltiazem, feeling better with reduced dose. At this point I think I would prefer to avoid any adjustment.  We will defer replacement of loop recorder to Dr. Curt Bears

## 2019-02-21 NOTE — Assessment & Plan Note (Signed)
I am concerned his exercise intolerance may be his anginal equivalent.  He never really had true angina. Plan: Check Lexiscan Myoview.

## 2019-02-21 NOTE — Assessment & Plan Note (Signed)
He never really had anginal symptoms.  His last stress test showed pretty significant anterior ischemia and cath showed 99% LAD disease.  Despite that he did not have angina.  He actually felt fine.  Interestingly he says he feels better prior to his stent than he does now. I am worried about his fatigue.  Some of this can be related to statin, but that was actually recently started and his symptoms have been before that.  I do not think that the low-dose diltiazem made a difference. We did stop beta-blocker however.  With normal EF we are using diltiazem in place of beta-blocker. He is on Plavix without aspirin and is on stable dose statin.  Plan:   No change medications  For possible anginal equivalent of exertional fatigue and dyspnea we will check Lexiscan Myoview

## 2019-02-21 NOTE — Assessment & Plan Note (Signed)
No further issues at this time.  Continue low-dose diltiazem.  No longer on mexiletine.

## 2019-02-22 ENCOUNTER — Telehealth: Payer: Self-pay | Admitting: Cardiology

## 2019-02-22 NOTE — Telephone Encounter (Signed)
Forward to scheduler - for  [employer]

## 2019-02-22 NOTE — Telephone Encounter (Signed)
Follow Up:; ° ° °Returning your call. °

## 2019-04-03 ENCOUNTER — Telehealth (HOSPITAL_COMMUNITY): Payer: Self-pay

## 2019-04-03 NOTE — Telephone Encounter (Signed)
Spoke with the patient's wife and left instructions with here for his stress test. Asked to call back with any questions. S.Kate Larock EMTP

## 2019-04-05 ENCOUNTER — Other Ambulatory Visit: Payer: Self-pay

## 2019-04-05 ENCOUNTER — Ambulatory Visit (HOSPITAL_COMMUNITY): Payer: Medicare Other | Attending: Cardiology

## 2019-04-05 DIAGNOSIS — I2510 Atherosclerotic heart disease of native coronary artery without angina pectoris: Secondary | ICD-10-CM | POA: Insufficient documentation

## 2019-04-05 DIAGNOSIS — R6889 Other general symptoms and signs: Secondary | ICD-10-CM | POA: Diagnosis not present

## 2019-04-05 DIAGNOSIS — R002 Palpitations: Secondary | ICD-10-CM | POA: Diagnosis present

## 2019-04-05 DIAGNOSIS — R55 Syncope and collapse: Secondary | ICD-10-CM | POA: Insufficient documentation

## 2019-04-05 HISTORY — PX: NM MYOVIEW LTD: HXRAD82

## 2019-04-05 LAB — MYOCARDIAL PERFUSION IMAGING
LV dias vol: 92 mL (ref 62–150)
LV sys vol: 39 mL
Peak HR: 85 {beats}/min
Rest HR: 53 {beats}/min
SDS: 6
SRS: 0
SSS: 6
TID: 1.15

## 2019-04-05 MED ORDER — TECHNETIUM TC 99M TETROFOSMIN IV KIT
31.2000 | PACK | Freq: Once | INTRAVENOUS | Status: AC | PRN
  Administered 2019-04-05: 31.2 via INTRAVENOUS
  Filled 2019-04-05: qty 32

## 2019-04-05 MED ORDER — REGADENOSON 0.4 MG/5ML IV SOLN
0.4000 mg | Freq: Once | INTRAVENOUS | Status: AC
  Administered 2019-04-05: 0.4 mg via INTRAVENOUS

## 2019-04-05 MED ORDER — TECHNETIUM TC 99M TETROFOSMIN IV KIT
10.9000 | PACK | Freq: Once | INTRAVENOUS | Status: AC | PRN
  Administered 2019-04-05: 10.9 via INTRAVENOUS
  Filled 2019-04-05: qty 11

## 2019-04-06 ENCOUNTER — Telehealth: Payer: Self-pay | Admitting: *Deleted

## 2019-04-06 NOTE — Telephone Encounter (Signed)
PATIENT REVIEWED VIA MYCHART ,  CALLED LEFT MESSAGE - @ REPORT AND THAT HE SHOULD CAL BACK END OF SEPT FOR AN APPT BETWEEN NOV AND JAN .

## 2019-04-06 NOTE — Telephone Encounter (Signed)
-----   Message from [name], MD sent at 04/05/2019 11:12 PM EDT ----- Doristine Devoid news.  Stress test is normal.  Maybe is a small area of old prior heart attack which goes along with the LAD stent.  Normal pump function.  No evidence of any significant heart artery blockages.  [name], MD   pls fwd to PCP: [name], MD

## 2019-06-28 ENCOUNTER — Other Ambulatory Visit: Payer: Self-pay | Admitting: Rehabilitation

## 2019-06-28 DIAGNOSIS — M5416 Radiculopathy, lumbar region: Secondary | ICD-10-CM

## 2019-06-29 ENCOUNTER — Telehealth: Payer: Self-pay | Admitting: Nurse Practitioner

## 2019-06-29 NOTE — Telephone Encounter (Signed)
Phone call to patient to verify medication list and allergies for myelogram procedure. Pt aware she will need to hold Plavix for potentially 5 days prior to myelogram appointment time pending approval and recommended time from Dr. [name]. Pt verbalized understanding. Pre and post procedure instructions reviewed with pt. Thinner hold request faxed to Dr. [name] awaiting reply.

## 2019-06-30 ENCOUNTER — Telehealth: Payer: Self-pay | Admitting: Cardiology

## 2019-06-30 NOTE — Telephone Encounter (Signed)
I have spoken with Alexander Armstrong, he is not sure if he will proceed with myelogram. Initially he was told he need a myelogram instead of a standard MRI because he has a pacemaker. However Alexander Armstrong does not have a PPM, instead he had a loop recorder (battery has already died and elected to leave in the body). He says he had MRI with the loop recorder before and did not have a issue. Currently [facility] is discussing with the requesting provider to see if this can be switched to a standard MRI instead.   I will hold off on this preop clearance. Patient has been instructed to let us know if this doctor will want him to proceed with a myelogram and cardiac clearance

## 2019-06-30 NOTE — Telephone Encounter (Signed)
Dr. [name] please review, ok with you to hold plavix for 5 days prior to myelogram? Last PCI was in 01/2017. Recent myoview in 04/2019 was low risk

## 2019-06-30 NOTE — Telephone Encounter (Signed)
Hold for 7 days  [facility]

## 2019-06-30 NOTE — Telephone Encounter (Signed)
New Message       [facility] Pre-operative Risk Assessment    Request for surgical clearance:  1. What type of surgery is being performed? Myelogram  2. When is this surgery scheduled? TBD  3. What type of clearance is required (medical clearance vs. Pharmacy clearance to hold med vs. Both)? Pharmacy  4. Are there any medications that need to be held prior to surgery and how long? Plavix, holding 5 days  5. Practice name and name of physician performing surgery? [facility], DR. Thomas Saullo   6. What is your office phone number [phone]   7.   What is your office fax number [phone]  8.   Anesthesia type (None, local, MAC, general) ? Local   Alexander Armstrong 06/30/2019, 10:06 AM  _________________________________________________________________   (provider comments below)

## 2019-07-04 NOTE — Telephone Encounter (Signed)
Called and spoke with patient's wife. He is proceeding with MRI. I will remove from preop pool.

## 2019-08-22 ENCOUNTER — Other Ambulatory Visit: Payer: Self-pay

## 2019-08-22 ENCOUNTER — Encounter: Payer: Self-pay | Admitting: Cardiology

## 2019-08-22 ENCOUNTER — Ambulatory Visit (INDEPENDENT_AMBULATORY_CARE_PROVIDER_SITE_OTHER): Payer: Medicare Other | Admitting: Cardiology

## 2019-08-22 VITALS — BP 116/67 | HR 64 | Temp 97.1°F | Ht 71.0 in | Wt 195.4 lb

## 2019-08-22 DIAGNOSIS — I4729 Other ventricular tachycardia: Secondary | ICD-10-CM

## 2019-08-22 DIAGNOSIS — I2510 Atherosclerotic heart disease of native coronary artery without angina pectoris: Secondary | ICD-10-CM | POA: Diagnosis not present

## 2019-08-22 DIAGNOSIS — I472 Ventricular tachycardia: Secondary | ICD-10-CM | POA: Diagnosis not present

## 2019-08-22 DIAGNOSIS — I951 Orthostatic hypotension: Secondary | ICD-10-CM

## 2019-08-22 DIAGNOSIS — Z955 Presence of coronary angioplasty implant and graft: Secondary | ICD-10-CM

## 2019-08-22 DIAGNOSIS — R55 Syncope and collapse: Secondary | ICD-10-CM | POA: Diagnosis not present

## 2019-08-22 DIAGNOSIS — E785 Hyperlipidemia, unspecified: Secondary | ICD-10-CM

## 2019-08-22 NOTE — Assessment & Plan Note (Signed)
Thankfully, no further episodes at this time.  Only on low-dose diltiazem.    No longer on mexiletine, amiodarone or other antiarrhythmics.  All discontinued secondary to side effects

## 2019-08-22 NOTE — Patient Instructions (Signed)
Medication Instructions:  No changes  If you need a refill on your cardiac medications before your next appointment, please call your pharmacy*   Lab Work: Not needed If you have labs (blood work) drawn today and your tests are completely normal, you will receive your results only by: . MyChart Message (if you have MyChart) OR . A paper copy in the mail If you have any lab test that is abnormal or we need to change your treatment, we will call you to review the results.   Testing/Procedures: Not needed   Follow-Up: At [facility], you and your health needs are our priority.  As part of our continuing mission to provide you with exceptional heart care, we have created designated Provider Care Teams.  These Care Teams include your primary Cardiologist (physician) and Advanced Practice Providers (APPs -  Physician Assistants and Nurse Practitioners) who all work together to provide you with the care you need, when you need it.     Your next appointment:   6 month(s)  The format for your next appointment:   In Person  Provider:   David Harding, MD   Other Instructions  

## 2019-08-22 NOTE — Assessment & Plan Note (Signed)
On moderate dose rosuvastatin.  Lipids look great last year in July.  Labs just checked now.  Should hopefully get the results sent to [place] within the next few days.  For now we will continue current dose of rosuvastatin.

## 2019-08-22 NOTE — Assessment & Plan Note (Signed)
No longer having issues with with static hypotension as we have reduce his medications.  No longer requiring midodrine.  Ensure adequate hydration.

## 2019-08-22 NOTE — Assessment & Plan Note (Signed)
No further episodes noted.  Denies any have any significant tachycardia on his Holter monitor.  He is not have any symptoms.  Plan for now to continue current low-dose diltiazem, better with reduced dose.  Avoiding antiarrhythmias at this point.  Would may be considered loop recorder if he has recurrent episodes.

## 2019-08-22 NOTE — Assessment & Plan Note (Signed)
Interestingly, despite having severe 99% LAD lesion, he did not notice any symptoms.  Had a grossly abnormal Myoview with reversible defect in the anterior wall.  Most recent Myoview now only shows a fixed apical defect consistent with prior MI.  No evidence of ischemia.  I think once we got him fully off of the mexiletine, his fatigue is gone away and he is feeling totally back to normal.  Not on beta-blocker because of bradycardia and fatigue, is on standing dose of statin but not able to tolerate higher dose.  We are using diltiazem as opposed to beta-blocker for rhythm and antianginal/cardiac benefit.  With normal EF.  This is reasonable. On Plavix alone--okay to hold for procedures  Plan: Continue current medications.  Reassured with negative Myoview.

## 2019-08-22 NOTE — Assessment & Plan Note (Signed)
Overlapping DES stents in the LAD as well as a DES stent in the RCA.  Given his relatively asymptomatic significant stenosis of the LAD stent, we are using maintenance dose of Plavix for prophylaxis.  He is beyond 2 years out from his PCI and therefore is okay to hold Plavix 5 to 7 days for procedures.  Just had it held for back injection.  Tolerated well.

## 2019-08-22 NOTE — Progress Notes (Signed)
3.  Primary Care Provider: Kristopher Armstrong., MD Cardiologist: Alexander Hew, MD Electrophysiologist:   Clinic Note: Chief Complaint  Patient presents with  . Follow-up    Stress test results; no major issues  . Coronary Artery Disease    Stress test results  . Loss of Consciousness    No further episodes, doing well off of medications.     HPI:    Alexander Armstrong is a 78 y.o. male with a history of CAD-LAD and RCA PCI described below who presents today for 73-monthfollow-up.   Anterior STEMI January 2006: LAD PCI, staged PCI RCA  Documented wide-complex tachycardia on loop recorder for evaluation of syncope (end of life of second recorder) -> initially maintained on flecainide that was eventually stopped because of his history of CAD (intolerant of amiodarone and mexiletine)  [facility] Myoview showed significant anterior defect with reversibility -> cath found severe in-stent and prestent stenosis of LAD stent and patent RCA stent --> DES PCI April 2018  [place] Alexander Armstrong last seen on Feb 20, 2019.  Noted definite improvement of the strange palpitation and dizziness symptoms that he was having after stopping motility and.  Only noted feeling tired quite quickly with routine activity.  We had to stop after moderate amount of exertion/activity.  No dyspnea or chest pain just fatigue.  He would get short of breath if he would push it too much but no chest pain.  (Of note, he never had "anginal chest pain "even when he had his anterior MI).  Noted some mild orthostatic hypotension symptoms when he first gets up but nothing significant.  Myoview ordered to evaluate fatigue is possible anginal equivalent (reviewed below)  Fasting lipid to be done prior to this visit  Recent Hospitalizations: None  Reviewed  CV studies:    The following studies were reviewed today: (if available, images/films reviewed: From Epic Chart or Care Everywhere)  . Myoview 04/05/2019: EF 55 to 65%.  Small size, mild severity fixed perfusion defect in the mid-apical anterior septal wall.  Likely represents old anterior infarct.   No evidence of ischemia. LOW RISK.   Interval History:   Alexander Klevereturns here today for 658-monthollow-up stating that he feels quite well actually.  He just had his labs done this morning albeit that they are not yet available. He feels much better now that mexiletine is fully out of his system.  He is able to do his work in his woParmerithout any difficulties.  He is able to walk around without fatigue.  Energy level is significantly improved.  He pretty much feels back to his baseline now with no major complaints.  Every now and then he may have few fluttering sensations but nothing the last more than a few seconds.  CV Review of Symptoms (Summary)  no chest pain or dyspnea on exertion positive for - Intermittent short bursts of palpitations (not associated with syncope or near syncope) negative for - edema, loss of consciousness, orthopnea, paroxysmal nocturnal dyspnea, shortness of breath or TIA/amaurosis fugax, claudication  The patient does not have symptoms concerning for COVID-19 infection (fever, chills, cough, or new shortness of breath).  The patient is practicing social distancing. ++ Masking.  He does go out as needed for groceries/shopping.  He does work in his shop essentially alone and therefore is safe with distancing.  He and his wife Alexander Armstrong keeping in touch with his family and not feeling  isolated.   REVIEWED OF SYSTEMS   A comprehensive ROS was performed. Review of Systems  Constitutional: Positive for weight loss (Intentional, he is now actually more active.). Negative for malaise/fatigue (Energy is much better).  HENT: Negative for nosebleeds.   Respiratory: Negative for cough and wheezing.   Cardiovascular: Negative for leg swelling.  Gastrointestinal: Negative for blood in stool,  heartburn, melena and nausea.  Musculoskeletal: Positive for joint pain (Mild arthritis pains but nothing significant). Negative for falls and myalgias (This past Friday he had probably a pinched nerve or something causing a stabbing sensation in the front of his left thigh that kept him up most of the night.).  Neurological: Positive for dizziness (Very rarely with standing up quickly; notably improved balance). Negative for focal weakness and weakness.  Endo/Heme/Allergies: Positive for environmental allergies. Does not bruise/bleed easily.  Psychiatric/Behavioral: Negative for depression and memory loss. The patient is not nervous/anxious and does not have insomnia.   All other systems reviewed and are negative.  I have reviewed and (if needed) personally updated the patient's problem list, medications, allergies, past medical and surgical history, social and family history.   PAST MEDICAL HISTORY   Past Medical History:  Diagnosis Date  . Allergy   . Arthritis   . 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 basal cell skin cancer   . CAD S/P percutaneous coronary angioplasty 11/03/2004   a. mLAD - Taxus DES 3.0 x 20; b. (11/06/04) staged PCI to RCA Taxus DES 3.5 x 16; c. (01/2017).  @ [facility] (Dr. [name]) Synergy DES 3.0 x 12 (for 99% lesion @ prox edge of old stent  . Cardiac syncope    No episode since starting flecainide. Had documented WIDE COMPLEX Tachycardia on loop recorder.  . Cataract bilateral cataracts  . Diverticulosis   . Esophageal stricture   . GERD (gastroesophageal reflux disease)   . Hiatal hernia 11/05/2010  . HLD (hyperlipidemia)   . 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 Unable to induce during EP study.  . Reflux gastritis   . STEMI involving left anterior descending coronary artery ([place]) 11/03/2004   [facility]: Occluded LAD treated with DES. Staged PCI to the RCA.     PAST SURGICAL HISTORY    Past Surgical History:  Procedure Laterality Date  . CARDIAC CATHETERIZATION  ~2011   [facility]: re-look Cath - patent stents  . CORONARY STENT INTERVENTION N/A 01/27/2017   Procedure: Coronary Stent Intervention;  Surgeon: [name], MD;  Location: [place] [facility]: mLAD (just after D1) overlapping prior DES -> SYNERGY DES 3X12 drug eluting stent  . ELECTROPHYSIOLOGIC STUDY  2014   Unable to stimulate VT seen on loop recorder  . HOLTER MONITOR  12/2018    (Off of mexiletine, only on diltiazem) heart rate ranged from 47 to 114 bpm.  Rare PACs and PVCs.  Sinus rhythm noted no A. fib or other arrhythmia.  [name] implanted loop heart monitor x2  ~2011; 2014   Per report- batter out of date; apparently captured a prolonged episode of wide complex tachycardia associated with an episode of weakness/near syncope  . INTRAVASCULAR PRESSURE WIRE/FFR STUDY N/A 01/27/2017   Procedure: Intravascular Pressure Wire/FFR Study;  Surgeon: [name], MD;  Location: [facility];  Service: Cardiovascular;  Laterality: N/A;  . LEFT HEART CATH AND CORONARY ANGIOGRAPHY N/A 01/27/2017   Procedure: Left Heart Cath and Coronary Angiography;  Surgeon: [name], MD;  Location: [facility]: mLAD 99% stenosis pre-Stent & ISR --> PCI. Ost DI ~50%. pRCA DES ~50-60% FFR 0.86.  [name] NM MYOVIEW LTD  07/2014   Unremarkable EKG. No evidence of ischemia or infarct. Mild anterior attenuation, cannot exclude prior infarct, but nonreversible. EF 66%.  [name] NM MYOVIEW LTD  12/22/2016   INTERMEDIATE RISK. EF 58%. Defect 1: Medium size severe defect in the mid anterior, apical anterior and apical wall consistent with prior anterior MI. Defect to: Large defect and moderate severity in the basal inferoseptal basal inferior and inferoseptal mid inferior wall consistent with ischemia.  [name] NM MYOVIEW LTD  04/05/2019   EF 55 to 65%.  Small size, mild severity fixed perfusion defect in the mid-apical anterior septal wall.   Likely represents old anterior infarct.   No evidence of ischemia. LOW RISK.   [name] PERCUTANEOUS CORONARY STENT INTERVENTION (PCI-S)  January-February 2006   a. mLAD - Taxus DES 3.0 x 20; b. (11/06/04) staged PCI to RCA Taxus DES 3.5 x 16; c. 01/27/2017: PCI to mLAD prox to old  stent - Synergy DES 3.0 x 12  . UMBILICAL HERNIA REPAIR     CAD Hx:   Anterior STEMI status post PCI to the LAD followed by staged PCI to the RCA in 2006. --> mLAD - Taxus DES 3.0 x 20; b. (11/06/04) staged PCI to RCA Taxus DES 3.5 x 16  WCT noted on ILR --> started on Flecainide (obviously stopped in 2018 following LAD PCI --> intolerant of amiodarone and  Mexiletine (Dr. Curt Bears); maintained on diltiazem  Myoview - 12/2016- done with initial New Patient assessment.  INTERMEDIATE RISK. EF 58%. Defect 1: Medium size severe defect in the mid anterior, apical anterior and apical wall consistent with prior anterior MI. Defect2: Large defect and moderate severity in the basal inferoseptal basal inferior and inferoseptal mid inferior wall consistent with ischemia.  (01/2017). @ [facility] (Dr. [name]) CATH-PCI OXB:DZHGDJM DES 3.0 x 12 (for 99% lesion @ prox edge of old stent   Referred toby Dr. Camnitz(EP)04/25/2017- started Diltiazem, continued Mexilitine. - no more VT. Still palpitations. -Now no longer on mexiletine & on lower dose diltiazem  Holter monitor March 2020: Heart rate ranged from 47 to 114 bpm.  Rare PACs and PVCs.  Sinus rhythm noted no A. fib or other arrhythmia.   MEDICATIONS/ALLERGIES   Current Meds  Medication Sig  . acetaminophen (TYLENOL) 500 MG tablet Take 1,000 mg by mouth 3 (three) times daily as needed for moderate pain or headache.  . Cholecalciferol (VITAMIN D3) 1000 units CAPS Take 1,000 Units by mouth at bedtime.  . clopidogrel (PLAVIX) 75 MG tablet TAKE 1 TABLET(75 MG) BY MOUTH DAILY  . diltiazem (CARDIZEM CD) 120 MG 24 hr capsule Take 1 capsule (120 mg total) by mouth daily.  [name]  oxybutynin (DITROPAN) 5 MG tablet Take 5 mg by mouth daily.  . pantoprazole (PROTONIX) 40 MG tablet Take 40 mg by mouth daily.  . rosuvastatin (CRESTOR) 20 MG tablet Take 20 mg by mouth daily.  . tamsulosin (FLOMAX) 0.4 MG CAPS capsule Take 0.4 mg by mouth daily after supper.     Allergies  Allergen Reactions  . Silver Sulfadiazine Hives and Rash  . Latex Other (See Comments)    "takes the skin off"  . Adhesive [Tape] Rash  . Hydrocodone Rash    SOCIAL HISTORY/FAMILY HISTORY   Social History   Tobacco Use  . Smoking status: Never Smoker  . Smokeless tobacco: Never Used  Substance Use Topics  . Alcohol use: No  . Drug use: No   Social History   Social History Narrative  . Not on file    Family History family history includes Cancer in his father; Glaucoma in his paternal aunt; Heart attack in his brother; Heart disease in his mother; Macular degeneration in his father; Multiple myeloma in his mother; Prostate cancer in his father; Uterine cancer in his mother.   OBJCTIVE -PE, EKG, labs   Wt Readings from Last 3 Encounters:  08/22/19 195 lb 6.4 oz (88.6 kg)  04/05/19 202 lb (91.6 kg)  02/20/19 202 lb 6.4 oz (91.8 kg)    Physical Exam: BP 116/67   Pulse 64   Temp (!) 97.1 F (36.2 C)   Ht 5' 11"  (1.803 m)   Wt 195 lb 6.4 oz (88.6 kg)   SpO2 96%   BMI 27.25 kg/m  Physical Exam  Constitutional: He is oriented to person, place, and time. He appears well-developed and well-nourished. No distress.  Well-groomed.  Healthy-appearing.  Looks younger than stated age besides his silver hair.  HENT:  Head: Normocephalic and atraumatic.  Neck: Normal range of motion. Neck supple. No hepatojugular reflux and no JVD present. Carotid bruit is not present.  Cardiovascular: Normal rate, regular rhythm, normal heart sounds and intact distal pulses.  No extrasystoles are present. PMI is not displaced. Exam reveals no gallop and no friction rub.  No murmur heard.  Pulmonary/Chest: Effort normal and breath sounds normal. No respiratory distress. He has no wheezes. He has no rales.  Abdominal: Soft. Bowel sounds are normal. He exhibits no distension. There is no abdominal tenderness. There is no rebound.  No HSM.  Mild truncal obesity  Musculoskeletal: Normal range of motion.        General: No deformity or edema.  Neurological: He is oriented to person, place, and time.  Psychiatric: He has a normal mood and affect. His behavior is normal. Judgment and thought content normal.  Vitals reviewed.    Adult ECG Report  Rate: 64 ;  Rhythm: normal sinus rhythm, premature ventricular contractions (PVC) and 1 degree AVB.  Otherwise normal axis, intervals and durations.;   Narrative Interpretation: Relatively normal EKG besides 1 PVC.  Recent Labs:  Labs done today. No results found for: CHOL, HDL, LDLCALC, LDLDIRECT, TRIG, CHOLHDL Lab Results  Component Value Date   CREATININE 1.06 01/27/2017   BUN 18 01/27/2017   NA 137 01/27/2017   K 4.0 01/27/2017   CL 106 01/27/2017   CO2 23 01/27/2017    ASSESSMENT/PLAN    Problem List Items Addressed This Visit    Coronary artery disease involving native coronary artery of native heart without angina pectoris - Primary (Chronic)    Interestingly, despite having severe 99% LAD lesion, he did not notice any symptoms.  Had a grossly abnormal Myoview with reversible defect in the anterior wall.  Most recent Myoview now only shows a fixed apical defect consistent with prior MI.  No evidence of ischemia.  I think once we got him fully off of the mexiletine, his fatigue is gone away and he is feeling totally back to normal.  Not on beta-blocker because of bradycardia and fatigue, is on standing dose of statin but not able to tolerate higher dose.  We are using diltiazem as opposed to beta-blocker for rhythm and antianginal/cardiac benefit.  With normal EF.  This is reasonable. On Plavix alone--okay to hold for  procedures  Plan: Continue current medications.  Reassured with negative Myoview.      Relevant Orders   EKG 12-Lead   Cardiac syncope (Chronic)    No further episodes noted.  Denies any have any significant tachycardia on his Holter monitor.  He is not have any symptoms.  Plan for now to continue current low-dose diltiazem, better with reduced dose.  Avoiding antiarrhythmias at this point.  Would may be considered loop recorder if he has recurrent episodes.      Presence of drug coated stent in LAD coronary artery (Chronic)    Overlapping DES stents in the LAD as well as a DES stent in the RCA.  Given his relatively asymptomatic significant stenosis of the LAD stent, we are using maintenance dose of Plavix for prophylaxis.  He is beyond 2 years out from his PCI and therefore is okay to hold Plavix 5 to 7 days for procedures.  Just had it held for back injection.  Tolerated well.      Hyperlipidemia LDL goal <70 (Chronic)    On moderate dose rosuvastatin.  Lipids look great last year in July.  Labs just checked now.  Should hopefully get the results sent to [place] within the next few days.  For now we will continue current dose of rosuvastatin.      Relevant Orders   EKG 12-Lead   Hypotension (Chronic)    No longer having issues with with static hypotension as we have reduce his medications.  No longer requiring midodrine.  Ensure adequate hydration.      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  Only on low-dose diltiazem.    No longer on mexiletine, amiodarone or other antiarrhythmics.  All discontinued secondary to side effects      Relevant Orders   EKG 12-Lead       COVID-19 Education: The signs and symptoms of COVID-19 were discussed with the patient and how to seek care for testing (follow up with PCP or arrange E-visit).   The importance of social distancing was discussed today.  I spent a total of 66mnutes with the patient  and chart review. >  50% of the time was spent in direct patient consultation.  Additional time spent with chart review (studies, outside notes, etc): 6 Total Time: 22 min   Current medicines are reviewed at length with the patient today.  (+/- concerns) none   Patient Instructions / Medication Changes & Studies & Tests Ordered   Patient Instructions  Medication Instructions:  No changes *If you need a refill on your cardiac medications before your next appointment, please call your pharmacy*  Lab Work: Not needed If you have labs (blood work) drawn today and your tests are completely normal, you will receive your results only by: .Marland KitchenMyChart Message (if you have MyChart) OR . A paper copy in the mail If you have any lab test that is abnormal or we need to change your treatment, we will call you to review the results.  Testing/Procedures: Not needed  Follow-Up: [facility] and [facility] our [facility].  As part of our continuing mission to provide you with exceptional heart care, we have created designated Provider Care Teams.  These Care Teams include your primary Cardiologist (physician) and Advanced Practice Providers (APPs -  Physician Assistants and Nurse Practitioners) who all work together to provide you with the care you need, when you need it.  Your next appointment:   6 month(s)  The format for your next appointment:   In Person  Provider:   [name], MD  Other Instructions     Studies Ordered:   Orders Placed This Encounter  Procedures  . EKG 12-Lead     Alexander Armstrong, M.D., M.S. Interventional Cardiologist   Pager # [phone] Phone # [phone] [address] [place], [place] [address]   Thank you for choosing Heartcare at [facility]!!

## 2019-08-28 ENCOUNTER — Other Ambulatory Visit: Payer: Self-pay | Admitting: Cardiology

## 2019-11-02 ENCOUNTER — Other Ambulatory Visit: Payer: Self-pay | Admitting: Cardiology

## 2019-12-22 ENCOUNTER — Ambulatory Visit (INDEPENDENT_AMBULATORY_CARE_PROVIDER_SITE_OTHER): Payer: Medicare Other | Admitting: Gastroenterology

## 2019-12-22 ENCOUNTER — Other Ambulatory Visit: Payer: Self-pay

## 2019-12-22 ENCOUNTER — Encounter: Payer: Self-pay | Admitting: Gastroenterology

## 2019-12-22 ENCOUNTER — Telehealth: Payer: Self-pay

## 2019-12-22 VITALS — BP 122/70 | HR 68 | Temp 98.2°F | Ht 68.25 in | Wt 196.5 lb

## 2019-12-22 DIAGNOSIS — K5730 Diverticulosis of large intestine without perforation or abscess without bleeding: Secondary | ICD-10-CM

## 2019-12-22 DIAGNOSIS — Z1211 Encounter for screening for malignant neoplasm of colon: Secondary | ICD-10-CM

## 2019-12-22 DIAGNOSIS — Z01818 Encounter for other preprocedural examination: Secondary | ICD-10-CM

## 2019-12-22 DIAGNOSIS — K2270 Barrett's esophagus without dysplasia: Secondary | ICD-10-CM

## 2019-12-22 DIAGNOSIS — K5909 Other constipation: Secondary | ICD-10-CM

## 2019-12-22 DIAGNOSIS — R1032 Left lower quadrant pain: Secondary | ICD-10-CM

## 2019-12-22 DIAGNOSIS — I2510 Atherosclerotic heart disease of native coronary artery without angina pectoris: Secondary | ICD-10-CM

## 2019-12-22 DIAGNOSIS — Z7902 Long term (current) use of antithrombotics/antiplatelets: Secondary | ICD-10-CM

## 2019-12-22 NOTE — Progress Notes (Signed)
[facility] Consult Note:  History: Alexander Armstrong 12/22/2019  Referring provider: [name], MD  Reason for consult/chief complaint: Gastroesophageal Reflux, Abdominal Pain (lower abd), fecal urgency (have urgency and pain when have to have a BM then nothing comes out), and Constipation (alternating with diarrhea)   Subjective  HPI: Previous patient of Dr. [name].  Upper endoscopy February 2015 for history of Barrett's esophagus.  Reported finding of Barrett's, though endoscopic shows a very small single tongue of salmon-colored mucosa, more like irregular Z-line then definite Barrett's.  Biopsy shows intestinal metaplasia, though not clear if possibly gastric cardia tissue. 2002 endoscopy photos show a more convincing and somewhat longer length of salmon-colored mucosa, also with biopsies showing intestinal metaplasia. Last colonoscopy by Dr. [name] March 2009 (indication was abdominal pain and bloating) with a reportedly good prep after GoLYTELY (though endoscopic photos show some left colon diverticular stool balls).  No polyps.  Severe left-sided diverticulosis with reported erythema, haustral thickening and spasm.    This is a very pleasant 79 year old man referred by primary care to reestablish GI care.  Regarding his reflux, he experiences regurgitation if he eats too late in the evening.  He also has rare episodes of dysphagia if he eats too quickly.  Otherwise, his reflux symptoms seem to be under good control with Protonix once every morning. He still struggles with chronic constipation, and sometimes may go up to a week between BMs.  About every 3 to 4 months he will have an episode of severe left lower quadrant sharp crampy pain that will awaken him from sleep.  Will feel like he has to have a bowel movement but nothing will occur.  It will gradually subside over the next couple of hours.  He has tried taking MiraLAX and other treatments for his  constipation, but it tends to make the stool too soft or loose, and then he may have some fecal incontinence.  Sometimes a bowel movement may just be since of small stool balls.  ROS:  Review of Systems  Constitutional: Negative for appetite change and unexpected weight change.  HENT: Negative for mouth sores and voice change.   Eyes: Negative for pain and redness.  Respiratory: Negative for cough and shortness of breath.   Cardiovascular: Negative for chest pain and palpitations.  Genitourinary: Negative for dysuria and hematuria.  Musculoskeletal: Negative for arthralgias and myalgias.  Skin: Negative for pallor and rash.  Neurological: Negative for weakness and headaches.  Hematological: Negative for adenopathy.     Past Medical History: Past Medical History:  Diagnosis Date  . Allergy   . Arthritis   . 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 basal cell skin cancer   . CAD S/P percutaneous coronary angioplasty 11/03/2004   a. mLAD - Taxus DES 3.0 x 20; b. (11/06/04) staged PCI to RCA Taxus DES 3.5 x 16; c. (01/2017).  @ [facility] (Dr. [name]) Synergy DES 3.0 x 12 (for 99% lesion @ prox edge of old stent  . Cardiac syncope    No episode since starting flecainide. Had documented WIDE COMPLEX Tachycardia on loop recorder.  . Cataract bilateral cataracts  . Diverticulosis   . Esophageal stricture   . GERD (gastroesophageal reflux disease)   . Hiatal hernia 11/05/2010  . HLD (hyperlipidemia)   . 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 Unable to induce during EP study.  . Reflux gastritis   . STEMI involving left anterior descending coronary artery ([place]) 11/03/2004  [facility]: Occluded LAD treated with DES. Staged PCI to the RCA.   I reviewed his most recent cardiology office note from 08/22/2019, describing coronary artery disease with in-stent restenosis requiring additional placement of drug-eluting stent placement  in April 2018.  He was also previously on mexiletine that was discontinued, and patient's fatigue significantly improved afterwards. From that provider's note: "Overlapping DES stents in the LAD as well as a DES stent in the RCA.  Given his relatively asymptomatic significant stenosis of the LAD stent, we are using maintenance dose of Plavix for prophylaxis.   He is beyond 2 years out from his PCI and therefore is okay to hold Plavix 5 to 7 days for procedures.  Just had it held for back injection.  Tolerated well."   Past Surgical History: Past Surgical History:  Procedure Laterality Date  . CARDIAC CATHETERIZATION  ~2011   [facility]: re-look Cath - patent stents  . CORONARY STENT INTERVENTION N/A 01/27/2017   Procedure: Coronary Stent Intervention;  Surgeon: [name], MD;  Location: [place] [facility]: mLAD (just after D1) overlapping prior DES -> SYNERGY DES 3X12 drug eluting stent  . ELECTROPHYSIOLOGIC STUDY  2014   Unable to stimulate VT seen on loop recorder  . HOLTER MONITOR  12/2018    (Off of mexiletine, only on diltiazem) heart rate ranged from 47 to 114 bpm.  Rare PACs and PVCs.  Sinus rhythm noted no A. fib or other arrhythmia.  [name] implanted loop heart monitor x2  ~2011; 2014   Per report- batter out of date; apparently captured a prolonged episode of wide complex tachycardia associated with an episode of weakness/near syncope  . INTRAVASCULAR PRESSURE WIRE/FFR STUDY N/A 01/27/2017   Procedure: Intravascular Pressure Wire/FFR Study;  Surgeon: [name], MD;  Location: [place] [facility];  Service: Cardiovascular;  Laterality: N/A;  . LEFT HEART CATH AND CORONARY ANGIOGRAPHY N/A 01/27/2017   Procedure: Left Heart Cath and Coronary Angiography;  Surgeon: [name], MD;  Location: [facility] INVASIVE [facility] LAB: mLAD 99% stenosis pre-Stent & ISR --> PCI. Ost DI ~50%. pRCA DES ~50-60% FFR 0.86.  [name] NM MYOVIEW LTD  07/2014   Unremarkable EKG. No evidence of ischemia or infarct.  Mild anterior attenuation, cannot exclude prior infarct, but nonreversible. EF 66%.  [name] NM MYOVIEW LTD  12/22/2016   INTERMEDIATE RISK. EF 58%. Defect 1: Medium size severe defect in the mid anterior, apical anterior and apical wall consistent with prior anterior MI. Defect to: Large defect and moderate severity in the basal inferoseptal basal inferior and inferoseptal mid inferior wall consistent with ischemia.  [name] NM MYOVIEW LTD  04/05/2019   EF 55 to 65%.  Small size, mild severity fixed perfusion defect in the mid-apical anterior septal wall.  Likely represents old anterior infarct.   No evidence of ischemia. LOW RISK.   [name] PERCUTANEOUS CORONARY STENT INTERVENTION (PCI-S)  January-February 2006   a. mLAD - Taxus DES 3.0 x 20; b. (11/06/04) staged PCI to RCA Taxus DES 3.5 x 16; c. 01/27/2017: PCI to mLAD prox to old  stent - Synergy DES 3.0 x 12  . UMBILICAL HERNIA REPAIR       Family History: Family History  Problem Relation Age of Onset  . Uterine cancer Mother   . Multiple myeloma Mother   . Heart disease Mother   . Prostate cancer Father   . Cancer Father        sarcoma  . Macular  degeneration Father   . Heart attack Brother   . Glaucoma Paternal Aunt   . Colon cancer Neg Hx   . Esophageal cancer Neg Hx   . Rectal cancer Neg Hx   . Stomach cancer Neg Hx     Social History: Social History   Socioeconomic History  . Marital status: Married    Spouse name: Not on file  . Number of children: 1  . Years of education: Not on file  . Highest education level: Not on file  Occupational History  . Occupation: Land: Retired  Tobacco Use  . Smoking status: Never Smoker  . Smokeless tobacco: Never Used  Substance and Sexual Activity  . Alcohol use: No  . Drug use: No  . Sexual activity: Not on file  Other Topics Concern  . Not on file  Social History Narrative  . Not on file   Social Determinants of Health   Financial Resource Strain:   . Difficulty of  Paying Living Expenses:   Food Insecurity:   . Worried About Charity fundraiser in the Last Year:   . Arboriculturist in the Last Year:   Transportation Needs:   . Film/video editor (Medical):   [name] Lack of Transportation (Non-Medical):   Physical Activity:   . Days of Exercise per Week:   . Minutes of Exercise per Session:   Stress:   . Feeling of Stress :   Social Connections:   . Frequency of Communication with Friends and Family:   . Frequency of Social Gatherings with Friends and Family:   . Attends Religious Services:   . Active Member of Clubs or Organizations:   . Attends Archivist Meetings:   [name] Marital Status:     Allergies: Allergies  Allergen Reactions  . Silver Sulfadiazine Hives and Rash  . Latex Other (See Comments)    "takes the skin off"  . Adhesive [Tape] Rash  . Hydrocodone Rash    Outpatient Meds: Current Outpatient Medications  Medication Sig Dispense Refill  . acetaminophen (TYLENOL) 500 MG tablet Take 1,000 mg by mouth 3 (three) times daily as needed for moderate pain or headache.    . Cholecalciferol (VITAMIN D3) 1000 units CAPS Take 1,000 Units by mouth at bedtime.    . clopidogrel (PLAVIX) 75 MG tablet TAKE 1 TABLET(75 MG) BY MOUTH DAILY 90 tablet 2  . diltiazem (CARDIZEM CD) 120 MG 24 hr capsule TAKE 1 CAPSULE(120 MG) BY MOUTH DAILY 90 capsule 3  . oxybutynin (DITROPAN) 5 MG tablet Take 5 mg by mouth daily.    . pantoprazole (PROTONIX) 40 MG tablet Take 40 mg by mouth daily.    . rosuvastatin (CRESTOR) 20 MG tablet Take 20 mg by mouth daily.    . tamsulosin (FLOMAX) 0.4 MG CAPS capsule Take 0.4 mg by mouth daily after supper.      No current facility-administered medications for this visit.      ___________________________________________________________________ Objective   Exam:  BP 122/70 (BP Location: Left Arm, Patient Position: Sitting, Cuff Size: Normal)   Pulse 68   Temp 98.2 F (36.8 C)   Ht 5' 8.25" (1.734 m)  Comment: height measured without shoes  Wt 196 lb 8 oz (89.1 kg)   BMI 29.66 kg/m    General: Well-appearing, pleasant and conversational.  Good functional status, ambulatory, gets on exam table without difficulty.  Eyes: sclera anicteric, no redness  ENT: oral mucosa moist without lesions,  no cervical or supraclavicular lymphadenopathy.  Upper and lower dentures   CV: RRR without murmur, S1/S2, no JVD, no peripheral edema  Resp: clear to auscultation bilaterally, normal RR and effort noted  GI: soft, no tenderness, with active bowel sounds. No guarding or palpable organomegaly noted.  Reducible umbilical hernia and rectus diastasis  Skin; warm and dry, no rash or jaundice noted  Neuro: awake, alert and oriented x 3. Normal gross motor function and fluent speech  Labs:  CBC Latest Ref Rng & Units 01/21/2017 04/05/2012  WBC 3.8 - 10.8 K/uL 8.6 6.3  Hemoglobin 13.2 - 17.1 g/dL 13.1(L) 13.9  Hematocrit 38.5 - 50.0 % 38.7 41.4  Platelets 140 - 400 K/uL 205 182.0   CMP Latest Ref Rng & Units 01/27/2017 01/21/2017 04/05/2012  Glucose 65 - 99 mg/dL 130(H) 103(H) 101(H)  BUN 6 - 20 mg/dL 18 21 22   Creatinine 0.61 - 1.24 mg/dL 1.06 1.33(H) 0.9  Sodium 135 - 145 mmol/L 137 137 137  Potassium 3.5 - 5.1 mmol/L 4.0 4.7 4.3  Chloride 101 - 111 mmol/L 106 104 105  CO2 22 - 32 mmol/L 23 25 24   Calcium 8.9 - 10.3 mg/dL 9.0 9.3 9.6  Total Protein 6.0 - 8.3 g/dL - - 7.3  Total Bilirubin 0.3 - 1.2 mg/dL - - 0.7  Alkaline Phos 39 - 117 U/L - - 57  AST 0 - 37 U/L - - 32  ALT 0 - 53 U/L - - 28     Assessment: Encounter Diagnoses  Name Primary?  . Barrett's esophagus without dysplasia Yes  . Chronic constipation   . Diverticulosis of colon without hemorrhage   . Special screening for malignant neoplasms, colon   . Long term (current) use of antithrombotics/antiplatelets   . Coronary artery disease involving native coronary artery of native heart without angina pectoris   . Preprocedural  examination   . Abdominal pain, left lower quadrant     Established Barrett's esophagus, overdue for surveillance.  Reflux symptoms seem under good control with medication and cautious diet.  Chronic constipation, most likely related to his known severe left colon diverticulosis.  It sounds like he is intolerant of cathartic laxatives or MiraLAX.  He probably has a left lower quadrant pain from either diverticular spasm or progressive constipation.  Despite his age and medical conditions, he has good functional status, and his last screening colonoscopy was in 2009.  This, along with the ongoing constipation and left lower quadrant pain warrants colonoscopy.  He is also need of upper endoscopy for Barrett's surveillance.  He will need to be off Plavix 5 days prior, which his cardiologist will likely be agreeable to given their most recent assessment noted above. Plan:  EGD and colonoscopy.  He was agreeable after discussion of procedure and risks.  The benefits and risks of the planned procedure were described in detail with the patient or (when appropriate) their health care proxy.  Risks were outlined as including, but not limited to, bleeding, infection, perforation, adverse medication reaction leading to cardiac or pulmonary decompensation, pancreatitis (if ERCP).  The limitation of incomplete mucosal visualization was also discussed.  No guarantees or warranties were given.  Patient at increased risk for cardiopulmonary complications of procedure due to medical comorbidities.  No changes in therapy at this point.  Reinforced need for diet and lifestyle measures for reflux.  Thank you for the courtesy of this consult.  Please call me with any questions or concerns.  Nelida Meuse III  CC: Referring provider noted above

## 2019-12-22 NOTE — Telephone Encounter (Signed)
   Primary Cardiologist: [name], MD  Chart reviewed as part of pre-operative protocol coverage. Given past medical history and time since last visit, based on ACC/AHA guidelines, Sabien Makarewicz would be at acceptable risk for the planned procedure without further cardiovascular testing.   Per Dr Allison Quarry last office not-OK to hold Plavix 5 days pre op if needed. I spoke with the patient's wife today and relayed this message.  I will route this recommendation to the requesting party via Epic fax function and remove from pre-op pool.  Please call with questions.  Kerin Ransom, PA-C 12/22/2019, 1:39 PM

## 2019-12-22 NOTE — Telephone Encounter (Signed)
[facility] Pre-operative Risk Assessment     Request for surgical clearance:     Endoscopy Procedure  What type of surgery is being performed?     EGD/colonoscopy   When is this surgery scheduled?     01-25-2020  What type of clearance is required ?   Pharmacy  Are there any medications that need to be held prior to surgery and how long? Yes, Plavix, 5 days prior   Practice name and name of physician performing surgery?      [facility]  What is your office phone and fax number?      Phone- [phone]  [phone]  Anesthesia type (None, local, MAC, general) ?       MAC

## 2019-12-22 NOTE — Patient Instructions (Signed)
If you are age 79 or older, your body mass index should be between 23-30. Your Body mass index is 29.66 kg/m. If this is out of the aforementioned range listed, please consider follow up with your Primary Care Provider.  If you are age 78 or younger, your body mass index should be between 19-25. Your Body mass index is 29.66 kg/m. If this is out of the aformentioned range listed, please consider follow up with your Primary Care Provider.   You have been scheduled for an endoscopy and colonoscopy. Please follow the written instructions given to you at your visit today. Please pick up your prep supplies at the pharmacy within the next 1-3 days. If you use inhalers (even only as needed), please bring them with you on the day of your procedure. Your physician has requested that you go to www.startemmi.com and enter the access code given to you at your visit today. This web site gives a general overview about your procedure. However, you should still follow specific instructions given to you by our office regarding your preparation for the procedure.  You will be contacted by our office prior to your procedure for directions on holding your Plavix.  If you do not hear from our office 1 week prior to your scheduled procedure, please call [phone] to discuss.   Please use Miralax 3 days prior to the procedure.   It was a pleasure to see you today!  Dr. Loletha Carrow

## 2019-12-25 NOTE — Telephone Encounter (Signed)
Patient has a voicemail box that has not yet been set up and home phone just ran, could not leave a message. From this documentation his wife has been informed that its ok to hold the Plavix for 5 days prior to his procedure.

## 2019-12-25 NOTE — Telephone Encounter (Signed)
Please clarify the clearance.  Reply says not OK. Dr Allison Quarry last note say OK to hold for procedures. Thank you

## 2019-12-25 NOTE — Telephone Encounter (Signed)
I think that was supposed to say "note" not "not." It is OK to hold Plavix for 5 days. Sorry about the confusion. Thank you for clarifying.   - Giuliano Preece

## 2019-12-27 NOTE — Telephone Encounter (Signed)
Pt has been notified and aware. He states clear understanding to hold his Plavix 5 days prior to his procedure.

## 2020-01-23 ENCOUNTER — Ambulatory Visit (INDEPENDENT_AMBULATORY_CARE_PROVIDER_SITE_OTHER): Payer: Medicare Other

## 2020-01-23 ENCOUNTER — Other Ambulatory Visit: Payer: Self-pay | Admitting: Gastroenterology

## 2020-01-23 DIAGNOSIS — Z1159 Encounter for screening for other viral diseases: Secondary | ICD-10-CM

## 2020-01-24 LAB — SARS CORONAVIRUS 2 (TAT 6-24 HRS): SARS Coronavirus 2: NEGATIVE

## 2020-01-25 ENCOUNTER — Other Ambulatory Visit: Payer: Self-pay

## 2020-01-25 ENCOUNTER — Encounter: Payer: Self-pay | Admitting: Gastroenterology

## 2020-01-25 ENCOUNTER — Ambulatory Visit (AMBULATORY_SURGERY_CENTER): Payer: Medicare Other | Admitting: Gastroenterology

## 2020-01-25 VITALS — BP 118/66 | HR 56 | Temp 97.3°F | Resp 12 | Ht 68.0 in | Wt 196.0 lb

## 2020-01-25 DIAGNOSIS — K2270 Barrett's esophagus without dysplasia: Secondary | ICD-10-CM

## 2020-01-25 DIAGNOSIS — Z1211 Encounter for screening for malignant neoplasm of colon: Secondary | ICD-10-CM | POA: Diagnosis not present

## 2020-01-25 DIAGNOSIS — D122 Benign neoplasm of ascending colon: Secondary | ICD-10-CM

## 2020-01-25 MED ORDER — SODIUM CHLORIDE 0.9 % IV SOLN: 500.0000 mL | Freq: Once | INTRAVENOUS | Status: DC

## 2020-01-25 NOTE — Progress Notes (Signed)
Called to room to assist during endoscopic procedure.  Patient ID and intended procedure confirmed with present staff. Received instructions for my participation in the procedure from the performing physician.  

## 2020-01-25 NOTE — Progress Notes (Signed)
PT taken to PACU. Monitors in place. VSS. Report given to RN. 

## 2020-01-25 NOTE — Op Note (Signed)
[place] Patient Name: Alexander Armstrong Procedure Date: 01/25/2020 8:44 AM MRN: [id number] Endoscopist: [place]. Alexander [facility] , MD Age: 79 Referring MD:  Date of Birth: 10-Dec-1940 Gender: Male Account #: [id number] Procedure:                Upper GI endoscopy Indications:              Surveillance for malignancy due to personal history                            of Barrett's esophagus (long history non-dysplastic                            Barrett's) Medicines:                Monitored Anesthesia Care Procedure:                Pre-Anesthesia Assessment:                           - Prior to the procedure, a History and Physical                            was performed, and patient medications and                            allergies were reviewed. The patient's tolerance of                            previous anesthesia was also reviewed. The risks                            and benefits of the procedure and the sedation                            options and risks were discussed with the patient.                            All questions were answered, and informed consent                            was obtained. Prior Anticoagulants: The patient has                            taken Plavix (clopidogrel), last dose was 5 days                            prior to procedure. ASA Grade Assessment: III - A                            patient with severe systemic disease. After                            reviewing the risks and benefits, the patient was  deemed in satisfactory condition to undergo the                            procedure.                           After obtaining informed consent, the endoscope was                            passed under direct vision. Throughout the                            procedure, the patient's blood pressure, pulse, and                            oxygen saturations were monitored continuously. The    Endoscope was introduced through the mouth, and                            advanced to the second part of duodenum. The upper                            GI endoscopy was accomplished without difficulty.                            The patient tolerated the procedure well. Scope In: Scope Out: Findings:                 There were esophageal mucosal changes secondary to                            established short-segment Barrett's disease present                            in the distal esophagus. The maximum longitudinal                            extent of these mucosal changes was 2-3 cm in                            length (single tongue of tissue above upper limit                            of gastric folds). (Examined under WL and NBI). No                            raised or otherwise suspicious areas. Mucosa was                            biopsied with a cold forceps for histology. One                            specimen bottle was sent to pathology.  The stomach was normal.                           The cardia and gastric fundus were normal on                            retroflexion.                           The examined duodenum was normal. Complications:            No immediate complications. Estimated Blood Loss:     Estimated blood loss was minimal. Impression:               - Esophageal mucosal changes secondary to                            established short-segment Barrett's disease.                            Biopsied.                           - Normal stomach.                           - Normal examined duodenum. Recommendation:           - Patient has a contact number available for                            emergencies. The signs and symptoms of potential                            delayed complications were discussed with the                            patient. Return to normal activities tomorrow.                            Written  discharge instructions were provided to the                            patient.                           - Resume previous diet.                           - Resume Plavix (clopidogrel) at prior dose                            tomorrow.                           - Await pathology results.                           - If no dysplasia on current biopsies, no  repeat                            upper endoscopy for surveillance of Barrett's                            esophagus due to age and clinical history.                           - See the other procedure note for documentation of                            additional recommendations. Alexander Herbig L. Alexander Carrow, MD 01/25/2020 9:32:36 AM This report has been signed electronically.

## 2020-01-25 NOTE — Op Note (Signed)
Alexander Armstrong Patient Name: Alexander Armstrong Procedure Date: 01/25/2020 8:43 AM MRN: [id number] Endoscopist: [place]. Loletha Carrow , MD Age: 79 Referring MD:  Date of Birth: [birth year] Gender: Male Account #: [id number] Procedure:                Colonoscopy Indications:              Screening for colorectal malignant neoplasm Medicines:                Monitored Anesthesia Care Procedure:                Pre-Anesthesia Assessment:                           - Prior to the procedure, a History and Physical                            was performed, and patient medications and                            allergies were reviewed. The patient's tolerance of                            previous anesthesia was also reviewed. The risks                            and benefits of the procedure and the sedation                            options and risks were discussed with the patient.                            All questions were answered, and informed consent                            was obtained. Prior Anticoagulants: The patient has                            taken Plavix (clopidogrel), last dose was 5 days                            prior to procedure. ASA Grade Assessment: III - A                            patient with severe systemic disease. After                            reviewing the risks and benefits, the patient was                            deemed in satisfactory condition to undergo the                            procedure.  After obtaining informed consent, the colonoscope                            was passed under direct vision. Throughout the                            procedure, the patient's blood pressure, pulse, and                            oxygen saturations were monitored continuously. The                            Colonoscope was introduced through the anus and                            advanced to the the cecum, identified by                 appendiceal orifice and ileocecal valve. The                            colonoscopy was performed without difficulty. The                            patient tolerated the procedure well. The quality                            of the bowel preparation was good. The ileocecal                            valve, appendiceal orifice, and rectum were                            photographed. The quality of the bowel preparation                            was evaluated using the BBPS [facility] Bowel                            Preparation Scale) with scores of: Right Colon = 2,                            Transverse Colon = 2 and Left Colon = 2. The total                            BBPS score equals 6. The bowel preparation used was                            GoLYTELY. Scope In: 9:04:27 AM Scope Out: 9:19:34 AM Scope Withdrawal Time: 0 hours 11 minutes 34 seconds  Total Procedure Duration: 0 hours 15 minutes 7 seconds  Findings:                 The perianal and digital rectal examinations were  normal.                           Multiple diverticula were found in the left colon.                           Three sessile polyps were found in the ascending                            colon. The polyps were diminutive in size. These                            polyps were removed with a cold biopsy forceps.                            Resection and retrieval were complete.                           The exam was otherwise without abnormality on                            direct and retroflexion views. Complications:            No immediate complications. Estimated Blood Loss:     Estimated blood loss was minimal. Impression:               - Diverticulosis in the left colon.                           - Three diminutive polyps in the ascending colon,                            removed with a cold biopsy forceps. Resected and                            retrieved.                            - The examination was otherwise normal on direct                            and retroflexion views. Recommendation:           - Patient has a contact number available for                            emergencies. The signs and symptoms of potential                            delayed complications were discussed with the                            patient. Return to normal activities tomorrow.                            Written discharge instructions were provided to the  patient.                           - Resume previous diet.                           - Resume Plavix (clopidogrel) at prior dose                            tomorrow.                           - Await pathology results.                           - No repeat colonoscopy due to age. Alexander Armstrong L. Loletha Carrow, MD 01/25/2020 9:37:44 AM This report has been signed electronically.

## 2020-01-25 NOTE — Patient Instructions (Signed)
Handouts given for Barrett's esophagus, polyps and diverticulosis.  RESUME PLAVIX at prior dose Tomorrow, Friday,  01/26/20.  YOU HAD AN ENDOSCOPIC PROCEDURE TODAY AT [name]:   Refer to the procedure report that was given to you for any specific questions about what was found during the examination.  If the procedure report does not answer your questions, please call your gastroenterologist to clarify.  If you requested that your care partner not be given the details of your procedure findings, then the procedure report has been included in a sealed envelope for you to review at your convenience later.  YOU SHOULD EXPECT: Some feelings of bloating in the abdomen. Passage of more gas than usual.  Walking can help get rid of the air that was put into your GI tract during the procedure and reduce the bloating. If you had a lower endoscopy (such as a colonoscopy or flexible sigmoidoscopy) you may notice spotting of blood in your stool or on the toilet paper. If you underwent a bowel prep for your procedure, you may not have a normal bowel movement for a few days.  Please Note:  You might notice some irritation and congestion in your nose or some drainage.  This is from the oxygen used during your procedure.  There is no need for concern and it should clear up in a day or so.  SYMPTOMS TO REPORT IMMEDIATELY:   Following lower endoscopy (colonoscopy or flexible sigmoidoscopy):  Excessive amounts of blood in the stool  Significant tenderness or worsening of abdominal pains  Swelling of the abdomen that is new, acute  Fever of 100F or higher   Following upper endoscopy (EGD)  Vomiting of blood or coffee ground material  New chest pain or pain under the shoulder blades  Painful or persistently difficult swallowing  New shortness of breath  Fever of 100F or higher  Black, tarry-looking stools  For urgent or emergent issues, a gastroenterologist can be reached at any hour by  calling [phone]. Do not use MyChart messaging for urgent concerns.    DIET:  We do recommend a small meal at first, but then you may proceed to your regular diet.  Drink plenty of fluids but you should avoid alcoholic beverages for 24 hours.  ACTIVITY:  You should plan to take it easy for the rest of today and you should NOT DRIVE or use heavy machinery until tomorrow (because of the sedation medicines used during the test).    FOLLOW UP: Our staff will call the number listed on your records 48-72 hours following your procedure to check on you and address any questions or concerns that you may have regarding the information given to you following your procedure. If we do not reach you, we will leave a message.  We will attempt to reach you two times.  During this call, we will ask if you have developed any symptoms of COVID 19. If you develop any symptoms (ie: fever, flu-like symptoms, shortness of breath, cough etc.) before then, please call [phone].  If you test positive for Covid 19 in the 2 weeks post procedure, please call and report this information to [place].    If any biopsies were taken you will be contacted by phone or by letter within the next 1-3 weeks.  Please call us at [phone] if you have not heard about the biopsies in 3 weeks.    SIGNATURES/CONFIDENTIALITY: You and/or your care partner have signed paperwork which will be  entered into your electronic medical record.  These signatures attest to the fact that that the information above on your After Visit Summary has been reviewed and is understood.  Full responsibility of the confidentiality of this discharge information lies with you and/or your care-partner. 

## 2020-01-29 ENCOUNTER — Telehealth: Payer: Self-pay

## 2020-01-29 ENCOUNTER — Telehealth: Payer: Self-pay | Admitting: *Deleted

## 2020-01-29 NOTE — Telephone Encounter (Signed)
  Follow up Call-  Call back number 01/25/2020  Post procedure Call Back phone  # [phone]  Permission to leave phone message Yes  Some recent data might be hidden     No answer at # given.  LM on VM.

## 2020-01-29 NOTE — Telephone Encounter (Signed)
  Follow up Call-  Call back number 01/25/2020  Post procedure Call Back phone  # [phone]  Permission to leave phone message Yes  Some recent data might be hidden     Patient questions:  Do you have a fever, pain , or abdominal swelling? No. Pain Score  0 *  Have you tolerated food without any problems? Yes.    Have you been able to return to your normal activities? Yes.    Do you have any questions about your discharge instructions: Diet   No. Medications  No. Follow up visit  No.  Do you have questions or concerns about your Care? No.  Actions: * If pain score is 4 or above: No action needed, pain <4.  1. Have you developed a fever since your procedure? no  2.   Have you had an respiratory symptoms (SOB or cough) since your procedure? no  3.   Have you tested positive for COVID 19 since your procedure no  4.   Have you had any family members/close contacts diagnosed with the COVID 19 since your procedure?  no   If yes to any of these questions please route to Joylene John, RN and Erenest Rasher, RN

## 2020-02-01 ENCOUNTER — Encounter: Payer: Self-pay | Admitting: Gastroenterology

## 2020-04-12 ENCOUNTER — Encounter: Payer: Self-pay | Admitting: Cardiology

## 2020-04-12 ENCOUNTER — Ambulatory Visit (INDEPENDENT_AMBULATORY_CARE_PROVIDER_SITE_OTHER): Payer: Medicare Other | Admitting: Cardiology

## 2020-04-12 ENCOUNTER — Other Ambulatory Visit: Payer: Self-pay

## 2020-04-12 VITALS — BP 126/68 | HR 63 | Ht 71.0 in | Wt 199.6 lb

## 2020-04-12 DIAGNOSIS — R55 Syncope and collapse: Secondary | ICD-10-CM | POA: Diagnosis not present

## 2020-04-12 DIAGNOSIS — R000 Tachycardia, unspecified: Secondary | ICD-10-CM

## 2020-04-12 DIAGNOSIS — E785 Hyperlipidemia, unspecified: Secondary | ICD-10-CM

## 2020-04-12 DIAGNOSIS — Z955 Presence of coronary angioplasty implant and graft: Secondary | ICD-10-CM | POA: Diagnosis not present

## 2020-04-12 DIAGNOSIS — I2510 Atherosclerotic heart disease of native coronary artery without angina pectoris: Secondary | ICD-10-CM

## 2020-04-12 DIAGNOSIS — I4729 Other ventricular tachycardia: Secondary | ICD-10-CM

## 2020-04-12 DIAGNOSIS — R002 Palpitations: Secondary | ICD-10-CM

## 2020-04-12 DIAGNOSIS — I472 Ventricular tachycardia: Secondary | ICD-10-CM

## 2020-04-12 DIAGNOSIS — I952 Hypotension due to drugs: Secondary | ICD-10-CM

## 2020-04-12 DIAGNOSIS — R6889 Other general symptoms and signs: Secondary | ICD-10-CM

## 2020-04-12 NOTE — Patient Instructions (Addendum)

## 2020-04-12 NOTE — Progress Notes (Signed)
3.  Primary Care Provider: [name], MD Cardiologist: [name], MD Electrophysiologist:   Clinic Note: Chief Complaint  Patient presents with  . Follow-up    8 months  . Coronary Artery Disease    No angina  . Irregular Heart Beat    Stable on current medicines (Wide-Complex Tachycardia)     HPI:    Alexander Armstrong is a 79 y.o. male with a history of CAD-LAD and RCA PCI described below who presents today for 41-monthfollow-up.   Anterior STEMI January 2006 (per pt - never truly had CP - mostly dyspnea):   LAD PCI, staged PCI RCA  Documented WIDE-COMPLEX TACHYCARDIA on loop recorder for evaluation of syncope (end of life of second recorder) -> initially maintained on flecainide d/c 2/2  H/O CAD   May 2020, showed intolerance of both mexiletine and amiodarone --> noted improvement of "strange palpitation / dizziness Sx previously noted & felt to to be arrhythmia mediated --> Noted easier fatigue (tiring) with less activity.  Now on low-dose diltiazem only. ->  Notably improved symptoms with no recurrent arrhythmia.  [facility]  12/2016, Myoview -> SIGNIFICANT ANTERIOR DEFECT WITH REVERSIBILITY ->   CATH found severe in-stent and prestent stenosis of LAD stent and patent RCA stent --> DES PCI April 2018  04/24/2019, Myoview: Normal EF.  Very small size, mild intensity fixed anteroapical/septal defect-likely representing old anterior infarct.  No ischemia.  LOW RISK   CCarlee Tesfayewas last seen on August 22, 2019 indicated he is feeling well.  Feeling much better no longer on mexiletine.  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  Much less fatigue.  Energy level definitely improved.  Pretty much back to baseline, including no significant palpitations or fluttering episodes.  Nothing more than a few skipped beats.  Recent Hospitalizations: None  Reviewed  CV studies:    The following  studies were reviewed today: (if available, images/films reviewed: From Epic Chart or Care Everywhere) . Myoview 04/05/2019: EF 55 to 65%.  Small size, mild severity fixed perfusion defect in the mid-apical anterior septal wall.  Likely represents old anterior infarct.   No evidence of ischemia. LOW RISK.   Interval History:   CMayes Sangiovannireturns here today for ~64-monthollow-up still feeling great.  He is doing well with no complaints.  Again only short little bursts of palpitations noted, but nothing prolonged.  None of his issues with fatigue and and low energy.  He actually just completed a significant order of conference room chairs [place] the [place] his largest order in over 10 Years.  He has not yet had his Covid vaccine.  CV Review of Symptoms (Summary)  no chest pain or dyspnea on exertion positive for - Intermittent short bursts of palpitations (not associated with syncope or near syncope) negative for - edema, loss of consciousness, orthopnea, paroxysmal nocturnal dyspnea, shortness of breath or TIA/amaurosis fugax, claudication  The patient does not have symptoms concerning for COVID-19 infection (fever, chills, cough, or new shortness of breath).  The patient is practicing social distancing and masking as necessary. He does go out as needed for groceries/shopping.  He does work in his shop essentially alone and therefore is safe with distancing.  He and his wife Alexander Latinotill keeping in touch with his family and not feeling isolated -> however, he is still not convinced that he should get  his vaccine -> not convinced that it is worth potential risk.Alexander Armstrong   REVIEWED OF SYSTEMS   A comprehensive ROS was performed. Review of Systems  Constitutional: Negative for malaise/fatigue (Energy is much better) and weight loss (His weight is now plateaued.).  HENT: Negative for nosebleeds.   Respiratory: Negative for cough and wheezing.     Cardiovascular: Negative for leg swelling.  Gastrointestinal: Negative for blood in stool, heartburn, melena and nausea.  Genitourinary: Negative for hematuria.  Musculoskeletal: Positive for joint pain (Mild arthritis pains ). Negative for falls and myalgias.  Neurological: Positive for dizziness (Mild orthostatic dizziness; notably improved balance). Negative for focal weakness and weakness.  Endo/Heme/Allergies: Positive for environmental allergies. Does not bruise/bleed easily.  Psychiatric/Behavioral: Negative for depression and memory loss. The patient is not nervous/anxious and does not have insomnia.   All other systems reviewed and are negative.  I have reviewed and (if needed) personally updated the patient's problem list, medications, allergies, past medical and surgical history, social and family history.   PAST MEDICAL HISTORY   Past Medical History:  Diagnosis Date  . Allergy   . Arthritis   . 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 basal cell skin cancer   . CAD S/P percutaneous coronary angioplasty 11/03/2004   a. Ant Stemi (11/03/04) mLAD - PCI: Taxus DES 3.0 x 20; b. (11/06/04) staged RCA DES PCI: Taxus DES 3.5 x 16; c. (01/2017) High Risk Myoview (~fixed anterior defect with normal WM) @ [facility] (Dr. [name]) Synergy DES 3.0 x 12 (for 99% lesion @ prox edge of old stent  . Cardiac syncope    No episode since starting flecainide. Had documented WIDE COMPLEX Tachycardia on loop recorder.  . Cataract bilateral cataracts  . Diverticulosis   . Esophageal stricture   . GERD (gastroesophageal reflux disease)   . Hiatal hernia 11/05/2010  . HLD (hyperlipidemia)   . 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 Unable to induce during EP study.;  Due to CAD, flecainide discontinued -> intolerant of both mexiletine and amiodarone -> Dr. Curt Bears (EP-Cards) d/c'd mexilitine & statrted low dose Diltizem XT; Holter Monitor 12/2018 - HR  47-114, rate PACs/PVCs, no Afib/flutter or SVT, VT.   Alexander Armstrong Reflux gastritis   . STEMI involving left anterior descending coronary artery ([place]) 11/03/2004   [facility]: Occluded LAD treated with DES. Staged PCI to the RCA.     PAST SURGICAL HISTORY   Past Surgical History:  Procedure Laterality Date  . CARDIAC CATHETERIZATION  ~2011   [facility]: re-look Cath - patent stents  . CORONARY STENT INTERVENTION N/A 01/27/2017   Procedure: Coronary Stent Intervention;  Surgeon: [name], MD;  Location: [place] [facility]: mLAD (just after D1) overlapping prior DES -> SYNERGY DES 3X12 drug eluting stent  . ELECTROPHYSIOLOGIC STUDY  2014   Unable to stimulate VT seen on loop recorder  . HOLTER MONITOR  12/2018    (Off of mexiletine, only on diltiazem) heart rate ranged from 47 to 114 bpm.  Rare PACs and PVCs.  Sinus rhythm noted no A. fib or other arrhythmia.  Alexander Armstrong implanted loop heart monitor x2  ~2011; 2014   Per report- batter out of date; apparently captured a prolonged episode of wide complex tachycardia associated with an episode of weakness/near syncope  . INTRAVASCULAR PRESSURE WIRE/FFR STUDY N/A 01/27/2017   Procedure: Intravascular Pressure Wire/FFR Study;  Surgeon: [name], MD;  Location: [place] [facility];  Service:  Cardiovascular;  Laterality: N/A;  . LEFT HEART CATH AND CORONARY ANGIOGRAPHY N/A 01/27/2017   Procedure: Left Heart Cath and Coronary Angiography;  Surgeon: [name], MD;  Location: [facility] INVASIVE [facility]: mLAD 99% stenosis pre-Stent & ISR --> PCI. Ost DI ~50%. pRCA DES ~50-60% FFR 0.86.  Alexander Armstrong NM MYOVIEW LTD  07/2014   Unremarkable EKG. No evidence of ischemia or infarct. Mild anterior attenuation, cannot exclude prior infarct, but nonreversible. EF 66%.  Alexander Armstrong NM MYOVIEW LTD  12/22/2016   INTERMEDIATE RISK. EF 58%. Defect 1: Medium size severe defect in the mid anterior, apical anterior and apical wall consistent with prior anterior MI. Defect to: Large defect  and moderate severity in the basal inferoseptal basal inferior and inferoseptal mid inferior wall consistent with ischemia.  Alexander Armstrong NM MYOVIEW LTD  04/05/2019   EF 55 to 65%.  Small size, mild severity fixed perfusion defect in the mid-apical anterior septal wall.  Likely represents old anterior infarct.   No evidence of ischemia. LOW RISK.   Alexander Armstrong PERCUTANEOUS CORONARY STENT INTERVENTION (PCI-S)  January-February 2006   a. mLAD - Taxus DES 3.0 x 20; b. (11/06/04) staged PCI to RCA Taxus DES 3.5 x 16; c. 01/27/2017: PCI to mLAD prox to old  stent - Synergy DES 3.0 x 12  . UMBILICAL HERNIA REPAIR     Most Recent Cath -> (01/2017). @ [facility] (Dr. [name]) CATH-PCI EXB:MWUXLKG DES 3.0 x 12 (for 99% lesion @ prox edge of old stent    MEDICATIONS/ALLERGIES   Current Meds  Medication Sig  . acetaminophen (TYLENOL) 500 MG tablet Take 1,000 mg by mouth 3 (three) times daily as needed for moderate pain or headache.  . Cholecalciferol (VITAMIN D3) 1000 units CAPS Take 1,000 Units by mouth at bedtime.  . clopidogrel (PLAVIX) 75 MG tablet TAKE 1 TABLET(75 MG) BY MOUTH DAILY  . diltiazem (CARDIZEM CD) 120 MG 24 hr capsule TAKE 1 CAPSULE(120 MG) BY MOUTH DAILY  . oxybutynin (DITROPAN) 5 MG tablet Take 5 mg by mouth daily.  . pantoprazole (PROTONIX) 40 MG tablet Take 40 mg by mouth daily.  . rosuvastatin (CRESTOR) 20 MG tablet Take 20 mg by mouth daily.  . tamsulosin (FLOMAX) 0.4 MG CAPS capsule Take 0.4 mg by mouth daily after supper.     Allergies  Allergen Reactions  . Silver Sulfadiazine Hives and Rash  . Latex Other (See Comments)    "takes the skin off"  . Adhesive [Tape] Rash  . Hydrocodone Rash    SOCIAL HISTORY/FAMILY HISTORY   Social History   Tobacco Use  . Smoking status: Never Smoker  . Smokeless tobacco: Never Used  Vaping Use  . Vaping Use: Never used  Substance Use Topics  . Alcohol use: No  . Drug use: No   Social History   Social History Narrative  . Not on file     Family History family history includes Cancer in his father; Glaucoma in his paternal aunt; Heart attack in his brother; Heart disease in his mother; Macular degeneration in his father; Multiple myeloma in his mother; Prostate cancer in his father; Uterine cancer in his mother.   OBJCTIVE -PE, EKG, labs   Wt Readings from Last 3 Encounters:  04/12/20 199 lb 9.6 oz (90.5 kg)  01/25/20 196 lb (88.9 kg)  12/22/19 196 lb 8 oz (89.1 kg)    Physical Exam: BP 126/68   Pulse 63   Ht 5' 11"  (1.803 m)   Wt 199 lb 9.6 oz (  90.5 kg)   SpO2 95%   BMI 27.84 kg/m  Physical Exam Vitals reviewed.  Constitutional:      General: He is not in acute distress.    Appearance: He is well-developed.     Comments: Well-groomed.  Healthy-appearing.  With exception of white hair, looks younger than stated age  HENT:     Head: Normocephalic and atraumatic.  Eyes:     Extraocular Movements: Extraocular movements intact.  Neck:     Vascular: No carotid bruit, hepatojugular reflux or JVD.  Cardiovascular:     Rate and Rhythm: Normal rate and regular rhythm.  No extrasystoles are present.    Chest Wall: PMI is not displaced.     Pulses: Intact distal pulses.     Heart sounds: Normal heart sounds. No murmur heard.  No friction rub. No gallop.   Pulmonary:     Effort: Pulmonary effort is normal. No respiratory distress.     Breath sounds: Normal breath sounds. No wheezing or rales.  Chest:     Chest wall: No tenderness.  Abdominal:     Comments: No HSM.  Mild truncal obesity  Musculoskeletal:        General: No swelling. Normal range of motion.     Cervical back: Normal range of motion and neck supple.  Neurological:     General: No focal deficit present.     Mental Status: He is oriented to person, place, and time.  Psychiatric:        Mood and Affect: Mood normal.        Behavior: Behavior normal.        Thought Content: Thought content normal.        Judgment: Judgment normal.       Adult ECG Report  Rate: 63;  Rhythm: normal sinus rhythm and 1 degree AVB.  CRO AMI, age-indeterminate; otherwise normal axis, intervals and durations.;   Narrative Interpretation: Relatively normal EKG  Recent Labs:  Labs done November 2020.  (Care Everywhere)    08/22/2019  LDL Direct 74      Total Cholesterol 124      Triglycerides 161High      HDL Cholesterol 37      Total Chol / HDL Cholesterol 3.4      Non-HDL Cholesterol 87       08/22/2019  Sodium 133Low     Potassium 4.2     Chloride 99     CO2 26     BUN 24     Glucose 126High     Creatinine 0.99     Calcium 9.3     Total Protein 8.1     Albumin  4.1     Total Bilirubin 0.7     Alk Phos 63     AST (SGOT) 18     ALT (SGPT) 13    ASSESSMENT/PLAN    Problem List Items Addressed This Visit    Coronary artery disease involving native coronary artery of native heart without angina pectoris - Primary (Chronic)    Major concern with Mr. Alexander Armstrong is that he did not have classic anginal symptoms even with his MI he had PCI to both the LAD and the RCA at that time besides feeling very little more energy and may be little less short of breath, he did not notice much of a difference in symptoms.  His precath Myoview in 2018 showed pretty significant anterior defect almost assist with resting ischemia, and he did not have  angina.  As such, I do think that we probably need to do intermittent surveillance Myoview test -> the notable improvement in the pre-PCI to post PCI Myoview EKG may be a small fixed apical infarct from the original LAD occlusion.  Thankfully, he is doing very well now and tolerating all medications without major issues.  Plan:  On diltiazem (with normal EF, reasonable option of, especially since this is better tolerated  On stable dose of statin.  Continue maintenance dose clopidogrel/Plavix 75 mg daily (no bleeding issues)  Okay to hold clopidogrel 5 to 7 days preop for any  surgeries or procedures.      Relevant Orders   EKG 12-Lead   Cardiac syncope (Chronic)    No further episodes and no tachycardia spells.  As such, we will simply continue current dose of diltiazem.      Wide-complex tachycardia - WCT (HCC) (Chronic)    Thankfully, he has not had a recurrence of these episodes.  Was intolerant of beta-blocker because of hypotension and fatigue.  Was intolerant of amiodarone and mexiletine--they made him feel horrible.  Follow-up Myoview post PCI showed no ischemia, and Holter monitor showed no arrhythmias and rare PACs/PVCs.  Plan: Simply continue current dose of diltiazem      Presence of drug coated stent in LAD (overlapping) & RCA  ARTERIES (Chronic)    With overlapping LAD stents as well as RCA stent, I think is reasonable for her to be on maintenance dose of clopidogrel which he is tolerating well.   Okay to hold clopidogrel 5-7 days preop for any surgeries or procedures.  For significant bruising, okay to hold 3 to 4 days.      Relevant Orders   EKG 12-Lead   Exercise intolerance (Chronic)    He was very reassured by his Myoview last year that he had no active ischemia.  His symptoms did get much better with stopping the beta-blocker and switching to diltiazem.  Now no longer noting exercise intolerance or dyspnea/chest discomfort.      Hyperlipidemia LDL goal <70 (Chronic)    Lipids were last checked in November 2020 and they were outstanding.  Triglycerides only just a little bit above normal.  Continue current dose of rosuvastatin.  He seems to be tolerating it okay although he does have some leg fatigue  If this continues, we can consider a 2 to 3-week statin holiday to determine if this truly was causing his symptoms.      Hypotension (Chronic)    Was having some orthostatic hypotension issues.  We are therefore not being aggressive with blood pressure control.  He is only on diltiazem at low-dose.  No longer requiring  midodrine.  Ensure adequate hydration, and support stockings for any prolonged sitting.      Palpitations (Chronic)    Very well controlled on diltiazem.  He is really on about his high dose of diltiazem as we can use.  We have in the past tried beta-blocker, amiodarone as well as mexiletine-all of which were not tolerated      Paroxysmal VT (HCC) (Chronic)    Presumably VT-however no further episodes documented-> now only on diltiazem CD for WCT       Relevant Orders   EKG 12-Lead      COVID-19 Education: The signs and symptoms of COVID-19 were discussed with the patient and how to seek care for testing (follow up with PCP or arrange E-visit).   The importance of social distancing was discussed today.  I spent a total of 47mnutes with the patient and chart review. >  50% of the time was spent in direct patient consultation.  Additional time spent with chart review (studies, outside notes, etc): 6 Total Time: 22 min   Current medicines are reviewed at length with the patient today.  (+/- concerns) none   Patient Instructions / Medication Changes & Studies & Tests Ordered   Patient Instructions  Medication Instructions:  No changes *If you need a refill on your cardiac medications before your next appointment, please call your pharmacy*   Lab Work: Not needed   Testing/Procedures: Not needed   Follow-Up: [facility] and your [facility] needs are our priority.  As part of our continuing mission to provide you with exceptional heart care, we have created designated Provider Care Teams.  These Care Teams include your primary Cardiologist (physician) and Advanced Practice Providers (APPs -  Physician Assistants and Nurse Practitioners) who all work together to provide you with the care you need, when you need it.    Your next appointment:   12 month(s)  The format for your next appointment:   In Person  Provider:   DGlenetta Hew MD     Studies  Ordered:   Orders Placed This Encounter  Procedures  . EKG 12-Lead     DGlenetta Hew M.D., M.S. Interventional Cardiologist   Pager # [phone] # [phone] [place] [id number]  [facility]

## 2020-04-14 ENCOUNTER — Encounter: Payer: Self-pay | Admitting: Cardiology

## 2020-04-14 NOTE — Assessment & Plan Note (Signed)
No further episodes and no tachycardia spells.  As such, we will simply continue current dose of diltiazem.

## 2020-04-14 NOTE — Assessment & Plan Note (Signed)
He was very reassured by his Myoview last year that he had no active ischemia.  His symptoms did get much better with stopping the beta-blocker and switching to diltiazem.  Now no longer noting exercise intolerance or dyspnea/chest discomfort.

## 2020-04-14 NOTE — Assessment & Plan Note (Signed)
With overlapping LAD stents as well as RCA stent, I think is reasonable for her to be on maintenance dose of clopidogrel which he is tolerating well.   Okay to hold clopidogrel 5-7 days preop for any surgeries or procedures.  For significant bruising, okay to hold 3 to 4 days.

## 2020-04-14 NOTE — Assessment & Plan Note (Signed)
Very well controlled on diltiazem.  He is really on about his high dose of diltiazem as we can use.  We have in the past tried beta-blocker, amiodarone as well as mexiletine-all of which were not tolerated

## 2020-04-14 NOTE — Assessment & Plan Note (Signed)
Was having some orthostatic hypotension issues.  We are therefore not being aggressive with blood pressure control.  He is only on diltiazem at low-dose.  No longer requiring midodrine.  Ensure adequate hydration, and support stockings for any prolonged sitting.

## 2020-04-14 NOTE — Assessment & Plan Note (Signed)
Presumably VT-however no further episodes documented-> now only on diltiazem CD for [employer]

## 2020-04-14 NOTE — Assessment & Plan Note (Signed)
Lipids were last checked in November 2020 and they were outstanding.  Triglycerides only just a little bit above normal.  Continue current dose of rosuvastatin.  He seems to be tolerating it okay although he does have some leg fatigue  If this continues, we can consider a 2 to 3-week statin holiday to determine if this truly was causing his symptoms.

## 2020-04-14 NOTE — Assessment & Plan Note (Signed)
Thankfully, he has not had a recurrence of these episodes.  Was intolerant of beta-blocker because of hypotension and fatigue.  Was intolerant of amiodarone and mexiletine--they made him feel horrible.  Follow-up Myoview post PCI showed no ischemia, and Holter monitor showed no arrhythmias and rare PACs/PVCs.  Plan: Simply continue current dose of diltiazem

## 2020-04-14 NOTE — Assessment & Plan Note (Signed)
Major concern with Alexander Armstrong is that he did not have classic anginal symptoms even with his MI he had PCI to both the LAD and the RCA at that time besides feeling very little more energy and may be little less short of breath, he did not notice much of a difference in symptoms.  His precath Myoview in 2018 showed pretty significant anterior defect almost assist with resting ischemia, and he did not have angina.  As such, I do think that we probably need to do intermittent surveillance Myoview test -> the notable improvement in the pre-PCI to post PCI Myoview EKG may be a small fixed apical infarct from the original LAD occlusion.  Thankfully, he is doing very well now and tolerating all medications without major issues.  Plan:  On diltiazem (with normal EF, reasonable option of, especially since this is better tolerated  On stable dose of statin.  Continue maintenance dose clopidogrel/Plavix 75 mg daily (no bleeding issues)  Okay to hold clopidogrel 5 to 7 days preop for any surgeries or procedures.

## 2020-05-05 HISTORY — PX: TRANSTHORACIC ECHOCARDIOGRAM: SHX275

## 2020-05-17 DIAGNOSIS — E871 Hypo-osmolality and hyponatremia: Secondary | ICD-10-CM | POA: Insufficient documentation

## 2020-05-17 DIAGNOSIS — R531 Weakness: Secondary | ICD-10-CM | POA: Insufficient documentation

## 2020-05-17 DIAGNOSIS — D696 Thrombocytopenia, unspecified: Secondary | ICD-10-CM | POA: Insufficient documentation

## 2020-05-30 ENCOUNTER — Telehealth: Payer: Self-pay | Admitting: Cardiology

## 2020-05-30 NOTE — Telephone Encounter (Signed)
Spoke to Alexander Armstrong who report he is currently admitted to [facility] with COVID and wondering if Dr. [name] could consult with the physicians there is regards to his care. He voiced he takes pride in Dr. Allison Quarry advice and would love to have is recommendations.  Will route to MD.

## 2020-05-30 NOTE — Telephone Encounter (Signed)
Pt said he is currently admitted to the hospital with [place]. He had some questions he wanted to ask Dr. [name] about what to expect. Please call his hospital room # at [phone]

## 2020-05-31 NOTE — Telephone Encounter (Signed)
Called home number , spoke with Mrs Georgiou, patient wife.  she states patient has been in the hospital @ 14 days now. She and patient has been covid +. Wife has not hospitalized.  RN informed wife that  Dr [name] would not be able to consult while patient is in another hospital.  Once discharge if needed patient will be given an appointment in about 3 weeks from discharge. If  Physician at current hospital  Needs to consult the can given [facility] a call    Wife verbalized understanding.

## 2020-05-31 NOTE — Telephone Encounter (Signed)
No answer  , will try home phone

## 2020-06-06 ENCOUNTER — Telehealth: Payer: Self-pay | Admitting: Cardiology

## 2020-06-06 NOTE — Telephone Encounter (Signed)
Patient states he is admitted at [place] and the doctor who is seeing him is suggesting a pacemaker. He states he would like to consult with our office prior to making a decision. Please advise.

## 2020-06-06 NOTE — Telephone Encounter (Signed)
Currently admitted to [facility] w/ Covid. He reports that they are no longer considering PPM implant, states they came and told him this afternoon. Pt will call back if they reconsider b/c he would like to discuss w/ his heart doctors before anything is done. Pt appreciates my follow up.

## 2020-08-02 ENCOUNTER — Encounter: Payer: Self-pay | Admitting: *Deleted

## 2020-08-02 ENCOUNTER — Encounter: Payer: Self-pay | Admitting: Cardiology

## 2020-08-02 ENCOUNTER — Telehealth (INDEPENDENT_AMBULATORY_CARE_PROVIDER_SITE_OTHER): Payer: Medicare Other | Admitting: Cardiology

## 2020-08-02 VITALS — BP 117/60 | HR 79 | Ht 71.0 in | Wt 185.0 lb

## 2020-08-02 DIAGNOSIS — I4729 Other ventricular tachycardia: Secondary | ICD-10-CM

## 2020-08-02 DIAGNOSIS — Z955 Presence of coronary angioplasty implant and graft: Secondary | ICD-10-CM

## 2020-08-02 DIAGNOSIS — I9589 Other hypotension: Secondary | ICD-10-CM

## 2020-08-02 DIAGNOSIS — I472 Ventricular tachycardia: Secondary | ICD-10-CM | POA: Diagnosis not present

## 2020-08-02 DIAGNOSIS — E785 Hyperlipidemia, unspecified: Secondary | ICD-10-CM | POA: Diagnosis not present

## 2020-08-02 DIAGNOSIS — R002 Palpitations: Secondary | ICD-10-CM

## 2020-08-02 DIAGNOSIS — I2510 Atherosclerotic heart disease of native coronary artery without angina pectoris: Secondary | ICD-10-CM

## 2020-08-02 MED ORDER — FLUDROCORTISONE ACETATE 0.1 MG PO TABS: 100.0000 ug | ORAL_TABLET | ORAL | 3 refills | Status: DC | PRN

## 2020-08-02 NOTE — Assessment & Plan Note (Signed)
His lipids showed an LDL of 73 while in the hospital.  Pretty much at goal.  Continue current dose of Crestor.

## 2020-08-02 NOTE — Assessment & Plan Note (Signed)
Is been a long time since he has had any of these episodes documented.  No episodes while he was in the hospital.  Has been on diltiazem CD for baseline management.  Currently this is on hold because of hypotension.  We can reassess in a few months.  Did not tolerate full antiarrhythmics or beta-blocker.

## 2020-08-02 NOTE — Assessment & Plan Note (Signed)
He is no longer on Florinef, but has had significant orthostatic hypotension issues.  Midodrine did not work in the hospital.  He was aggressively hydrated while in the hospital.  We again talked of the importance of adequate hydration and support stockings.  He seems to be maintaining his pressures now, but I will continue to have his diltiazem on hold. Also with the knowledge that he responded well to Florinef, would probably choose Florinef over midodrine as an as needed medicine for symptomatic hypotension.

## 2020-08-02 NOTE — Progress Notes (Signed)
Virtual Visit via Telephone Note   This visit type was conducted due to national recommendations for restrictions regarding the COVID-19 Pandemic (e.g. social distancing) in an effort to limit this patient's exposure and mitigate transmission in our community.  Due to his co-morbid illnesses, this patient is at least at moderate risk for complications without adequate follow up.  This format is felt to be most appropriate for this patient at this time.  The patient did not have access to video technology/had technical difficulties with video requiring transitioning to audio format only (telephone).  All issues noted in this document were discussed and addressed.  No physical exam could be performed with this format.  Please refer to the patient's chart for his  consent to telehealth for [facility].   Patient has given verbal permission to conduct this visit via virtual appointment and to bill insurance 08/02/2020 6:27 PM     Evaluation Performed:  Follow-up visit  Date:  08/02/2020   ID:  Alexander Armstrong Done, DOB Dec 03, 1940, MRN [id number]  Patient Location: Home Provider Location: Office/Clinic  PCP:  [name], MD  Cardiologist:  [name], MD  Electrophysiologist:  Constance Haw, MD   Chief Complaint:   Chief Complaint  Patient presents with  . Hospitalization Follow-up    Prolonged hospitalization with [id number] pneumonia complicated by severe orthostatic hypotension and hyponatremia. Had prolonged high flow oxygen. Discharged after 4 weeks to a 5-week outpatient rehab  . Hypotension    She has had a history of orthostatic hypotension. Had severe hypotension in the hospital, was on Florinef until 2 days ago.  . Coronary Artery Disease    No angina  . Palpitations    Diltiazem currently on hold    History of Present Illness:    Jeffrey Graefe is a 79 y.o. 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 LAD PCI, staged PCI RCA ? Documented WIDE-COMPLEX TACHYCARDIA on loop recorder for evaluation of syncope (end of life of second recorder) -> initially maintained on flecainide d/c 2/2  H/O CAD   May 2020, showed intolerance of both mexiletine and amiodarone --> noted improvement of "strange palpitation / dizziness Sx previously noted & felt to to be arrhythmia mediated --> Noted easier fatigue (tiring) with less activity.  Now on low-dose diltiazem only. ->  Notably improved symptoms with no recurrent arrhythmia.  [facility]--Baseline Myoview ordered ? 12/2016, Myoview -> SIGNIFICANT ANTERIOR DEFECT WITH REVERSIBILITY ->   CATH found severe in-stent and prestent stenosis of LAD stent and patent RCA stent --> DES PCI April 2018 ? 04/2019, Myoview: Normal EF.  Very small size, mild intensity fixed anteroapical/septal defect-likely representing old anterior infarct.  No ischemia.  LOW RISK   CYobany Vroomwas last seen on April 12, 2020 in follow-up from a Myoview stress test that was read as low risk.  This is a routine 67-monthollow-up and he was feeling well no complaints.  Maybe short little bursts of palpitations but nothing prolonged.  Fatigue and low energy level is notably improved. (Did not get the COVID-19 vaccine)  Hospitalizations:   [facility]12[facility]07/[facility] and symptomatic orthostatic hypotension (discharged on Florinef after failing midodrine),  Was hypotensive during the hospital stay and still orthostatic prior to discharge.  Noted to be pancytopenic (unrelated bone marrow suppression).   Treated with supplemental oxygen and remdesivir with dexamethasone.  Transferred to ICU on 8/21 for  worsening oxygen requirement, placed on OptiFlow for several days.  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  Treated with hypertonic saline  Diltiazem was placed on hold  -- Discharged to [facility] for ~5 weeks  - home 2 weeks ago.   Saw PCP 07/25/2020  Recent - Interim CV studies:   The following studies were reviewed today: . TTE 05/21/2020: Normal LV size and function.  Normal valves.  No effusion. . TTE 06/04/2020: Normal LV size and function EF 60 to 65%.  Trace MR and TR.  No pulmonary hypertension.  Stable . PE Protocol Chest CTA 05/22/2020:  No PE. Marked severe bilateral infiltrates. Small hiatal hernia. [name] MRI Angio Brain 06/05/2020 MRI brain:  1. The patient was unable to tolerate the full examination. As result, the routine axial SWI and coronal T2 weighted sequences  could not be obtained.  2. No evidence of acute intracranial abnormality, including acute infarction.  3. Mild chronic small vessel ischemic changes within the cerebral white matter.  4. Mild ethmoid sinus mucosal thickening.   MRA head:  1. Mildly motion degraded examination.  2. No intracranial large vessel occlusion or proximal high-grade arterial stenosis.  3. 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    Inerval History   Jewel Venditto presents here today for follow-up after his prolonged hospital stay. He is gradually getting back to normal. He says his blood pressure is now are trending up more to his normal baseline which is to say that he has had a high blood pressure of 130/70 and low-dose in the 105/60 5 mmHg range. Average pressures been more like 110s/60s.  He says he is not nearly as "wobbly" as he will near the end of his hospital stay and in rehab.  Basely the last week in the hospital more because of his orthostatic hypotension.  He was given hypertonic saline and Florinef.  Midodrine did not work.  He stopped Florinef 2 days ago and says  that his pressures been holding steady.  Diltiazem has been on hold of there was question about when they should be started.  He has not had any palpitations thankfully.  He is concerned because of his prior history but also was concerned about orthostatic hypotension.  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  Still quite fatigued and worn out.  He feels almost malnourished.  Appetite is going back in his taste and smell is improving.  Cardiovascular ROS: positive for - dyspnea on exertion and still a bit SOB & fatigued - but steadily better negative for - chest pain, edema, irregular heartbeat, orthopnea, palpitations, paroxysmal nocturnal dyspnea, rapid heart rate, shortness of breath or syncope/near syncope; TIA/amaurosis fugax; claudication  ROS:  Please see the history of present illness.    The patient does not have symptoms concerning for COVID-19 infection (fever, chills, cough, or new shortness of breath). -> He has just recovered from a prolonged hospitalization with COVID-19. Is waiting for his waiting for his intrinsic immunity to wain prior to getting Covid vaccines. He had actually decided to get his vaccines only to find out that he had  tested positive for Covid.  Review of Systems  Constitutional: Positive for malaise/fatigue (Gradually getting his energy back, but still is quite deconditioned. Still very "wobbly ") and weight loss (3 significant weight loss while in the hospital.). Negative for chills and fever.  HENT: Negative for congestion and nosebleeds.   Respiratory: Negative for shortness of breath (He is pretty much back to baseline breathing now.).   Cardiovascular: Negative for claudication and leg swelling.  Gastrointestinal: Negative for blood in stool and melena.  Genitourinary: Negative for hematuria.  Musculoskeletal: Negative for falls (Still quite unsteady, but no  falls) and joint pain.  Neurological: Positive for dizziness (Not as much dizzy but poor balance) and weakness (Generalized weakness-balance is very poor.). Negative for headaches.  Psychiatric/Behavioral: Negative for memory loss. The patient is not nervous/anxious and does not have insomnia.    The patient is practicing social distancing.  Past Medical History:  Diagnosis Date  . Allergy   . Arthritis   . 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 basal cell skin cancer   . CAD S/P percutaneous coronary angioplasty 11/03/2004   a. Ant Stemi (11/03/04) mLAD - PCI: Taxus DES 3.0 x 20; b. (11/06/04) staged RCA DES PCI: Taxus DES 3.5 x 16; c. (01/2017) High Risk Myoview (~fixed anterior defect with normal WM) @ [facility] (Dr. [name]) Synergy DES 3.0 x 12 (for 99% lesion @ prox edge of old stent  . Cardiac syncope    No episode since starting flecainide. Had documented WIDE COMPLEX Tachycardia on loop recorder.  . Cataract bilateral cataracts  . Diverticulosis   . Esophageal stricture   . GERD (gastroesophageal reflux disease)   . Hiatal hernia 11/05/2010  . HLD (hyperlipidemia)   . 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 Unable to induce during EP study.;  Due to CAD, flecainide discontinued -> intolerant of both mexiletine and amiodarone -> Dr. Curt Bears (EP-Cards) d/c'd mexilitine & statrted low dose Diltizem XT; Holter Monitor 12/2018 - HR 47-114, rate PACs/PVCs, no Afib/flutter or SVT, VT.   [name] Reflux gastritis   . STEMI involving left anterior descending coronary artery ([place]) 11/03/2004   [facility]: Occluded LAD treated with DES. Staged PCI to the RCA.    Past Surgical History:  Procedure Laterality Date  . CARDIAC CATHETERIZATION  ~2011   [facility]: re-look Cath - patent stents  . CORONARY STENT INTERVENTION N/A 01/27/2017   Procedure: Coronary Stent Intervention;  Surgeon: [name], MD;  Location: [place] [facility] LAB:  mLAD (just after D1) overlapping prior DES -> SYNERGY DES 3X12 drug eluting stent  . ELECTROPHYSIOLOGIC STUDY  2014   Unable to stimulate VT seen on loop recorder  . HOLTER MONITOR  12/2018    (Off of mexiletine, only on diltiazem) heart rate ranged from 47 to 114 bpm.  Rare PACs and PVCs.  Sinus rhythm noted no A. fib or other arrhythmia.  [name] implanted loop heart monitor x2  ~2011; 2014   Per report- batter out of date; apparently captured a prolonged episode of wide complex tachycardia associated with an episode of weakness/near syncope  . INTRAVASCULAR PRESSURE WIRE/FFR STUDY N/A 01/27/2017   Procedure: Intravascular Pressure Wire/FFR Study;  Surgeon: [name], MD;  Location: [place] CV LAB;  Service: Cardiovascular;  Laterality: N/A;  . LEFT HEART CATH AND CORONARY ANGIOGRAPHY N/A 01/27/2017   Procedure: Left Heart Cath and Coronary Angiography;  Surgeon: [name], MD;  Location: [place] [facility]  LAB: mLAD 99% stenosis pre-Stent & ISR --> PCI. Ost DI ~50%. pRCA DES ~50-60% FFR 0.86.  [name] NM MYOVIEW LTD  07/2014   Unremarkable EKG. No evidence of ischemia or infarct. Mild anterior attenuation, cannot exclude prior infarct, but nonreversible. EF 66%.  [name] NM MYOVIEW LTD  12/22/2016   INTERMEDIATE RISK. EF 58%. Defect 1: Medium size severe defect in the mid anterior, apical anterior and apical wall consistent with prior anterior MI. Defect to: Large defect and moderate severity in the basal inferoseptal basal inferior and inferoseptal mid inferior wall consistent with ischemia.  [name] NM MYOVIEW LTD  04/05/2019   EF 55 to 65%.  Small size, mild severity fixed perfusion defect in the mid-apical anterior septal wall.  Likely represents old anterior infarct.   No evidence of ischemia. LOW RISK.   [name] PERCUTANEOUS CORONARY STENT INTERVENTION (PCI-S)  January-February 2006   a. mLAD - Taxus DES 3.0 x 20; b. (11/06/04) staged PCI to RCA Taxus DES 3.5 x 16; c. 01/27/2017: PCI to mLAD prox to old  stent -  Synergy DES 3.0 x 12  . UMBILICAL HERNIA REPAIR       Current Meds  Medication Sig  . acetaminophen (TYLENOL) 500 MG tablet Take 1,000 mg by mouth 3 (three) times daily as needed for moderate pain or headache.  . Cholecalciferol (VITAMIN D3) 1000 units CAPS Take 1,000 Units by mouth at bedtime.  . clopidogrel (PLAVIX) 75 MG tablet TAKE 1 TABLET(75 MG) BY MOUTH DAILY  . pantoprazole (PROTONIX) 40 MG tablet Take 40 mg by mouth daily.  . rosuvastatin (CRESTOR) 20 MG tablet Take 20 mg by mouth daily.     Allergies:   Silver sulfadiazine, Latex, Adhesive [tape], and Hydrocodone   Social History   Tobacco Use  . Smoking status: Never Smoker  . Smokeless tobacco: Never Used  Vaping Use  . Vaping Use: Never used  Substance Use Topics  . Alcohol use: No  . Drug use: No     Family Hx: The patient's family history includes Cancer in his father; Glaucoma in his paternal aunt; Heart attack in his brother; Heart disease in his mother; Macular degeneration in his father; Multiple myeloma in his mother; Prostate cancer in his father; Uterine cancer in his mother. There is no history of Colon cancer, Esophageal cancer, Rectal cancer, or Stomach cancer.   Labs/Other Tests and Data Reviewed:    EKG:  No ECG reviewed.  Recent Labs:  07/25/2020  Na+ 140, K+ 4.5, Cl- 105, HCO3- 30 , BUN 11, Cr 0.85, Glu 125, Ca2+ 9.6; AST 19, ALT 10, AlkP 74  CBC: W 7.7, H/H 12.1/36.1, Plt 213  SARS-COV-2 Spike AB,Interp,S --+; Quant,S   Recent Lipid Panel  TC 138, TG 194, HDL 32, LDL 73  Wt Readings from Last 3 Encounters:  08/02/20 185 lb (83.9 kg)  04/12/20 199 lb 9.6 oz (90.5 kg)  01/25/20 196 lb (88.9 kg)     Objective:    Vital Signs:  BP 117/60   Pulse 79   Ht 5' 11"  (1.803 m)   Wt 185 lb (83.9 kg)   BMI 25.80 kg/m   Pleasant male in no acute distress. A&O x 3.  normal Mood & Affect Non-labored respirations   ASSESSMENT & PLAN:    Problem List Items Addressed This Visit     Coronary artery disease involving native coronary artery of native heart without angina pectoris - Primary (Chronic)    Mr. Pella is an unusual patient  and that he had a severe LAD lesion with no angina.  He does not remember what symptoms he had with his MI, but my first met him we did baseline surveillance Myoview but had severe anterior ischemia and ended up with RCA and LAD stents.  Despite having "rest ischemia "on the Myoview he had no angina.  Thankfully, he tolerated prolonged hospital stay with hypotension and hypoxia in the setting of COVID-19 infection without any change in his EF.  2 echocardiograms done were both relatively normal.  I have thought about potentially doing a surveillance Myoview which I think he had the equivalent of a stress test during his hospital stay therefore we can hold off for now.  Plan:  Continue to hold diltiazem for now based on his recent severe orthostatic hypotension.  Reassess in roughly 3 months.  Continue statin and clopidogrel.  No longer on aspirin.  Okay to hold clopidogrel 5 days preop for any surgeries or procedures.      Presence of drug coated stent in LAD (overlapping) & RCA  ARTERIES (Chronic)    He was discharged from the hospital on clopidogrel/Plavix only.  No longer on aspirin which makes sense because of his bruising.  Based on the stent he has, I would like to maintain monotherapy on Plavix over aspirin.   Okay to hold Plavix 5 days preop for surgeries or procedures.  (7 days for spinal procedures)  Also okay to hold 3 to 4 days for significant bruising.      Hyperlipidemia LDL goal <70 (Chronic)    His lipids showed an LDL of 73 while in the hospital.  Pretty much at goal.  Continue current dose of Crestor.      Hypotension (Chronic)    He is no longer on Florinef, but has had significant orthostatic hypotension issues.  Midodrine did not work in the hospital.  He was aggressively hydrated while in the hospital.  We again  talked of the importance of adequate hydration and support stockings.  He seems to be maintaining his pressures now, but I will continue to have his diltiazem on hold. Also with the knowledge that he responded well to Florinef, would probably choose Florinef over midodrine as an as needed medicine for symptomatic hypotension.      Palpitations (Chronic)   Paroxysmal VT (HCC) (Chronic)    Is been a long time since he has had any of these episodes documented.  No episodes while he was in the hospital.  Has been on diltiazem CD for baseline management.  Currently this is on hold because of hypotension.  We can reassess in a few months.  Did not tolerate full antiarrhythmics or beta-blocker.         COVID-19 Education: The signs and symptoms of COVID-19 were discussed with the patient and how to seek care for testing (follow up with PCP or arrange E-visit).   The importance of social distancing was discussed today.  Time:   Today, I have spent 20 minutes with the patient with telehealth technology discussing the above problems.  10 min charting   Medication Adjustments/Labs and Tests Ordered: Current medicines are reviewed at length with the patient today.  Concerns regarding medicines are outlined above.   Patient Instructions  Medication Instructions:  Continue holding Diltiazem for now - we can reassess in ~3-4 months Use Florinef on an as needed basis for dizzy spells or dropping BPs.    *If you need a refill on your cardiac medications before  your next appointment, please call your pharmacy*  Follow-Up: At [facility], you and your health needs are our priority.  As part of our continuing mission to provide you with exceptional heart care, we have created designated Provider Care Teams.  These Care Teams include your primary Cardiologist (physician) and Advanced Practice Providers (APPs -  Physician Assistants and Nurse Practitioners) who all work together to provide you with  the care you need, when you need it.  We recommend signing up for the patient portal called "MyChart".  Sign up information is provided on this After Visit Summary.  MyChart is used to connect with patients for Virtual Visits (Telemedicine).  Patients are able to view lab/test results, encounter notes, upcoming appointments, etc.  Non-urgent messages can be sent to your provider as well.   To learn more about what you can do with MyChart, go to [email or web address].    Your next appointment:   3 month(s)  The format for your next appointment:   in person or virtual  Provider:   [name], MD   Other Instructions Keep an eye on your blood pressures Stay adequately hydrated.      Signed, [name], MD  08/02/2020 6:27 PM    [place]

## 2020-08-02 NOTE — Patient Instructions (Addendum)
Medication Instructions:  Continue holding Diltiazem for now - we can reassess in ~3-4 months Use Florinef on an as needed basis for dizzy spells or dropping BPs.    *If you need a refill on your cardiac medications before your next appointment, please call your pharmacy*  Follow-Up: At [name], you and your health needs are our priority.  As part of our continuing mission to provide you with exceptional heart care, we have created designated Provider Care Teams.  These Care Teams include your primary Cardiologist (physician) and Advanced Practice Providers (APPs -  Physician Assistants and Nurse Practitioners) who all work together to provide you with the care you need, when you need it.  We recommend signing up for the patient portal called "MyChart".  Sign up information is provided on this After Visit Summary.  MyChart is used to connect with patients for Virtual Visits (Telemedicine).  Patients are able to view lab/test results, encounter notes, upcoming appointments, etc.  Non-urgent messages can be sent to your provider as well.   To learn more about what you can do with MyChart, go to [email or web address].    Your next appointment:   3 month(s)  The format for your next appointment:   in person or virtual  Provider:   [name], MD   Other Instructions Keep an eye on your blood pressures Stay adequately hydrated.

## 2020-08-02 NOTE — Assessment & Plan Note (Signed)
He was discharged from the hospital on clopidogrel/Plavix only.  No longer on aspirin which makes sense because of his bruising.  Based on the stent he has, I would like to maintain monotherapy on Plavix over aspirin.   Okay to hold Plavix 5 days preop for surgeries or procedures.  (7 days for spinal procedures)  Also okay to hold 3 to 4 days for significant bruising.

## 2020-08-02 NOTE — Assessment & Plan Note (Signed)
Mr. Beale is an unusual patient and that he had a severe LAD lesion with no angina.  He does not remember what symptoms he had with his MI, but my first met him we did baseline surveillance Myoview but had severe anterior ischemia and ended up with RCA and LAD stents.  Despite having "rest ischemia "on the Myoview he had no angina.  Thankfully, he tolerated prolonged hospital stay with hypotension and hypoxia in the setting of COVID-19 infection without any change in his EF.  2 echocardiograms done were both relatively normal.  I have thought about potentially doing a surveillance Myoview which I think he had the equivalent of a stress test during his hospital stay therefore we can hold off for now.  Plan:  Continue to hold diltiazem for now based on his recent severe orthostatic hypotension.  Reassess in roughly 3 months.  Continue statin and clopidogrel.  No longer on aspirin.  Okay to hold clopidogrel 5 days preop for any surgeries or procedures.

## 2020-10-11 HISTORY — PX: TRANSTHORACIC ECHOCARDIOGRAM: SHX275

## 2020-10-16 DIAGNOSIS — R0689 Other abnormalities of breathing: Secondary | ICD-10-CM | POA: Insufficient documentation

## 2020-11-05 DIAGNOSIS — B9562 Methicillin resistant Staphylococcus aureus infection as the cause of diseases classified elsewhere: Secondary | ICD-10-CM | POA: Insufficient documentation

## 2020-11-05 DIAGNOSIS — J15212 Pneumonia due to Methicillin resistant Staphylococcus aureus: Secondary | ICD-10-CM | POA: Insufficient documentation

## 2020-11-06 ENCOUNTER — Ambulatory Visit (INDEPENDENT_AMBULATORY_CARE_PROVIDER_SITE_OTHER): Payer: Medicare Other | Admitting: Cardiology

## 2020-11-06 ENCOUNTER — Encounter: Payer: Self-pay | Admitting: Cardiology

## 2020-11-06 ENCOUNTER — Other Ambulatory Visit: Payer: Self-pay

## 2020-11-06 ENCOUNTER — Ambulatory Visit (INDEPENDENT_AMBULATORY_CARE_PROVIDER_SITE_OTHER): Payer: Medicare Other

## 2020-11-06 ENCOUNTER — Encounter: Payer: Self-pay | Admitting: Radiology

## 2020-11-06 VITALS — BP 110/56 | HR 90 | Ht 71.0 in | Wt 171.0 lb

## 2020-11-06 DIAGNOSIS — I2510 Atherosclerotic heart disease of native coronary artery without angina pectoris: Secondary | ICD-10-CM

## 2020-11-06 DIAGNOSIS — E785 Hyperlipidemia, unspecified: Secondary | ICD-10-CM

## 2020-11-06 DIAGNOSIS — I472 Ventricular tachycardia: Secondary | ICD-10-CM

## 2020-11-06 DIAGNOSIS — R000 Tachycardia, unspecified: Secondary | ICD-10-CM

## 2020-11-06 DIAGNOSIS — R002 Palpitations: Secondary | ICD-10-CM

## 2020-11-06 DIAGNOSIS — I252 Old myocardial infarction: Secondary | ICD-10-CM

## 2020-11-06 DIAGNOSIS — I9589 Other hypotension: Secondary | ICD-10-CM | POA: Diagnosis not present

## 2020-11-06 DIAGNOSIS — Z955 Presence of coronary angioplasty implant and graft: Secondary | ICD-10-CM

## 2020-11-06 DIAGNOSIS — I4729 Other ventricular tachycardia: Secondary | ICD-10-CM

## 2020-11-06 MED ORDER — DILTIAZEM HCL 60 MG PO TABS: 60.0000 mg | ORAL_TABLET | Freq: Four times a day (QID) | ORAL | 1 refills | Status: DC

## 2020-11-06 NOTE — Progress Notes (Signed)
Enrolled patient for a 14 day Zio XT  monitor to be mailed to patients home  °

## 2020-11-06 NOTE — Progress Notes (Signed)
Primary Care Provider: [name], MD Cardiologist: [name], MD Electrophysiologist: Will [name], MD  Clinic Note: Chief Complaint  Patient presents with  . Follow-up    3 months.  [name] Hospitalization Follow-up    Recent admission for MRSA pneumonia  . Palpitations    Increased palpitations being off of diltiazem.  . Coronary Artery Disease    No angina   ===================================  ASSESSMENT/PLAN   Problem List Items Addressed This Visit    Coronary artery disease involving native coronary artery of native heart without angina pectoris - Primary (Chronic)    Thankfully, he has been through 2 major pulmonary infections, and has not had any untoward effect on the heart.  No cardiomyopathy associated Covid.  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  No anginal symptoms.  He clearly is short of breath with activity, but not unexpectedly.  He is currently on rosuvastatin and more appropriately, I agree that we may want to consider restarting diltiazem-however prefer to avoid restarting a long-acting dose in case he does have some symptoms of orthostatic hypotension.  Plan: Diltiazem 60 mg twice daily--if tolerated after the first month, he can restart his 120 mg XT dose.      Relevant Medications   diltiazem (CARDIZEM) 60 MG tablet   Wide-complex tachycardia - WCT (HCC) (Chronic)    With his tendency to have wide-complex tachycardia now having palpitations, need to exclude any serious arrhythmias.  Plan: 2-week Zio patch monitor. We will start low-dose diltiazem 60 mg twice daily short acting dose.  If tolerated after 1 month we can increase to full 120 mg XT dose.      Relevant Medications   diltiazem (CARDIZEM) 60 MG tablet   Other Relevant Orders   EKG 12-Lead (Completed)   LONG TERM MONITOR (3-14 DAYS)   Presence of drug coated stent in LAD (overlapping) & RCA  ARTERIES (Chronic)    Now on lifelong Plavix.  No aspirin.  Okay to hold Plavix  5 days preop for surgeries or procedures..      Hyperlipidemia LDL goal <70 (Chronic)    Most recent labs showed his cholesterol fairly above goal with an LDL 73.  He has been hovering around 70 with his current dose.  I will give him some time to recover from his recent hospitalizations, we can review assess whether or not we increase his Crestor dose in the future.  For now continue current dose of Crestor.      Relevant Medications   diltiazem (CARDIZEM) 60 MG tablet   Hypotension (Chronic)    No longer on Florinef or midodrine, but still having hypotension.  Blood pressures have been drifting up.  He has lost a significant weight.  B.  Make sure that he is actually eating and hydrating properly.  With him having palpitations, diltiazem, he did closely.  We will start with low-dose short acting diltiazem to avoid a 24-hour pill.      Relevant Medications   diltiazem (CARDIZEM) 60 MG tablet   History of acute anterior wall MI (Chronic)    Despite having anterior STEMI in the past, he has preserved EF on echo with no anterior wall motion abnormality.  There was concern for wide-complex tachycardia in the past unable to tolerate anything other medications other than diltiazem.  Had been on flecainide, but not indicated with anterior MI.[name]  Now with palpitations again, we will restart low-dose diltiazem..      Palpitations (Chronic)    His palpitations been  well controlled on diltiazem in the past.  Now they have come back.  We will need to restart diltiazem, but will start with short acting 60 mg twice daily and titrate up to 100 mg next he is tolerating it well.      Relevant Orders   EKG 12-Lead (Completed)   LONG TERM MONITOR (3-14 DAYS)     ===================================  HPI:    Alexander Armstrong is a 80 y.o. male with a PMH below who presents today for 39-month and essentially hospital follow-up.   Anterior STEMI January 2006(per pt - never truly had CP - mostly  dyspnea):  ? LAD PCI, staged PCI RCA ? DocumentedWIDE-COMPLEX TACHYCARDIAon loop recorder for evaluation of syncope (end of life of second recorder) -> initially maintained on flecainided/c 2/2 H/OCAD   May 2020, showed intolerance of both mexiletine and amiodarone--> noted improvement of "strange palpitation / dizziness Sx previously noted & felt to to be arrhythmia mediated --> Noted easier fatigue (tiring) with less activity.  Now on low-dose diltiazem only.->Notably improved symptoms with no recurrent arrhythmia.  Transfer of care to [facility] in early 2018--Baseline Myoview ordered ? 12/2016,Myoview->SIGNIFICANT ANTERIOR DEFECT WITH REVERSIBILITY->  CATHfound severe in-stent and prestent stenosis of LAD stent and patent RCA stent -->DES PCI April 2018 ? 04/2019, Myoview:Normal EF. Very small size, mild intensity fixed anteroapical/septal defect-likely representing old anterior infarct. No ischemia. LOW RISK   8/12-9/07/2020 Admitted to [facility] with COVID-19 pneumonia-acute hypoxic respiratory failure and symptomatic orthostatic hypotension (discharged on Florinef after failing midodrine), he was treated with remdesivir, prednisone/dexamethasone.  He was also notably pancytopenic and hyponatremic with SIADH.--Diltiazem was held. => Rehab x 5 weeks.  (notable weight loss)   Alexander Armstrong was last seen on August 01, 2020 for a postop for follow-up after prolonged hospitalization and rehab after COVID-19 infection =.  Noted that his BNP was trending up a little bit as high as 130/70 at times, but usually in the low 100s.  He denied palpitations being off of diltiazem.-Concerned about orthostatic hypotension.  Finally getting back into some of his work.  Still very weak and malnourished.  However his appetite was getting better. => Thankfully, no heart failure symptoms were noted and his echocardiogram was stable.  -> Plan was to reassess restarting diltiazem  depending on blood pressures in 3 months.  He was seen by his PCP shortly after this visit.  Blood pressure was 114/59. => He brought 2 pages of blood pressure recordings.  Denies any chest pain or dyspnea.  No PND orthopnea or edema.  No syncope or near syncope.  No palpitations.  Recent Hospitalizations:   Initial clinic visit with PA on September 09, 2020-complains of fever cough sore throat wheezing body aches.  Was coughing up gray-colored phlegm.  Stable oxygen saturation, but next temperature was 103. => Initial evaluation with chest x-ray etc. and not revealing.  Testing for viral illnesses was negative.  Advised of OTC medications.  On follow-up, was treated with doxycycline and prednisone.  Follow-up visit [facility] October 03, 2020 now with a month of chest discomfort with coughing aches and pains as well as fevers.  He was 111/72 with heart rate 67.  He still noted fevers 102.  After treatment, fevers improved a little bit, but his other symptoms lingered including cough and myalgias. => Again given OTC medications including Tessalon Perles and Protonix.  Tests today were negative.  Admitted on January 3-13, 2022 to [facility] =>  presented again with a fever of 104 and significant cough with near syncopal episode.  Was found to have multifocal CAP with sepsis => grew out MRSA bacteremia.  Initial blood cultures on 1/3/5 were positive for MRSA, on 1/7 were negative.  Negative echo.  Was started on Zosyn and vancomycin then converted to Ceftin vancomycin with plans to continue until February 4 (at the start of the week) -> PICC line placed; placed on home O2.  Reviewed  CV studies:    The following studies were reviewed today: (if available, images/films reviewed: From Epic Chart or Care Everywhere)  Echo 10/11/2020 [facility]):  Normal LV size & function. EF 55-60%. NO RWMA. Normal filling parameters.  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  IVC is mildly dilated.  No vegetations noted.  No significant change from last study.   TTE 05/21/2020: Normal LV size and function.  Normal valves.  No effusion.  TTE 06/04/2020: Normal LV size and function EF 60 to 65%.  Trace MR and TR.  No pulmonary hypertension.  Stable   Interval History:   Alexander Armstrong presents here today finally starting to feel little bit better.  He is extremely exhausted and tired.  Has lost lots of weight with his 2 hospitalizations.  Remains on home oxygen.  Very weak, throughout all this he denied any chest tightness or pressure.  No orthopnea or PND.  No edema.  Thankfully this time around he did not have the orthostatic hypotension issues that was noted before.  His pressures have been actually better.  His main concern is symptoms that he is having more frequent palpitations of late.  He had a prolonged spell last night that was relatively worrisome for him.  A little lightheaded with associated but no syncope or near syncope.  CV Review of Symptoms (Summary): positive for - dyspnea on exertion, palpitations, rapid heart rate, shortness of breath and Generalized weakness and fatigue. negative for - chest pain, edema, orthopnea, paroxysmal nocturnal dyspnea or Although has had lightheadedness or dizziness, no syncope or near syncope.  No TIA or amaurosis fugax.  The patient does not have symptoms concerning for COVID-19 infection (fever, chills, cough, or new shortness of breath).   REVIEWED OF SYSTEMS   Review of Systems  Constitutional: Positive for malaise/fatigue (Gradually getting his energy back.  Still quite "whipped ") and weight loss (Significant weight loss with 2 hospitalizations.  Was just starting to regain).  HENT: Negative for congestion and nosebleeds.   Respiratory: Positive for shortness of breath (Still requiring home oxygen.). Negative for cough (Mild residual cough).   Cardiovascular: Negative for leg swelling.  Gastrointestinal:  Negative for blood in stool and melena.  Genitourinary: Negative for hematuria.  Musculoskeletal: Negative for falls and joint pain.       Mild diffuse aches and pains  Neurological: Positive for weakness (Global) and headaches. Negative for dizziness (Still has some dizziness, but overall more stable.).  Psychiatric/Behavioral: Negative for depression and memory loss. The patient is not nervous/anxious and does not have insomnia.    I have reviewed and (if needed) personally updated the patient's problem list, medications, allergies, past medical and surgical history, social and family history.   PAST MEDICAL HISTORY   Past Medical History:  Diagnosis Date  . Allergy   . Arthritis   . 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 basal cell skin cancer   . CAD S/P percutaneous coronary angioplasty 11/03/2004   a. Ant Stemi (11/03/04)  mLAD - PCI: Taxus DES 3.0 x 20; b. (11/06/04) staged RCA DES PCI: Taxus DES 3.5 x 16; c. (01/2017) High Risk Myoview (~fixed anterior defect with normal WM) @ [facility] (Dr. [name]) Synergy DES 3.0 x 12 (for 99% lesion @ prox edge of old stent  . Cardiac syncope    No episode since starting flecainide. Had documented WIDE COMPLEX Tachycardia on loop recorder.  . Cataract bilateral cataracts  . Diverticulosis   . Esophageal stricture   . GERD (gastroesophageal reflux disease)   . Hiatal hernia 11/05/2010  . HLD (hyperlipidemia)   . 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 Unable to induce during EP study.;  Due to CAD, flecainide discontinued -> intolerant of both mexiletine and amiodarone -> Dr. Curt Bears (EP-Cards) d/c'd mexilitine & statrted low dose Diltizem XT; Holter Monitor 12/2018 - HR 47-114, rate PACs/PVCs, no Afib/flutter or SVT, VT.   [name] Reflux gastritis   . STEMI involving left anterior descending coronary artery ([place]) 11/03/2004   [facility]: Occluded LAD treated with DES. Staged PCI to the RCA.     PAST SURGICAL HISTORY   Past Surgical History:  Procedure Laterality Date  . CARDIAC CATHETERIZATION  ~2011   [facility]: re-look Cath - patent stents  . CORONARY STENT INTERVENTION N/A 01/27/2017   Procedure: Coronary Stent Intervention;  Surgeon: [name], MD;  Location: [place] [facility]: mLAD (just after D1) overlapping prior DES -> SYNERGY DES 3X12 drug eluting stent  . ELECTROPHYSIOLOGIC STUDY  2014   Unable to stimulate VT seen on loop recorder  . HOLTER MONITOR  12/2018    (Off of mexiletine, only on diltiazem) heart rate ranged from 47 to 114 bpm.  Rare PACs and PVCs.  Sinus rhythm noted no A. fib or other arrhythmia.  [name] implanted loop heart monitor x2  ~2011; 2014   Per report- batter out of date; apparently captured a prolonged episode of wide complex tachycardia associated with an episode of weakness/near syncope  . INTRAVASCULAR PRESSURE WIRE/FFR STUDY N/A 01/27/2017   Procedure: Intravascular Pressure Wire/FFR Study;  Surgeon: [name], MD;  Location: [place] [facility];  Service: Cardiovascular;  Laterality: N/A;  . LEFT HEART CATH AND CORONARY ANGIOGRAPHY N/A 01/27/2017   Procedure: Left Heart Cath and Coronary Angiography;  Surgeon: [name], MD;  Location: [facility] INVASIVE CV LAB: mLAD 99% stenosis pre-Stent & ISR --> PCI. Ost DI ~50%. pRCA DES ~50-60% FFR 0.86.  [name] NM MYOVIEW LTD  07/2014   Unremarkable EKG. No evidence of ischemia or infarct. Mild anterior attenuation, cannot exclude prior infarct, but nonreversible. EF 66%.  [name] NM MYOVIEW LTD  12/22/2016   INTERMEDIATE RISK. EF 58%. Defect 1: Medium size severe defect in the mid anterior, apical anterior and apical wall consistent with prior anterior MI. Defect to: Large defect and moderate severity in the basal inferoseptal basal inferior and inferoseptal mid inferior wall consistent with ischemia.  [name] NM MYOVIEW LTD  04/05/2019   EF 55 to 65%.  Small size, mild severity fixed perfusion defect in the  mid-apical anterior septal wall.  Likely represents old anterior infarct.   No evidence of ischemia. LOW RISK.   [name] PERCUTANEOUS CORONARY STENT INTERVENTION (PCI-S)  January-February 2006   a. mLAD - Taxus DES 3.0 x 20; b. (11/06/04) staged PCI to RCA Taxus DES 3.5 x 16; c. 01/27/2017: PCI to mLAD prox to old  stent - Synergy DES 3.0 x 12  .  TRANSTHORACIC ECHOCARDIOGRAM  10/11/2020    [facility]):  Normal LV size & function. EF 55-60%. NO RWMA. Normal filling parameters.  Mild TR however estimated moderate pulmonary hypertension with RVSP 52 mmHg.  IVC is mildly dilated.  No vegetations noted.  No significant change from last study.  Domingo Dimes ECHOCARDIOGRAM  05/2020   [facility]: 05/21/2020: Normal LV size and function.  Normal valves.  No effusion.; 06/04/2020: Normal LV size and function EF 60 to 65%.  Trace MR and TR.  No pulmonary hypertension.  Stable  . UMBILICAL HERNIA REPAIR     Most Recent Cath -> (01/2017). @ [facility] (Dr. [name]) CATH-PCI [id number] DES 3.0 x 12 (for 99% lesion @ prox edge of old stent   Immunization History  Administered Date(s) Administered  . Influenza, High Dose Seasonal PF 08/03/2018  . Influenza-Unspecified 06/17/2012  . Pneumococcal Conjugate-13 03/20/2014  . Pneumococcal Polysaccharide-23 05/06/2011  . Td 12/23/2009  . Zoster 02/23/2013    MEDICATIONS/ALLERGIES   Current Meds  Medication Sig  . acetaminophen (TYLENOL) 500 MG tablet Take 1,000 mg by mouth 3 (three) times daily as needed for moderate pain or headache.  . Cholecalciferol (VITAMIN D3) 1000 units CAPS Take 1,000 Units by mouth at bedtime.  . clopidogrel (PLAVIX) 75 MG tablet TAKE 1 TABLET(75 MG) BY MOUTH DAILY  . diltiazem (CARDIZEM) 60 MG tablet Take 1 tablet (60 mg total) by mouth 4 (four) times daily.  [name] oxybutynin (DITROPAN) 5 MG tablet Take 5 mg by mouth daily.  . pantoprazole (PROTONIX) 40 MG tablet Take 40 mg by mouth daily.  . rosuvastatin (CRESTOR) 20 MG tablet  Take 20 mg by mouth daily.    Allergies  Allergen Reactions  . Silver Sulfadiazine Hives and Rash  . Latex Other (See Comments)    "takes the skin off"  . Adhesive [Tape] Rash  . Hydrocodone Rash    SOCIAL HISTORY/FAMILY HISTORY   Reviewed in Epic:  Pertinent findings:  Social History   Tobacco Use  . Smoking status: Never Smoker  . Smokeless tobacco: Never Used  Vaping Use  . Vaping Use: Never used  Substance Use Topics  . Alcohol use: No  . Drug use: No   Social History   Social History Narrative  . Not on file    OBJCTIVE -PE, EKG, labs   Wt Readings from Last 3 Encounters:  11/06/20 171 lb (77.6 kg)  08/02/20 185 lb (83.9 kg)  04/12/20 199 lb 9.6 oz (90.5 kg)    Physical Exam: BP (!) 110/56 (BP Location: Left Arm, Patient Position: Sitting, Cuff Size: Normal)   Pulse 90   Ht 5\' 11"  (1.803 m)   Wt 171 lb (77.6 kg)   BMI 23.85 kg/m  Physical Exam Vitals reviewed.  Constitutional:      General: He is not in acute distress.    Appearance: He is normal weight. He is ill-appearing (Mildly ill-appearing).     Comments: Notably more frail having lost significant weight-additional weight compared to previous visit with second hospitalization  HENT:     Head: Normocephalic and atraumatic.  Neck:     Vascular: No carotid bruit, hepatojugular reflux or JVD.  Cardiovascular:     Rate and Rhythm: Normal rate and regular rhythm.  No extrasystoles are present.    Chest Wall: PMI is not displaced.     Pulses: Normal pulses.     Heart sounds: Normal heart sounds. No murmur heard. No gallop.   Pulmonary:  Effort: Pulmonary effort is normal. No respiratory distress.     Breath sounds: Normal breath sounds.  Chest:     Chest wall: No tenderness.  Musculoskeletal:        General: No swelling. Normal range of motion.     Cervical back: Normal range of motion and neck supple.  Skin:    General: Skin is warm and dry.     Coloration: Skin is pale.   Neurological:     General: No focal deficit present.     Mental Status: He is alert and oriented to person, place, and time.     Motor: No weakness.     Gait: Gait normal.  Psychiatric:        Mood and Affect: Mood normal.        Behavior: Behavior normal.        Thought Content: Thought content normal.        Judgment: Judgment normal.      Adult ECG Report  Rate: 90;  Rhythm: normal sinus rhythm and 1  AVB.  PVCs, and MI, age-indeterminate.;   Narrative Interpretation: Stable  Recent Labs:  Reviewed  [facility] Lipid Profile Component 07/25/20 08/22/19 03/15/18 03/18/17  LDL Direct 73 74 69 64  Total Cholesterol 138 124 113 117  Triglycerides 194High 161 90 92  HDL Cholesterol 32Low 37 34Low 35   Related to Basic Metabolic Panel Component 0000000 10/15/20 10/14/20 10/13/20  Sodium 132Low 135 137 135  Potassium 3.7 3.2 3.5 2.9Low Panic   Chloride 99 95 101 101  CO2 29 31 29 29   BUN 18 18 18 16   Glucose 165High  116  134High  165High   Creatinine 1.08 1.23 1.17 1.19  Calcium 7.8Low 7.6 7.9Low 7.6Low  Anion Gap 4 9 7 5   Est. GFR 70  60  63  62    CBC Component 10/16/20 10/15/20 10/14/20 10/13/20  WBC 17.1High 17.8 20.1High 22.3High  RBC 3.08Low 3.22 3.20Low 3.23Low  Hemoglobin 9.1Low 9.7 9.7Low 9.7Low  Hematocrit 27.8Low 28.9 28.6Low 28.8Low  MCV 90.4 89.8 89.2 89.1  MCH 29.4 30.0 30.4 30.0  MCHC 32.6Low 33.5 34.1 33.7  RDW 14.2 14.2 14.1 13.9  Platelets 373 351 373 367     No results found for: CHOL, HDL, LDLCALC, LDLDIRECT, TRIG, CHOLHDL Lab Results  Component Value Date   CREATININE 1.06 01/27/2017   BUN 18 01/27/2017   NA 137 01/27/2017   K 4.0 01/27/2017   CL 106 01/27/2017   CO2 23 01/27/2017   CBC Latest Ref Rng & Units 01/21/2017 04/05/2012  WBC 3.8 - 10.8 K/uL 8.6 6.3  Hemoglobin 13.2 - 17.1 g/dL 13.1(L) 13.9  Hematocrit 38.5 - 50.0 % 38.7 41.4  Platelets 140 -  400 K/uL 205 182.0    Lab Results  Component Value Date   TSH 2.44 04/05/2012    ==================================================  COVID-19 Education: The signs and symptoms of COVID-19 were discussed with the patient and how to seek care for testing (follow up with PCP or arrange E-visit).   The importance of social distancing and COVID-19 vaccination was discussed today. The patient is practicing social distancing & Masking.   I spent a total of 40 minutes with the patient spent in direct patient consultation.  Additional time spent with chart review  / charting (studies, outside notes, etc): 35 min Total Time: 75 min   Current medicines are reviewed at length with the patient today.  (+/- concerns) he asked about restarting  diltiazem because of palpitations  This visit occurred during the SARS-CoV-2 public health emergency.  Safety protocols were in place, including screening questions prior to the visit, additional usage of staff PPE, and extensive cleaning of exam room while observing appropriate contact time as indicated for disinfecting solutions.  Notice: This dictation was prepared with Dragon dictation along with smaller phrase technology. Any transcriptional errors that result from this process are unintentional and may not be corrected upon review.  Patient Instructions / Medication Changes & Studies & Tests Ordered   Patient Instructions  Medication Instructions:      start taking Diltiazem 60 mg one tablet twice a day   - if your first dose makes you fill weak or woozy do not take the second dose for that day . -- take medication for one month if you have no problems restart taking diltiazem 120 mg  Tablet daily   *If you need a refill on your cardiac medications before your next appointment, please call your pharmacy*   Lab Work: notneeded   Testing/Procedures:  this is will be mailed to you  Your physician has recommended that you wear a  14 day Zio   holter monitor. Holter monitors are medical devices that record the heart's electrical activity. Doctors most often use these monitors to diagnose arrhythmias. Arrhythmias are problems with the speed or rhythm of the heartbeat. The monitor is a small, portable device. You can wear one while you do your normal daily activities. This is usually used to diagnose what is causing palpitations/syncope (passing out).    Follow-Up: At [facility], you and your health needs are our priority.  As part of our continuing mission to provide you with exceptional heart care, we have created designated Provider Care Teams.  These Care Teams include your primary Cardiologist (physician) and Advanced Practice Providers (APPs -  Physician Assistants and Nurse Practitioners) who all work together to provide you with the care you need, when you need it.  We recommend signing up for the patient portal called "MyChart".  Sign up information is provided on this After Visit Summary.  MyChart is used to connect with patients for Virtual Visits (Telemedicine).  Patients are able to view lab/test results, encounter notes, upcoming appointments, etc.  Non-urgent messages can be sent to your provider as well.   To learn more about what you can do with MyChart, go to [email or web address].    Your next appointment:   2 month(s)  The format for your next appointment:   In Person  Provider:   [name], MD   Other Instructions   ZIO XT- Long Term Monitor Instructions   Your physician has requested you wear your ZIO patch monitor__14_____days.  Instructions provided  Studies Ordered:   Orders Placed This Encounter  Procedures  . LONG TERM MONITOR (3-14 DAYS)  . EKG 12-Lead     [name], M.D., M.S. Interventional Cardiologist   Pager # [phone] Phone # [phone] [address] [place], [place] [address]   Thank you for choosing Heartcare at [facility]!!

## 2020-11-06 NOTE — Patient Instructions (Addendum)
Medication Instructions:      start taking Diltiazem 60 mg one tablet twice a day   - if your first dose makes you fill weak or woozy do not take the second dose for that day . -- take medication for one month if you have no problems restart taking diltiazem 120 mg  Tablet daily   *If you need a refill on your cardiac medications before your next appointment, please call your pharmacy*   Lab Work: notneeded   Testing/Procedures:  this is will be mailed to you  Your physician has recommended that you wear a  14 day Zio  holter monitor. Holter monitors are medical devices that record the heart's electrical activity. Doctors most often use these monitors to diagnose arrhythmias. Arrhythmias are problems with the speed or rhythm of the heartbeat. The monitor is a small, portable device. You can wear one while you do your normal daily activities. This is usually used to diagnose what is causing palpitations/syncope (passing out).    Follow-Up: At [facility], you and your health needs are our priority.  As part of our continuing mission to provide you with exceptional heart care, we have created designated Provider Care Teams.  These Care Teams include your primary Cardiologist (physician) and Advanced Practice Providers (APPs -  Physician Assistants and Nurse Practitioners) who all work together to provide you with the care you need, when you need it.  We recommend signing up for the patient portal called "MyChart".  Sign up information is provided on this After Visit Summary.  MyChart is used to connect with patients for Virtual Visits (Telemedicine).  Patients are able to view lab/test results, encounter notes, upcoming appointments, etc.  Non-urgent messages can be sent to your provider as well.   To learn more about what you can do with MyChart, go to [email or web address].    Your next appointment:   2 month(s)  The format for your next appointment:   In Person  Provider:   [name], MD   Other Instructions   ZIO XT- Long Term Monitor Instructions   Your physician has requested you wear your ZIO patch monitor__14_____days.   This is a single patch monitor.  [employer] supplies one patch monitor per enrollment.  Additional stickers are not available.   Please do not apply patch if you will be having a Nuclear Stress Test, Echocardiogram, Cardiac CT, MRI, or Chest Xray during the time frame you would be wearing the monitor. The patch cannot be worn during these tests.  You cannot remove and re-apply the ZIO XT patch monitor.   Your ZIO patch monitor will be sent USPS Priority mail from [facility] directly to your home address. The monitor may also be mailed to a PO BOX if home delivery is not available.   It may take 3-5 days to receive your monitor after you have been enrolled.   Once you have received you monitor, please review enclosed instructions.  Your monitor has already been registered assigning a specific monitor serial # to you.   Applying the monitor   Shave hair from upper left chest.   Hold abrader disc by orange tab.  Rub abrader in 40 strokes over left upper chest as indicated in your monitor instructions.   Clean area with 4 enclosed alcohol pads .  Use all pads to assure are is cleaned thoroughly.  Let dry.   Apply patch as indicated in monitor instructions.  Patch will be place under collarbone on  left side of chest with arrow pointing upward.   Rub patch adhesive wings for 2 minutes.Remove white label marked "1".  Remove white label marked "2".  Rub patch adhesive wings for 2 additional minutes.   While looking in a mirror, press and release button in center of patch.  A small green light will flash 3-4 times .  This will be your only indicator the monitor has been turned on.     Do not shower for the first 24 hours.  You may shower after the first 24 hours.   Press button if you feel a symptom. You will hear a small click.   Record Date, Time and Symptom in the Patient Log Book.   When you are ready to remove patch, follow instructions on last 2 pages of Patient Log Book.  Stick patch monitor onto last page of Patient Log Book.   Place Patient Log Book in [place] box.  Use locking tab on box and tape box closed securely.  The [employer] has [employer] on it.  Please place in mailbox as soon as possible.  Your physician should have your test results approximately 7 days after the monitor has been mailed back to [facility]   Call [place] at [phone] if you have questions regarding your ZIO XT patch monitor.  Call them immediately if you see an orange light blinking on your monitor.   If your monitor falls off in less than 4 days contact our Monitor department at [phone].  If your monitor becomes loose or falls off after 4 days call [employer] at [phone] for suggestions on securing your monitor.

## 2020-11-09 ENCOUNTER — Encounter: Payer: Self-pay | Admitting: Cardiology

## 2020-11-09 NOTE — Assessment & Plan Note (Signed)
Now on lifelong Plavix.  No aspirin.  Okay to hold Plavix 5 days preop for surgeries or procedures.[name]

## 2020-11-09 NOTE — Assessment & Plan Note (Signed)
Thankfully, he has been through 2 major pulmonary infections, and has not had any untoward effect on the heart.  No cardiomyopathy associated Covid.  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  No anginal symptoms.  He clearly is short of breath with activity, but not unexpectedly.  He is currently on rosuvastatin and more appropriately, I agree that we may want to consider restarting diltiazem-however prefer to avoid restarting a long-acting dose in case he does have some symptoms of orthostatic hypotension.  Plan: Diltiazem 60 mg twice daily--if tolerated after the first month, he can restart his 120 mg XT dose.

## 2020-11-09 NOTE — Assessment & Plan Note (Signed)
No longer on Florinef or midodrine, but still having hypotension.  Blood pressures have been drifting up.  He has lost a significant weight.  B.  Make sure that he is actually eating and hydrating properly.  With him having palpitations, diltiazem, he did closely.  We will start with low-dose short acting diltiazem to avoid a 24-hour pill.

## 2020-11-09 NOTE — Assessment & Plan Note (Signed)
Most recent labs showed his cholesterol fairly above goal with an LDL 73.  He has been hovering around 70 with his current dose.  I will give him some time to recover from his recent hospitalizations, we can review assess whether or not we increase his Crestor dose in the future.  For now continue current dose of Crestor.

## 2020-11-09 NOTE — Assessment & Plan Note (Signed)
His palpitations been well controlled on diltiazem in the past.  Now they have come back.  We will need to restart diltiazem, but will start with short acting 60 mg twice daily and titrate up to 100 mg next he is tolerating it well.

## 2020-11-09 NOTE — Assessment & Plan Note (Signed)
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  There was concern for wide-complex tachycardia in the past unable to tolerate anything other medications other than diltiazem.  Had been on flecainide, but not indicated with anterior MI.[name]  Now with palpitations again, we will restart low-dose diltiazem.[name]

## 2020-11-09 NOTE — Assessment & Plan Note (Signed)
With his tendency to have wide-complex tachycardia now having palpitations, need to exclude any serious arrhythmias.  Plan: 2-week Zio patch monitor. We will start low-dose diltiazem 60 mg twice daily short acting dose.  If tolerated after 1 month we can increase to full 120 mg XT dose.

## 2020-11-15 ENCOUNTER — Other Ambulatory Visit: Payer: Self-pay | Admitting: Cardiology

## 2021-01-08 ENCOUNTER — Ambulatory Visit (INDEPENDENT_AMBULATORY_CARE_PROVIDER_SITE_OTHER): Payer: Medicare Other | Admitting: Cardiology

## 2021-01-08 ENCOUNTER — Encounter: Payer: Self-pay | Admitting: Cardiology

## 2021-01-08 ENCOUNTER — Other Ambulatory Visit: Payer: Self-pay

## 2021-01-08 VITALS — BP 122/62 | HR 64 | Ht 71.0 in | Wt 176.0 lb

## 2021-01-08 DIAGNOSIS — I9589 Other hypotension: Secondary | ICD-10-CM

## 2021-01-08 DIAGNOSIS — Z955 Presence of coronary angioplasty implant and graft: Secondary | ICD-10-CM | POA: Diagnosis not present

## 2021-01-08 DIAGNOSIS — I2510 Atherosclerotic heart disease of native coronary artery without angina pectoris: Secondary | ICD-10-CM | POA: Diagnosis not present

## 2021-01-08 DIAGNOSIS — E785 Hyperlipidemia, unspecified: Secondary | ICD-10-CM | POA: Diagnosis not present

## 2021-01-08 DIAGNOSIS — I472 Ventricular tachycardia: Secondary | ICD-10-CM | POA: Diagnosis not present

## 2021-01-08 DIAGNOSIS — I4729 Other ventricular tachycardia: Secondary | ICD-10-CM

## 2021-01-08 DIAGNOSIS — R002 Palpitations: Secondary | ICD-10-CM

## 2021-01-08 DIAGNOSIS — R000 Tachycardia, unspecified: Secondary | ICD-10-CM

## 2021-01-08 DIAGNOSIS — R6889 Other general symptoms and signs: Secondary | ICD-10-CM

## 2021-01-08 MED ORDER — DILTIAZEM HCL ER 120 MG PO CP24: 120.0000 mg | ORAL_CAPSULE | Freq: Every day | ORAL | 3 refills | Status: DC

## 2021-01-08 MED ORDER — DILTIAZEM HCL 60 MG PO TABS: 60.0000 mg | ORAL_TABLET | Freq: Four times a day (QID) | ORAL | 1 refills | Status: DC

## 2021-01-08 NOTE — Progress Notes (Signed)
Primary Care Provider: [name], MD Cardiologist: [name], MD Electrophysiologist: Will [name], MD  Clinic Note: Chief Complaint  Patient presents with  . Follow-up    Monitor results  . Coronary Artery Disease    No angina  . Palpitations    Monitor results   ===================================  ASSESSMENT/PLAN   Problem List Items Addressed This Visit    Coronary artery disease involving native coronary artery of native heart without angina pectoris - Primary (Chronic)    Negative Myoview in July 2020.  Showing improved ischemia in 2018.  He tolerated significant stressors with COVID-pneumonia and long-term hospitalization followed by bilateral MSSA pneumonia this past December after January.  Despite all of these significant episodes, he has not had any change in his EF from his long-term allergies.  No anginal symptoms.  Plan:   To maintenance dose Plavix without aspirin.  He had significant fatigue with beta-blockers, restart diltiazem XT 120 mg (continue with PRN short acting for palpitations),  Continue statin.      Relevant Medications   diltiazem (DILACOR XR) 120 MG 24 hr capsule   diltiazem (CARDIZEM) 60 MG tablet   Wide-complex tachycardia - WCT (HCC) (Chronic)    No evidence of wide-complex tachycardia noted on monitor.  Palpitations seem to improved on low-dose diltiazem.  Will convert to long-acting.   PVCs noted, but not significant.- 1.6%.      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    Plan:  lifelong Plavix,  okay to stop 5 days preop for surgery procedures.  (7 days for spinal or neural procedures.)      Exercise intolerance (Chronic)    Gradually improving.  No active angina symptoms.  Now that his energy is improving,  less notable exercise intolerance.      Hyperlipidemia LDL goal <70 (Chronic)    Labs checked last October showed LDL 73.  Pretty much at goal.  Would like for him to stabilize out from his illnesses before recheck.  Should be due for recheck soon by PCP.  Continue current dose of statin.      Relevant Medications   diltiazem (DILACOR XR) 120 MG 24 hr capsule   diltiazem (CARDIZEM) 60 MG tablet   Hypotension (Chronic)    No longer on Florinef or midodrine.  Blood pressure seem to be more stable.  Should be able to tolerate long-acting diltiazem.  We will restart at 120 mg diltiazem XT.  Continue to use diltiazem short short acting 60 mg as needed.      Relevant Medications   diltiazem (DILACOR XR) 120 MG 24 hr capsule   diltiazem (CARDIZEM) 60 MG tablet   Palpitations (Chronic)    Reassuring monitor having started back on his diltiazem short acting.  No overly concerning findings other than Mild ectopic atrial rhythms.  No A. fib, no VT.  No SVT.  Plan: We will convert to long-acting diltiazem XT 120 mg daily, continue to use short acting diltiazem 60 mg for breakthrough spells.      Paroxysmal VT (HCC) (Chronic)    No VT noted on monitor.      Relevant Medications   diltiazem (DILACOR XR) 120 MG 24 hr capsule   diltiazem (CARDIZEM) 60 MG tablet     ===================================  HPI:    Shaarav Ripple is a 80 y.o. male with a PMH below who presents today for 22-month follow-up to review the results of his event monitor.   Anterior STEMI January 2006(per pt - never truly had CP - mostly dyspnea):  ? LAD PCI, staged PCI RCA ? DocumentedWIDE-COMPLEX TACHYCARDIAon loop recorder for evaluation of syncope (end of life of second recorder) -> initially maintained on flecainided/c 2/2 H/OCAD   02/2019: intolerance of both mexiletine and amiodarone--> noted improvement of "strange palpitation / dizziness Sx previously noted & felt to to be arrhythmia mediated --> Noted  easier fatigue (tiring) with less activity.  Now on low-dose diltiazem only.->Notably improved symptoms with no recurrent arrhythmia.  His  Transfered Care to [facility] in early 2018--Baseline Myoview ordered ? 12/2016,Myoview->SIGNIFICANT ANTERIOR DEFECT WITH REVERSIBILITY->  CATHfound severe ISR of LAD stent and patent RCA stent -->April 2018 DES PCI LAD  ? 04/2019, Myoview:Normal EF. Very small size, mild intensity fixed anteroapical/septal defect-likely representing old anterior infarct. No ischemia. LOW RISK   8/12-9/07/2020 Admitted to [facility] with COVID-19 PNA-Acute Hypoxic Respiratory Failure & SEVERE ORTHOSTATIC HYPOTENSION (discharged on Florinef after failing midodrine), he was treated with remdesivir, prednisone/dexamethasone.  He was also notably pancytopenic and hyponatremic with SIADH.--Diltiazem was held. => Rehab x 5 weeks.  (notable weight loss)  @ f/u appointment 08/01/2020 - BP better, starting to return to work.  Still very weak and malnourished.  However his appetite was getting better.NO CHF or ANGINA. --> planned to reassess in 3 months to determine timing of restarting Diltiazem.   BDZ'32-DJM '22:  12/6/: Seen by PA for F/Cough & ST with wheezing & body aches. = Temp 103. CXR "not revealing" - considered to be viral -=> seen in close f/u, no improvement -> Rx with Doxycycline & prednisone.   12/30: Urgent Care - c/w chest discomfort & persistent cough + F 102. -> OTC Rx. =>  Admitted Jan 3-13 (WF @ [facility]): Temp 104, cough, myalgias & near syncope => Multifocal PNA / Sepsis (MSSA Bacteremia) = Echo Negative => Zosyn & Vanc -> converted to Ceftin. PICC line placed. IV Abx through Feb 4, 22. D/c on Home O2.   Vikram Tillett was last seen on on 11/06/2020-indicated he was finally starting to feel little better.  Just very exhausted and tired.  Had lost quite a bit of weight.  Was still on home oxygen.  Denies any chest pain or pressure.  No PND,  orthopnea or edema.  Blood pressures resting more stable.  He was started no more frequent palpitations, however.  Had a couple prolonged spells that were somewhat worrisome to him.  A little lightheaded but no syncope or near syncope.  Event Monitor ordered  Started back on diltiazem short acting 60 mg 4 times daily.  Recent Hospitalizations:   None  Reviewed  CV studies:    The following studies were reviewed today: (if available, images/films reviewed: From Epic Chart or Care Everywhere)  Event Monitor February 2022: Overall relatively benign.   Predominant underlying rhythm was Sinus Rhythm with 1 AVB: Heart rate ranged from 57 -132 bpm, average heart rate 90 bpm.  3 episodes of SVT (not verified by patient triggered): Fastest lasting 16 beats-rate average 187 bpm (fastest 200 bpm). This was also the longest.  Also noted was Ectopic Atrial Rhythm along with Wenkebach Block (Mobitz type I); it appears that patient symptoms correlated with ECTOPIC ATRIAL RHYTHM  Rare isolated PACs with couplets and triplets even more  rare.  Occasional (1.6%) PVCs with rare couplets and triplets. Minimal bigeminy/trigeminy noted.  Minimum heart rate 33 bpm (very short interval, at 6:50 AM))-> noted during ectopic atrial rhythm/Wenckebach block  Only 3 triggered events noted. -> 1 associated with sinus rhythm with PVCs, 1with ectopic atrial rhythm, and the final 1 with sinus rhythm.  No prolonged arrhythmias other than ectopic atrial rhythm: No sustained SVT, VT, A. fib or atrial flutter.  No significant/prolonged bradycardia or pauses.   Interval History:   Quint Chestnut presents here today for follow-up indicated that he actually is feeling a lot better.  Blood pressures been more stable.  Palpitations are also little better but still there-no longer prolonged..  No true syncope or near syncope symptoms.  He was happy to see the results of his monitor.  No active anginal symptoms.  Still a  bit short of breath with exertion, but notably improved.  No longer on home oxygen, and happy to have PICC line out with antibiotics over.  No further fevers or chills. 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  Energy level improving.  CV Review of Symptoms (Summary): positive for - dyspnea on exertion, palpitations, rapid heart rate, shortness of breath and Generalized weakness and fatigue. negative for - chest pain, edema, orthopnea, paroxysmal nocturnal dyspnea or Although has had lightheadedness or dizziness, no syncope or near syncope.  No TIA or amaurosis fugax.  The patient does not have symptoms concerning for COVID-19 infection (fever, chills, cough, or new shortness of breath).   REVIEWED OF SYSTEMS   Review of Systems  Constitutional: Positive for malaise/fatigue (Energy level improving, but still gets worn out quickly.). Negative for weight loss (Finally started back on weight.  Still 10 pounds down from his baseline..  However, he is happy with this weight and is not hoping to gain back).  HENT: Negative for congestion and nosebleeds.   Respiratory: Positive for shortness of breath (No longer on home O2.  Breathing is notably improved, still not quite to baseline.). Negative for cough (Mild residual cough).   Cardiovascular: Negative for leg swelling.  Gastrointestinal: Negative for blood in stool and melena.  Genitourinary: Negative for hematuria.  Musculoskeletal: Positive for joint pain (Noting more aches and pains of late, but nothing remarkable.). Negative for falls.       Mild diffuse aches and pains  Neurological: Positive for dizziness (Notably improved., but overall more stable.), weakness (Global-improving daily-) and headaches.  Psychiatric/Behavioral: Negative for depression and memory loss. The patient is not nervous/anxious and does not have insomnia.    I have reviewed and (if needed) personally  updated the patient's problem list, medications, allergies, past medical and surgical history, social and family history.   PAST MEDICAL HISTORY   Past Medical History:  Diagnosis Date  . Allergy   . Arthritis   . 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 basal cell skin cancer   . CAD S/P percutaneous coronary angioplasty 11/03/2004   a. Ant Stemi (11/03/04) mLAD - PCI: Taxus DES 3.0 x 20; b. (11/06/04) staged RCA DES PCI: Taxus DES 3.5 x 16; c. (01/2017) High Risk Myoview (~fixed anterior defect with normal WM) @ [facility] (Dr. [name]) Synergy DES 3.0 x 12 (for 99% lesion @ prox edge of old stent  . Cardiac syncope    No episode since starting flecainide. Had documented WIDE COMPLEX Tachycardia on loop recorder.  . Cataract bilateral cataracts  .  Diverticulosis   . Esophageal stricture   . GERD (gastroesophageal reflux disease)   . Hiatal hernia 11/05/2010  . HLD (hyperlipidemia)   . Nonsustained paroxysmal ventricular tachycardia ([place]) 2014   Initially controlled with flecainide. Unable to induce during EP study.;  Due to CAD, flecainide discontinued -> intolerant of both mexiletine and amiodarone -> Dr. Curt Bears (EP-Cards) d/c'd mexilitine & statrted low dose Diltizem XT; Holter Monitor 12/2018 - HR 47-114, rate PACs/PVCs, no Afib/flutter or SVT, VT.   [name] Reflux gastritis   . STEMI involving left anterior descending coronary artery ([place]) 11/03/2004   [facility]: Occluded LAD treated with DES. Staged PCI to the RCA.    PAST SURGICAL HISTORY   Past Surgical History:  Procedure Laterality Date  . CARDIAC CATHETERIZATION  ~2011   [facility]: re-look Cath - patent stents  . CORONARY STENT INTERVENTION N/A 01/27/2017   Procedure: Coronary Stent Intervention;  Surgeon: [name], MD;  Location: [place] [facility] LAB: mLAD (just after D1) overlapping prior DES -> SYNERGY DES 3X12 drug eluting stent  . ELECTROPHYSIOLOGIC STUDY  2014   Unable to stimulate VT seen on loop recorder  .  HOLTER MONITOR  12/2018    (Off of mexiletine, only on diltiazem) heart rate ranged from 47 to 114 bpm.  Rare PACs and PVCs.  Sinus rhythm noted no A. fib or other arrhythmia.  [name] implanted loop heart monitor x2  ~2011; 2014   Per report- batter out of date; apparently captured a prolonged episode of wide complex tachycardia associated with an episode of weakness/near syncope  . INTRAVASCULAR PRESSURE WIRE/FFR STUDY N/A 01/27/2017   Procedure: Intravascular Pressure Wire/FFR Study;  Surgeon: [name], MD;  Location: [place] [facility];  Service: Cardiovascular;  Laterality: N/A;  . LEFT HEART CATH AND CORONARY ANGIOGRAPHY N/A 01/27/2017   Procedure: Left Heart Cath and Coronary Angiography;  Surgeon: [name], MD;  Location: [facility] INVASIVE CV LAB: mLAD 99% stenosis pre-Stent & ISR --> PCI. Ost DI ~50%. pRCA DES ~50-60% FFR 0.86.  [name] NM MYOVIEW LTD  07/2014   Unremarkable EKG. No evidence of ischemia or infarct. Mild anterior attenuation, cannot exclude prior infarct, but nonreversible. EF 66%.  [name] NM MYOVIEW LTD  12/22/2016   INTERMEDIATE RISK. EF 58%. Defect 1: Medium size severe defect in the mid anterior, apical anterior and apical wall consistent with prior anterior MI. Defect to: Large defect and moderate severity in the basal inferoseptal basal inferior and inferoseptal mid inferior wall consistent with ischemia.  [name] NM MYOVIEW LTD  04/05/2019   EF 55 to 65%.  Small size, mild severity fixed perfusion defect in the mid-apical anterior septal wall.  Likely represents old anterior infarct.   No evidence of ischemia. LOW RISK.   [name] PERCUTANEOUS CORONARY STENT INTERVENTION (PCI-S)  January-February 2006   a. mLAD - Taxus DES 3.0 x 20; b. (11/06/04) staged PCI to RCA Taxus DES 3.5 x 16; c. 01/27/2017: PCI to mLAD prox to old  stent - Synergy DES 3.0 x 12  . TRANSTHORACIC ECHOCARDIOGRAM  10/11/2020    [facility]):  Normal LV size & function. EF 55-60%. NO RWMA. Normal filling parameters.  Mild TR  however estimated moderate pulmonary hypertension with RVSP 52 mmHg.  IVC is mildly dilated.  No vegetations noted.  No significant change from last study.  Domingo Dimes ECHOCARDIOGRAM  05/2020   [facility]: 05/21/2020: Normal LV size and function.  Normal valves.  No effusion.;  06/04/2020: Normal LV size and function EF 60 to 65%.  Trace MR and TR.  No pulmonary hypertension.  Stable  . UMBILICAL HERNIA REPAIR     Most Recent Cath -> (01/2017). @ [facility] (Dr. [name]) CATH-PCI LEX:NTZGYFV DES 3.0 x 12 (for 99% lesion @ prox edge of old stent   Immunization History  Administered Date(s) Administered  . Influenza, High Dose Seasonal PF 08/03/2018  . Influenza-Unspecified 06/17/2012  . Pneumococcal Conjugate-13 03/20/2014  . Pneumococcal Polysaccharide-23 05/06/2011  . Td 12/23/2009  . Zoster 02/23/2013    MEDICATIONS/ALLERGIES   Current Meds  Medication Sig  . acetaminophen (TYLENOL) 500 MG tablet Take 1,000 mg by mouth 3 (three) times daily as needed for moderate pain or headache.  . Cholecalciferol (VITAMIN D3) 1000 units CAPS Take 1,000 Units by mouth at bedtime.  . clopidogrel (PLAVIX) 75 MG tablet TAKE 1 TABLET(75 MG) BY MOUTH DAILY  . diltiazem (DILACOR XR) 120 MG 24 hr capsule Take 1 capsule (120 mg total) by mouth daily.  [name] oxybutynin (DITROPAN) 5 MG tablet Take 5 mg by mouth daily.  . pantoprazole (PROTONIX) 40 MG tablet Take 40 mg by mouth daily.  . rosuvastatin (CRESTOR) 20 MG tablet Take 20 mg by mouth daily.  . [DISCONTINUED] diltiazem (CARDIZEM) 60 MG tablet Take 1 tablet (60 mg total) by mouth 4 (four) times daily.    Allergies  Allergen Reactions  . Silver Sulfadiazine Hives and Rash  . Latex Other (See Comments)    "takes the skin off"  . Adhesive [Tape] Rash  . Hydrocodone Rash    SOCIAL HISTORY/FAMILY HISTORY   Reviewed in Epic:  Pertinent findings:  Social History   Tobacco Use  . Smoking status: Never Smoker  . Smokeless  tobacco: Never Used  Vaping Use  . Vaping Use: Never used  Substance Use Topics  . Alcohol use: No  . Drug use: No   Social History   Social History Narrative  . Not on file    OBJCTIVE -PE, EKG, labs   Wt Readings from Last 3 Encounters:  01/08/21 176 lb (79.8 kg)  11/06/20 171 lb (77.6 kg)  08/02/20 185 lb (83.9 kg)    Physical Exam: BP 122/62   Pulse 64   Ht 5\' 11"  (1.803 m)   Wt 176 lb (79.8 kg)   SpO2 98%   BMI 24.55 kg/m  Physical Exam Vitals reviewed.  Constitutional:      General: He is not in acute distress.    Appearance: He is normal weight. He is not ill-appearing (Less sickly).     Comments: Seems stronger.  More robust.  Has put back on some weight.  More healthy-appearing.  HENT:     Head: Normocephalic and atraumatic.  Neck:     Vascular: No carotid bruit, hepatojugular reflux or JVD.  Cardiovascular:     Rate and Rhythm: Normal rate and regular rhythm.  No extrasystoles are present.    Chest Wall: PMI is not displaced.     Pulses: Normal pulses.     Heart sounds: Normal heart sounds. No murmur heard. No gallop.   Pulmonary:     Effort: Pulmonary effort is normal. No respiratory distress.     Breath sounds: Normal breath sounds.  Chest:     Chest wall: No tenderness.  Musculoskeletal:        General: No swelling. Normal range of motion.     Cervical back: Normal range of motion and neck supple.  Skin:  General: Skin is warm and dry.     Coloration: Skin is not pale.  Neurological:     General: No focal deficit present.     Mental Status: He is alert and oriented to person, place, and time.     Motor: No weakness.     Gait: Gait normal.  Psychiatric:        Mood and Affect: Mood normal.        Behavior: Behavior normal.        Thought Content: Thought content normal.        Judgment: Judgment normal.      Adult ECG Report Not checked  Recent Labs:  Reviewed  [facility] Lipid Profile Component 07/25/20  08/22/19 03/15/18 03/18/17  LDL Direct 73 74 69 64  Total Cholesterol 138 124 113 117  Triglycerides 194High 161 90 92  HDL Cholesterol 32Low 37 34Low 35   Related to Basic Metabolic Panel   Component 11/11/20 10/16/20 10/15/20 10/14/20 10/13/20 10/12/20  Sodium 133 132Low 135 137 135 134Low  Potassium 3.8 3.7 3.2Low 3.5 2.9Low Panic  3.1Low  Chloride 99 99 95Low 101 101 103  CO2 27 29 31High 29 29 24   BUN 14 18 18 18 16 16   Glucose 120  165High  116High  134High  165High  129High   Creatinine 1.28 1.08 1.23 1.17 1.19 0.95  Calcium 9.0 7.8Low 7.6Low 7.9Low 7.6Low 7.8Low  Anion Gap 7 4 9 7 5 7   Est. GFR 57  70  60  63      CBC and Differential Component 11/11/20 10/16/20 10/15/20 10/14/20  WBC 12.7 17.1High 17.8High 20.1High  RBC 3.27 3.08Low 3.22Low 3.20Low  Hemoglobin 9.6 9.1Low 9.7Low 9.7Low  Hematocrit 28.8 27.8Low 28.9Low 28.6Low  MCV 88.1 90.4 89.8 89.2  MCH 29.3 29.4 30.0 30.4  MCHC 33.3 32.6Low 33.5 34.1  RDW 14.5 14.2 14.2 14.1  Platelets 314 373 351 373   Hemoglobin A1C Component 11/11/20 07/25/20 08/03/18  HEMOGLOBIN A1C 6.8  6.3High  6.8High      No results found for: CHOL, HDL, LDLCALC, LDLDIRECT, TRIG, CHOLHDL  =================================================  COVID-19 Education: The signs and symptoms of COVID-19 were discussed with the patient and how to seek care for testing (follow up with PCP or arrange E-visit).   The importance of social distancing and COVID-19 vaccination was discussed today. The patient is practicing social distancing & Masking.   I spent a total of 27 minutes with the patient spent in direct patient consultation.  Additional time spent with chart review  / charting (studies, outside notes, etc): 38min Total Time: 37 min  Current medicines are reviewed at length with the patient today.  (+/- concerns) he asked about restarting diltiazem  because of palpitations  This visit occurred during the SARS-CoV-2 public health emergency.  Safety protocols were in place, including screening questions prior to the visit, additional usage of staff PPE, and extensive cleaning of exam room while observing appropriate contact time as indicated for disinfecting solutions.  Notice: This dictation was prepared with Dragon dictation along with smaller phrase technology. Any transcriptional errors that result from this process are unintentional and may not be corrected upon review.  Patient Instructions / Medication Changes & Studies & Tests Ordered   Patient Instructions  Medication Instructions:      start taking Diltiazem 60 mg one tablet twice a day   - if your first dose makes you fill weak or woozy do not take the second dose  for that day . -- take medication for one month if you have no problems restart taking diltiazem 120 mg  Tablet daily   *If you need a refill on your cardiac medications before your next appointment, please call your pharmacy*   Lab Work: notneeded   Testing/Procedures:  this is will be mailed to you  Your physician has recommended that you wear a  14 day Zio  holter monitor. Holter monitors are medical devices that record the heart's electrical activity. Doctors most often use these monitors to diagnose arrhythmias. Arrhythmias are problems with the speed or rhythm of the heartbeat. The monitor is a small, portable device. You can wear one while you do your normal daily activities. This is usually used to diagnose what is causing palpitations/syncope (passing out).    Follow-Up: At [facility], you and your health needs are our priority.  As part of our continuing mission to provide you with exceptional heart care, we have created designated Provider Care Teams.  These Care Teams include your primary Cardiologist (physician) and Advanced Practice Providers (APPs -  Physician Assistants and Nurse Practitioners)  who all work together to provide you with the care you need, when you need it.  We recommend signing up for the patient portal called "MyChart".  Sign up information is provided on this After Visit Summary.  MyChart is used to connect with patients for Virtual Visits (Telemedicine).  Patients are able to view lab/test results, encounter notes, upcoming appointments, etc.  Non-urgent messages can be sent to your provider as well.   To learn more about what you can do with MyChart, go to [email or web address].    Your next appointment:   2 month(s)  The format for your next appointment:   In Person  Provider:   [name], MD   Other Instructions   ZIO XT- Long Term Monitor Instructions   Your physician has requested you wear your ZIO patch monitor__14_____days.  Instructions provided  Studies Ordered:   No orders of the defined types were placed in this encounter.    [name], M.D., M.S. Interventional Cardiologist   Pager # [phone] Phone # [phone] [address]. [place], [place] [address]   Thank you for choosing Heartcare at [facility]!!

## 2021-01-08 NOTE — Patient Instructions (Signed)
Medication Instructions:   Diltiazem 60 mg  As needed for ( fast heart rate (tachycardia)   restart  Diltiazem Xl ( XT) 120 mg daily  *If you need a refill on your cardiac medications before your next appointment, please call your pharmacy*   Lab Work: Not needed   Testing/Procedures:  Not needed  Follow-Up: At [facility], you and your health needs are our priority.  As part of our continuing mission to provide you with exceptional heart care, we have created designated Provider Care Teams.  These Care Teams include your primary Cardiologist (physician) and Advanced Practice Providers (APPs -  Physician Assistants and Nurse Practitioners) who all work together to provide you with the care you need, when you need it.     Your next appointment:    6 to 8 month(s)  The format for your next appointment:   In Person  Provider:   [name], MD   Other Instructions

## 2021-01-23 ENCOUNTER — Encounter: Payer: Self-pay | Admitting: Cardiology

## 2021-01-23 NOTE — Assessment & Plan Note (Signed)
No VT noted on monitor.

## 2021-01-23 NOTE — Assessment & Plan Note (Signed)
Negative Myoview in July 2020.  Showing improved ischemia in 2018.  He tolerated significant stressors with COVID-pneumonia and long-term hospitalization followed by bilateral MSSA pneumonia this past December after January.  Despite all of these significant episodes, he has not had any change in his EF from his long-term allergies.  No anginal symptoms.  Plan:   To maintenance dose Plavix without aspirin.  He had significant fatigue with beta-blockers, restart diltiazem XT 120 mg (continue with PRN short acting for palpitations),  Continue statin.

## 2021-01-23 NOTE — Assessment & Plan Note (Signed)
Labs checked last October showed LDL 73.  Pretty much at goal.  Would like for him to stabilize out from his illnesses before recheck.  Should be due for recheck soon by PCP.  Continue current dose of statin.

## 2021-01-23 NOTE — Assessment & Plan Note (Signed)
Gradually improving.  No active angina symptoms.  Now that his energy is improving, less notable exercise intolerance.

## 2021-01-23 NOTE — Assessment & Plan Note (Signed)
No longer on Florinef or midodrine.  Blood pressure seem to be more stable.  Should be able to tolerate long-acting diltiazem.  We will restart at 120 mg diltiazem XT.  Continue to use diltiazem short short acting 60 mg as needed.

## 2021-01-23 NOTE — Assessment & Plan Note (Signed)
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    Plan:  lifelong Plavix,  okay to stop 5 days preop for surgery procedures.  (7 days for spinal or neural procedures.)

## 2021-01-23 NOTE — Assessment & Plan Note (Signed)
No evidence of wide-complex tachycardia noted on monitor.  Palpitations seem to improved on low-dose diltiazem.  Will convert to long-acting.   PVCs noted, but not significant.- 1.6%.

## 2021-01-23 NOTE — Assessment & Plan Note (Signed)
Reassuring monitor having started back on his diltiazem short acting.  No overly concerning findings other than Mild ectopic atrial rhythms.  No A. fib, no VT.  No SVT.  Plan: We will convert to long-acting diltiazem XT 120 mg daily, continue to use short acting diltiazem 60 mg for breakthrough spells.

## 2021-06-02 ENCOUNTER — Telehealth: Payer: Self-pay | Admitting: Cardiology

## 2021-06-02 NOTE — Telephone Encounter (Signed)
Not sure what to make of Sx - sounds like it could have been a TIA, but pretty short-lived.  Agree with ER if Sx recur or worse.   -If not - better to see PCP for evaluation.  [name], MD

## 2021-06-02 NOTE — Telephone Encounter (Signed)
Spoke with pt and on Saturday had a episode per pt was half asleep and wife had startled pt and pt noted stabbing pain to chest that lasted about 60 seconds and then tingling sensation to both arms , weakness this lasted for 20 -25 min  and then just did not feel well the rest of the night Per pt this pain is different than when had heart attack Pt wanted an appt nothing available with Dr [name] till Nov Informed pt to go to ED if has worsening symptoms Will forward to Dr [name] for review and recommendations ./cy

## 2021-06-02 NOTE — Telephone Encounter (Signed)
Pt c/o of Chest Pain: 1. Are you having CP right now? No this happen Saturday and his arm tingling and legs where weak 2. Are you experiencing any other symptoms (ex. SOB, nausea, vomiting, sweating)? No  3. How long have you been experiencing CP? One time 4. Is your CP continuous or coming and going? CP was one time with the sharpe pain, but the tingling lasted for a while. 5. Have you taken Nitroglycerin? Np

## 2021-06-03 ENCOUNTER — Telehealth: Payer: Self-pay | Admitting: Cardiology

## 2021-06-03 NOTE — Telephone Encounter (Signed)
PT AWARE OF RECOMMENDATIONS AND AGREES WITH PLAN .Alexander Armstrong

## 2021-06-03 NOTE — Telephone Encounter (Signed)
Patient's wife call to say that she wants to speak with Ivin Booty in regards to the episode the patient had on Saturday. She said she feels like he had an mini heart attach. Please advise

## 2021-06-03 NOTE — Telephone Encounter (Signed)
spoke to wife. She states she and the patient  receive message from Dr [name]  06/02/21 about patient. Wife  would  prefer patient see Dr [name] .  Patient did not got to ER or Primary  for follow up. Wife aware the first available is 05/19/21. Appointment given. Wife aware if patient has  similar symptoms to go to ER for evaluation

## 2021-06-19 ENCOUNTER — Other Ambulatory Visit: Payer: Self-pay

## 2021-06-19 ENCOUNTER — Encounter: Payer: Self-pay | Admitting: Cardiology

## 2021-06-19 ENCOUNTER — Ambulatory Visit (INDEPENDENT_AMBULATORY_CARE_PROVIDER_SITE_OTHER): Payer: Medicare Other | Admitting: Cardiology

## 2021-06-19 VITALS — BP 110/60 | HR 65 | Ht 71.0 in | Wt 194.4 lb

## 2021-06-19 DIAGNOSIS — Z955 Presence of coronary angioplasty implant and graft: Secondary | ICD-10-CM | POA: Diagnosis not present

## 2021-06-19 DIAGNOSIS — R6889 Other general symptoms and signs: Secondary | ICD-10-CM

## 2021-06-19 DIAGNOSIS — I472 Ventricular tachycardia: Secondary | ICD-10-CM | POA: Diagnosis not present

## 2021-06-19 DIAGNOSIS — G903 Multi-system degeneration of the autonomic nervous system: Secondary | ICD-10-CM

## 2021-06-19 DIAGNOSIS — I2510 Atherosclerotic heart disease of native coronary artery without angina pectoris: Secondary | ICD-10-CM | POA: Diagnosis not present

## 2021-06-19 DIAGNOSIS — E785 Hyperlipidemia, unspecified: Secondary | ICD-10-CM

## 2021-06-19 DIAGNOSIS — R000 Tachycardia, unspecified: Secondary | ICD-10-CM

## 2021-06-19 NOTE — Progress Notes (Signed)
Primary Care Provider: [name], MD Cardiologist: [name], MD Electrophysiologist: Will [name], MD  Clinic Note: Chief Complaint  Patient presents with   Follow-up    2 months.  Overall feeling better, stronger and healthier.  Weight is up.   Coronary Artery Disease    No angina     ===================================  ASSESSMENT/PLAN   Problem List Items Addressed This Visit       Cardiology Problems   Coronary artery disease involving native coronary artery of native heart without angina pectoris - Primary (Chronic)    No anginal chest pain with rest or exertion.  He never really ever did have a good episode of anginal chest pain.  He really just noted some exertional dyspnea.  We had a Myoview was negative in July 2020-this showed notable improvement since the PCI in 2018.  He tolerated to prolonged massive hospitalizations last fall and winter without any drop in EF or adverse cardiac events.  Overall stable.   Plan: Continue with low-dose calcium channel blocker in lieu of beta-blocker given normal EF. Not on beta-blocker because of fatigue. Continue rosuvastatin Continue maintenance dose clopidogrel for overlapping LAD stents and RCA stent.         Wide-complex tachycardia - WCT (HCC) (Chronic)    No further episodes.  Nothing on monitor recently.  Palpitations seem to be well controlled.  I wonder if that 1 episode he had lasting 10 to 15 seconds could have been a brief burst of either SVT or wide-complex tachycardia.  The timing and duration would have been right.  Also the fact that it happened when he was startled would make sense. He did not have a lot of PVCs on monitor so is more likely that it would be an SVT then wide-complex.  Did not do well on beta-blocker or antiarrhythmics.  Continue low-dose diltiazem.  Has not required as needed doses but still has them as needed.      Hyperlipidemia LDL goal <70 (Chronic)    Should be  due for labs recheck soon by PCP.  Last check was October 2021 the LDL was 73.  He is on 20 mg rosuvastatin and tolerating it well.  No change for now.      Hypotension (Chronic)    Finally resolved.  No longer on Florinef or midodrine.  He tolerated the short acting diltiazem so I changed to once daily diltiazem XT 120 mg.  Tolerating well.        Other   Presence of drug coated stent in LAD (overlapping) & RCA  ARTERIES (Chronic)    On lifelong maintenance dose Plavix 75 mg  Okay to hold 5 days preop for surgery or procedures.  7 days for more high risk procedures such as spinal neuro procedures.      Exercise intolerance (Chronic)    Gradually improving.  Energy is improving.  Would not push medications any further.  He seems to be progressing nicely.      ===================================  HPI:    Alexander Armstrong is a 80 y.o. male with a PMH notable for CAD-PCI, history of WCT (now only on low-dose diltiazem with no recurrent episodes), HLD along with severe COVID-19 infection with multiple comorbidities followed by bilateral pneumonia from August through December 2021--January 2022 who presents today for 49-monthfollow-up.  Anterior STEMI January 2006 (per pt - never truly had CP - mostly dyspnea):  LAD PCI, staged PCI RCA Documented WIDE-COMPLEX TACHYCARDIA on loop recorder for  evaluation of syncope (end of life of second recorder) -> initially maintained on flecainide d/c 2/2  H/O CAD  02/2019: intolerance of both mexiletine and amiodarone --> noted improvement of "strange palpitation / dizziness Sx previously noted & felt to to be arrhythmia mediated --> Noted easier fatigue (tiring) with less activity. Now on low-dose diltiazem only. ->  Notably improved symptoms with no recurrent arrhythmia.  (Had been held due to COVID-19 infection followed by bilateral pneumonia) Event monitor February 2022: Sinus Rhythm with 1 AVB: Heart rate ranged from 57 -132 bpm, average heart rate  90 bpm.  3 episodes of SVT-fastest and longest 16 beats 187-200 bpm.  Also noted ectopic atrial rhythm along with Darden Amber Mobitz type I block-minimal heart rate 33 bpm at 6:50 AM.  (Had symptoms with this) rare isolated PACs and occasional (1.6%) PVCs. Transfered Care to [facility] in early 2018--Baseline Myoview ordered 12/2016, Myoview -> SIGNIFICANT ANTERIOR DEFECT WITH REVERSIBILITY ->  CATH found severe ISR of LAD stent and patent RCA stent --> April 2018 DES PCI LAD  04/2019, Myoview: Normal EF.  Very small size, mild intensity fixed anteroapical/septal defect-likely representing old anterior infarct.  No ischemia.  LOW RISK COVID-19 infection-prolonged hospitalization 8/12-9/07/2020-[facility] -> acute hypoxic respite failure (never intubated), complicated by severe orthostatic hypotension/SIADH -> all BP medications were held, had been placed on combination Florinef and midodrine.  Despite prolonged difficult hospitalization, no cardiac issues Multifocal PNA/Sepsis (MSSA bacteremia) December 21-January 22: Initially thought to be a viral pneumonia, finally treated with doxycycline with no improvement.  Finally admitted January 3 through 13 at [facility].  Temperature 104, cough and myalgias. Echo negative for endocarditis.  Treated with Vanco and Zosyn, converted to Ceftin to complete home IV antibiotics through February 4   Alexander Armstrong was last seen on January 08, 2021: This is a 91-monthfollow-up to reassess his symptoms while recovering from his prolonged hospitalizations, and to discuss event monitor.  I had started him back on low-dose diltiazem 60 mg 4 times a day after his TAVR second visit.  At that time he was just noticing feeling a little bit exhausted and fatigued.  Still on home oxygen. ->  In April he noted feeling a whole lot better.  Energy level improving.  No more syncope or near syncope symptoms.  Blood pressures were more stabilized.  Was coming off of  midodrine.  No longer on home O2.  Not yet back to full activity level but trying to get there.  Starting to work in [employer]  Still noted some exertional dyspnea and palpitations with rapid heart rates.  No chest pain, PND or orthopnea.  Was still 10 pounds down from regular on weight.  Minimal positional dizziness. Restarted diltiazem XT 120 mg daily with 6 mg short acting diltiazem PRN for breakthrough palpitations  Recent Hospitalizations: None  Reviewed  CV studies:    The following studies were reviewed today: (if available, images/films reviewed: From Epic Chart or Care Everywhere) None:   Interval History:   CRuxin Dasilvareturns here today for 241-monthollowing up doing okay.  Still a little bit wobbly and weak, but getting stronger all the time.  Energy level is still down and still little weak.  Balance is still little off, but stable enough for him to do routine work now.  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  He is working very hard along with his wife BoColbert Coyeror  their daughter's second marriage.  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  He said that 2 weeks ago he had a weird episode that did not last very long.  He said that he was taking his nap and was startled awake by his wife who called out his name.  He was very startled and and woke up abruptly.  He thinks he must of been scared because he had about a 10 to 15-second episode of intense squeezing pain that went away once he was able to get his thoughts.  For several hours after that he felt little bit tired and worn out and took another day for him to get back to full faculties.  But since then he has been doing fine has not had any further symptoms.  No exertional chest pain or pressure.  His exertional dyspnea is also profoundly improved. He is not really doing a lot of walking yet, because  his balance is still off.  CV Review of Symptoms (Summary) Cardiovascular ROS: positive for - he notes that he is still a little bit unsteady on his feet with poor bit balance.  He has a vague exercise intolerance still with exertional dyspnea but this is definitely improved.  Energy level definitely better, but still not back to baseline.  He says he is maybe back to 90%.  1 brief episode of chest pain lasting 10 to 15 seconds followed by also day of feeling poorly. negative for - edema, irregular heartbeat, orthopnea, palpitations, paroxysmal nocturnal dyspnea, rapid heart rate, shortness of breath, or lightheadedness or dizziness-just poor balance.  No further syncope/near syncope or orthostasis symptoms.  No TIA or amaurosis fugax, claudication.  REVIEWED OF SYSTEMS   Review of Systems  Constitutional:  Positive for malaise/fatigue (May be about 90% energy level.  Notably improved but still down.  Mild exertional dyspnea and exercise intolerance.). Negative for chills, fever and weight loss (He thinks he may actually gained back more weight than he wanted.).  HENT:  Negative for congestion and nosebleeds.   Respiratory:  Positive for shortness of breath (Only when he really pushes it). Negative for cough (No longer coughing) and wheezing.   Cardiovascular:  Negative for leg swelling.       Per HPI  Gastrointestinal:  Negative for abdominal pain, blood in stool, melena and vomiting.  Genitourinary:  Negative for hematuria.  Musculoskeletal: Negative.  Negative for falls, joint pain and myalgias.  Neurological:  Negative for dizziness (More related to balance being off) and weakness (Getting stronger by the day).  Psychiatric/Behavioral: Negative.     I have reviewed and (if needed) personally updated the patient's problem list, medications, allergies, past medical and surgical history, social and family history.   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 Ant Stemi (11/03/04) mLAD - PCI: Taxus DES 3.0 x 20; b. (11/06/04) staged RCA DES PCI: Taxus DES 3.5 x 16; c. (01/2017) High Risk Myoview (~fixed anterior defect with normal WM) @ [facility] (Dr. [name]) Synergy DES 3.0 x 12 (for 99% lesion @ prox edge of old stent   Cardiac syncope    No episode since starting flecainide. Had documented WIDE COMPLEX Tachycardia on loop recorder.   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 Unable to induce during EP study.;  Due to CAD, flecainide discontinued -> intolerant of both mexiletine and amiodarone -> Dr. Curt Bears (EP-Cards) d/c'd mexilitine & statrted low dose Diltizem XT; Holter Monitor 12/2018 - HR 47-114, rate PACs/PVCs, no Afib/flutter or SVT, VT.    Reflux gastritis    STEMI involving left anterior descending coronary artery ([place]) 11/03/2004   [facility]: Occluded LAD treated with DES. Staged PCI to the RCA.    PAST SURGICAL HISTORY   Past Surgical History:  Procedure Laterality Date   CARDIAC CATHETERIZATION  ~2011   [facility]: re-look Cath - patent stents   CORONARY STENT INTERVENTION N/A 01/27/2017   Procedure: Coronary Stent Intervention;  Surgeon: [name], MD;  Location: [place] INVASIVE CV LAB: mLAD (just after D1) overlapping prior DES -> SYNERGY DES 3X12 drug eluting stent   ELECTROPHYSIOLOGIC STUDY  2014   Unable to stimulate VT seen on loop recorder   HOLTER MONITOR  12/2018    (Off of mexiletine, only on diltiazem) heart rate ranged from 47 to 114 bpm.  Rare PACs and PVCs.  Sinus rhythm noted no A. fib or other arrhythmia.   implanted loop heart monitor x2  ~2011; 2014    Per report- batter out of date; apparently captured a prolonged episode of wide complex tachycardia associated with an episode of weakness/near syncope   INTRAVASCULAR PRESSURE WIRE/FFR STUDY N/A 01/27/2017   Procedure: Intravascular Pressure Wire/FFR Study;  Surgeon: [name], MD;  Location: [place] CV LAB;  Service: Cardiovascular;  Laterality: N/A;   LEFT HEART CATH AND CORONARY ANGIOGRAPHY N/A 01/27/2017   Procedure: Left Heart Cath and Coronary Angiography;  Surgeon: [name], MD;  Location: [facility] INVASIVE CV LAB: mLAD 99% stenosis pre-Stent & ISR --> PCI. Ost DI ~50%. pRCA DES ~50-60% FFR 0.86.   NM MYOVIEW LTD  07/2014   Unremarkable EKG. No evidence of ischemia or infarct. Mild anterior attenuation, cannot exclude prior infarct, but nonreversible. EF 66%.   NM MYOVIEW LTD  12/22/2016   INTERMEDIATE RISK. EF 58%. Defect 1: Medium size severe defect in the mid anterior, apical anterior and apical wall consistent with prior anterior MI. Defect to: Large defect and moderate severity in the basal inferoseptal basal inferior and inferoseptal mid inferior wall consistent with ischemia.   NM MYOVIEW LTD  04/05/2019   EF 55 to 65%.  Small size, mild severity fixed perfusion defect in the mid-apical anterior septal wall.  Likely represents old anterior infarct.   No evidence of ischemia. LOW RISK.    PERCUTANEOUS CORONARY STENT INTERVENTION (PCI-S)  January-February 2006   a. mLAD - Taxus DES 3.0 x 20; b. (11/06/04) staged PCI to RCA Taxus DES 3.5 x 16; c. 01/27/2017: PCI to mLAD prox to old  stent - Synergy DES 3.0 x 12   TRANSTHORACIC ECHOCARDIOGRAM  10/11/2020    [facility]):  Normal LV size & function. EF 55-60%. NO RWMA. Normal filling parameters.  Mild TR however estimated moderate pulmonary hypertension with RVSP 52 mmHg.  IVC is mildly dilated.  No vegetations noted.  No significant change from last study.   TRANSTHORACIC ECHOCARDIOGRAM  05/2020   [facility]: 05/21/2020: Normal  LV size and function.  Normal  valves.  No effusion.; 06/04/2020: Normal LV size and function EF 60 to 65%.  Trace MR and TR.  No pulmonary hypertension.  Stable   UMBILICAL HERNIA REPAIR       Most Recent Cath -> (01/2017).  @ [facility] (Dr. [name]) CATH-PCI LAD: Prox RCA 55% ISR (FFR 0.86), 50% D1 @ bifurcation. Synergy DES 3.0 x 12 (for 99% mLAD lesion @ prox edge of old stent) - overlaps old stent with PTCA of old stent ISR>              Immunization History  Administered Date(s) Administered   Influenza, High Dose Seasonal PF 08/03/2018   Influenza-Unspecified 06/17/2012   Pneumococcal Conjugate-13 03/20/2014   Pneumococcal Polysaccharide-23 05/06/2011   Td 12/23/2009   Zoster, Live 02/23/2013    MEDICATIONS/ALLERGIES   Current Meds  Medication Sig   acetaminophen (TYLENOL) 500 MG tablet Take 1,000 mg by mouth 3 (three) times daily as needed for moderate pain or headache.   Cholecalciferol (VITAMIN D3) 1000 units CAPS Take 1,000 Units by mouth at bedtime.   clopidogrel (PLAVIX) 75 MG tablet TAKE 1 TABLET(75 MG) BY MOUTH DAILY   diltiazem (DILACOR XR) 120 MG 24 hr capsule Take 1 capsule (120 mg total) by mouth daily.   oxybutynin (DITROPAN) 5 MG tablet Take 5 mg by mouth daily.   pantoprazole (PROTONIX) 40 MG tablet Take 40 mg by mouth daily.   rosuvastatin (CRESTOR) 20 MG tablet Take 20 mg by mouth daily.    Allergies  Allergen Reactions   Silver Sulfadiazine Hives and Rash   Latex Other (See Comments)    "takes the skin off"   Adhesive [Tape] Rash   Hydrocodone Rash    SOCIAL HISTORY/FAMILY HISTORY   Reviewed in Epic:  Pertinent findings:  Social History   Tobacco Use   Smoking status: Never   Smokeless tobacco: Never  Vaping Use   Vaping Use: Never used  Substance Use Topics   Alcohol use: No   Drug use: No   Social History   Social History Narrative   Not on file    OBJCTIVE -PE, EKG, labs   Wt Readings from Last 3 Encounters:  06/19/21 194 lb 6.4 oz  (88.2 kg)  01/08/21 176 lb (79.8 kg)  11/06/20 171 lb (77.6 kg)    Physical Exam: BP 110/60 (BP Location: Left Arm)   Pulse 65   Ht '5\' 11"'$  (1.803 m)   Wt 194 lb 6.4 oz (88.2 kg)   SpO2 98%   BMI 27.11 kg/m  Physical Exam Constitutional:      General: He is not in acute distress.    Appearance: Normal appearance. He is normal weight. He is not ill-appearing or toxic-appearing.     Comments: More robust, healthy appearing.  Almost back to his baseline self.  HENT:     Head: Normocephalic and atraumatic.  Neck:     Vascular: No carotid bruit.  Cardiovascular:     Rate and Rhythm: Normal rate and regular rhythm.     Pulses: Normal pulses.     Heart sounds: Normal heart sounds. No murmur heard.   No friction rub. No gallop.  Pulmonary:     Effort: Pulmonary effort is normal. No respiratory distress.     Breath sounds: Normal breath sounds. No wheezing, rhonchi or rales.  Musculoskeletal:        General: No swelling. Normal range of motion.     Cervical back: Normal range of motion and neck supple.  Skin:    General: Skin is warm and dry.     Coloration: Skin is pale (May be a little bit pale).  Neurological:     General: No focal deficit present.     Mental Status: He is alert and oriented to person, place, and time. Mental status is at baseline.     Gait: Gait normal.  Psychiatric:        Mood and Affect: Mood normal.        Behavior: Behavior normal.        Thought Content: Thought content normal.        Judgment: Judgment normal.     Adult ECG Report Not checked  Recent Labs: Reviewed from care everywhere [place] Related to Iron Profile Component 06/10/21  05/16/20  05/16/20   Transferrin 190 Low  181 Low  --  Iron 98 23 Low  --  Ferritin 341 High  -- 1,382 High   TIBC 266 253 Low  --  UIBC 181 -- --  % Saturation 37 --     CBC and Differential Component 06/10/21  02/10/21  11/11/20   WBC 7.9 7.1 12.7 High   RBC 3.32 Low  3.62  Low  3.27 Low   Hemoglobin 10.5 Low  11.2 Low  9.6 Low   Hematocrit 30.5 Low  33.1 Low  28.8 Low   MCV 91.8 91.4 88.1  MCH 31.7 30.9 29.3  MCHC 34.6 33.8 33.3  RDW 13.7 13.8 14.5  Platelets 148 Low  167 314  Vit B12 210    Hgb A1c   6.1 High  6.8  High    Basic Metabolic Panel Component [id number]  11/11/20  10/16/20   Sodium 136 133 Low  132 Low   Potassium 4.2 3.8 3.7  Chloride 103 99 99  CO2 '29 27 29  '$ BUN '23 14 18  '$ Glucose 107 High   120 High   165 High    Creatinine 1.26 1.28 1.08  Calcium 9.3 9.0 7.8 Low   Anion Gap '4 7 4  '$ Est. GFR 58 Low   57 Low   70     Lab Results  Component Value Date   CREATININE 1.06 01/27/2017   BUN 18 01/27/2017   NA 137 01/27/2017   K 4.0 01/27/2017   CL 106 01/27/2017   CO2 23 01/27/2017   CBC Latest Ref Rng & Units 01/21/2017 04/05/2012  WBC 3.8 - 10.8 K/uL 8.6 6.3  Hemoglobin 13.2 - 17.1 g/dL 13.1(L) 13.9  Hematocrit 38.5 - 50.0 % 38.7 41.4  Platelets 140 - 400 K/uL 205 182.0    No results found for: HGBA1C Lab Results  Component Value Date   TSH 2.44 04/05/2012    ==================================================  COVID-19 Education: The signs and symptoms of COVID-19 were discussed with the patient and how to seek care for testing (follow up with PCP or arrange E-visit).    I spent a total of 25 minutes with the patient spent in direct patient consultation.  Additional time spent with chart review  / charting (studies, outside notes, etc): 20 min Total Time: 45 min  Current medicines are reviewed at length with the patient today.  (+/- concerns) n/a  This visit occurred during the SARS-CoV-2 public health emergency.  Safety protocols were in place, including screening questions prior to the visit, additional usage of staff PPE, and extensive cleaning of exam room while observing appropriate contact time as indicated for disinfecting solutions.  Notice:  This dictation was prepared with Dragon dictation along with smaller  phrase technology. Any transcriptional errors that result from this process are unintentional and may not be corrected upon review.  Patient Instructions / Medication Changes & Studies & Tests Ordered   Patient Instructions  Medication Instructions:  Continue same medications *If you need a refill on your cardiac medications before your next appointment, please call your pharmacy*   Lab Work: None ordered   Testing/Procedures: None ordered   Follow-Up: At [facility], you and your health needs are our priority.  As part of our continuing mission to provide you with exceptional heart care, we have created designated Provider Care Teams.  These Care Teams include your primary Cardiologist (physician) and Advanced Practice Providers (APPs -  Physician Assistants and Nurse Practitioners) who all work together to provide you with the care you need, when you need it.  We recommend signing up for the patient portal called "MyChart".  Sign up information is provided on this After Visit Summary.  MyChart is used to connect with patients for Virtual Visits (Telemedicine).  Patients are able to view lab/test results, encounter notes, upcoming appointments, etc.  Non-urgent messages can be sent to your provider as well.   To learn more about what you can do with MyChart, go to [email or web address].     Your next appointment:  6 months    Monday 12/29/20 at 11:40 am    The format for your next appointment: Office   Provider:  Dr.Nishan Ovens      Studies Ordered:   No orders of the defined types were placed in this encounter.    [name], M.D., M.S. Interventional Cardiologist   Pager # [phone] Phone # [phone] [address] [place], [place] [address]   Thank you for choosing Heartcare at [facility]!!

## 2021-06-19 NOTE — Patient Instructions (Signed)
Medication Instructions:  Continue same medications *If you need a refill on your cardiac medications before your next appointment, please call your pharmacy*   Lab Work: None ordered   Testing/Procedures: None ordered   Follow-Up: At [facility], you and your health needs are our priority.  As part of our continuing mission to provide you with exceptional heart care, we have created designated Provider Care Teams.  These Care Teams include your primary Cardiologist (physician) and Advanced Practice Providers (APPs -  Physician Assistants and Nurse Practitioners) who all work together to provide you with the care you need, when you need it.  We recommend signing up for the patient portal called "MyChart".  Sign up information is provided on this After Visit Summary.  MyChart is used to connect with patients for Virtual Visits (Telemedicine).  Patients are able to view lab/test results, encounter notes, upcoming appointments, etc.  Non-urgent messages can be sent to your provider as well.   To learn more about what you can do with MyChart, go to [email or web address].     Your next appointment:  6 months    Monday 12/29/20 at 11:40 am    The format for your next appointment: Office   Provider:  Dr.Harding

## 2021-06-30 ENCOUNTER — Encounter: Payer: Self-pay | Admitting: Cardiology

## 2021-06-30 NOTE — Assessment & Plan Note (Signed)
No anginal chest pain with rest or exertion.  He never really ever did have a good episode of anginal chest pain.  He really just noted some exertional dyspnea.  We had a Myoview was negative in July 2020-this showed notable improvement since the PCI in 2018.  He tolerated to prolonged massive hospitalizations last fall and winter without any drop in EF or adverse cardiac events.  Overall stable.   Plan: Continue with low-dose calcium channel blocker in lieu of beta-blocker given normal EF.  Not on beta-blocker because of fatigue.  Continue rosuvastatin  Continue maintenance dose clopidogrel for overlapping LAD stents and RCA stent.

## 2021-06-30 NOTE — Assessment & Plan Note (Signed)
Should be due for labs recheck soon by PCP.  Last check was October 2021 the LDL was 73.  He is on 20 mg rosuvastatin and tolerating it well.  No change for now.

## 2021-06-30 NOTE — Assessment & Plan Note (Signed)
Finally resolved.  No longer on Florinef or midodrine.  He tolerated the short acting diltiazem so I changed to once daily diltiazem XT 120 mg.  Tolerating well.

## 2021-06-30 NOTE — Assessment & Plan Note (Signed)
No further episodes.  Nothing on monitor recently.  Palpitations seem to be well controlled.  I wonder if that 1 episode he had lasting 10 to 15 seconds could have been a brief burst of either SVT or wide-complex tachycardia.  The timing and duration would have been right.  Also the fact that it happened when he was startled would make sense. He did not have a lot of PVCs on monitor so is more likely that it would be an SVT then wide-complex.  Did not do well on beta-blocker or antiarrhythmics.  Continue low-dose diltiazem.  Has not required as needed doses but still has them as needed.

## 2021-06-30 NOTE — Assessment & Plan Note (Signed)
On lifelong maintenance dose Plavix 75 mg  Okay to hold 5 days preop for surgery or procedures.  7 days for more high risk procedures such as spinal neuro procedures.

## 2021-06-30 NOTE — Assessment & Plan Note (Signed)
Gradually improving.  Energy is improving.  Would not push medications any further.  He seems to be progressing nicely.

## 2021-08-26 ENCOUNTER — Other Ambulatory Visit: Payer: Self-pay

## 2021-08-26 ENCOUNTER — Ambulatory Visit (INDEPENDENT_AMBULATORY_CARE_PROVIDER_SITE_OTHER): Payer: Medicare Other | Admitting: Cardiology

## 2021-08-26 ENCOUNTER — Encounter: Payer: Self-pay | Admitting: Cardiology

## 2021-08-26 VITALS — BP 110/60 | HR 56 | Ht 71.0 in | Wt 195.2 lb

## 2021-08-26 DIAGNOSIS — I2510 Atherosclerotic heart disease of native coronary artery without angina pectoris: Secondary | ICD-10-CM | POA: Diagnosis not present

## 2021-08-26 DIAGNOSIS — I252 Old myocardial infarction: Secondary | ICD-10-CM

## 2021-08-26 DIAGNOSIS — R002 Palpitations: Secondary | ICD-10-CM | POA: Diagnosis not present

## 2021-08-26 DIAGNOSIS — R6889 Other general symptoms and signs: Secondary | ICD-10-CM | POA: Diagnosis not present

## 2021-08-26 DIAGNOSIS — Z955 Presence of coronary angioplasty implant and graft: Secondary | ICD-10-CM | POA: Diagnosis not present

## 2021-08-26 DIAGNOSIS — E785 Hyperlipidemia, unspecified: Secondary | ICD-10-CM | POA: Diagnosis not present

## 2021-08-26 DIAGNOSIS — I4729 Other ventricular tachycardia: Secondary | ICD-10-CM

## 2021-08-26 MED ORDER — PANTOPRAZOLE SODIUM 40 MG PO TBEC: 40.0000 mg | DELAYED_RELEASE_TABLET | Freq: Every day | ORAL | 3 refills | Status: DC

## 2021-08-26 NOTE — Patient Instructions (Signed)
Medication Instructions:  NO CHANGES   *If you need a refill on your cardiac medications before your next appointment, please call your pharmacy*   Lab Work:  NOT NEEDED   Testing/Procedures:  NOT NEEDED  Follow-Up: At [facility], you and your health needs are our priority.  As part of our continuing mission to provide you with exceptional heart care, we have created designated Provider Care Teams.  These Care Teams include your primary Cardiologist (physician) and Advanced Practice Providers (APPs -  Physician Assistants and Nurse Practitioners) who all work together to provide you with the care you need, when you need it.     Your next appointment:   6 month(s)  The format for your next appointment:   In Person  Provider:   [name], MD

## 2021-08-26 NOTE — Progress Notes (Signed)
Primary Care Provider: [name], MD Cardiologist: Alexander Hew, MD Electrophysiologist: Alexander [name], MD  Clinic Note:  Chief Complaint  Patient presents with   Follow-up    Doing well.  Still has occasional twinging symptoms in his chest, nothing worrisome.  Overall stronger.   Coronary Artery Disease    No angina   Palpitations    Much better on current dose of diltiazem.  Has not had to use.    ===================================  ASSESSMENT/PLAN   Problem List Items Addressed This Visit       Cardiology Problems   Coronary artery disease involving native coronary artery of native heart without angina pectoris (Chronic)    He never really had true anginal symptoms despite having two-vessel disease.  When we did his last PCI of the LAD, he had no symptoms.  He had some exertional dyspnea.  Negative Myoview in July 2022.  Normal echocardiogram. Not on beta-blocker because of fatigue.  Converted to diltiazem XT (acceptable with preserved EF)  On maintenance dose Plavix statin.-Okay to hold 5 to 7 days preop for surgical procedures.      Relevant Orders   EKG 12-Lead (Completed)   Hyperlipidemia LDL goal <70 - Primary (Chronic)    LDL is been relatively well controlled on current dose of statin.  We need to get labs from PCP.  If not checked, we can recheck at follow-up.      Paroxysmal VT (Chronic)    Has not had any recurrent symptoms suggest VTE.  If possible that this short episode he had a few months ago may have been related to that, but no recurrent symptoms.  Did not tolerate antiarrhythmics.  Did not tolerate beta-blocker because of bradycardia, and fatigue.  On stable dose of diltiazem 120 mg daily.  Additional doses.  Not require them.        Other   Presence of drug coated stent in LAD (overlapping) & RCA  ARTERIES (Chronic)    On lifelong maintenance dose Plavix 75 mg.  Last PCI was 2018.  Okay to hold Plavix 5 to 7 days preop for  surgeries or procedures.      Exercise intolerance (Chronic)   Relevant Orders   EKG 12-Lead (Completed)   History of acute anterior wall MI (Chronic)   Relevant Orders   EKG 12-Lead (Completed)   Palpitations (Chronic)    Monitor did not show any signs of VT, A. fib or SVT.  Mild ectopic atrial runs.  Doing well on current dose of diltiazem XT 120 mg.  Not requiring additional PRN dosing.  Plan: Continue standing dose of diltiazem 120 mg XT.          Relevant Orders   EKG 12-Lead (Completed)   ===================================  HPI:    Alexander Armstrong is a 80 y.o. male with a PMH notable for CAD-PCI, history of WCT (now only on low-dose diltiazem with no recurrent episodes), HLD along with severe COVID-19 infection with multiple comorbidities followed by bilateral pneumonia from August through December 2021--January 2022 who presents today for 29-month follow-up.  Anterior STEMI January 2006 (per pt - never truly had CP - mostly dyspnea):  LAD PCI, staged PCI RCA Documented WIDE-COMPLEX TACHYCARDIA on loop recorder for evaluation of syncope (end of life of second recorder) -> initially maintained on flecainide d/c 2/2  H/O CAD  02/2019: intolerance of both mexiletine and amiodarone --> noted improvement of "strange palpitation / dizziness Sx previously noted & felt to to be  arrhythmia mediated --> Noted easier fatigue (tiring) with less activity. Now on low-dose diltiazem only. ->  Notably improved symptoms with no recurrent arrhythmia.  (Had been held due to COVID-19 infection followed by bilateral pneumonia) Transfered Care to [facility] in early 2018--Baseline Myoview ordered 12/2016, Myoview -> SIGNIFICANT ANTERIOR DEFECT WITH REVERSIBILITY ->  CATH found severe ISR of LAD stent and patent RCA stent --> April 2018 DES PCI LAD  04/2019, Myoview: Normal EF.  Very small size, mild intensity fixed anteroapical/septal defect-likely representing old anterior infarct.  No ischemia.   LOW RISK COVID-19 infection-prolonged hospitalization 8/12-9/07/2020-[facility] -> acute hypoxic respite failure (never intubated), complicated by severe orthostatic hypotension/SIADH -> all BP medications were held, had been placed on combination Florinef and midodrine.  Despite prolonged difficult hospitalization, no cardiac issues Multifocal PNA/Sepsis (MSSA bacteremia) December 21-January 22: Initially thought to be a viral pneumonia, finally treated with doxycycline with no improvement.  Finally admitted January 3 through 13 at [facility].  Temperature 104, cough and myalgias.  Alexander Armstrong was last seen on June 19, 2021 as a 67-month follow-up from his April visit.  In April, I converted him to diltiazem 120 mg daily with PRN 60 mg tablets for breakthrough palpitations.  He was finally started to feel fully recovered from his long-term hospitalizations.  Still little bit wobbly and weak but getting stronger every day.  Closer to 90%.  Back in the warehouse working.   He indicated having a weird episode 2 weeks prior to this visit with 10 to 15-second episode of squeezing tightness in his chest that woke him up abruptly.  He thought maybe he was having a heart attack, but may very well of been having a nightmare.  He was living confused after this.  He was quite a bit confused for a little bit afterwards.  Since then no further episodes.  No exertional chest pain or pressure.  Exertional dyspnea notably improved.  Recent Hospitalizations: None  Reviewed  CV studies:    The following studies were reviewed today: (if available, images/films reviewed: From Epic Chart or Care Everywhere) None:   Interval History:   Alexander Armstrong returns here today for 3 month follow-up doing well.  I think this had been appointment that was scheduled before his September visit and was never canceled.  He is doing fine.  He says he has off-and-on little twinges of chest discomfort but no  change from his baseline.  Nothing that makes him concerned.  Overall he continues to feel better and is happy to be over a year out from his prolonged hospitalization for COVID.  His strength and energy level is back to baseline.  CV Review of Symptoms (Summary) Cardiovascular ROS: positive for - he notes that still has somewhat poor balance, but is much more steady on his feet.. Energy level definitely better, but still not back to baseline.  He says he is maybe back to 95%.  Still a little bit of exercise intolerance.  GERD pain.  Also off-and-on mild twinges of chest discomfort not associated with activity. negative for - edema, irregular heartbeat, orthopnea, palpitations, paroxysmal nocturnal dyspnea, rapid heart rate, shortness of breath, or lightheadedness or dizziness-just poor balance.  No further syncope/near syncope or orthostasis symptoms.  No TIA or amaurosis fugax, claudication.  REVIEWED OF SYSTEMS   Review of Systems  Constitutional:  Negative for chills, fever, malaise/fatigue (Back to 95% baseline.[name]) and weight loss (He thinks he may actually gained back more weight  than he wanted.).  HENT:  Negative for congestion and nosebleeds.   Respiratory:  Positive for shortness of breath (Only when he really pushes it). Negative for cough (No longer coughing) and wheezing.   Cardiovascular:  Positive for chest pain (Mild twinges off-and-on-not associate with activity.). Negative for leg swelling.       Per HPI  Gastrointestinal:  Negative for abdominal pain, blood in stool, melena and vomiting.  Genitourinary:  Negative for hematuria.  Musculoskeletal: Negative.  Negative for falls, joint pain and myalgias.  Neurological:  Negative for dizziness (More related to balance being off), focal weakness and weakness (Almost back to full strength now.).  Psychiatric/Behavioral: Negative.  Negative for depression and memory loss. The patient is not nervous/anxious and does not have insomnia  (Definitely sleeping better.).    I have reviewed and (if needed) personally updated the patient's problem list, medications, allergies, past medical and surgical history, social and family history.   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 Ant Stemi (11/03/04) mLAD - PCI: Taxus DES 3.0 x 20; b. (11/06/04) staged RCA DES PCI: Taxus DES 3.5 x 16; c. (01/2017) High Risk Myoview (~fixed anterior defect with normal WM) @ [facility] (Dr. [name]) Synergy DES 3.0 x 12 (for 99% lesion @ prox edge of old stent   Cardiac syncope    No episode since starting flecainide. Had documented WIDE COMPLEX Tachycardia on loop recorder.   Cataract bilateral cataracts   Diverticulosis    Esophageal stricture    GERD (gastroesophageal reflux disease)    Hiatal hernia 11/05/2010   HLD (hyperlipidemia)    Nonsustained paroxysmal ventricular tachycardia 2014   Initially controlled with flecainide. Unable to induce during EP study.;  Due to CAD, flecainide discontinued -> intolerant of both mexiletine and amiodarone -> Dr. Curt Bears (EP-Cards) d/c'd mexilitine & statrted low dose Diltizem XT; Holter Monitor 12/2018 - HR 47-114, rate PACs/PVCs, no Afib/flutter or SVT, VT.    Reflux gastritis    STEMI involving left anterior descending coronary artery ([place]) 11/03/2004   [facility]: Occluded LAD treated with DES. Staged PCI to the RCA.    PAST SURGICAL HISTORY   Past Surgical History:  Procedure Laterality Date   CARDIAC CATHETERIZATION  ~2011   [facility]: re-look Cath - patent stents   CORONARY STENT INTERVENTION N/A 01/27/2017   Procedure: Coronary Stent Intervention;  Surgeon: [name], MD;  Location: [place] INVASIVE CV LAB: mLAD (just after D1) overlapping prior DES -> SYNERGY DES 3X12 drug eluting stent   ELECTROPHYSIOLOGIC STUDY  2014    Unable to stimulate VT seen on loop recorder   HOLTER MONITOR  12/2018    (Off of mexiletine, only on diltiazem) heart rate ranged from 47 to 114 bpm.  Rare PACs and PVCs.  Sinus rhythm noted no A. fib or other arrhythmia.   implanted loop heart monitor x2  ~2011; 2014   Per report- batter out of date; apparently captured a prolonged episode of wide complex tachycardia associated with an episode of weakness/near syncope   INTRAVASCULAR PRESSURE WIRE/FFR STUDY N/A 01/27/2017   Procedure: Intravascular Pressure Wire/FFR Study;  Surgeon: [name], MD;  Location: [place] CV LAB;  Service: Cardiovascular;  Laterality: N/A;   LEFT HEART CATH AND CORONARY ANGIOGRAPHY N/A 01/27/2017  Procedure: Left Heart Cath and Coronary Angiography;  Surgeon: [name], MD;  Location: [facility] INVASIVE CV LAB: mLAD 99% stenosis pre-Stent & ISR --> PCI. Ost DI ~50%. pRCA DES ~50-60% FFR 0.86.   NM MYOVIEW LTD  07/2014   Unremarkable EKG. No evidence of ischemia or infarct. Mild anterior attenuation, cannot exclude prior infarct, but nonreversible. EF 66%.   NM MYOVIEW LTD  12/22/2016   INTERMEDIATE RISK. EF 58%. Defect 1: Medium size severe defect in the mid anterior, apical anterior and apical wall consistent with prior anterior MI. Defect to: Large defect and moderate severity in the basal inferoseptal basal inferior and inferoseptal mid inferior wall consistent with ischemia.   NM MYOVIEW LTD  04/05/2019   EF 55 to 65%.  Small size, mild severity fixed perfusion defect in the mid-apical anterior septal wall.  Likely represents old anterior infarct.   No evidence of ischemia. LOW RISK.    PERCUTANEOUS CORONARY STENT INTERVENTION (PCI-S)  January-February 2006   a. mLAD - Taxus DES 3.0 x 20; b. (11/06/04) staged PCI to RCA Taxus DES 3.5 x 16; c. 01/27/2017: PCI to mLAD prox to old  stent - Synergy DES 3.0 x 12   TRANSTHORACIC ECHOCARDIOGRAM  10/11/2020    [facility]):  Normal LV size & function. EF 55-60%. NO RWMA.  Normal filling parameters.  Mild TR however estimated moderate pulmonary hypertension with RVSP 52 mmHg.  IVC is mildly dilated.  No vegetations noted.  No significant change from last study.   TRANSTHORACIC ECHOCARDIOGRAM  05/2020   [facility]: 05/21/2020: Normal LV size and function.  Normal valves.  No effusion.; 06/04/2020: Normal LV size and function EF 60 to 65%.  Trace MR and TR.  No pulmonary hypertension.  Stable   UMBILICAL HERNIA REPAIR     Event monitor February 2022: Sinus Rhythm with 1 AVB: Heart rate ranged from 57 -132 bpm, average heart rate 90 bpm.  3 episodes of SVT-fastest and longest 16 beats 187-200 bpm.  Also noted ectopic atrial rhythm along with Darden Amber Mobitz type I block-minimal heart rate 33 bpm at 6:50 AM.  (Had symptoms with this) rare isolated PACs and occasional (1.6%) PVCs.   Most Recent Cath -> (01/2017).  @ [facility] (Dr. [name]) CATH-PCI LAD: Prox RCA 55% ISR (FFR 0.86), 50% D1 @ bifurcation. Synergy DES 3.0 x 12 (for 99% mLAD lesion @ prox edge of old stent) - overlaps old stent with PTCA of old stent ISR>              Immunization History  Administered Date(s) Administered   Influenza, High Dose Seasonal PF 08/03/2018   Influenza-Unspecified 06/17/2012   Pneumococcal Conjugate-13 03/20/2014   Pneumococcal Polysaccharide-23 05/06/2011   Td 12/23/2009   Zoster, Live 02/23/2013    MEDICATIONS/ALLERGIES   Current Meds  Medication Sig   acetaminophen (TYLENOL) 500 MG tablet Take 1,000 mg by mouth 3 (three) times daily as needed for moderate pain or headache.   Cholecalciferol (VITAMIN D3) 1000 units CAPS Take 1,000 Units by mouth at bedtime.   clopidogrel (PLAVIX) 75 MG tablet TAKE 1 TABLET(75 MG) BY MOUTH DAILY   diltiazem (CARDIZEM) 60 MG tablet Take 1 tablet (60 mg total) by mouth 4 (four) times daily. As needed for fast heart rate   diltiazem (DILACOR XR) 120 MG 24 hr capsule Take 1 capsule (120 mg total) by mouth daily.   oxybutynin  (DITROPAN) 5 MG tablet Take 5 mg by mouth daily.   rosuvastatin (  CRESTOR) 20 MG tablet Take 20 mg by mouth daily.   [DISCONTINUED] pantoprazole (PROTONIX) 40 MG tablet Take 40 mg by mouth daily.    Allergies  Allergen Reactions   Silver Sulfadiazine Hives and Rash   Latex Other (See Comments)    "takes the skin off"   Adhesive [Tape] Rash   Hydrocodone Rash    SOCIAL HISTORY/FAMILY HISTORY   Reviewed in Epic:  Pertinent findings:  Social History   Tobacco Use   Smoking status: Never   Smokeless tobacco: Never  Vaping Use   Vaping Use: Never used  Substance Use Topics   Alcohol use: No   Drug use: No   Social History   Social History Narrative   Not on file    OBJCTIVE -PE, EKG, labs   Wt Readings from Last 3 Encounters:  08/26/21 195 lb 3.2 oz (88.5 kg)  06/19/21 194 lb 6.4 oz (88.2 kg)  01/08/21 176 lb (79.8 kg)    Physical Exam: BP 110/60 (BP Location: Right Arm)   Pulse (!) 56   Ht 5\' 11"  (1.803 m)   Wt 195 lb 3.2 oz (88.5 kg)   SpO2 98%   BMI 27.22 kg/m  Physical Exam Vitals reviewed.  Constitutional:      General: He is not in acute distress.    Appearance: Normal appearance. He is normal weight. He is not ill-appearing or toxic-appearing.     Comments: Robust, healthy-appearing.  Well-groomed.  More back to his baseline.  HENT:     Head: Normocephalic and atraumatic.  Neck:     Vascular: No carotid bruit.  Cardiovascular:     Rate and Rhythm: Regular rhythm. Bradycardia present.     Pulses: Normal pulses.     Heart sounds: Normal heart sounds. No murmur heard.   No friction rub. No gallop.  Pulmonary:     Effort: Pulmonary effort is normal. No respiratory distress.     Breath sounds: Normal breath sounds. No wheezing, rhonchi or rales.  Musculoskeletal:        General: No swelling. Normal range of motion.     Cervical back: Normal range of motion and neck supple.  Skin:    General: Skin is warm and dry.     Coloration: Skin is not  jaundiced or pale.  Neurological:     General: No focal deficit present.     Mental Status: He is alert and oriented to person, place, and time. Mental status is at baseline.     Gait: Gait normal.  Psychiatric:        Mood and Affect: Mood normal.        Behavior: Behavior normal.        Thought Content: Thought content normal.        Judgment: Judgment normal.     Adult ECG Report Not checked  Recent Labs: No new labs from PCP since September. [place] Related to Iron Profile Component 06/10/21  05/16/20   Transferrin 190 Low  181 Low   Iron 98 23 Low   Ferritin 341 High  --  TIBC 266 253 Low   UIBC 181 --  % Saturation 37 --    CBC and Differential Component 06/10/21  02/10/21  11/11/20   WBC 7.9 7.1 12.7 High   RBC 3.32 Low  3.62 Low  3.27 Low   Hemoglobin 10.5 Low  11.2 Low  9.6 Low   Hematocrit 30.5 Low  33.1 Low  28.8 Low  MCV 91.8 91.4 88.1  MCH 31.7 30.9 29.3  MCHC 34.6 33.8 33.3  RDW 13.7 13.8 14.5  Platelets 148 Low  167 314  Vit B12 210    Hgb A1c   6.1 High  6.8  High    Basic Metabolic Panel Component 28/78/67  11/11/20  10/16/20   Sodium 136 133 Low  132 Low   Potassium 4.2 3.8 3.7  Chloride 103 99 99  CO2 29 27 29   BUN 23 14 18   Glucose 107 High   120 High   165 High    Creatinine 1.26 1.28 1.08  Calcium 9.3 9.0 7.8 Low   Anion Gap 4 7 4   Est. GFR 58 Low   57 Low   70     ==================================================  COVID-19 Education: The signs and symptoms of COVID-19 were discussed with the patient and how to seek care for testing (follow up with PCP or arrange E-visit).    I spent a total of 28 minutes with the patient spent in direct patient consultation.  Additional time spent with chart review  / charting (studies, outside notes, etc): 14 min Total Time: 42 min  Current medicines are reviewed at length with the patient today.  (+/- concerns) n/a  This visit occurred during the SARS-CoV-2 public  health emergency.  Safety protocols were in place, including screening questions prior to the visit, additional usage of staff PPE, and extensive cleaning of exam room while observing appropriate contact time as indicated for disinfecting solutions.  Notice: This dictation was prepared with Dragon dictation along with smaller phrase technology. Any transcriptional errors that result from this process are unintentional and may not be corrected upon review.  Patient Instructions / Medication Changes & Studies & Tests Ordered   Patient Instructions  Medication Instructions:  NO CHANGES   *If you need a refill on your cardiac medications before your next appointment, please call your pharmacy*   Lab Work:  NOT NEEDED   Testing/Procedures:  NOT NEEDED  Follow-Up: At [facility], you and your health needs are our priority.  As part of our continuing mission to provide you with exceptional heart care, we have created designated Provider Care Teams.  These Care Teams include your primary Cardiologist (physician) and Advanced Practice Providers (APPs -  Physician Assistants and Nurse Practitioners) who all work together to provide you with the care you need, when you need it.     Your next appointment:   6 month(s)  The format for your next appointment:   In Person  Provider:   [name], MD      Studies Ordered:   Orders Placed This Encounter  Procedures   EKG 12-Lead      Alexander Armstrong, M.D., M.S. Interventional Cardiologist   Pager # [phone] Phone # [phone] [address]. [place], [place] [address]   Thank you for choosing Heartcare at [facility]!!

## 2021-09-11 ENCOUNTER — Telehealth: Payer: Self-pay | Admitting: *Deleted

## 2021-09-11 NOTE — Telephone Encounter (Signed)
   Patient Name: Alexander Armstrong  DOB: March 30, 1941 MRN: [id number]  Primary Cardiologist: [name], MD  Chart reviewed as part of pre-operative protocol coverage.  Last OV 08/26/21, not yet signed. Per OV, "He said that 2 weeks ago he had a weird episode that did not last very long.  He said that he was taking his nap and was startled awake by his wife who called out his name.  He was very startled and and woke up abruptly.  He thinks he must of been scared because he had about a 10 to 15-second episode of intense squeezing pain that went away once he was able to get his thoughts.  For several hours after that he felt little bit tired and worn out and took another day for him to get back to full faculties.  But since then he has been doing fine has not had any further symptoms.  No exertional chest pain or pressure.  His exertional dyspnea is also profoundly improved. He is not really doing a lot of walking yet, because his balance is still off" Assessment/plan still pending.   Given pending note and recent symptoms will route to MD for medical and pharmacy clearance to hold Plavix for 7 days for TF ESI. Dr. [name] - Please route response to P CV DIV PREOP (the pre-op pool). Thank you.  Charlie Pitter, PA-C 09/11/2021, 4:02 PM

## 2021-09-11 NOTE — Telephone Encounter (Signed)
   [facility] Pre-operative Risk Assessment    Patient Name: Alexander Armstrong  DOB: [birth year] MRN: [id number]     Request for surgical clearance:  What type of surgery is being performed? L [id number]   When is this surgery scheduled? 09/24/21  What type of clearance is required (medical clearance vs. Pharmacy clearance to hold med vs. Both)? medical  Are there any medications that need to be held prior to surgery and how long?  7 days prior - Plavix  Practice name and name of physician performing surgery? [facility]  What is the office phone number? [phone]    7.   What is the office fax number? [phone]  attn clinic  8.   Anesthesia type (None, local, MAC, general) ? unknown   Alexander Armstrong 09/11/2021, 10:36 AM  _________________________________________________________________   (provider comments below)

## 2021-09-13 NOTE — Telephone Encounter (Signed)
Okay to hold Plavix 7 days preop for surgery or procedure.  [name], MD

## 2021-09-14 ENCOUNTER — Encounter: Payer: Self-pay | Admitting: Cardiology

## 2021-09-14 NOTE — Assessment & Plan Note (Signed)
LDL is been relatively well controlled on current dose of statin.  We need to get labs from PCP.  If not checked, we can recheck at follow-up.

## 2021-09-14 NOTE — Assessment & Plan Note (Signed)
Monitor did not show any signs of VT, A. fib or SVT.  Mild ectopic atrial runs.  Doing well on current dose of diltiazem XT 120 mg.  Not requiring additional PRN dosing.  Plan: Continue standing dose of diltiazem 120 mg XT.

## 2021-09-14 NOTE — Assessment & Plan Note (Signed)
He never really had true anginal symptoms despite having two-vessel disease.  When we did his last PCI of the LAD, he had no symptoms.  He had some exertional dyspnea.  Negative Myoview in July 2022.  Normal echocardiogram. Not on beta-blocker because of fatigue.  Converted to diltiazem XT (acceptable with preserved EF)  On maintenance dose Plavix statin.-Okay to hold 5 to 7 days preop for surgical procedures.

## 2021-09-14 NOTE — Assessment & Plan Note (Signed)
Has not had any recurrent symptoms suggest VTE.  If possible that this short episode he had a few months ago may have been related to that, but no recurrent symptoms.  Did not tolerate antiarrhythmics.  Did not tolerate beta-blocker because of bradycardia, and fatigue.  On stable dose of diltiazem 120 mg daily.  Additional doses.  Not require them.

## 2021-09-14 NOTE — Assessment & Plan Note (Signed)
On lifelong maintenance dose Plavix 75 mg.  Last PCI was 2018.   Okay to hold Plavix 5 to 7 days preop for surgeries or procedures.

## 2021-09-15 NOTE — Telephone Encounter (Signed)
   Name: Alexander Armstrong  DOB: 02/06/1941  MRN: [id number]   Primary Cardiologist: [name], MD  Chart reviewed as part of pre-operative protocol coverage. Patient was contacted 09/15/2021 in reference to pre-operative risk assessment for pending surgery as outlined below.  Alexander Armstrong was last seen on 08/26/2021 by Dr. [name].  Since that day, Alexander Armstrong has done well without chest pain or worsening dyspnea.  Therefore, based on ACC/AHA guidelines, the patient would be at acceptable risk for the planned procedure without further cardiovascular testing.   Dr. [name] recommended to hold Plavix for 7 days prior to the back injection and restart as soon as possible afterward at the surgeon's discretion.  Patient says his procedure was moved up to tomorrow instead, I asked him to double check with his surgeon's office to make sure they are aware that he is on Plavix and his surgeon still willing to do the procedure despite that.  Last dose of Plavix was this morning.  The patient was advised that if he develops new symptoms prior to surgery to contact our office to arrange for a follow-up visit, and he verbalized understanding.  I will route this recommendation to the requesting party via Epic fax function and remove from pre-op pool. Please call with questions.  [place], [place] 09/15/2021, 7:01 PM

## 2021-09-17 ENCOUNTER — Other Ambulatory Visit: Payer: Self-pay | Admitting: Orthopaedic Surgery

## 2021-09-17 DIAGNOSIS — M48062 Spinal stenosis, lumbar region with neurogenic claudication: Secondary | ICD-10-CM

## 2021-09-17 DIAGNOSIS — M5416 Radiculopathy, lumbar region: Secondary | ICD-10-CM

## 2021-10-11 ENCOUNTER — Other Ambulatory Visit: Payer: Self-pay

## 2021-10-11 ENCOUNTER — Ambulatory Visit
Admission: RE | Admit: 2021-10-11 | Discharge: 2021-10-11 | Disposition: A | Discharge status: Home / Self Care | Payer: Medicare Other | Source: Ambulatory Visit | Attending: Orthopaedic Surgery | Admitting: Orthopaedic Surgery

## 2021-10-11 DIAGNOSIS — M5416 Radiculopathy, lumbar region: Secondary | ICD-10-CM

## 2021-10-11 DIAGNOSIS — M48062 Spinal stenosis, lumbar region with neurogenic claudication: Secondary | ICD-10-CM

## 2021-10-15 ENCOUNTER — Other Ambulatory Visit: Payer: Self-pay

## 2021-10-15 ENCOUNTER — Emergency Department (HOSPITAL_BASED_OUTPATIENT_CLINIC_OR_DEPARTMENT_OTHER)
Admission: EM | Admit: 2021-10-15 | Discharge: 2021-10-15 | Disposition: A | Discharge status: Home / Self Care | Payer: Medicare Other | Attending: Emergency Medicine | Admitting: Emergency Medicine

## 2021-10-15 ENCOUNTER — Emergency Department (HOSPITAL_BASED_OUTPATIENT_CLINIC_OR_DEPARTMENT_OTHER): Payer: Medicare Other

## 2021-10-15 ENCOUNTER — Encounter (HOSPITAL_BASED_OUTPATIENT_CLINIC_OR_DEPARTMENT_OTHER): Payer: Self-pay | Admitting: *Deleted

## 2021-10-15 DIAGNOSIS — Z91040 Latex allergy status: Secondary | ICD-10-CM | POA: Insufficient documentation

## 2021-10-15 DIAGNOSIS — J069 Acute upper respiratory infection, unspecified: Secondary | ICD-10-CM | POA: Diagnosis not present

## 2021-10-15 DIAGNOSIS — Z7902 Long term (current) use of antithrombotics/antiplatelets: Secondary | ICD-10-CM | POA: Insufficient documentation

## 2021-10-15 DIAGNOSIS — R059 Cough, unspecified: Secondary | ICD-10-CM | POA: Diagnosis present

## 2021-10-15 DIAGNOSIS — Z20822 Contact with and (suspected) exposure to covid-19: Secondary | ICD-10-CM | POA: Insufficient documentation

## 2021-10-15 LAB — CBC WITH DIFFERENTIAL/PLATELET
Abs Immature Granulocytes: 0.05 10*3/uL (ref 0.00–0.07)
Basophils Absolute: 0.1 10*3/uL (ref 0.0–0.1)
Basophils Relative: 1 %
Eosinophils Absolute: 0.4 10*3/uL (ref 0.0–0.5)
Eosinophils Relative: 4 %
HCT: 31.4 % — ABNORMAL LOW (ref 39.0–52.0)
Hemoglobin: 10.8 g/dL — ABNORMAL LOW (ref 13.0–17.0)
Immature Granulocytes: 1 %
Lymphocytes Relative: 18 %
Lymphs Abs: 1.8 10*3/uL (ref 0.7–4.0)
MCH: 32.6 pg (ref 26.0–34.0)
MCHC: 34.4 g/dL (ref 30.0–36.0)
MCV: 94.9 fL (ref 80.0–100.0)
Monocytes Absolute: 1.2 10*3/uL — ABNORMAL HIGH (ref 0.1–1.0)
Monocytes Relative: 12 %
Neutro Abs: 6.4 10*3/uL (ref 1.7–7.7)
Neutrophils Relative %: 64 %
Platelets: 157 10*3/uL (ref 150–400)
RBC: 3.31 MIL/uL — ABNORMAL LOW (ref 4.22–5.81)
RDW: 12.4 % (ref 11.5–15.5)
WBC: 9.9 10*3/uL (ref 4.0–10.5)
nRBC: 0 % (ref 0.0–0.2)

## 2021-10-15 LAB — BASIC METABOLIC PANEL
Anion gap: 8 (ref 5–15)
BUN: 22 mg/dL (ref 8–23)
CO2: 22 mmol/L (ref 22–32)
Calcium: 9 mg/dL (ref 8.9–10.3)
Chloride: 104 mmol/L (ref 98–111)
Creatinine, Ser: 1.13 mg/dL (ref 0.61–1.24)
GFR, Estimated: >60 mL/min (ref >60)
Glucose, Bld: 126 mg/dL — ABNORMAL HIGH (ref 70–99)
Potassium: 3.7 mmol/L (ref 3.5–5.1)
Sodium: 134 mmol/L — ABNORMAL LOW (ref 135–145)

## 2021-10-15 LAB — RESP PANEL BY RT-PCR (FLU A&B, COVID) ARPGX2
Influenza A by PCR: NEGATIVE
Influenza B by PCR: NEGATIVE
SARS Coronavirus 2 by RT PCR: NEGATIVE

## 2021-10-15 NOTE — ED Provider Notes (Signed)
[facility] Provider Note   CSN: [id number] Arrival date & time: 10/15/21  1344     History  Chief Complaint  Patient presents with   Cough    Alexander Armstrong is a 81 y.o. male.  Pt reports he had pneumonia 1 year ago.  Pt is concerned about getting again  The history is provided by the patient. No language interpreter was used.  Cough Cough characteristics:  Non-productive Sputum characteristics:  Nondescript Severity:  Mild Onset quality:  Gradual Duration:  1 week Timing:  Constant Progression:  Worsening Chronicity:  New Smoker: no   Relieved by:  Nothing Worsened by:  Nothing Ineffective treatments:  None tried Associated symptoms: no fever and no shortness of breath       Home Medications Prior to Admission medications   Medication Sig Start Date End Date Taking? Authorizing Provider  acetaminophen (TYLENOL) 500 MG tablet Take 1,000 mg by mouth 3 (three) times daily as needed for moderate pain or headache.    [provider]  Cholecalciferol (VITAMIN D3) 1000 units CAPS Take 1,000 Units by mouth at bedtime.    [provider]  clopidogrel (PLAVIX) 75 MG tablet TAKE 1 TABLET(75 MG) BY MOUTH DAILY 11/02/19   [name], MD  diltiazem (CARDIZEM) 60 MG tablet Take 1 tablet (60 mg total) by mouth 4 (four) times daily. As needed for fast heart rate 01/08/21   [name], MD  diltiazem (DILACOR XR) 120 MG 24 hr capsule Take 1 capsule (120 mg total) by mouth daily. 01/08/21   [name], MD  oxybutynin (DITROPAN) 5 MG tablet Take 5 mg by mouth daily. 08/03/18   [provider]  pantoprazole (PROTONIX) 40 MG tablet Take 1 tablet (40 mg total) by mouth daily. 08/26/21   [name], MD  rosuvastatin (CRESTOR) 20 MG tablet Take 20 mg by mouth daily. 08/20/16   [provider]      Allergies    Silver sulfadiazine, Latex, Adhesive [tape], and Hydrocodone    Review of Systems   Review of  Systems  Constitutional:  Negative for fever.  Respiratory:  Positive for cough. Negative for shortness of breath.   All other systems reviewed and are negative.  Physical Exam Updated Vital Signs BP 116/69    Pulse 64    Temp 98.1 F (36.7 C) (Oral)    Resp 16    Ht 6' (1.829 m)    Wt 88.5 kg    SpO2 98%    BMI 26.45 kg/m  Physical Exam Vitals reviewed.  Constitutional:      Appearance: Normal appearance.  HENT:     Head: Normocephalic and atraumatic.     Right Ear: Tympanic membrane normal.     Left Ear: Tympanic membrane normal.     Nose: Nose normal.     Mouth/Throat:     Mouth: Mucous membranes are moist.  Eyes:     Extraocular Movements: Extraocular movements intact.     Pupils: Pupils are equal, round, and reactive to light.  Cardiovascular:     Rate and Rhythm: Normal rate.     Pulses: Normal pulses.  Pulmonary:     Effort: Pulmonary effort is normal.  Abdominal:     General: Abdomen is flat.     Palpations: Abdomen is soft.  Musculoskeletal:        General: Normal range of motion.     Cervical back: Normal range of motion.  Skin:  General: Skin is warm.  Neurological:     General: No focal deficit present.     Mental Status: He is alert.  Psychiatric:        Mood and Affect: Mood normal.    ED Results / Procedures / Treatments   Labs (all labs ordered are listed, but only abnormal results are displayed) Labs Reviewed  CBC WITH DIFFERENTIAL/PLATELET - Abnormal; Notable for the following components:      Result Value   RBC 3.31 (*)    Hemoglobin 10.8 (*)    HCT 31.4 (*)    Monocytes Absolute 1.2 (*)    All other components within normal limits  BASIC METABOLIC PANEL - Abnormal; Notable for the following components:   Sodium 134 (*)    Glucose, Bld 126 (*)    All other components within normal limits  RESP PANEL BY RT-PCR (FLU A&B, COVID) [id number]    EKG None  Radiology DG Chest 2 View  Result Date: 10/15/2021 CLINICAL DATA:  Cough EXAM: CHEST  - 2 VIEW COMPARISON:  10/07/2020 FINDINGS: Heart size upper limits of normal. Chronic tortuosity of the aorta. Loop recorder in place. Radiographic improvement with near complete resolution of patchy densities in the mid and lower lungs. There may be very minimal residual density at the bases, which could represent scarring or minimal basilar infiltrate. No effusion. No acute bone finding. IMPRESSION: Pulmonary infiltrates seen in January of 2022 have resolved. Minimal patchy density at the bases could be scarring or minimal recurrent disease. Electronically Signed   By: Nelson Chimes M.D.   On: 10/15/2021 15:20    Procedures Procedures    Medications Ordered in ED Medications - No data to display  ED Course/ Medical Decision Making/ A&P                           Medical Decision Making Amount and/or Complexity of Data Reviewed Labs: ordered. Decision-making details documented in ED Course.    Details: covid and influenza are negative Radiology: ordered and independent interpretation performed. Decision-making details documented in ED Course.    Details: chest xray no pneumonia  Risk OTC drugs. Risk Details: Illness sound viral.  I doubt pneumonia  Pt stable for discharge            Final Clinical Impression(s) / ED Diagnoses Final diagnoses:  Viral URI with cough    Rx / DC Orders ED Discharge Orders     None      An After Visit Summary was printed and given to the patient.    Fransico Meadow, Hershal Coria 10/15/21 1853    Drenda Freeze, MD 10/20/21 1714

## 2021-10-15 NOTE — Discharge Instructions (Signed)
Return if any problems.

## 2021-10-15 NOTE — ED Notes (Signed)
Pt transported to xray 

## 2021-10-15 NOTE — ED Triage Notes (Signed)
C/o cough , congestion x 1 week

## 2021-12-29 ENCOUNTER — Ambulatory Visit: Payer: Medicare Other | Admitting: Cardiology

## 2022-02-02 LAB — LIPID PANEL
Cholesterol: 119 (ref 0–200)
HDL: 34 — AB (ref 35–70)
LDL Cholesterol: 66
Triglycerides: 136 (ref 40–160)

## 2022-02-12 ENCOUNTER — Other Ambulatory Visit: Payer: Self-pay | Admitting: Cardiology

## 2022-03-13 ENCOUNTER — Other Ambulatory Visit: Payer: Self-pay | Admitting: Cardiology

## 2022-04-30 LAB — HM DIABETES EYE EXAM

## 2022-06-24 ENCOUNTER — Encounter: Payer: Self-pay | Admitting: Family Medicine

## 2022-06-24 ENCOUNTER — Ambulatory Visit (INDEPENDENT_AMBULATORY_CARE_PROVIDER_SITE_OTHER): Payer: Medicare Other | Admitting: Family Medicine

## 2022-06-24 VITALS — BP 118/65 | HR 63 | Temp 98.2°F | Ht 72.0 in | Wt 202.2 lb

## 2022-06-24 DIAGNOSIS — E8809 Other disorders of plasma-protein metabolism, not elsewhere classified: Secondary | ICD-10-CM | POA: Diagnosis not present

## 2022-06-24 DIAGNOSIS — R351 Nocturia: Secondary | ICD-10-CM

## 2022-06-24 DIAGNOSIS — I2510 Atherosclerotic heart disease of native coronary artery without angina pectoris: Secondary | ICD-10-CM

## 2022-06-24 DIAGNOSIS — I739 Peripheral vascular disease, unspecified: Secondary | ICD-10-CM

## 2022-06-24 DIAGNOSIS — R29898 Other symptoms and signs involving the musculoskeletal system: Secondary | ICD-10-CM | POA: Diagnosis not present

## 2022-06-24 DIAGNOSIS — E119 Type 2 diabetes mellitus without complications: Secondary | ICD-10-CM | POA: Diagnosis not present

## 2022-06-24 DIAGNOSIS — R2689 Other abnormalities of gait and mobility: Secondary | ICD-10-CM | POA: Diagnosis not present

## 2022-06-24 LAB — CBC WITH DIFFERENTIAL/PLATELET
Basophils Absolute: 0.1 10*3/uL (ref 0.0–0.1)
Basophils Relative: 0.8 % (ref 0.0–3.0)
Eosinophils Absolute: 0.3 10*3/uL (ref 0.0–0.7)
Eosinophils Relative: 3.7 % (ref 0.0–5.0)
HCT: 31.8 % — ABNORMAL LOW (ref 39.0–52.0)
Hemoglobin: 10.7 g/dL — ABNORMAL LOW (ref 13.0–17.0)
Lymphocytes Relative: 28.9 % (ref 12.0–46.0)
Lymphs Abs: 2.2 10*3/uL (ref 0.7–4.0)
MCHC: 33.8 g/dL (ref 30.0–36.0)
MCV: 93.6 fl (ref 78.0–100.0)
Monocytes Absolute: 0.8 10*3/uL (ref 0.1–1.0)
Monocytes Relative: 10.1 % (ref 3.0–12.0)
Neutro Abs: 4.3 10*3/uL (ref 1.4–7.7)
Neutrophils Relative %: 56.5 % (ref 43.0–77.0)
Platelets: 145 10*3/uL — ABNORMAL LOW (ref 150.0–400.0)
RBC: 3.39 Mil/uL — ABNORMAL LOW (ref 4.22–5.81)
RDW: 13.7 % (ref 11.5–15.5)
WBC: 7.6 10*3/uL (ref 4.0–10.5)

## 2022-06-24 LAB — URINALYSIS, ROUTINE W REFLEX MICROSCOPIC
Bilirubin Urine: NEGATIVE
Hgb urine dipstick: NEGATIVE
Ketones, ur: NEGATIVE
Leukocytes,Ua: NEGATIVE
Nitrite: NEGATIVE
RBC / HPF: NONE SEEN (ref 0–?)
Specific Gravity, Urine: 1.02 (ref 1.000–1.030)
Total Protein, Urine: NEGATIVE
Urine Glucose: NEGATIVE
Urobilinogen, UA: 0.2 (ref 0.0–1.0)
pH: 6 (ref 5.0–8.0)

## 2022-06-24 LAB — COMPREHENSIVE METABOLIC PANEL
ALT: 12 U/L (ref 0–53)
AST: 17 U/L (ref 0–37)
Albumin: 3.9 g/dL (ref 3.5–5.2)
Alkaline Phosphatase: 72 U/L (ref 39–117)
BUN: 27 mg/dL — ABNORMAL HIGH (ref 6–23)
CO2: 26 mEq/L (ref 19–32)
Calcium: 9.3 mg/dL (ref 8.4–10.5)
Chloride: 104 mEq/L (ref 96–112)
Creatinine, Ser: 1.28 mg/dL (ref 0.40–1.50)
GFR: 52.64 mL/min — ABNORMAL LOW (ref >60.00)
Glucose, Bld: 121 mg/dL — ABNORMAL HIGH (ref 70–99)
Potassium: 4.3 mEq/L (ref 3.5–5.1)
Sodium: 135 mEq/L (ref 135–145)
Total Bilirubin: 0.4 mg/dL (ref 0.2–1.2)
Total Protein: 9.1 g/dL — ABNORMAL HIGH (ref 6.0–8.3)

## 2022-06-24 LAB — HEMOGLOBIN A1C: Hgb A1c MFr Bld: 7.7 % — ABNORMAL HIGH (ref 4.6–6.5)

## 2022-06-24 LAB — PSA: PSA: 0.82 ng/mL (ref 0.10–4.00)

## 2022-06-24 MED ORDER — ROSUVASTATIN CALCIUM 20 MG PO TABS: 20.0000 mg | ORAL_TABLET | Freq: Every day | ORAL | 3 refills | Status: DC

## 2022-06-24 MED ORDER — PANTOPRAZOLE SODIUM 40 MG PO TBEC: 40.0000 mg | DELAYED_RELEASE_TABLET | Freq: Every day | ORAL | 3 refills | Status: DC

## 2022-06-24 NOTE — Progress Notes (Signed)
Phone: [phone]   Subjective:  Patient presents today to establish care.  Prior patient of Dr Karle Starch with [facility].  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 Ant Stemi (11/03/04) mLAD - PCI: Taxus DES 3.0 x 20; b. (11/06/04) staged RCA DES PCI: Taxus DES 3.5 x 16; c. (01/2017) High Risk Myoview (~fixed anterior defect with normal WM) @ [facility] (Dr. [name]) Synergy DES 3.0 x 12 (for 99% lesion @ prox edge of old stent   Cardiac syncope    No episode since starting flecainide. Had documented WIDE COMPLEX Tachycardia on loop recorder.   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 Unable to induce during EP study.;  Due to CAD, flecainide discontinued -> intolerant of both mexiletine and amiodarone -> Dr. Curt Bears (EP-Cards) d/c'd mexilitine & statrted low dose Diltizem XT; Holter Monitor 12/2018 - HR 47-114, rate PACs/PVCs, no Afib/flutter or SVT, VT.    Reflux gastritis    STEMI involving left anterior descending coronary artery ([place]) 11/03/2004   [facility]: Occluded LAD treated with DES. Staged PCI to the RCA.   Patient Active Problem List   Diagnosis Date Noted   Wide-complex tachycardia - WCT ([place]) 12/09/2016    Priority: High   Cardiac syncope 03/10/2016    Priority: High   Cardiac device in situ 11/18/2015    Priority: High   Type 2 diabetes mellitus without complication, without long-term current use of insulin ([place]) 11/18/2015     Priority: High   Paroxysmal VT ([place]) 09/17/2015    Priority: High   History of acute anterior wall MI 01/26/2008    Priority: High   Coronary artery disease involving native coronary artery of native heart without angina pectoris 11/03/2004    Priority: High   Hypotension 02/22/2018    Priority: Medium    BPH associated with nocturia 11/18/2015    Priority: Medium    Gastroesophageal reflux disease without esophagitis 11/18/2015    Priority: Medium    Hyperlipidemia LDL goal <70 01/26/2008    Priority: Medium    ESOPHAGEAL STRICTURE 12/26/2007    Priority: Medium    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    Priority: Medium    Exercise intolerance 02/20/2019    Priority: Low   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   Other acquired hammer toe 05/27/2017    Priority: 1.  Acute right-sided low back pain without sciatica 11/18/2015    Priority: 1.   Ingrown nail 11/18/2015    Priority: 1.   Past Surgical History:  Procedure Laterality Date   CARDIAC CATHETERIZATION  ~2011   [facility]: re-look Cath - patent stents   CORONARY STENT INTERVENTION N/A 01/27/2017   Procedure: Coronary Stent Intervention;  Surgeon: [name], MD;  Location: [facility]: mLAD (just after D1) overlapping prior DES -> SYNERGY DES 3X12 drug eluting stent   ELECTROPHYSIOLOGIC STUDY  2014   Unable to stimulate VT seen on loop recorder   HOLTER MONITOR  12/2018    (Off of mexiletine, only  on diltiazem) heart rate ranged from 47 to 114 bpm.  Rare PACs and PVCs.  Sinus rhythm noted no A. fib or other arrhythmia.   implanted loop heart monitor x2  ~2011; 2014   Per report- batter out of date; apparently captured a prolonged episode of wide complex tachycardia associated with an episode of weakness/near syncope   INTRAVASCULAR PRESSURE WIRE/FFR STUDY N/A 01/27/2017   Procedure: Intravascular Pressure Wire/FFR Study;  Surgeon: [name], MD;  Location: [facility];  Service: Cardiovascular;  Laterality: N/A;   LEFT HEART CATH AND CORONARY ANGIOGRAPHY N/A 01/27/2017   Procedure: Left Heart Cath and Coronary Angiography;  Surgeon: [name], MD;  [facility]: [facility] IN[facility]IVE [facility]: mLAD 99% stenosis pre-Stent & ISR --> PCI. Ost DI ~50%. pRCA DES ~50-60% FFR 0.86.   NM MYOVIEW LTD  07/2014   Unremarkable EKG. No evidence of ischemia or infarct. Mild anterior attenuation, cannot exclude prior infarct, but nonreversible. EF 66%.   NM MYOVIEW LTD  12/22/2016   INTERMEDIATE RISK. EF 58%. Defect 1: Medium size severe defect in the mid anterior, apical anterior and apical wall consistent with prior anterior MI. Defect to: Large defect and moderate severity in the basal inferoseptal basal inferior and inferoseptal mid inferior wall consistent with ischemia.   NM MYOVIEW LTD  04/05/2019   EF 55 to 65%.  Small size, mild severity fixed perfusion defect in the mid-apical anterior septal wall.  Likely represents old anterior infarct.   No evidence of ischemia. LOW RISK.    PERCUTANEOUS CORONARY STENT INTERVENTION (PCI-S)  January-February 2006   a. mLAD - Taxus DES 3.0 x 20; b. (11/06/04) staged PCI to RCA Taxus DES 3.5 x 16; c. 01/27/2017: PCI to mLAD prox to old  stent - Synergy DES 3.0 x 12   TRANSTHORACIC ECHOCARDIOGRAM  10/11/2020    [facility]):  Normal LV size & function. EF 55-60%. NO RWMA. Normal filling parameters.  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  IVC is mildly dilated.  No vegetations noted.  No significant change from last study.   TRANSTHORACIC ECHOCARDIOGRAM  05/2020   [facility]: 05/21/2020: Normal LV size and function.  Normal valves.  No effusion.; 06/04/2020: Normal LV size and function EF 60 to 65%.  Trace MR and TR.  No pulmonary hypertension.  Stable   UMBILICAL HERNIA REPAIR      Family History  Problem Relation Age of Onset   Uterine cancer Mother    Multiple myeloma Mother    Heart disease Mother    Prostate cancer Father    Cancer Father        sarcoma   Macular degeneration Father    Heart attack Brother    Glaucoma Paternal Aunt    Colon cancer  Neg Hx    Esophageal cancer Neg Hx    Rectal cancer Neg Hx    Stomach cancer Neg Hx     Medications- reviewed and updated Current Outpatient Medications  Medication Sig Dispense Refill   acetaminophen (TYLENOL) 500 MG tablet Take 1,000 mg by mouth 3 (three) times daily as needed for moderate pain or headache.     Cholecalciferol (VITAMIN D3) 1000 units CAPS Take 1,000 Units by mouth at bedtime.     clopidogrel (PLAVIX) 75 MG tablet TAKE 1 TABLET(75 MG) BY MOUTH DAILY 90 tablet 2   diltiazem (DILT-XR) 120 MG 24 hr capsule Take 1 capsule (120 mg total) by mouth daily. 90 capsule 1   rosuvastatin (CRESTOR) 20 MG tablet Take 1 tablet (20 mg total) by mouth daily. 90 tablet 3   tamsulosin (FLOMAX) 0.4 MG CAPS capsule Take 0.4 mg by mouth daily. Dr. Yong Channel willing to refill     diltiazem (CARDIZEM) 60 MG tablet Take 1 tablet (60 mg total) by mouth 4 (four) times daily. As needed for fast heart rate 60 tablet 1   pantoprazole (PROTONIX) 40 MG tablet Take 1 tablet (40 mg total) by mouth daily. 90 tablet 3   No current facility-administered medications for this visit.    Allergies-reviewed and updated Allergies  Allergen Reactions   Silver Sulfadiazine Hives and Rash   Latex Other (See Comments)    "takes the skin off"   Adhesive [Tape] Rash    Hydrocodone Rash    Social History   Social History Narrative   Married 41 years in 2023. 1 child Conne in [place].  1 grandchild in [place].       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       Hobbies: work, singing in church, some guitar (owns several guitars)    Objective  Objective:  BP 118/65   Pulse 63   Temp 98.2 F (36.8 C)   Ht 6' (1.829 m)   Wt 202 lb 3.2 oz (91.7 kg)   SpO2 98%   BMI 27.42 kg/m  Gen: NAD, resting comfortably CV: RRR no murmurs rubs or gallops Lungs: CTAB no crackles, wheeze, rhonchi Abdomen: soft/nontender- periumbilical hernia nontender/nondistended/normal bowel sounds. No rebound or guarding.  Ext: no edema Skin: warm, dry Neuro: 5/5 strength in lower extremities Diabetic Foot Exam - Simple   Simple Foot Form Diabetic Foot exam was performed with the following findings: Yes 06/24/2022 10:06 AM  Visual Inspection No deformities, no ulcerations, no other skin breakdown bilaterally: Yes Sensation Testing Intact to touch and monofilament testing bilaterally: Yes Pulse Check Posterior Tibialis and Dorsalis pulse intact bilaterally: Yes Comments But only 1+ PT pulses and 2+ DP pulses       Assessment and Plan:   #CAD-follows with Dr. [name] with history of MI and PCI #hyperlipidemia-LDL goal under 70 #Palpitations-as needed diltiazem 60 mg in addition to 120 mg extended release S: Medication:Plavix 75 mg, rosuvastatin 20 mg - has not needed 60 mg dose -no chest pain or SOB Lab Results  Component Value Date   CHOL 119 02/02/2022   HDL 34 (A) 02/02/2022   LDLCALC 66 02/02/2022   TRIG 136 02/02/2022  A/P: CAD asymptomatic-continue current medications.  Lipids have been at goal when recently checked in May-not due for repeat at this time.  Potation's have been stable-continue close follow-up with cardiology and remain on diltiazem   # Diabetes S: Medication:diet controlled -  with a1c  in may of 6.9 CBGs- does not check sugar Exercise and diet- limited by lightheadedness. Still works 9 hours a day and tired from this but active with work  A/P: hopefully stable- update a1c today. Continue current meds for now  # GERD with history Barrett's esphagus history S:Medication:  pantoprazole 40 mg B12 levels related to PPI use: Low normal in 2013-has had mild chronic anemia and was advised to remain on B12 long-term A/P: Controlled. Continue current medications.   - reports was released from GI- letter 01/25/20 in epic shows release  #Vitamin D deficiency S: Medication: 2000 units daily A/P: recently normal in may of this year- continue vitamin D supplement   #Overactive bladder/BPH S: In May was restarted on tamsulosin (prior lower blood pressures and stopped)-I do not see oxybutynin noted - hourly trip to the bathroom at night. Alternates between fairly loose bowels and constipation- long term issue (colonoscopy 2021). Father had sarcoma between bowels and intestines in his 59s - on oxybutynin 5 mg daily in past- off due to dizziness   A/P: BPH/OAB poorly controlled but mor echronic- he does request PSA today with hourly nocturia- also will check UA  #balance issues/leg weakness- worse since covid in 2021 (severe illness 8 weeks in hospital and then rehab) but has had issues for years. PT was helpful in the past- refer back today to [facility] location  -also reports thigh pain with walking in big stores  #abdominal hernia- periumbical- repaired years ago- wants to hold off on surgery. Noted on exam- will monitor   # Chronic congestion S:long term issues. Antihistamines dry him out but work otherwise but has not tried in years  (benadryl for instance)- sounds like was able to take meds like claritin/allegra- does not recall benefit. Flonase does not help significantly - using now. Antibiotics have not helped in the past. Chronic sinus pressure maxillary- occasional mild vision  issues with it- had eyes checked recently.   A/P: with congestion issues being long term and improving on benadryl- I wonder if he would do better with an option like xyzal, allegra or claritin- may help without drying him out as much- encouraged at least a 2-3 week trial along with continued flonase  -could refer to ENT if not improving   #Weight gain-at may note with prior PCP noted to have gained 30 pounds over a year-TSH was checked and normal  -Nonspecific thrombocytopenia noted but stable at last visit with [facility] -Vitamin D noted at 48 within normal range 30 to 100 ng/mL on 02/02/2022 -A1c of 6.9 on 02/02/2022 with urine microalbumin creatinine ratio not elevated -Thyroid was normal at last visit -LDL well controlled at 66 -CMP largely reassuring other than mildly low sodium at 134 and total protein slightly high at 8.9 with GFR of 57 -B12 at 465 Thrombocytopenia noted but not noted on last labs but did have mild anemia  #Hyperproteinemia- will check PE and FLC and CMP today- noted at last visit with [facility]. Discussed diagnosis like MGUS or multiple myeloma (his mom had that)   Recommended follow up: Return in about 6 months (around 12/23/2022) for followup or sooner if needed.Schedule b4 you leave. Future Appointments  Date Time Provider [place]  08/07/2022  8:00 AM [name], MD [facility] None   Meds ordered this encounter  Medications   pantoprazole (PROTONIX) 40 MG tablet    Sig: Take 1 tablet (40 mg total) by mouth daily.    Dispense:  90 tablet    Refill:  3   rosuvastatin (CRESTOR) 20 MG tablet    Sig: Take 1 tablet (20 mg total) by mouth daily.    Dispense:  90 tablet    Refill:  3   Return precautions advised. Garret Reddish, MD

## 2022-06-24 NOTE — Patient Instructions (Addendum)
Sign release of information at the check out desk for diabetic eye exam.  Let us know when you get your [facility] vaccine at the pharmacy.  with congestion issues being long term and improving on benadryl- I wonder if he would do better with an option like xyzal, allegra or claritin- may help without drying him out as much- encouraged at least a 2-3 week trial along with continued flonase  -could refer to ENT if not improving   We will call you within two weeks about your referral to outpatient PT as well as for blood flow tests for the legs. If you do not hear within 2 weeks, give [place] a call.   Health Maintenance Due  Topic Date Due   HEMOGLOBIN A1C  Never done   FOOT EXAM  Never done   OPHTHALMOLOGY EXAM  Never done   Zoster Vaccines- Shingrix (1 of 2) Never done   TETANUS/TDAP  12/24/2019   INFLUENZA VACCINE  05/05/2022    Recommended follow up: Return in about 6 months (around 12/23/2022) for followup or sooner if needed.Schedule b4 you leave.

## 2022-06-25 ENCOUNTER — Other Ambulatory Visit: Payer: Self-pay

## 2022-06-25 DIAGNOSIS — D649 Anemia, unspecified: Secondary | ICD-10-CM

## 2022-06-25 DIAGNOSIS — E778 Other disorders of glycoprotein metabolism: Secondary | ICD-10-CM

## 2022-06-25 DIAGNOSIS — D696 Thrombocytopenia, unspecified: Secondary | ICD-10-CM

## 2022-06-25 LAB — URINE CULTURE
MICRO NUMBER:: 13944147
Result:: NO GROWTH
SPECIMEN QUALITY:: ADEQUATE

## 2022-06-29 ENCOUNTER — Encounter: Payer: Self-pay | Admitting: *Deleted

## 2022-06-29 LAB — PE AND FLC, SERUM
A/G Ratio: 0.9 (ref 0.7–1.7)
Albumin ELP: 4.1 g/dL (ref 2.9–4.4)
Alpha 1: 0.2 g/dL (ref 0.0–0.4)
Alpha 2: 0.7 g/dL (ref 0.4–1.0)
Beta: 0.9 g/dL (ref 0.7–1.3)
Gamma Globulin: 3 g/dL — ABNORMAL HIGH (ref 0.4–1.8)
Globulin, Total: 4.8 g/dL — ABNORMAL HIGH (ref 2.2–3.9)
Ig Kappa Free Light Chain: 73 mg/L — ABNORMAL HIGH (ref 3.3–19.4)
Ig Lambda Free Light Chain: 7.7 mg/L (ref 5.7–26.3)
KAPPA/LAMBDA RATIO: 9.48 — ABNORMAL HIGH (ref 0.26–1.65)
M-Spike, %: 2.7 g/dL — ABNORMAL HIGH
Total Protein: 8.9 g/dL — ABNORMAL HIGH (ref 6.0–8.5)

## 2022-07-06 ENCOUNTER — Inpatient Hospital Stay: Payer: Medicare Other | Attending: Hematology & Oncology

## 2022-07-06 ENCOUNTER — Encounter: Payer: Self-pay | Admitting: Hematology & Oncology

## 2022-07-06 ENCOUNTER — Other Ambulatory Visit: Payer: Self-pay

## 2022-07-06 ENCOUNTER — Ambulatory Visit (HOSPITAL_BASED_OUTPATIENT_CLINIC_OR_DEPARTMENT_OTHER)
Admission: RE | Admit: 2022-07-06 | Discharge: 2022-07-06 | Disposition: A | Discharge status: Home / Self Care | Payer: Medicare Other | Source: Ambulatory Visit | Attending: Hematology & Oncology | Admitting: Hematology & Oncology

## 2022-07-06 ENCOUNTER — Inpatient Hospital Stay (HOSPITAL_BASED_OUTPATIENT_CLINIC_OR_DEPARTMENT_OTHER): Payer: Medicare Other | Admitting: Hematology & Oncology

## 2022-07-06 VITALS — BP 113/68 | HR 78 | Temp 97.9°F | Resp 16 | Ht 72.0 in | Wt 201.0 lb

## 2022-07-06 DIAGNOSIS — R768 Other specified abnormal immunological findings in serum: Secondary | ICD-10-CM | POA: Diagnosis present

## 2022-07-06 DIAGNOSIS — D472 Monoclonal gammopathy: Secondary | ICD-10-CM | POA: Diagnosis not present

## 2022-07-06 DIAGNOSIS — R799 Abnormal finding of blood chemistry, unspecified: Secondary | ICD-10-CM

## 2022-07-06 DIAGNOSIS — D649 Anemia, unspecified: Secondary | ICD-10-CM | POA: Diagnosis not present

## 2022-07-06 LAB — CBC WITH DIFFERENTIAL (CANCER CENTER ONLY)
Abs Immature Granulocytes: 0.05 10*3/uL (ref 0.00–0.07)
Basophils Absolute: 0.1 10*3/uL (ref 0.0–0.1)
Basophils Relative: 1 %
Eosinophils Absolute: 0.2 10*3/uL (ref 0.0–0.5)
Eosinophils Relative: 3 %
HCT: 31 % — ABNORMAL LOW (ref 39.0–52.0)
Hemoglobin: 10.3 g/dL — ABNORMAL LOW (ref 13.0–17.0)
Immature Granulocytes: 1 %
Lymphocytes Relative: 29 %
Lymphs Abs: 2.3 10*3/uL (ref 0.7–4.0)
MCH: 31.5 pg (ref 26.0–34.0)
MCHC: 33.2 g/dL (ref 30.0–36.0)
MCV: 94.8 fL (ref 80.0–100.0)
Monocytes Absolute: 0.7 10*3/uL (ref 0.1–1.0)
Monocytes Relative: 9 %
Neutro Abs: 4.4 10*3/uL (ref 1.7–7.7)
Neutrophils Relative %: 57 %
Platelet Count: 145 10*3/uL — ABNORMAL LOW (ref 150–400)
RBC: 3.27 MIL/uL — ABNORMAL LOW (ref 4.22–5.81)
RDW: 13 % (ref 11.5–15.5)
WBC Count: 7.7 10*3/uL (ref 4.0–10.5)
nRBC: 0 % (ref 0.0–0.2)

## 2022-07-06 LAB — IRON AND IRON BINDING CAPACITY (CC-WL,HP ONLY)
Iron: 77 ug/dL (ref 45–182)
Saturation Ratios: 30 % (ref 17.9–39.5)
TIBC: 260 ug/dL (ref 250–450)
UIBC: 183 ug/dL (ref 117–376)

## 2022-07-06 LAB — CMP (CANCER CENTER ONLY)
ALT: 11 U/L (ref 0–44)
AST: 16 U/L (ref 15–41)
Albumin: 4.1 g/dL (ref 3.5–5.0)
Alkaline Phosphatase: 62 U/L (ref 38–126)
Anion gap: 6 (ref 5–15)
BUN: 31 mg/dL — ABNORMAL HIGH (ref 8–23)
CO2: 27 mmol/L (ref 22–32)
Calcium: 9.7 mg/dL (ref 8.9–10.3)
Chloride: 100 mmol/L (ref 98–111)
Creatinine: 1.3 mg/dL — ABNORMAL HIGH (ref 0.61–1.24)
GFR, Estimated: 55 mL/min — ABNORMAL LOW (ref >60)
Glucose, Bld: 189 mg/dL — ABNORMAL HIGH (ref 70–99)
Potassium: 3.7 mmol/L (ref 3.5–5.1)
Sodium: 133 mmol/L — ABNORMAL LOW (ref 135–145)
Total Bilirubin: 0.4 mg/dL (ref 0.3–1.2)
Total Protein: 9.9 g/dL — ABNORMAL HIGH (ref 6.5–8.1)

## 2022-07-06 LAB — RETICULOCYTES
Immature Retic Fract: 16.6 % — ABNORMAL HIGH (ref 2.3–15.9)
RBC.: 3.26 MIL/uL — ABNORMAL LOW (ref 4.22–5.81)
Retic Count, Absolute: 46.3 10*3/uL (ref 19.0–186.0)
Retic Ct Pct: 1.4 % (ref 0.4–3.1)

## 2022-07-06 LAB — SAVE SMEAR(SSMR), FOR PROVIDER SLIDE REVIEW

## 2022-07-06 LAB — LACTATE DEHYDROGENASE: LDH: 155 U/L (ref 98–192)

## 2022-07-06 LAB — FERRITIN: Ferritin: 392 ng/mL — ABNORMAL HIGH (ref 24–336)

## 2022-07-06 NOTE — Progress Notes (Signed)
Referral MD  Reason for Referral: Abnormal M spike; anemia  Chief Complaint  Patient presents with   New Patient (Initial Visit)  : I just feel tired.  HPI: Alexander Armstrong is a very nice 81 year old white male.  [place].  He comes in with his wife.  They have been married 28 years.  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  Heavy does have multiple health issues.  He did have a myocardial infarction back about 5 years ago.  He did have some stents placed.  He does have a umbilical hernia.  He recently changed doctors.  Dr. Yong Channel, who is incredibly thorough, noted that there was some anemia.  He also noted there was a slightly elevated protein.  He subsequently did some additional blood studies.  Surprisingly, Alexander Armstrong was found to have a monoclonal spike of 2.7 g/dL.  His lab work was done about a month ago.  He had a CBC done on 06/24/2022.  This showed a hemoglobin of 10.7 with a hematocrit of 31.8.  MCV was 94.  His electrolytes on 06/24/2022 showed a BUN of 27 creatinine 1.28.  His blood sugar was 121.  His LFTs were normal.  He has never smoked.  Has never drank.  As far as I know, there is no occupational exposures.  There is no history in the family of any kind of blood problems.  He has had no weight loss or weight gain.  His appetite is doing okay.  His last colonoscopy was couple years ago.  He has had no melena or bright red blood per rectum.  There is no rashes.  He has had no swollen lymph nodes.  He has had no fever.  He apparently had horrible COVID about 2 years ago.  Is in the hospital for about 9 weeks.  Currently, I would say his performance status is probably ECOG 1.     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 Ant Stemi (11/03/04) mLAD - PCI: Taxus DES 3.0 x 20; b.  (11/06/04) staged RCA DES PCI: Taxus DES 3.5 x 16; c. (01/2017) High Risk Myoview (~fixed anterior defect with normal WM) @ [facility] (Dr. [name]) Synergy DES 3.0 x 12 (for 99% lesion @ prox edge of old stent   Cardiac syncope    No episode since starting flecainide. Had documented WIDE COMPLEX Tachycardia on loop recorder.   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 Unable to induce during EP study.;  Due to CAD, flecainide discontinued -> intolerant of both mexiletine and amiodarone -> Dr. Curt Bears (EP-Cards) d/c'd mexilitine & statrted low dose Diltizem XT; Holter Monitor 12/2018 - HR 47-114, rate PACs/PVCs, no Afib/flutter or SVT, VT.    Reflux gastritis    STEMI involving left anterior descending coronary artery ([place]) 11/03/2004   [facility]: Occluded LAD treated with DES. Staged PCI to the RCA.  :   Past Surgical History:  Procedure Laterality Date   CARDIAC CATHETERIZATION  ~2011   [facility]: re-look Cath - patent stents   CORONARY STENT  INTERVENTION N/A 01/27/2017   Procedure: Coronary Stent Intervention;  Surgeon: [name], MD;  Location: [facility]: mLAD (just after D1) overlapping prior DES -> SYNERGY DES 3X12 drug eluting stent   ELECTROPHYSIOLOGIC STUDY  2014   Unable to stimulate VT seen on loop recorder   HOLTER MONITOR  12/2018    (Off of mexiletine, only on diltiazem) heart rate ranged from 47 to 114 bpm.  Rare PACs and PVCs.  Sinus rhythm noted no A. fib or other arrhythmia.   implanted loop heart monitor x2  ~2011; 2014   Per report- batter out of date; apparently captured a prolonged episode of wide complex tachycardia associated with an episode of weakness/near syncope   INTRAVASCULAR PRESSURE WIRE/FFR STUDY N/A 01/27/2017   Procedure:  Intravascular Pressure Wire/FFR Study;  Surgeon: [name], MD;  Location: [facility];  Service: Cardiovascular;  Laterality: N/A;   LEFT HEART CATH AND CORONARY ANGIOGRAPHY N/A 01/27/2017   Procedure: Left Heart Cath and Coronary Angiography;  Surgeon: [name], MD;  Location: [facility] INVASIVE CV LAB: mLAD 99% stenosis pre-Stent & ISR --> PCI. Ost DI ~50%. pRCA DES ~50-60% FFR 0.86.   NM MYOVIEW LTD  07/2014   Unremarkable EKG. No evidence of ischemia or infarct. Mild anterior attenuation, cannot exclude prior infarct, but nonreversible. EF 66%.   NM MYOVIEW LTD  12/22/2016   INTERMEDIATE RISK. EF 58%. Defect 1: Medium size severe defect in the mid anterior, apical anterior and apical wall consistent with prior anterior MI. Defect to: Large defect and moderate severity in the basal inferoseptal basal inferior and inferoseptal mid inferior wall consistent with ischemia.   NM MYOVIEW LTD  04/05/2019   EF 55 to 65%.  Small size, mild severity fixed perfusion defect in the mid-apical anterior septal wall.  Likely represents old anterior infarct.   No evidence of ischemia. LOW RISK.    PERCUTANEOUS CORONARY STENT INTERVENTION (PCI-S)  January-February 2006   a. mLAD - Taxus DES 3.0 x 20; b. (11/06/04) staged PCI to RCA Taxus DES 3.5 x 16; c. 01/27/2017: PCI to mLAD prox to old  stent - Synergy DES 3.0 x 12   TRANSTHORACIC ECHOCARDIOGRAM  10/11/2020    [facility]):  Normal LV size & function. EF 55-60%. NO RWMA. Normal filling parameters.  Mild TR however estimated moderate pulmonary hypertension with RVSP 52 mmHg.  IVC is mildly dilated.  No vegetations noted.  No significant change from last study.   TRANSTHORACIC ECHOCARDIOGRAM  05/2020   [facility]: 05/21/2020: Normal LV size and function.  Normal valves.  No effusion.; 06/04/2020: Normal LV size and function EF 60 to 65%.  Trace MR and TR.  No pulmonary hypertension.  Stable   UMBILICAL HERNIA REPAIR    :   Current Outpatient  Medications:    acetaminophen (TYLENOL) 500 MG tablet, Take 1,000 mg by mouth 3 (three) times daily as needed for moderate pain or headache., Disp: , Rfl:    Cholecalciferol (VITAMIN D3) 1000 units CAPS, Take 1,000 Units by mouth at bedtime., Disp: , Rfl:    clopidogrel (PLAVIX) 75 MG tablet, TAKE 1 TABLET(75 MG) BY MOUTH DAILY, Disp: 90 tablet, Rfl: 2   diltiazem (CARDIZEM) 60 MG tablet, Take 1 tablet (60 mg total) by mouth 4 (four) times daily. As needed for fast heart rate, Disp: 60 tablet, Rfl: 1   diltiazem (DILT-XR) 120 MG 24 hr capsule, Take 1 capsule (120 mg total) by mouth daily.,  Disp: 90 capsule, Rfl: 1   pantoprazole (PROTONIX) 40 MG tablet, Take 1 tablet (40 mg total) by mouth daily., Disp: 90 tablet, Rfl: 3   rosuvastatin (CRESTOR) 20 MG tablet, Take 1 tablet (20 mg total) by mouth daily., Disp: 90 tablet, Rfl: 3   tamsulosin (FLOMAX) 0.4 MG CAPS capsule, Take 0.4 mg by mouth daily. Dr. Yong Channel willing to refill, Disp: , Rfl: :  :   Allergies  Allergen Reactions   Silver Sulfadiazine Hives and Rash   Latex Other (See Comments)    "takes the skin off"   Adhesive [Tape] Rash   Hydrocodone Rash  :   Family History  Problem Relation Age of Onset   Uterine cancer Mother    Multiple myeloma Mother    Heart disease Mother    Prostate cancer Father    Cancer Father        sarcoma   Macular degeneration Father    Heart attack Brother    Glaucoma Paternal Aunt    Colon cancer Neg Hx    Esophageal cancer Neg Hx    Rectal cancer Neg Hx    Stomach cancer Neg Hx   :   Social History   Socioeconomic History   Marital status: Married    Spouse name: Not on file   Number of children: 1   Years of education: Not on file   Highest education level: Not on file  Occupational History   Occupation: Land: Retired  Tobacco Use   Smoking status: Never   Smokeless tobacco: Never  Vaping Use   Vaping Use: Never used  Substance and Sexual Activity   Alcohol  use: No   Drug use: No   Sexual activity: Not on file  Other Topics Concern   Not on file  Social History Narrative   Married 51 years in 2023. 1 child Conne in [place].  1 grandchild in [place].       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       Hobbies: work, singing in church, some guitar (owns several [employer])   Social Determinants of Radio broadcast assistant Strain: Not on file  Food Insecurity: Not on file  Transportation Needs: Not on file  Physical Activity: Not on file  Stress: Not on file  Social Connections: Not on file  Intimate Partner Violence: Not on file  :  Review of Systems  Constitutional:  Positive for malaise/fatigue.  HENT: Negative.    Eyes: Negative.   Respiratory: Negative.    Cardiovascular: Negative.   Gastrointestinal: Negative.   Genitourinary: Negative.   Musculoskeletal: Negative.   Skin: Negative.   Neurological: Negative.   Endo/Heme/Allergies: Negative.   Psychiatric/Behavioral: Negative.       Exam: Vital signs show temperature of 97.9.  Pulse 78.  Blood pressure 113/68.  Weight is 201 pounds.  _0 @ Physical Exam Vitals reviewed.  HENT:     Head: Normocephalic and atraumatic.  Eyes:     Pupils: Pupils are equal, round, and reactive to light.  Cardiovascular:     Rate and Rhythm: Normal rate and regular rhythm.     Heart sounds: Normal heart sounds.  Pulmonary:     Effort: Pulmonary effort is normal.     Breath sounds: Normal breath sounds.  Abdominal:     General: Bowel sounds are normal.     Palpations: Abdomen is soft.  Musculoskeletal:  General: No tenderness or deformity. Normal range of motion.     Cervical back: Normal range of motion.  Lymphadenopathy:     Cervical: No cervical adenopathy.  Skin:    General: Skin is warm and dry.     Findings: No erythema or rash.  Neurological:     Mental Status: He is alert and oriented to person, place,  and time.  Psychiatric:        Behavior: Behavior normal.        Thought Content: Thought content normal.        Judgment: Judgment normal.     Recent Labs    07/06/22 1021  WBC 7.7  HGB 10.3*  HCT 31.0*  PLT 145*    Recent Labs    07/06/22 1021  NA 133*  K 3.7  CL 100  CO2 27  GLUCOSE 189*  BUN 31*  CREATININE 1.30*  CALCIUM 9.7    Blood smear review: Pending  Pathology: None    Assessment and Plan: Alexander Armstrong is a very nice 81 year old white male.  He is very delightful to talk to.  He comes in with his wife.  He is found, routine examination to have a monoclonal spike.  This was 2.7 g/dL.  Of note, he was found to have elevated kappa light chain about 3 weeks ago of 7.3 mg/dL.  It is certainly possible that he may have myeloma.  It is possible this may be a smoldering myeloma.  Ultimately, he will need to have a bone marrow biopsy done.  We will set him up with a bone survey today.  We will see if there is any lytic lesions in the bones.  He is going to do a 24-hour urine for [place].  This will also give [place] a better idea as to whether or not there is actual myeloma.  We will see what his blood work shows.  In his recent lab work, he did have an elevated kappa light chain.  As far as any recommendations for therapy, this really will be dictated by our radiographic and pathologic evaluation of the bone marrow.  We will likely plan to get him in in about 3-4 weeks.  I will try to get the bone marrow biopsy done in a couple weeks or so.  I had a very delightful talk with him.  He is in great shape.  In fact, he and his wife are going down to [place] this week.  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  I am very impressed with his vigor.  He certainly has kept himself in great shape.

## 2022-07-07 LAB — BETA 2 MICROGLOBULIN, SERUM: Beta-2 Microglobulin: 4.1 mg/L — ABNORMAL HIGH (ref 0.6–2.4)

## 2022-07-07 LAB — KAPPA/LAMBDA LIGHT CHAINS
Kappa free light chain: 69.8 mg/L — ABNORMAL HIGH (ref 3.3–19.4)
Kappa, lambda light chain ratio: 12.25 — ABNORMAL HIGH (ref 0.26–1.65)
Lambda free light chains: 5.7 mg/L (ref 5.7–26.3)

## 2022-07-07 LAB — ERYTHROPOIETIN: Erythropoietin: 10.3 m[IU]/mL (ref 2.6–18.5)

## 2022-07-08 LAB — IGG, IGA, IGM
IgA: 19 mg/dL — ABNORMAL LOW (ref 61–437)
IgG (Immunoglobin G), Serum: 3684 mg/dL — ABNORMAL HIGH (ref 603–1613)
IgM (Immunoglobulin M), Srm: 7 mg/dL — ABNORMAL LOW (ref 15–143)

## 2022-07-13 LAB — PROTEIN ELECTROPHORESIS, SERUM, WITH REFLEX
A/G Ratio: 0.9 (ref 0.7–1.7)
Albumin ELP: 4.2 g/dL (ref 2.9–4.4)
Alpha-1-Globulin: 0.2 g/dL (ref 0.0–0.4)
Alpha-2-Globulin: 0.7 g/dL (ref 0.4–1.0)
Beta Globulin: 0.8 g/dL (ref 0.7–1.3)
Gamma Globulin: 3 g/dL — ABNORMAL HIGH (ref 0.4–1.8)
Globulin, Total: 4.7 g/dL — ABNORMAL HIGH (ref 2.2–3.9)
M-Spike, %: 2.3 g/dL — ABNORMAL HIGH
SPEP Interpretation: 0
Total Protein ELP: 8.9 g/dL — ABNORMAL HIGH (ref 6.0–8.5)

## 2022-07-13 LAB — IMMUNOFIXATION REFLEX, SERUM
IgA: 18 mg/dL — ABNORMAL LOW (ref 61–437)
IgG (Immunoglobin G), Serum: 4191 mg/dL — ABNORMAL HIGH (ref 603–1613)
IgM (Immunoglobulin M), Srm: 7 mg/dL — ABNORMAL LOW (ref 15–143)

## 2022-07-14 ENCOUNTER — Ambulatory Visit (HOSPITAL_COMMUNITY)
Admission: RE | Admit: 2022-07-14 | Discharge: 2022-07-14 | Disposition: A | Discharge status: Home / Self Care | Payer: Medicare Other | Source: Ambulatory Visit | Attending: Family Medicine | Admitting: Family Medicine

## 2022-07-14 DIAGNOSIS — I739 Peripheral vascular disease, unspecified: Secondary | ICD-10-CM | POA: Diagnosis present

## 2022-07-17 ENCOUNTER — Ambulatory Visit: Payer: Medicare Other | Admitting: Physical Therapy

## 2022-07-17 ENCOUNTER — Inpatient Hospital Stay: Payer: Medicare Other

## 2022-07-17 DIAGNOSIS — R768 Other specified abnormal immunological findings in serum: Secondary | ICD-10-CM | POA: Diagnosis not present

## 2022-07-17 DIAGNOSIS — D472 Monoclonal gammopathy: Secondary | ICD-10-CM

## 2022-07-20 LAB — UPEP/UIFE/LIGHT CHAINS/TP, 24-HR UR
% BETA, Urine: 0 %
ALPHA 1 URINE: 0 %
Albumin, U: 100 %
Alpha 2, Urine: 0 %
Free Kappa Lt Chains,Ur: 26.87 mg/L (ref 1.17–86.46)
Free Kappa/Lambda Ratio: 12.86 (ref 1.83–14.26)
Free Lambda Lt Chains,Ur: 2.09 mg/L (ref 0.27–15.21)
GAMMA GLOBULIN URINE: 0 %
Total Protein, Urine-Ur/day: 132 mg/24 hr (ref 30–150)
Total Protein, Urine: 6.3 mg/dL
Total Volume: 2100

## 2022-07-24 ENCOUNTER — Ambulatory Visit: Payer: Medicare Other | Attending: Family Medicine | Admitting: Physical Therapy

## 2022-07-24 ENCOUNTER — Encounter: Payer: Self-pay | Admitting: Physical Therapy

## 2022-07-24 DIAGNOSIS — R278 Other lack of coordination: Secondary | ICD-10-CM | POA: Diagnosis present

## 2022-07-24 DIAGNOSIS — R42 Dizziness and giddiness: Secondary | ICD-10-CM | POA: Insufficient documentation

## 2022-07-24 DIAGNOSIS — R2681 Unsteadiness on feet: Secondary | ICD-10-CM | POA: Insufficient documentation

## 2022-07-24 DIAGNOSIS — R262 Difficulty in walking, not elsewhere classified: Secondary | ICD-10-CM | POA: Insufficient documentation

## 2022-07-24 DIAGNOSIS — R29898 Other symptoms and signs involving the musculoskeletal system: Secondary | ICD-10-CM | POA: Diagnosis not present

## 2022-07-24 DIAGNOSIS — R2689 Other abnormalities of gait and mobility: Secondary | ICD-10-CM | POA: Diagnosis not present

## 2022-07-24 DIAGNOSIS — M6281 Muscle weakness (generalized): Secondary | ICD-10-CM | POA: Insufficient documentation

## 2022-07-24 NOTE — Therapy (Signed)
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 Ant Stemi (11/03/04) mLAD - PCI: Taxus DES 3.0 x 20; b. (11/06/04) staged RCA DES PCI: Taxus DES 3.5 x 16; c. (01/2017) High Risk Myoview (~fixed anterior defect with normal WM) @ [facility] (Dr. [name]) Synergy DES 3.0 x 12 (for 99% lesion @ prox edge of old stent   Cardiac syncope    No episode since starting flecainide. Had documented WIDE COMPLEX Tachycardia on loop recorder.   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 Unable to induce during EP study.;  Due to CAD, flecainide discontinued -> intolerant of both mexiletine and amiodarone -> Dr. Curt Bears (EP-Cards) d/c'd mexilitine & statrted low dose Diltizem XT; Holter Monitor 12/2018 - HR 47-114, rate PACs/PVCs, no Afib/flutter or SVT, VT.    Reflux gastritis    STEMI involving left anterior descending coronary artery ([place]) 11/03/2004   [facility]: Occluded LAD treated with DES. Staged PCI to the RCA.   Past Surgical History:  Procedure Laterality Date   CARDIAC CATHETERIZATION  ~2011   [facility]: re-look Cath - patent stents   CORONARY STENT INTERVENTION N/A  01/27/2017   Procedure: Coronary Stent Intervention;  Surgeon: [name], MD;  Location: [place] [facility]: mLAD (just after D1) overlapping prior DES -> SYNERGY DES 3X12 drug eluting stent   ELECTROPHYSIOLOGIC STUDY  2014   Unable to stimulate VT seen on loop recorder   HOLTER MONITOR  12/2018    (Off of mexiletine, only on diltiazem) heart rate ranged from 47 to 114 bpm.  Rare PACs and PVCs.  Sinus rhythm noted no A. fib or other arrhythmia.   implanted loop heart monitor x2  ~2011; 2014   Per report- batter out of date; apparently captured a prolonged episode of wide complex tachycardia associated with an episode of weakness/near syncope   INTRAVASCULAR PRESSURE WIRE/FFR STUDY N/A 01/27/2017   Procedure: Intravascular Pressure Wire/FFR Study;  Surgeon: [name], MD;  Location: [place] [facility];  Service: Cardiovascular;  Laterality: N/A;   LEFT HEART CATH AND CORONARY ANGIOGRAPHY N/A 01/27/2017   Procedure: Left Heart Cath and Coronary Angiography;  Surgeon: [name], MD;  Location: [facility] INVASIVE CV LAB: mLAD 99% stenosis pre-Stent & ISR --> PCI. Ost DI ~50%. pRCA DES ~50-60% FFR 0.86.   NM MYOVIEW LTD  07/2014   Unremarkable EKG. No evidence of ischemia or infarct. Mild anterior attenuation, cannot exclude prior infarct, but nonreversible. EF 66%.   NM MYOVIEW LTD  12/22/2016   INTERMEDIATE  RISK. EF 58%. Defect 1: Medium size severe defect in the mid anterior, apical anterior and apical wall consistent with prior anterior MI. Defect to: Large defect and moderate severity in the basal inferoseptal basal inferior and inferoseptal mid inferior wall consistent with ischemia.   NM MYOVIEW LTD  04/05/2019   EF 55 to 65%.  Small size, mild severity fixed perfusion defect in the mid-apical anterior septal wall.  Likely represents old anterior infarct.   No evidence of ischemia. LOW RISK.    PERCUTANEOUS CORONARY STENT INTERVENTION (PCI-S)  January-February 2006   a. mLAD - Taxus DES  3.0 x 20; b. (11/06/04) staged PCI to RCA Taxus DES 3.5 x 16; c. 01/27/2017: PCI to mLAD prox to old  stent - Synergy DES 3.0 x 12   TRANSTHORACIC ECHOCARDIOGRAM  10/11/2020    [facility]):  Normal LV size & function. EF 55-60%. NO RWMA. Normal filling parameters.  Mild TR however estimated moderate pulmonary hypertension with RVSP 52 mmHg.  IVC is mildly dilated.  No vegetations noted.  No significant change from last study.   TRANSTHORACIC ECHOCARDIOGRAM  05/2020   [facility]: 05/21/2020: Normal LV size and function.  Normal valves.  No effusion.; 06/04/2020: Normal LV size and function EF 60 to 65%.  Trace MR and TR.  No pulmonary hypertension.  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 10/08/2017   Other acquired hammer toe 05/27/2017   Wide-complex tachycardia - WCT ([place]) 12/09/2016   Urge incontinence 12/09/2016   Cardiac syncope 03/10/2016   Acute right-sided low back pain without sciatica 11/18/2015   BPH associated with nocturia 11/18/2015   Cardiac device in situ 11/18/2015   Gastroesophageal reflux disease without esophagitis 11/18/2015   Ingrown nail 11/18/2015   Palpitations 11/18/2015   PVD (posterior vitreous detachment), both eyes 11/18/2015   Type 2 diabetes mellitus without complication, without long-term current use of insulin ([place]) 33/82/5053   Umbilical hernia 97/67/3419   Paroxysmal VT ([place]) 09/17/2015   Personal history of other malignant neoplasm of skin 07/20/2011   Hyperlipidemia LDL goal <70 01/26/2008   History of acute anterior wall MI 01/26/2008   ESOPHAGEAL STRICTURE 12/26/2007   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   HIATAL HERNIA 12/26/2007   DIVERTICULOSIS, COLON 12/26/2007   Coronary artery disease involving native coronary artery of native  heart without angina pectoris 11/03/2004    PCP: Marin Olp, MD   REFERRING PROVIDER: Marin Olp, MD   REFERRING DIAG: Diagnosis R26.89 (ICD-10-CM) - Balance disorder R29.898 (ICD-10-CM) - Weakness of both lower extremities   THERAPY DIAG:  Difficulty in walking, not elsewhere classified  Muscle weakness (generalized)  Other lack of coordination  Unsteadiness on feet  Dizziness and giddiness  Rationale for Evaluation and Treatment Rehabilitation  ONSET DATE: 06/24/2022   SUBJECTIVE:   SUBJECTIVE STATEMENT: Patient reports he is dizzy. He has had dizziness for 50 years, but it has gotten worse recently. He feels his balance reactions are very slow. No true dizziness but rather unsteadiness. He had a bad case of Covid in 2020, required 12 weeks of hospitalization and rehab. He also notes that he is being assessed for possible multiple myeloma. He has testing scheduled for next week.  PERTINENT HISTORY: #balance issues/leg weakness- worse since covid in 2021 (  severe illness 8 weeks in hospital and then rehab) but has had issues for years. PT was helpful in the past- refer back today to [facility] location  -also reports thigh pain with walking in big stores PAIN:  Are you having pain? Yes: NPRS scale: 8/10 Pain location: low back Pain description: aching Aggravating factors: twisting while lifting Relieving factors: Proper body mechanics  PRECAUTIONS: None  WEIGHT BEARING RESTRICTIONS: No  FALLS:  Has patient fallen in last 6 months? 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   OBJECTIVE:   DIAGNOSTIC FINDINGS: IMPRESSION: 1.  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 2. L1-2 and L3-4 left foraminal impingement, greater at L3-4 where the impingement is from a herniation. 3. 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  MUSCLE LENGTH: Hamstrings: Right 70 deg; Left 70 deg Thomas test: B hip flexors tight  POSTURE: rounded shoulders, forward head, left pelvic obliquity, and patient reports scoliosis, rib hump on L.  PALPATION: TTP on lower back paraspinals.  LOWER EXTREMITY ROM:  B LE WFL, tightness in knee to chest and hip IR/ER.  LOWER EXTREMITY MMT:  MMT Right eval Left eval  Hip flexion 4- 4-  Hip extension    Hip abduction    Hip adduction    Hip internal rotation 3+ 3+  Hip external rotation 3+ 3+  Knee flexion 4- 4-  Knee extension 4- 4-  Ankle dorsiflexion 4- 4-  Ankle plantarflexion    Ankle inversion    Ankle eversion     (Blank rows = not tested)  LOWER EXTREMITY SPECIAL TESTS:  Slump test- neg  FUNCTIONAL TESTS:  5 times sit to stand: 12.06 Timed up and go (TUG): N/T Berg Balance Scale: 47 MCTSIB: Condition 1: Avg of 3 trials: 30 sec, Condition 2: Avg of 3 trials: 30 sec, Condition 3: Avg of 3 trials: 30 sec, Condition 4: Avg of 3 trials: 28 sec, and Total Score: 118/120 Patient with increased unsteadiness with eyes closed and on soft surface.  GAIT: Distance walked: 80 Assistive device utilized: None Level of assistance: Complete Independence Comments: WFL-reports limited in distance due to back pain.   TODAY'S TREATMENT                                                                                                                            DATE: Education  PATIENT EDUCATION:  Education details: POC Person educated: Patient Education method: Holiday representative Education comprehension: verbalized  understanding  HOME EXERCISE PROGRAM: TBD  ASSESSMENT:  CLINICAL IMPRESSION: Patient is a 81 y.o. who was seen today for physical therapy evaluation and treatment for poor balance. He demonstrates LE and trunk weakness, decreased balance, reports LBP with standing or ambulation. He will benefit from PT to increase his LE and trunk strength, facilitate speed of movement and balance reactions to decrease fall risk.  OBJECTIVE IMPAIRMENTS: decreased balance, decreased coordination, decreased endurance, difficulty walking, decreased ROM, decreased strength, impaired flexibility, improper body mechanics, postural dysfunction, and pain.   ACTIVITY LIMITATIONS: carrying, lifting, bending, squatting, stairs, and locomotion level  PARTICIPATION LIMITATIONS: cleaning and community activity  PERSONAL FACTORS: Age are also affecting patient's functional outcome.   REHAB POTENTIAL: Good  CLINICAL DECISION MAKING: Stable/uncomplicated  EVALUATION COMPLEXITY: Moderate   GOALS: Goals reviewed with patient? Yes  SHORT TERM GOALS: Target date: 08/21/2022  I with basic HEP Baseline: Goal status: INITIAL  LONG TERM GOALS: Target date: 10/02/2022   I with final HEP Baseline:  Goal status: INITIAL  2.  Increase BERG score to at least 50 Baseline:  Goal status: INITIAL  3.  Increase BLE strength to at least 4/5 throughout Baseline:  Goal status: INITIAL  4.  Patient will report no falls/near falls x 4 weeks in a row. Baseline: Multiple episodes of unsteadiness. Goal status: INITIAL  5.  Patient will ambulate at least 400' with no LOB, no C/O pain Baseline:  Goal status: INITIAL  PLAN:  PT FREQUENCY: 2x/week  PT DURATION: 10 weeks  PLANNED INTERVENTIONS: Therapeutic exercises, Therapeutic activity, Neuromuscular re-education, Balance training, Gait training, Patient/Family education, Self Care, Joint mobilization, Stair training,  Dry Needling, Cryotherapy, Moist heat, Traction, Ionotophoresis 79m/ml Dexamethasone, and Manual therapy  PLAN FOR NEXT SESSION: Est HEP   SMarcelina Morel DPT 07/24/2022, 11:46 AM

## 2022-07-31 ENCOUNTER — Ambulatory Visit: Payer: Medicare Other | Admitting: Physical Therapy

## 2022-07-31 DIAGNOSIS — R262 Difficulty in walking, not elsewhere classified: Secondary | ICD-10-CM

## 2022-07-31 DIAGNOSIS — M6281 Muscle weakness (generalized): Secondary | ICD-10-CM

## 2022-07-31 DIAGNOSIS — R2681 Unsteadiness on feet: Secondary | ICD-10-CM

## 2022-07-31 NOTE — Therapy (Signed)
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 Ant Stemi (11/03/04) mLAD - PCI: Taxus DES 3.0 x 20; b. (11/06/04) staged RCA DES PCI: Taxus DES 3.5 x 16; c. (01/2017) High Risk Myoview (~fixed anterior defect with normal WM) @ [facility] (Dr. [name]) Synergy DES 3.0 x 12 (for 99% lesion @ prox edge of old stent   Cardiac syncope    No episode since starting flecainide. Had documented WIDE COMPLEX Tachycardia on loop recorder.   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 Unable to induce during EP study.;  Due to CAD, flecainide discontinued -> intolerant of both mexiletine and amiodarone -> Dr. Curt Bears (EP-Cards) d/c'd mexilitine & statrted low dose Diltizem XT; Holter Monitor 12/2018 - HR 47-114, rate PACs/PVCs, no Afib/flutter or SVT, VT.    Reflux gastritis    STEMI involving left anterior descending coronary artery ([place]) 11/03/2004   [facility]: Occluded LAD treated with DES. Staged PCI to the RCA.   Past Surgical History:  Procedure Laterality Date   CARDIAC CATHETERIZATION  ~2011   [facility]: re-look Cath - patent stents   CORONARY STENT INTERVENTION N/A 01/27/2017   Procedure: Coronary Stent Intervention;  Surgeon: [name], MD;  Location: [place] [facility]: mLAD (just after  D1) overlapping prior DES -> SYNERGY DES 3X12 drug eluting stent   ELECTROPHYSIOLOGIC STUDY  2014   Unable to stimulate VT seen on loop recorder   HOLTER MONITOR  12/2018    (Off of mexiletine, only on diltiazem) heart rate ranged from 47 to 114 bpm.  Rare PACs and PVCs.  Sinus rhythm noted no A. fib or other arrhythmia.   implanted loop heart monitor x2  ~2011; 2014   Per report- batter out of date; apparently captured a prolonged episode of wide complex tachycardia associated with an episode of weakness/near syncope   INTRAVASCULAR PRESSURE WIRE/FFR STUDY N/A 01/27/2017   Procedure: Intravascular Pressure Wire/FFR Study;  Surgeon: [name], MD;  Location: [place] [facility];  Service: Cardiovascular;  Laterality: N/A;   LEFT HEART CATH AND CORONARY ANGIOGRAPHY N/A 01/27/2017   Procedure: Left Heart Cath and Coronary Angiography;  Surgeon: [name], MD;  Location: [facility] INVASIVE CV LAB: mLAD 99% stenosis pre-Stent & ISR --> PCI. Ost DI ~50%. pRCA DES ~50-60% FFR 0.86.   NM MYOVIEW LTD  07/2014   Unremarkable EKG. No evidence of ischemia or infarct. Mild anterior attenuation, cannot exclude prior infarct, but nonreversible. EF 66%.   NM MYOVIEW LTD  12/22/2016   INTERMEDIATE RISK. EF 58%. Defect 1: Medium size severe defect in the mid anterior, apical anterior and apical wall consistent with  prior anterior MI. Defect to: Large defect and moderate severity in the basal inferoseptal basal inferior and inferoseptal mid inferior wall consistent with ischemia.   NM MYOVIEW LTD  04/05/2019   EF 55 to 65%.  Small size, mild severity fixed perfusion defect in the mid-apical anterior septal wall.  Likely represents old anterior infarct.   No evidence of ischemia. LOW RISK.    PERCUTANEOUS CORONARY STENT INTERVENTION (PCI-S)  January-February 2006   a. mLAD - Taxus DES 3.0 x 20; b. (11/06/04) staged PCI to RCA Taxus DES 3.5 x 16; c. 01/27/2017: PCI to mLAD prox to old  stent - Synergy DES 3.0 x 12    TRANSTHORACIC ECHOCARDIOGRAM  10/11/2020    [facility]):  Normal LV size & function. EF 55-60%. NO RWMA. Normal filling parameters.  Mild TR however estimated moderate pulmonary hypertension with RVSP 52 mmHg.  IVC is mildly dilated.  No vegetations noted.  No significant change from last study.   TRANSTHORACIC ECHOCARDIOGRAM  05/2020   [facility]: 05/21/2020: Normal LV size and function.  Normal valves.  No effusion.; 06/04/2020: Normal LV size and function EF 60 to 65%.  Trace MR and TR.  No pulmonary hypertension.  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 10/08/2017   Other acquired hammer toe 05/27/2017   Wide-complex tachycardia - WCT ([place]) 12/09/2016   Urge incontinence 12/09/2016   Cardiac syncope 03/10/2016   Acute right-sided low back pain without sciatica 11/18/2015   BPH associated with nocturia 11/18/2015   Cardiac device in situ 11/18/2015   Gastroesophageal reflux disease without esophagitis 11/18/2015   Ingrown nail 11/18/2015   Palpitations 11/18/2015   PVD (posterior vitreous detachment), both eyes 11/18/2015   Type 2 diabetes mellitus without complication, without long-term current use of insulin ([place]) 22/29/7989   Umbilical hernia 21/19/4174   Paroxysmal VT ([place]) 09/17/2015   Personal history of other malignant neoplasm of skin 07/20/2011   Hyperlipidemia LDL goal <70 01/26/2008   History of acute anterior wall MI 01/26/2008   ESOPHAGEAL STRICTURE 12/26/2007   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   HIATAL HERNIA 12/26/2007   DIVERTICULOSIS, COLON 12/26/2007   Coronary artery disease involving native coronary artery of native heart without angina pectoris 11/03/2004    PCP: Marin Olp, MD   REFERRING PROVIDER: Marin Olp, MD    REFERRING DIAG: Diagnosis R26.89 (ICD-10-CM) - Balance disorder R29.898 (ICD-10-CM) - Weakness of both lower extremities   THERAPY DIAG:  Difficulty in walking, not elsewhere classified  Muscle weakness (generalized)  Unsteadiness on feet  Rationale for Evaluation and Treatment Rehabilitation  ONSET DATE: 06/24/2022   SUBJECTIVE:   SUBJECTIVE STATEMENT: I have a disc problem , when it flares up I can usually get it to go away in a couple days but its been a week and a half now. Biggest issue is leg weakness and balance PERTINENT HISTORY: #balance issues/leg weakness- worse since covid in 2021 (severe illness 8 weeks in hospital and then rehab) but has had issues for years. PT was helpful in the past- refer back today to [facility] location  -also reports thigh pain with walking in big stores PAIN:  Are you having pain? Yes: NPRS scale: 6/10 Pain location: low back Pain description: aching Aggravating factors: twisting while  lifting Relieving factors: Proper body mechanics  PRECAUTIONS: None  WEIGHT BEARING RESTRICTIONS: No  FALLS:  Has patient fallen in last 6 months? 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   OBJECTIVE:   DIAGNOSTIC FINDINGS: IMPRESSION: 1. 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 2. L1-2 and L3-4 left foraminal impingement, greater at L3-4 where the impingement is from a herniation. 3. 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  MUSCLE LENGTH: Hamstrings: Right 70 deg; Left 70 deg Thomas test: B hip flexors tight  POSTURE: rounded shoulders, forward head, left pelvic obliquity, and patient reports scoliosis, rib hump on L.  PALPATION: TTP on lower back paraspinals.  LOWER EXTREMITY ROM:  B LE WFL, tightness in knee to chest and hip IR/ER.  LOWER EXTREMITY MMT:  MMT Right eval Left eval  Hip flexion 4- 4-  Hip extension    Hip abduction    Hip adduction    Hip internal rotation 3+ 3+  Hip external rotation 3+ 3+  Knee flexion 4- 4-  Knee extension 4- 4-  Ankle dorsiflexion 4- 4-  Ankle plantarflexion    Ankle inversion    Ankle eversion     (Blank rows = not tested)  LOWER EXTREMITY SPECIAL TESTS:  Slump test- neg  FUNCTIONAL TESTS:  5 times sit to stand: 12.06 Timed up and go (TUG): N/T Berg Balance Scale: 47 MCTSIB: Condition 1: Avg of 3 trials: 30 sec, Condition 2: Avg of 3 trials: 30 sec, Condition 3: Avg of 3 trials: 30 sec, Condition 4: Avg of 3 trials: 28 sec, and Total Score: 118/120 Patient with increased unsteadiness with eyes closed and on soft surface.  GAIT: Distance walked: 80 Assistive device utilized: None Level of assistance: Complete Independence Comments: WFL-reports limited in distance due to back pain.   TODAY'S TREATMENT                                                                                                                            DATE: 07/31/22 Nustep L 5 49mn Knee ext 2 sets 10  10# HS curl 2 sets 10  20# Resisted gait 4 ways 4 x each 20# STS on airex 10x- slightly elevated mat Foam beam side step and tandem in //bars 4 x each Red tband hip 3 way in // bars 15 x each  Education HEP for strength and balance. Hip 3 way red tband. Balance at counter marching fwd and back,side stepping, tandem and SLS  PATIENT EDUCATION:  Education details: HEP Person educated: Patient Education method: Research officer, political party Education comprehension: verbalized understanding  HOME EXERCISE PROGRAM: See above  ASSESSMENT:  CLINICAL IMPRESSION: Progressed strengthening with cuing for speed and control. Performed and issued HEP  OBJECTIVE IMPAIRMENTS: decreased balance, decreased coordination, decreased endurance, difficulty walking, decreased ROM, decreased strength, impaired flexibility, improper body mechanics, postural dysfunction, and pain.   ACTIVITY LIMITATIONS: carrying, lifting, bending, squatting, stairs, and locomotion level  PARTICIPATION LIMITATIONS: cleaning and community activity  PERSONAL FACTORS: Age are also affecting patient's functional outcome.   REHAB POTENTIAL: Good  CLINICAL DECISION MAKING: Stable/uncomplicated  EVALUATION COMPLEXITY: Moderate   GOALS: Goals reviewed with patient? Yes  SHORT TERM GOALS: Target date: 08/21/2022  I with basic HEP Baseline: Goal status: 07/31/22 MET  LONG TERM GOALS: Target date: 10/02/2022   I with final HEP Baseline:  Goal status: INITIAL  2.  Increase BERG score to at least 50 Baseline:  Goal status: INITIAL  3.  Increase BLE strength to at least 4/5 throughout Baseline:  Goal status: INITIAL  4.  Patient will report no falls/near falls x 4 weeks in a row. Baseline: Multiple episodes of unsteadiness. Goal status: INITIAL  5.  Patient will ambulate at least 400' with no LOB, no C/O pain Baseline:  Goal status: INITIAL  PLAN:  PT FREQUENCY: 2x/week  PT DURATION: 10 weeks  PLANNED INTERVENTIONS: Therapeutic exercises, Therapeutic activity, Neuromuscular re-education, Balance training, Gait training, Patient/Family education, Self Care, Joint mobilization, Stair training, Dry Needling, Cryotherapy, Moist heat, Traction, Ionotophoresis 70m/ml Dexamethasone, and Manual therapy  PLAN FOR NEXT SESSION: check HEP compliance and progress   AFranki MontePTA 07/31/2022, 9:17 AM [place] [address]Phone: [phone]  Fax:  [phone] Patient Details  Name: CAbdulhamid OlginMRN: [id number] of Birth: 8February 08, 1942Referring Provider:  HMarin Olp MD  Encounter Date: 07/31/2022   PLaqueta Carina PTA 07/31/2022, 9:17 AM  [place] [id number]: [phone]  Fax:  [phone]

## 2022-08-03 ENCOUNTER — Other Ambulatory Visit: Payer: Self-pay | Admitting: Student

## 2022-08-03 DIAGNOSIS — D472 Monoclonal gammopathy: Secondary | ICD-10-CM

## 2022-08-03 NOTE — H&P (Signed)
Chief Complaint: Patient was seen in consultation today for bone marrow biopsy with aspiration   Referring Physician(s): Ennever,Peter R  Supervising Physician: {Supervising Physician:[id number]}  Patient Status: [facility] - Out-pt  History of Present Illness: Nivin Braniff is a 81 y.o. male with a medical history significant for CAD, STEMI and non-sustained paroxysmal V-Tach. Recent lab work showed some anemia and slightly elevated protein levels. Additional blood studies showed a monoclonal spike. There is concern for possible multiple myeloma.   Interventional Radiology has been asked to evaluate this patient for an image-guided bone marrow biopsy with aspiration for further work up.   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 Ant Stemi (11/03/04) mLAD - PCI: Taxus DES 3.0 x 20; b. (11/06/04) staged RCA DES PCI: Taxus DES 3.5 x 16; c. (01/2017) High Risk Myoview (~fixed anterior defect with normal WM) @ [facility] (Dr. [name]) Synergy DES 3.0 x 12 (for 99% lesion @ prox edge of old stent   Cardiac syncope    No episode since starting flecainide. Had documented WIDE COMPLEX Tachycardia on loop recorder.   Cataract bilateral cataracts   Diverticulosis    Esophageal stricture    GERD (gastroesophageal reflux disease)    Hiatal hernia 11/05/2010   HLD (hyperlipidemia)    Nonsustained paroxysmal ventricular tachycardia ([place]) 2014   Initially controlled with flecainide. Unable to induce during EP study.;  Due to CAD, flecainide discontinued -> intolerant of both mexiletine and amiodarone -> Dr. Curt Bears (EP-Cards) d/c'd mexilitine & statrted low dose Diltizem XT; Holter Monitor 12/2018 - HR 47-114, rate PACs/PVCs, no Afib/flutter or SVT, VT.    Reflux gastritis    STEMI involving left anterior descending coronary artery ([place]) 11/03/2004   [facility]:  Occluded LAD treated with DES. Staged PCI to the RCA.    Past Surgical History:  Procedure Laterality Date   CARDIAC CATHETERIZATION  ~2011   [facility]: re-look Cath - patent stents   CORONARY STENT INTERVENTION N/A 01/27/2017   Procedure: Coronary Stent Intervention;  Surgeon: [name], MD;  Location: [place] [facility]: mLAD (just after D1) overlapping prior DES -> SYNERGY DES 3X12 drug eluting stent   ELECTROPHYSIOLOGIC STUDY  2014   Unable to stimulate VT seen on loop recorder   HOLTER MONITOR  12/2018    (Off of mexiletine, only on diltiazem) heart rate ranged from 47 to 114 bpm.  Rare PACs and PVCs.  Sinus rhythm noted no A. fib or other arrhythmia.   implanted loop heart monitor x2  ~2011; 2014   Per report- batter out of date; apparently captured a prolonged episode of wide complex tachycardia associated with an episode of weakness/near syncope   INTRAVASCULAR PRESSURE WIRE/FFR STUDY N/A 01/27/2017   Procedure: Intravascular Pressure Wire/FFR Study;  Surgeon: [name], MD;  Location: [place] [facility];  Service: Cardiovascular;  Laterality: N/A;   LEFT HEART CATH AND CORONARY ANGIOGRAPHY N/A 01/27/2017   Procedure: Left Heart Cath and Coronary Angiography;  Surgeon: [name], MD;  Location: [facility] INV[facility]IVE [facility] LAB: mLAD 99% stenosis pre-Stent & ISR --> PCI. Ost DI ~50%. pRCA DES ~50-60% FFR 0.86.   NM MYOVIEW LTD  07/2014   Unremarkable EKG. No evidence of ischemia or infarct. Mild anterior attenuation, cannot exclude prior infarct, but nonreversible. EF 66%.  NM MYOVIEW LTD  12/22/2016   INTERMEDIATE RISK. EF 58%. Defect 1: Medium size severe defect in the mid anterior, apical anterior and apical wall consistent with prior anterior MI. Defect to: Large defect and moderate severity in the basal inferoseptal basal inferior and inferoseptal mid inferior wall consistent with ischemia.   NM MYOVIEW LTD  04/05/2019   EF 55 to 65%.  Small size, mild severity fixed perfusion  defect in the mid-apical anterior septal wall.  Likely represents old anterior infarct.   No evidence of ischemia. LOW RISK.    PERCUTANEOUS CORONARY STENT INTERVENTION (PCI-S)  January-February 2006   a. mLAD - Taxus DES 3.0 x 20; b. (11/06/04) staged PCI to RCA Taxus DES 3.5 x 16; c. 01/27/2017: PCI to mLAD prox to old  stent - Synergy DES 3.0 x 12   TRANSTHORACIC ECHOCARDIOGRAM  10/11/2020    [facility]):  Normal LV size & function. EF 55-60%. NO RWMA. Normal filling parameters.  Mild TR however estimated moderate pulmonary hypertension with RVSP 52 mmHg.  IVC is mildly dilated.  No vegetations noted.  No significant change from last study.   TRANSTHORACIC ECHOCARDIOGRAM  05/2020   [facility]: 05/21/2020: Normal LV size and function.  Normal valves.  No effusion.; 06/04/2020: Normal LV size and function EF 60 to 65%.  Trace MR and TR.  No pulmonary hypertension.  Stable   UMBILICAL HERNIA REPAIR      Allergies: Silver sulfadiazine, Latex, Adhesive [tape], and Hydrocodone  Medications: Prior to Admission medications   Medication Sig Start Date End Date Taking? Authorizing Provider  acetaminophen (TYLENOL) 500 MG tablet Take 1,000 mg by mouth 3 (three) times daily as needed for moderate pain or headache.    [provider]  Cholecalciferol (VITAMIN D3) 1000 units CAPS Take 1,000 Units by mouth at bedtime.    [provider]  clopidogrel (PLAVIX) 75 MG tablet TAKE 1 TABLET(75 MG) BY MOUTH DAILY 11/02/19   [name], MD  diltiazem (CARDIZEM) 60 MG tablet Take 1 tablet (60 mg total) by mouth 4 (four) times daily. As needed for fast heart rate 01/08/21   [name], MD  diltiazem (DILT-XR) 120 MG 24 hr capsule Take 1 capsule (120 mg total) by mouth daily. 03/13/22   [name], MD  pantoprazole (PROTONIX) 40 MG tablet Take 1 tablet (40 mg total) by mouth daily. 06/24/22   Marin Olp, MD  rosuvastatin (CRESTOR) 20 MG tablet Take 1 tablet (20 mg total) by  mouth daily. 06/24/22   Marin Olp, MD  tamsulosin (FLOMAX) 0.4 MG CAPS capsule Take 0.4 mg by mouth daily. Dr. Yong Channel willing to refill    [provider]     Family History  Problem Relation Age of Onset   Uterine cancer Mother    Multiple myeloma Mother    Heart disease Mother    Prostate cancer Father    Cancer Father        sarcoma   Macular degeneration Father    Heart attack Brother    Glaucoma Paternal Aunt    Colon cancer Neg Hx    Esophageal cancer Neg Hx    Rectal cancer Neg Hx    Stomach cancer Neg Hx     Social History   Socioeconomic History   Marital status: Married    Spouse name: Not on file   Number of children: 1   Years of education: Not on file   Highest education level:  Not on file  Occupational History   Occupation: Land: Retired  Tobacco Use   Smoking status: Never   Smokeless tobacco: Never  Vaping Use   Vaping Use: Never used  Substance and Sexual Activity   Alcohol use: No   Drug use: No   Sexual activity: Not on file  Other Topics Concern   Not on file  Social History Narrative   Married 11 years in 2023. 1 child Conne in [place].  1 grandchild in [place].       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       Hobbies: work, singing in church, some guitar (owns several [employer])   Social Determinants of Radio broadcast assistant Strain: Not on file  Food Insecurity: Not on file  Transportation Needs: Not on file  Physical Activity: Not on file  Stress: Not on file  Social Connections: Not on file    Review of Systems: A 12 point ROS discussed and pertinent positives are indicated in the HPI above.  All other systems are negative.  Review of Systems  Vital Signs: There were no vitals taken for this visit.  Physical Exam  Imaging: VAS [place] ABI WITH/WO TBI  Result Date: 07/14/2022  LOWER EXTREMITY DOPPLER STUDY Patient Name:  HUAN POLLOK   Date of Exam:   07/14/2022 Medical Rec #: [id number]      Accession #:    [id number] Date of Birth: 20-Oct-1940      Patient Gender: M Patient Age:   8 years Exam Location:  [facility] Procedure:      VAS [place] ABI WITH/WO TBI Referring Phys: Garret Reddish --------------------------------------------------------------------------------  Indications: Decreased pulses High Risk Factors: Hyperlipidemia, Diabetes, no history of smoking. Other Factors: Weakness of legs when walking or standing.  Comparison Study: No prior exam. Performing Technologist: Alvia Grove RVT  Examination Guidelines: A complete evaluation includes at minimum, Doppler waveform signals and systolic blood pressure reading at the level of bilateral brachial, anterior tibial, and posterior tibial arteries, when vessel segments are accessible. Bilateral testing is considered an integral part of a complete examination. Photoelectric Plethysmograph (PPG) waveforms and toe systolic pressure readings are included as required and additional duplex testing as needed. Limited examinations for reoccurring indications may be performed as noted.  ABI Findings: +---------+------------------+-----+---------+--------+ Right    Rt Pressure (mmHg)IndexWaveform Comment  +---------+------------------+-----+---------+--------+ Brachial 122                                      +---------+------------------+-----+---------+--------+ PTA      155               1.22 triphasic         +---------+------------------+-----+---------+--------+ DP       151               1.19 triphasic         +---------+------------------+-----+---------+--------+ Great Toe95                0.75 Normal            +---------+------------------+-----+---------+--------+ +---------+------------------+-----+---------+-------+ Left     Lt Pressure (mmHg)IndexWaveform Comment +---------+------------------+-----+---------+-------+ Brachial 127                                      +---------+------------------+-----+---------+-------+  PTA      162               1.28 triphasic        +---------+------------------+-----+---------+-------+ DP       152               1.20 triphasic        +---------+------------------+-----+---------+-------+ Great Toe110               0.87 Normal           +---------+------------------+-----+---------+-------+ +-------+-----------+-----------+------------+------------+ ABI/TBIToday's ABIToday's TBIPrevious ABIPrevious TBI +-------+-----------+-----------+------------+------------+ Right  1.22       0.75                                +-------+-----------+-----------+------------+------------+ Left   1.28       0.87                                +-------+-----------+-----------+------------+------------+   Summary: Right: Resting right ankle-brachial index is within normal range. The right toe-brachial index is normal. Left: Resting left ankle-brachial index is within normal range. The left toe-brachial index is normal. *See table(s) above for measurements and observations.  Electronically signed by Monica Martinez MD on 07/14/2022 at 11:12:00 AM.    Final    DG Bone Survey Met  Result Date: 07/08/2022 CLINICAL DATA:  MGUS EXAM: METASTATIC BONE SURVEY COMPARISON:  None Available. FINDINGS: Metastatic bone survey was performed. Chest shows no focal infiltrate. Ribcage shows no lytic or sclerotic lesions. Lateral view of the skull shows no definitive lytic lesions. Spine shows degenerative change in the cervical and thoracic spine. As well as the lumbar spine with mild scoliosis concave to the left centered at L3. No lytic or sclerotic lesions are seen. The upper extremities show no lytic or sclerotic lesions. Retained fecal material is noted consistent with a degree of colonic constipation Lower extremities show no lytic lesions. IMPRESSION: No evidence of lytic or sclerotic lesions.  Electronically Signed   By: Inez Catalina M.D.   On: 07/08/2022 00:13    Labs:  CBC: Recent Labs    10/15/21 1519 06/24/22 1037 07/06/22 1021  WBC 9.9 7.6 7.7  HGB 10.8* 10.7* 10.3*  HCT 31.4* 31.8* 31.0*  PLT 157 145.0* 145*    COAGS: No results for input(s): "INR", "APTT" in the last 8760 hours.  BMP: Recent Labs    10/15/21 1519 06/24/22 1037 07/06/22 1021  NA 134* 135 133*  K 3.7 4.3 3.7  CL 104 104 100  CO2 _0 GLUCOSE 126* 121* 189*  BUN 22 27* 31*  CALCIUM 9.0 9.3 9.7  CREATININE 1.13 1.28 1.30*  GFRNONAA >60  --  55*    LIVER FUNCTION TESTS: Recent Labs    06/24/22 1037 07/06/22 1021  BILITOT 0.4 0.4  AST 17 16  ALT 12 11  ALKPHOS 72 62  PROT 8.9*  9.1* 9.9*  ALBUMIN 3.9 4.1    TUMOR MARKERS: No results for input(s): "AFPTM", "CEA", "CA199", "CHROMGRNA" in the last 8760 hours.  Assessment and Plan:  Monoclonal spike; concern for multiple myeloma: [name], 81 year old male, presents today to the [facility] department for an image-guided bone marrow biopsy with aspiration.   Risks and benefits of this procedure were discussed with the patient and/or patient's family including, but not limited to bleeding, infection,  damage to adjacent structures or low yield requiring additional tests.  All of the questions were answered and there is agreement to proceed. He has been NPO.   Consent signed and in chart.  Thank you for this interesting consult.  I greatly enjoyed meeting Jaymason Ledesma and look forward to participating in their care.  A copy of this report was sent to the requesting provider on this date.  Electronically Signed: Soyla Dryer, AGACNP-BC [phone] 08/03/2022, 11:56 AM   I spent a total of  30 Minutes   in face to face in clinical consultation, greater than 50% of which was counseling/coordinating care for bone marrow biopsy with aspiration.

## 2022-08-04 ENCOUNTER — Ambulatory Visit (HOSPITAL_COMMUNITY)
Admission: RE | Admit: 2022-08-04 | Discharge: 2022-08-04 | Disposition: A | Discharge status: Home / Self Care | Payer: Medicare Other | Source: Ambulatory Visit | Attending: Hematology & Oncology | Admitting: Hematology & Oncology

## 2022-08-04 ENCOUNTER — Encounter (HOSPITAL_COMMUNITY): Payer: Self-pay

## 2022-08-04 ENCOUNTER — Ambulatory Visit: Payer: Medicare Other | Admitting: Hematology & Oncology

## 2022-08-04 ENCOUNTER — Inpatient Hospital Stay: Payer: Medicare Other

## 2022-08-04 DIAGNOSIS — Z955 Presence of coronary angioplasty implant and graft: Secondary | ICD-10-CM | POA: Diagnosis not present

## 2022-08-04 DIAGNOSIS — Z1379 Encounter for other screening for genetic and chromosomal anomalies: Secondary | ICD-10-CM | POA: Insufficient documentation

## 2022-08-04 DIAGNOSIS — I4729 Other ventricular tachycardia: Secondary | ICD-10-CM | POA: Insufficient documentation

## 2022-08-04 DIAGNOSIS — D649 Anemia, unspecified: Secondary | ICD-10-CM | POA: Diagnosis not present

## 2022-08-04 DIAGNOSIS — R42 Dizziness and giddiness: Secondary | ICD-10-CM | POA: Insufficient documentation

## 2022-08-04 DIAGNOSIS — I2510 Atherosclerotic heart disease of native coronary artery without angina pectoris: Secondary | ICD-10-CM | POA: Insufficient documentation

## 2022-08-04 DIAGNOSIS — M549 Dorsalgia, unspecified: Secondary | ICD-10-CM | POA: Insufficient documentation

## 2022-08-04 DIAGNOSIS — C9000 Multiple myeloma not having achieved remission: Secondary | ICD-10-CM | POA: Insufficient documentation

## 2022-08-04 DIAGNOSIS — D472 Monoclonal gammopathy: Secondary | ICD-10-CM

## 2022-08-04 DIAGNOSIS — I252 Old myocardial infarction: Secondary | ICD-10-CM | POA: Diagnosis not present

## 2022-08-04 LAB — CBC WITH DIFFERENTIAL/PLATELET
Abs Immature Granulocytes: 0.05 10*3/uL (ref 0.00–0.07)
Basophils Absolute: 0.1 10*3/uL (ref 0.0–0.1)
Basophils Relative: 1 %
Eosinophils Absolute: 0.4 10*3/uL (ref 0.0–0.5)
Eosinophils Relative: 5 %
HCT: 29 % — ABNORMAL LOW (ref 39.0–52.0)
Hemoglobin: 9.4 g/dL — ABNORMAL LOW (ref 13.0–17.0)
Immature Granulocytes: 1 %
Lymphocytes Relative: 28 %
Lymphs Abs: 2 10*3/uL (ref 0.7–4.0)
MCH: 31.3 pg (ref 26.0–34.0)
MCHC: 32.4 g/dL (ref 30.0–36.0)
MCV: 96.7 fL (ref 80.0–100.0)
Monocytes Absolute: 0.8 10*3/uL (ref 0.1–1.0)
Monocytes Relative: 11 %
Neutro Abs: 3.9 10*3/uL (ref 1.7–7.7)
Neutrophils Relative %: 54 %
Platelets: 135 10*3/uL — ABNORMAL LOW (ref 150–400)
RBC: 3 MIL/uL — ABNORMAL LOW (ref 4.22–5.81)
RDW: 13.2 % (ref 11.5–15.5)
WBC: 7.1 10*3/uL (ref 4.0–10.5)
nRBC: 0 % (ref 0.0–0.2)

## 2022-08-04 LAB — GLUCOSE, CAPILLARY: Glucose-Capillary: 137 mg/dL — ABNORMAL HIGH (ref 70–99)

## 2022-08-04 MED ORDER — MIDAZOLAM HCL 2 MG/2ML IJ SOLN
INTRAMUSCULAR | Status: AC | PRN
  Administered 2022-08-04: 1 mg via INTRAVENOUS
  Administered 2022-08-04 (×2): .5 mg via INTRAVENOUS

## 2022-08-04 MED ORDER — SODIUM CHLORIDE 0.9 % IV SOLN: INTRAVENOUS | Status: DC

## 2022-08-04 MED ORDER — FENTANYL CITRATE (PF) 100 MCG/2ML IJ SOLN
INTRAMUSCULAR | Status: AC
  Filled 2022-08-04: qty 4

## 2022-08-04 MED ORDER — MIDAZOLAM HCL 2 MG/2ML IJ SOLN
INTRAMUSCULAR | Status: AC
  Filled 2022-08-04: qty 4

## 2022-08-04 MED ORDER — FENTANYL CITRATE (PF) 100 MCG/2ML IJ SOLN
INTRAMUSCULAR | Status: AC | PRN
  Administered 2022-08-04: 25 ug via INTRAVENOUS
  Administered 2022-08-04: 50 ug via INTRAVENOUS
  Administered 2022-08-04: 25 ug via INTRAVENOUS

## 2022-08-04 NOTE — Discharge Instructions (Signed)
Please call Interventional Radiology clinic [phone] with any questions or concerns.  You may remove your dressing and shower tomorrow.   Moderate Conscious Sedation, Adult, Care After This sheet gives you information about how to care for yourself after your procedure. Your health care provider may also give you more specific instructions. If you have problems or questions, contact your health careprovider. What can I expect after the procedure? After the procedure, it is common to have: Sleepiness for several hours. Impaired judgment for several hours. Difficulty with balance. Vomiting if you eat too soon. Follow these instructions at home: For the time period you were told by your health care provider: Rest. Do not participate in activities where you could fall or become injured. Do not drive or use machinery. Do not drink alcohol. Do not take sleeping pills or medicines that cause drowsiness. Do not make important decisions or sign legal documents. Do not take care of children on your own. Eating and drinking  Follow the diet recommended by your health care provider. Drink enough fluid to keep your urine pale yellow. If you vomit: Drink water, juice, or soup when you can drink without vomiting. Make sure you have little or no nausea before eating solid foods.  General instructions Take over-the-counter and prescription medicines only as told by your health care provider. Have a responsible adult stay with you for the time you are told. It is important to have someone help care for you until you are awake and alert. Do not smoke. Keep all follow-up visits as told by your health care provider. This is important. Contact a health care provider if: You are still sleepy or having trouble with balance after 24 hours. You feel light-headed. You keep feeling nauseous or you keep vomiting. You develop a rash. You have a fever. You have redness or swelling around the IV  site. Get help right away if: You have trouble breathing. You have new-onset confusion at home. Summary After the procedure, it is common to feel sleepy, have impaired judgment, or feel nauseous if you eat too soon. Rest after you get home. Know the things you should not do after the procedure. Follow the diet recommended by your health care provider and drink enough fluid to keep your urine pale yellow. Get help right away if you have trouble breathing or new-onset confusion at home. This information is not intended to replace advice given to you by your health care provider. Make sure you discuss any questions you have with your healthcare provider. Document Revised: 01/19/2020 Document Reviewed: 08/17/2019 Elsevier Patient Education  2022 Elsevier Inc.   Bone Marrow Aspiration and Bone Marrow Biopsy, Adult, Care After This sheet gives you information about how to care for yourself after your procedure. Your health care provider may also give you more specific instructions. If you have problems or questions, contact your health careprovider. What can I expect after the procedure? After the procedure, it is common to have: Mild pain and tenderness. Swelling. Bruising. Follow these instructions at home: Puncture site care Follow instructions from your health care provider about how to take care of the puncture site. Make sure you: Wash your hands with soap and water before and after you change your bandage (dressing). If soap and water are not available, use hand sanitizer. Change your dressing as told by your health care provider. Check your puncture site every day for signs of infection. Check for: More redness, swelling, or pain. Fluid or blood. Warmth. Pus or a   bad smell.  Activity Return to your normal activities as told by your health care provider. Ask your health care provider what activities are safe for you. Do not lift anything that is heavier than 10 lb (4.5 kg), or the  limit that you are told, until your health care provider says that it is safe. Do not drive for 24 hours if you were given a sedative during your procedure. General instructions Take over-the-counter and prescription medicines only as told by your health care provider. Do not take baths, swim, or use a hot tub until your health care provider approves. Ask your health care provider if you may take showers. You may only be allowed to take sponge baths. If directed, put ice on the affected area. To do this: Put ice in a plastic bag. Place a towel between your skin and the bag. Leave the ice on for 20 minutes, 2-3 times a day. Keep all follow-up visits as told by your health care provider. This is important.  Contact a health care provider if: Your pain is not controlled with medicine. You have a fever. You have more redness, swelling, or pain around the puncture site. You have fluid or blood coming from the puncture site. Your puncture site feels warm to the touch. You have pus or a bad smell coming from the puncture site. Summary After the procedure, it is common to have mild pain, tenderness, swelling, and bruising. Follow instructions from your health care provider about how to take care of the puncture site and what activities are safe for you. Take over-the-counter and prescription medicines only as told by your health care provider. Contact a health care provider if you have any signs of infection, such as fluid or blood coming from the puncture site. This information is not intended to replace advice given to you by your health care provider. Make sure you discuss any questions you have with your healthcare provider. Document Revised: 02/07/2019 Document Reviewed: 02/07/2019 Elsevier Patient Education  2022 Elsevier Inc.  

## 2022-08-04 NOTE — Procedures (Signed)
Vascular and Interventional Radiology Procedure Note  Patient: Alexander Armstrong DOB: 04/04/1941 Medical Record Number: [id number] Note Date/Time: 08/04/22 10:31 AM   Performing Physician: Michaelle Birks, MD Assistant(s): None  Diagnosis: Q MM  Procedure: BONE MARROW ASPIRATION and BIOPSY  Anesthesia: Conscious Sedation Complications: None Estimated Blood Loss: Minimal Specimens: Sent for Pathology  Findings:  Successful CT-guided bone marrow aspiration and biopsy A total of 1 cores were obtained. Hemostasis of the tract was achieved using Manual Pressure.  Plan: Bed rest for 1 hours.  See detailed procedure note with images in PACS. The patient tolerated the procedure well without incident or complication and was returned to Recovery in stable condition.    Michaelle Birks, MD [facility]   Pager. [place]

## 2022-08-06 ENCOUNTER — Ambulatory Visit: Payer: Medicare Other | Attending: Family Medicine | Admitting: Physical Therapy

## 2022-08-06 ENCOUNTER — Ambulatory Visit (HOSPITAL_COMMUNITY): Payer: Medicare Other

## 2022-08-06 ENCOUNTER — Encounter: Payer: Self-pay | Admitting: Physical Therapy

## 2022-08-06 DIAGNOSIS — R2681 Unsteadiness on feet: Secondary | ICD-10-CM | POA: Diagnosis present

## 2022-08-06 DIAGNOSIS — M6281 Muscle weakness (generalized): Secondary | ICD-10-CM | POA: Insufficient documentation

## 2022-08-06 DIAGNOSIS — R42 Dizziness and giddiness: Secondary | ICD-10-CM | POA: Diagnosis present

## 2022-08-06 DIAGNOSIS — R278 Other lack of coordination: Secondary | ICD-10-CM | POA: Diagnosis present

## 2022-08-06 DIAGNOSIS — R262 Difficulty in walking, not elsewhere classified: Secondary | ICD-10-CM | POA: Insufficient documentation

## 2022-08-06 LAB — SURGICAL PATHOLOGY

## 2022-08-06 NOTE — Therapy (Signed)
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 Ant Stemi (11/03/04) mLAD - PCI: Taxus DES 3.0 x 20; b. (11/06/04) staged RCA DES PCI: Taxus DES 3.5 x 16; c. (01/2017) High Risk Myoview (~fixed anterior defect with normal WM) @ [facility] (Dr. [name]) Synergy DES 3.0 x 12 (for 99% lesion @ prox edge of old stent   Cardiac syncope    No episode since starting flecainide. Had documented WIDE COMPLEX Tachycardia on loop recorder.   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 Unable to induce during EP study.;  Due to CAD, flecainide discontinued -> intolerant of both mexiletine and amiodarone -> Dr. Curt Bears (EP-Cards) d/c'd mexilitine & statrted low dose Diltizem XT; Holter Monitor 12/2018 - HR 47-114, rate PACs/PVCs, no Afib/flutter or SVT, VT.    Reflux gastritis    STEMI involving left anterior descending coronary artery ([place]) 11/03/2004   [facility]: Occluded LAD treated with DES. Staged PCI to the RCA.   Past Surgical History:  Procedure Laterality Date   CARDIAC CATHETERIZATION  ~2011   [facility]: re-look Cath - patent stents   CORONARY STENT INTERVENTION N/A 01/27/2017    Procedure: Coronary Stent Intervention;  Surgeon: [name], MD;  Location: [facility] LAB: mLAD (just after D1) overlapping prior DES -> SYNERGY DES 3X12 drug eluting stent   ELECTROPHYSIOLOGIC STUDY  2014   Unable to stimulate VT seen on loop recorder   HOLTER MONITOR  12/2018    (Off of mexiletine, only on diltiazem) heart rate ranged from 47 to 114 bpm.  Rare PACs and PVCs.  Sinus rhythm noted no A. fib or other arrhythmia.   implanted loop heart monitor x2  ~2011; 2014   Per report- batter out of date; apparently captured a prolonged episode of wide complex tachycardia associated with an episode of weakness/near syncope   INTRAVASCULAR PRESSURE WIRE/FFR STUDY N/A 01/27/2017   Procedure: Intravascular Pressure Wire/FFR Study;  Surgeon: [name], MD;  Location: [facility];  Service: Cardiovascular;  Laterality: N/A;   LEFT HEART CATH AND CORONARY ANGIOGRAPHY N/A 01/27/2017   Procedure: Left Heart Cath and Coronary Angiography;  Surgeon: [name], MD;  Location: [facility] INVASIVE CV LAB: mLAD 99% stenosis pre-Stent & ISR --> PCI. Ost DI ~50%. pRCA DES ~50-60% FFR 0.86.   NM MYOVIEW LTD  07/2014   Unremarkable EKG. No evidence of ischemia or infarct. Mild anterior attenuation, cannot exclude prior infarct, but nonreversible. EF 66%.   NM MYOVIEW LTD  12/22/2016   INTERMEDIATE  RISK. EF 58%. Defect 1: Medium size severe defect in the mid anterior, apical anterior and apical wall consistent with prior anterior MI. Defect to: Large defect and moderate severity in the basal inferoseptal basal inferior and inferoseptal mid inferior wall consistent with ischemia.   NM MYOVIEW LTD  04/05/2019   EF 55 to 65%.  Small size, mild severity fixed perfusion defect in the mid-apical anterior septal wall.  Likely represents old anterior infarct.   No evidence of ischemia. LOW RISK.    PERCUTANEOUS CORONARY STENT INTERVENTION (PCI-S)  January-February 2006   a. mLAD - Taxus DES 3.0 x 20; b.  (11/06/04) staged PCI to RCA Taxus DES 3.5 x 16; c. 01/27/2017: PCI to mLAD prox to old  stent - Synergy DES 3.0 x 12   TRANSTHORACIC ECHOCARDIOGRAM  10/11/2020    [facility]):  Normal LV size & function. EF 55-60%. NO RWMA. Normal filling parameters.  Mild TR however estimated moderate pulmonary hypertension with RVSP 52 mmHg.  IVC is mildly dilated.  No vegetations noted.  No significant change from last study.   TRANSTHORACIC ECHOCARDIOGRAM  05/2020   [facility]: 05/21/2020: Normal LV size and function.  Normal valves.  No effusion.; 06/04/2020: Normal LV size and function EF 60 to 65%.  Trace MR and TR.  No pulmonary hypertension.  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 10/08/2017   Other acquired hammer toe 05/27/2017   Wide-complex tachycardia - WCT ([place]) 12/09/2016   Urge incontinence 12/09/2016   Cardiac syncope 03/10/2016   Acute right-sided low back pain without sciatica 11/18/2015   BPH associated with nocturia 11/18/2015   Cardiac device in situ 11/18/2015   Gastroesophageal reflux disease without esophagitis 11/18/2015   Ingrown nail 11/18/2015   Palpitations 11/18/2015   PVD (posterior vitreous detachment), both eyes 11/18/2015   Type 2 diabetes mellitus without complication, without long-term current use of insulin ([place]) 75/64/3329   Umbilical hernia 51/88/4166   Paroxysmal VT ([place]) 09/17/2015   Personal history of other malignant neoplasm of skin 07/20/2011   Hyperlipidemia LDL goal <70 01/26/2008   History of acute anterior wall MI 01/26/2008   ESOPHAGEAL STRICTURE 12/26/2007   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   HIATAL HERNIA 12/26/2007   DIVERTICULOSIS, COLON 12/26/2007   Coronary artery disease involving native coronary artery of native heart without  angina pectoris 11/03/2004    PCP: Marin Olp, MD   REFERRING PROVIDER: Marin Olp, MD   REFERRING DIAG: Diagnosis R26.89 (ICD-10-CM) - Balance disorder R29.898 (ICD-10-CM) - Weakness of both lower extremities   THERAPY DIAG:  Difficulty in walking, not elsewhere classified  Muscle weakness (generalized)  Unsteadiness on feet  Dizziness and giddiness  Other lack of coordination  Rationale for Evaluation and Treatment Rehabilitation  ONSET DATE: 06/24/2022   SUBJECTIVE:   SUBJECTIVE STATEMENT: Patient reports that he had a biopsy on Tues, but does not have the results back.  PERTINENT HISTORY: #balance issues/leg weakness- worse since covid in 2021 (severe illness 8 weeks in hospital and then rehab) but has had issues for years. PT was helpful in the past- refer back today to [facility] location  -also reports thigh pain with walking in big stores PAIN:  Are you having pain? Yes: NPRS scale: 6/10 Pain location: low  back Pain description: aching Aggravating factors: twisting while lifting Relieving factors: Proper body mechanics  PRECAUTIONS: None  WEIGHT BEARING RESTRICTIONS: No  FALLS:  Has patient fallen in last 6 months? 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   OBJECTIVE:   DIAGNOSTIC FINDINGS: IMPRESSION: 1. 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 2. L1-2 and L3-4 left foraminal impingement, greater at L3-4 where the impingement is from a herniation. 3. 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  MUSCLE LENGTH: Hamstrings: Right 70 deg; Left 70 deg Thomas test: B hip flexors tight  POSTURE: rounded shoulders, forward head, left pelvic obliquity, and patient reports scoliosis, rib hump on L.  PALPATION: TTP on lower back paraspinals.  LOWER EXTREMITY ROM:  B LE WFL, tightness in knee to chest and hip IR/ER.  LOWER EXTREMITY MMT:  MMT Right eval Left eval  Hip flexion 4- 4-  Hip extension    Hip abduction    Hip adduction    Hip internal rotation 3+ 3+  Hip external rotation 3+ 3+  Knee flexion 4- 4-  Knee extension 4- 4-  Ankle dorsiflexion 4- 4-  Ankle plantarflexion    Ankle inversion    Ankle eversion     (Blank rows = not tested)  LOWER EXTREMITY SPECIAL TESTS:  Slump test- neg  FUNCTIONAL TESTS:  5 times sit to stand: 12.06 Timed up and go (TUG): N/T Berg Balance Scale: 47 MCTSIB: Condition 1: Avg of 3 trials: 30 sec, Condition 2: Avg of 3 trials: 30 sec, Condition 3: Avg of 3 trials: 30 sec, Condition 4: Avg of 3 trials: 28 sec, and Total Score: 118/120 Patient with increased unsteadiness with eyes closed and on soft surface.  GAIT: Distance walked: 80 Assistive device utilized: None Level of assistance: Complete Independence Comments: WFL-reports limited in distance due to back pain.   TODAY'S TREATMENT                                                                                                                            DATE: 08/06/22 Recumbent bike L3 x 6 minutes Side stepping onto and off of airex pad x 10 each way Sit to stand from mat while holding 2# ball in BUE out in front of trunk   x 10 Repeat with OHP, mini squats instead of STS B side stepping against G tband resistance, 15 reps to each side Heel raises, 2 x 10 reps Alternating step taps, progressing to multiple  taps with each foot, then tapping with rotation, and finally, placing one  foot on the step and rotating trunk, 10 reps each. Floor ladder, quick steps, then side stepping, then changing directions without warning. Occasional unsteadiness, but no LOB.  07/31/22 Nustep L 5 27mn Knee ext 2 sets 10  10# HS curl 2 sets 10  20# Resisted gait 4 ways 4 x each 20# STS on airex 10x- slightly elevated mat Foam beam side step and tandem in //bars 4 x each Red tband hip 3 way in // bars 15 x each  Education HEP for strength and balance. Hip 3 way red tband. Balance at counter marching fwd and back,side stepping, tandem and SLS  PATIENT EDUCATION:  Education details: HEP Person educated: Patient Education method: ECustomer service managerand Handout Education comprehension: verbalized understanding  HOME EXERCISE PROGRAM: See above  ASSESSMENT:  CLINICAL IMPRESSION: Progressed balance, speed of movement, strengthening. Patient required occasional rest breaks due to SOB.  OBJECTIVE IMPAIRMENTS: decreased balance, decreased coordination, decreased endurance, difficulty walking, decreased ROM, decreased strength, impaired flexibility, improper body mechanics, postural dysfunction, and pain.   ACTIVITY LIMITATIONS: carrying, lifting, bending, squatting, stairs, and locomotion level  PARTICIPATION LIMITATIONS: cleaning and community activity  PERSONAL FACTORS: Age are also affecting patient's functional outcome.   REHAB POTENTIAL: Good  CLINICAL DECISION MAKING: Stable/uncomplicated  EVALUATION COMPLEXITY: Moderate   GOALS: Goals reviewed with patient? Yes  SHORT TERM GOALS: Target date: 08/21/2022  I with basic HEP Baseline: Goal status: 07/31/22 MET  LONG TERM GOALS: Target date: 10/02/2022   I with final HEP Baseline:  Goal status: INITIAL  2.  Increase BERG score to at least 50 Baseline:  Goal status: ongoing  3.  Increase BLE strength to at least 4/5 throughout Baseline:  Goal status: INITIAL  4.  Patient will report no falls/near  falls x 4 weeks in a row. Baseline: Multiple episodes of unsteadiness. Goal status: INITIAL  5.  Patient will ambulate at least 400' with no LOB, no C/O pain Baseline:  Goal status: ongoing  PLAN:  PT FREQUENCY: 2x/week  PT DURATION: 10 weeks  PLANNED INTERVENTIONS: Therapeutic exercises, Therapeutic activity, Neuromuscular re-education, Balance training, Gait training, Patient/Family education, Self Care, Joint mobilization, Stair training, Dry Needling, Cryotherapy, Moist heat, Traction, Ionotophoresis 443mml Dexamethasone, and Manual therapy  PLAN FOR NEXT SESSION: check HEP compliance and progress   AnFranki MonteTA 08/06/2022, 8:42 AM CoPick CityGrSt. [id number]: [phone] Fax:  [phone] Details  Name: ChJasmond RiverRN: [id number] of Birth: 8/07-26-1942eferring Provider:  HuMarin OlpMD  Encounter Date: 08/06/2022   SuMarcelina MorelDPT 08/06/2022, 8:42 AM  CoShepherds[id number]: [phone] Fax:  [phone]

## 2022-08-07 ENCOUNTER — Ambulatory Visit: Payer: Medicare Other | Attending: Cardiology | Admitting: Cardiology

## 2022-08-07 ENCOUNTER — Encounter: Payer: Self-pay | Admitting: Cardiology

## 2022-08-07 VITALS — BP 130/60 | HR 86 | Ht 72.0 in | Wt 204.4 lb

## 2022-08-07 DIAGNOSIS — G903 Multi-system degeneration of the autonomic nervous system: Secondary | ICD-10-CM | POA: Insufficient documentation

## 2022-08-07 DIAGNOSIS — E1169 Type 2 diabetes mellitus with other specified complication: Secondary | ICD-10-CM | POA: Diagnosis present

## 2022-08-07 DIAGNOSIS — I4729 Other ventricular tachycardia: Secondary | ICD-10-CM

## 2022-08-07 DIAGNOSIS — E785 Hyperlipidemia, unspecified: Secondary | ICD-10-CM | POA: Diagnosis present

## 2022-08-07 DIAGNOSIS — E119 Type 2 diabetes mellitus without complications: Secondary | ICD-10-CM | POA: Diagnosis present

## 2022-08-07 DIAGNOSIS — C9000 Multiple myeloma not having achieved remission: Secondary | ICD-10-CM

## 2022-08-07 DIAGNOSIS — R000 Tachycardia, unspecified: Secondary | ICD-10-CM | POA: Diagnosis present

## 2022-08-07 DIAGNOSIS — I2510 Atherosclerotic heart disease of native coronary artery without angina pectoris: Secondary | ICD-10-CM | POA: Diagnosis not present

## 2022-08-07 DIAGNOSIS — R6889 Other general symptoms and signs: Secondary | ICD-10-CM | POA: Diagnosis present

## 2022-08-07 DIAGNOSIS — R002 Palpitations: Secondary | ICD-10-CM | POA: Insufficient documentation

## 2022-08-07 NOTE — Progress Notes (Signed)
Primary Care Provider: Marin Armstrong, [place] Cardiologist: Alexander Hew, MD Electrophysiologist: Alexander [name], MD Hematologist/Oncologist: Dr. Marin Armstrong  Clinic Note: Chief Complaint  Patient presents with   Follow-up    Annual.  Doing well.  Was referred to heme-onc for anemia   Coronary Artery Disease    Stable, no angina   ===================================  ASSESSMENT/PLAN   Problem List Items Addressed This Visit       Cardiology Problems   Coronary artery disease involving native coronary artery of native heart without angina pectoris - Primary (Chronic)    He never really truly had angina despite having significant disease.  Last PCI of the LAD was in 2018.  Has been totally asymptomatic since then.  Had a Myoview July 2022 along with a normal echocardiogram.  We have beta-blocker because of fatigue and converted to low-dose long-acting diltiazem for blood pressure and antianginal effect (also for PVCs).  He is maintained on Plavix which would be okay to hold for procedures or surgeries.  (For bone marrow biopsy would hold a minimum of 7 days prior to procedure and likely 3 days post).      Relevant Orders   EKG 12-Lead (Completed)   Hyperlipidemia associated with type 2 diabetes mellitus ([place]) (Chronic)    Most recent labs are from May 2023.  Well-controlled with LDL of 66. Continue current dose of rosuvastatin.      Hypotension (Chronic)    Blood pressure stabilized now finally.  In normal range, doing well with diltiazem.  For now we Alexander hold off in any additional medications because of history of orthostatic hypotension.      Paroxysmal VT (HCC) (Chronic)    Thankfully, no recurrent episodes of VT.  Seems to be stable now on current dose of diltiazem and has not used any additional doses of PRN short acting diltiazem.  EF remains stable.        Other   Exercise intolerance (Chronic)    Gradually improving.  Energy is better  although he is now anemic.  Was evaluated with an echocardiogram and Myoview last year, stable.      Wide-complex tachycardia - WCT (HCC) (Chronic)    No further episodes symptomatically.  Remains on diltiazem.      Multiple myeloma (HCC) (Chronic)    Suspect that he may need bone marrow biopsy in the future.  Okay to hold Plavix preprocedure as noted.      Palpitations (Chronic)   Relevant Orders   EKG 12-Lead (Completed)   Type 2 diabetes mellitus without complication, without long-term current use of insulin (HCC) (Chronic)    Most recent A1c was 7.7. With weight gain, may want to consider initiating treatment agonist because of issues with orthostatic hypotension in the past, would probably hold off on SGLT2 inhibitors.      ===================================  HPI:    Alexander Armstrong is a 81 y.o. male with a PMH notable for CAD-PCI, h/o Paroxysmal VT with CRFs of DM-2 (most recent A1c 7.7), HLD and HTN, along with long-haul COVID (prolonged hospitalization August through September 2021 followed by double lung pneumonia December 21-January 22) who presents today for annual follow-up with no major complaints.Alexander Armstrong was last seen on August 26, 2021 for 32-monthfollow-up.  He was doing well.  No major issues to his off-and-on chest twinges but no change in baseline.  Nothing concerning.  Happy to be 1 year out from his prolonged hospitalization for COVID.  Was  gradually gaining strength and energy level back but still having balance issues.  95% back to baseline.  Exertional dyspnea with overdoing it. Continued on low-dose diltiazem without beta-blocker.  (Beta-blocker previously stopped because of fatigue)  ED transfer primary care to Dr. Yong Armstrong.  Most recently seen in September-noted to have thrombocytopenia and anemia.  Follow-up labs checked, due to findings, referred to Hematology/Oncology-Dr. 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  He also referred  her back to physical therapy for balance issues.  He was seen on October 2 by Dr. Marin Armstrong who also identifiedElevated kappa light chain of 7.3 mg/dl. => Likely would need a bone marrow biopsy.  He was set up for bone survey to look for lytic lesions.  Also ordered 24-hour urine for UPEP.  Otherwise noted to be in great shape.  He was impressed with Alexander Armstrong's vigor and stamina.  Staying in great shape. => No evidence of lytic lesions.  Recent Hospitalizations: None  Reviewed  CV studies:    The following studies were reviewed today: (if available, images/films reviewed: From Epic Chart or Care Everywhere) ABIs 07/14/2022: Normal ABIs bilaterally (R ABI 1.22, L ABI 1.28).  Normal TBI's bilaterally (R TBI 0.75,, L TBI 0.97).-Suspect calcified hardening of the arteries.  Interval History:   Alexander Armstrong returns here today overall doing quite well from a cardiac standpoint.  He was also very impressed with Dr. Yong Armstrong and his thoroughness.  He was happy to been referred to Dr. Marin Armstrong.  Eagerly awaiting the results of additional testing.  Despite having anemia he did really denies any real fatigue or exercise intolerance, just notes persistent fatigue and balance issues from having his COVID infection and hospitalization.  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  He has not had any angina or heart failure symptoms.  No arrhythmia symptoms.  No syncope or near syncope.  No TIA or amaurosis fugax.  No claudication.  CV Review of Symptoms (Summary): no chest pain or dyspnea on exertion positive for - mild residual exercise intolerance and poor balance negative for - edema, irregular heartbeat, orthopnea, palpitations, paroxysmal nocturnal dyspnea, rapid heart rate, shortness of breath, or syncope/presyncope or TIA/amaurosis fugax, claudication  REVIEWED OF SYSTEMS   Review of Systems  Constitutional:  Positive for malaise/fatigue (Mild exercise intolerance and  fatigue-potentially plan to anemia;). Negative for weight loss (He is actually put back on a lot of the weight that he lost during his critical illness.).  HENT:  Negative for congestion and nosebleeds.   Respiratory: Negative.    Cardiovascular:  Negative for leg swelling.       Per HPI  Gastrointestinal:  Negative for abdominal pain, blood in stool and melena.  Genitourinary:  Negative for hematuria.  Musculoskeletal:  Negative for falls and joint pain.  Neurological:  Negative for dizziness (Poor balance since COVID hospitalization), focal weakness and weakness.  Endo/Heme/Allergies:  Does not bruise/bleed easily.  Psychiatric/Behavioral: Negative.      I have reviewed and (if needed) personally updated the patient's problem list, medications, allergies, past medical and surgical history, social and family history.   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 Ant Stemi (11/03/04) mLAD - PCI: Taxus DES 3.0 x 20; b. (11/06/04) staged RCA DES PCI: Taxus DES 3.5 x  16; c. (01/2017) High Risk Myoview (~fixed anterior defect with normal WM) @ [facility] (Dr. [name]) Synergy DES 3.0 x 12 (for 99% lesion @ prox edge of old stent   Cardiac syncope    No episode since starting flecainide. Had documented WIDE COMPLEX Tachycardia on loop recorder.   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 Unable to induce during EP study.;  Due to CAD, flecainide discontinued -> intolerant of both mexiletine and amiodarone -> Dr. Curt Bears (EP-Cards) d/c'd mexilitine & statrted low dose Diltizem XT;  Holter Monitor 12/2018 - HR 47-114, rate PACs/PVCs, no Afib/flutter or SVT, VT.    Reflux gastritis    STEMI involving left anterior descending coronary artery ([place]) 11/03/2004   [facility]: Occluded LAD treated with DES. Staged PCI to the RCA.    PAST SURGICAL HISTORY   Past Surgical History:  Procedure Laterality Date   CARDIAC CATHETERIZATION  ~2011   [facility]: re-look Cath - patent stents   CORONARY STENT INTERVENTION N/A 01/27/2017   Procedure: Coronary Stent Intervention;  Surgeon: [name], MD;  Location: [facility] INVASIVE CV LAB: mLAD (just after D1) overlapping prior DES -> SYNERGY DES 3X12 drug eluting stent   ELECTROPHYSIOLOGIC STUDY  2014   Unable to stimulate VT seen on loop recorder   HOLTER MONITOR  12/2018    (Off of mexiletine, only on diltiazem) heart rate ranged from 47 to 114 bpm.  Rare PACs and PVCs.  Sinus rhythm noted no A. fib or other arrhythmia.   implanted loop heart monitor x2  ~2011; 2014   Per report- batter out of date; apparently captured a prolonged episode of wide complex tachycardia associated with an episode of weakness/near syncope   INTRAVASCULAR PRESSURE WIRE/FFR STUDY N/A 01/27/2017   Procedure: Intravascular Pressure Wire/FFR Study;  Surgeon: [name], MD;  Location: [facility] CV LAB;  Service: Cardiovascular;  Laterality: N/A;   LEFT HEART CATH AND CORONARY ANGIOGRAPHY N/A 01/27/2017   Procedure: Left Heart Cath and Coronary Angiography;  Surgeon: [name], MD;  [facility] INVASIVE CV LAB: mLAD 99% stenosis pre-Stent & ISR --> PCI. Ost DI ~50%. pRCA DES ~50-60% FFR 0.86.   NM MYOVIEW LTD  07/2014   Unremarkable EKG. No evidence of ischemia or infarct. Mild anterior attenuation, cannot exclude prior infarct, but nonreversible. EF 66%.   NM MYOVIEW LTD  12/22/2016   INTERMEDIATE RISK. EF 58%. Defect 1: Medium size severe defect in the mid anterior, apical anterior and apical wall consistent with prior anterior MI. Defect  to: Large defect and moderate severity in the basal inferoseptal basal inferior and inferoseptal mid inferior wall consistent with ischemia.   NM MYOVIEW LTD  04/05/2019   EF 55 to 65%.  Small size, mild severity fixed perfusion defect in the mid-apical anterior septal wall.  Likely represents old anterior infarct.   No evidence of ischemia. LOW RISK.    PERCUTANEOUS CORONARY STENT INTERVENTION (PCI-S)  January-February 2006   a. mLAD - Taxus DES 3.0 x 20; b. (11/06/04) staged PCI to RCA Taxus DES 3.5 x 16; c. 01/27/2017: PCI to mLAD prox to old  stent - Synergy DES 3.0 x 12   TRANSTHORACIC ECHOCARDIOGRAM  10/11/2020    [facility]):  Normal  LV size & function. EF 55-60%. NO RWMA. Normal filling parameters.  Mild TR however estimated moderate pulmonary hypertension with RVSP 52 mmHg.  IVC is mildly dilated.  No vegetations noted.  No significant change from last study.   TRANSTHORACIC ECHOCARDIOGRAM  05/2020   [facility]: 05/21/2020: Normal LV size and function.  Normal valves.  No effusion.; 06/04/2020: Normal LV size and function EF 60 to 65%.  Trace MR and TR.  No pulmonary hypertension.  Stable   UMBILICAL HERNIA REPAIR     Most Recent Cath -> (01/2017).  @ [facility] (Dr. [name]) CATH-PCI LAD: Prox RCA 55% ISR (FFR 0.86), 50% D1 @ bifurcation. Synergy DES 3.0 x 12 (for 99% mLAD lesion @ prox edge of old stent) - overlaps old stent with PTCA of old stent ISR>              Immunization History  Administered Date(s) Administered   Influenza, High Dose Seasonal PF 08/03/2018   Influenza-Unspecified 06/17/2012   Pneumococcal Conjugate-13 03/20/2014   Pneumococcal Polysaccharide-23 05/06/2011   Td 12/23/2009   Zoster, Live 02/23/2013    MEDICATIONS/ALLERGIES   Current Meds  Medication Sig   acetaminophen (TYLENOL) 500 MG tablet Take 1,000 mg by mouth 3 (three) times daily as needed for moderate pain or headache.   Cholecalciferol (VITAMIN D3) 1000 units CAPS Take 1,000 Units by mouth at  bedtime.   clopidogrel (PLAVIX) 75 MG tablet TAKE 1 TABLET(75 MG) BY MOUTH DAILY   diltiazem (CARDIZEM) 60 MG tablet Take 1 tablet (60 mg total) by mouth 4 (four) times daily. As needed for fast heart rate (Patient not taking: Reported on 08/13/2022)   diltiazem (DILT-XR) 120 MG 24 hr capsule Take 1 capsule (120 mg total) by mouth daily.   pantoprazole (PROTONIX) 40 MG tablet Take 1 tablet (40 mg total) by mouth daily.   rosuvastatin (CRESTOR) 20 MG tablet Take 1 tablet (20 mg total) by mouth daily.   tamsulosin (FLOMAX) 0.4 MG CAPS capsule Take 0.4 mg by mouth daily. Dr. Yong Armstrong willing to refill    Allergies  Allergen Reactions   Silver Sulfadiazine Hives and Rash   Latex Other (See Comments)    "takes the skin off"   Adhesive [Tape] Rash   Hydrocodone Rash    SOCIAL HISTORY/FAMILY HISTORY   Reviewed in Epic:  Pertinent findings:  Social History   Tobacco Use   Smoking status: Never   Smokeless tobacco: Never  Vaping Use   Vaping Use: Never used  Substance Use Topics   Alcohol use: No   Drug use: No   Social History   Social History Narrative   Married 33 years in 2023. 1 child Conne in [place].  1 grandchild in [place].       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       Hobbies: work, singing in church, some guitar (owns several guitars)    [place] -PE, EKG, labs   Wt Readings from Last 3 Encounters:  08/13/22 201 lb (91.2 kg)  08/07/22 204 lb 6.4 oz (92.7 kg)  08/04/22 203 lb (92.1 kg)    Physical Exam: BP 130/60   Pulse 86   Ht 6' (1.829 m)   Wt 204 lb 6.4 oz (92.7 kg)   SpO2 98%   BMI 27.72 kg/m  Physical Exam Vitals reviewed.  Constitutional:      General: He is not in acute distress.  Appearance: Normal appearance. He is normal weight. He is not ill-appearing (Well-nourished, well-groomed.  Appears younger than stated age.) or toxic-appearing.  HENT:     Head: Normocephalic and atraumatic.   Neck:     Vascular: No carotid bruit or JVD.  Cardiovascular:     Rate and Rhythm: Normal rate and regular rhythm. No extrasystoles are present.    Chest Wall: PMI is not displaced.     Pulses: Normal pulses.     Heart sounds: Normal heart sounds, S1 normal and S2 normal. No murmur heard.    No friction rub. No gallop.  Pulmonary:     Effort: Pulmonary effort is normal. No respiratory distress.     Breath sounds: Normal breath sounds. No wheezing, rhonchi or rales.  Abdominal:     General: Abdomen is flat. Bowel sounds are normal. There is no distension.     Palpations: Abdomen is soft. There is no mass.     Tenderness: There is no abdominal tenderness. There is no guarding.     Comments: No HSM or bruits  Musculoskeletal:        General: No swelling. Normal range of motion.     Cervical back: Normal range of motion and neck supple.  Skin:    General: Skin is warm and dry.  Neurological:     General: No focal deficit present.     Mental Status: He is alert and oriented to person, place, and time. Mental status is at baseline.     Gait: Gait normal.  Psychiatric:        Mood and Affect: Mood normal.        Behavior: Behavior normal.        Thought Content: Thought content normal.        Judgment: Judgment normal.     Adult ECG Report  Rate: 83;  Rhythm: normal sinus rhythm; cannot rule out inferior Mikan age-indeterminate.  Otherwise normal axis, intervals and durations.  Narrative Interpretation: Stable  Recent Labs: Reviewed Lab Results  Component Value Date   CHOL 119 02/02/2022   HDL 34 (A) 02/02/2022   LDLCALC 66 02/02/2022   TRIG 136 02/02/2022   Lab Results  Component Value Date   CREATININE 1.30 (H) 08/13/2022   BUN 23 08/13/2022   NA 136 08/13/2022   K 4.7 08/13/2022   CL 104 08/13/2022   CO2 26 08/13/2022      Latest Ref Rng & Units 08/13/2022    1:48 PM 08/04/2022    7:20 AM 07/06/2022   10:21 AM  CBC  WBC 4.0 - 10.5 K/uL 8.9  7.1  7.7    Hemoglobin 13.0 - 17.0 g/dL 9.9  9.4  10.3   Hematocrit 39.0 - 52.0 % 30.0  29.0  31.0   Platelets 150 - 400 K/uL 152  135  145   -Referred to Heme-Onc-Dr. Marin Armstrong for persistent anemia  Lab Results  Component Value Date   HGBA1C 7.7 (H) 06/24/2022   Lab Results  Component Value Date   TSH 2.44 04/05/2012    ================================================== I spent a total of 22 minutes with the patient spent in direct patient consultation.  Additional time spent with chart review  / charting (studies, outside notes, etc): 16 min Total Time: 38 min  Current medicines are reviewed at length with the patient today.  (+/- concerns) none  Notice: This dictation was prepared with Dragon dictation along with smart phrase technology. Any transcriptional errors that result from this process are  unintentional and may not be corrected upon review.  Studies Ordered:   Orders Placed This Encounter  Procedures   EKG 12-Lead   No orders of the defined types were placed in this encounter.   Patient Instructions / Medication Changes & Studies & Tests Ordered   Patient Instructions  Medication Instructions:  NO CHANGES     Lab Work:  NOT NEEDED   Testing/Procedures: NOT NEEDED   Follow-Up: At [facility], you and your health needs are our priority.  As part of our continuing mission to provide you with exceptional heart care, we have created designated Provider Care Teams.  These Care Teams include your primary Cardiologist (physician) and Advanced Practice Providers (APPs -  Physician Assistants and Nurse Practitioners) who all work together to provide you with the care you need, when you need it.     Your next appointment:   12 month(s)  The format for your next appointment:   In Person  Provider:   [name], MD         [name], MD, MS Alexander Armstrong, M.D., M.S. Interventional Cardiologist  [place]  Pager #  [phone] Phone # [phone] [address] [place], [place] [address]   Thank you for choosing [facility]!!

## 2022-08-07 NOTE — Patient Instructions (Signed)
Medication Instructions:  NO CHANGES     Lab Work:  NOT NEEDED   Testing/Procedures: NOT NEEDED   Follow-Up: At [facility], you and your health needs are our priority.  As part of our continuing mission to provide you with exceptional heart care, we have created designated Provider Care Teams.  These Care Teams include your primary Cardiologist (physician) and Advanced Practice Providers (APPs -  Physician Assistants and Nurse Practitioners) who all work together to provide you with the care you need, when you need it.     Your next appointment:   12 month(s)  The format for your next appointment:   In Person  Provider:   [name], MD

## 2022-08-10 ENCOUNTER — Encounter (HOSPITAL_COMMUNITY): Payer: Self-pay | Admitting: Hematology & Oncology

## 2022-08-11 ENCOUNTER — Ambulatory Visit: Payer: Medicare Other | Admitting: Physical Therapy

## 2022-08-11 DIAGNOSIS — M6281 Muscle weakness (generalized): Secondary | ICD-10-CM

## 2022-08-11 DIAGNOSIS — R262 Difficulty in walking, not elsewhere classified: Secondary | ICD-10-CM | POA: Diagnosis not present

## 2022-08-11 DIAGNOSIS — R2681 Unsteadiness on feet: Secondary | ICD-10-CM

## 2022-08-11 NOTE — Therapy (Signed)
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 Ant Stemi (11/03/04) mLAD - PCI: Taxus DES 3.0 x 20; b. (11/06/04) staged RCA DES PCI: Taxus DES 3.5 x 16; c. (01/2017) High Risk Myoview (~fixed anterior defect with normal WM) @ [facility] (Dr. [name]) Synergy DES 3.0 x 12 (for 99% lesion @ prox edge of old stent   Cardiac syncope    No episode since starting flecainide. Had documented WIDE COMPLEX Tachycardia on loop recorder.   Cataract bilateral cataracts   Diverticulosis    Esophageal stricture    GERD (gastroesophageal reflux disease)    Hiatal hernia 11/05/2010   HLD (hyperlipidemia)    Nonsustained paroxysmal ventricular tachycardia ([place]) 2014   Initially controlled with flecainide. Unable to induce during EP study.;  Due to CAD, flecainide discontinued -> intolerant of both mexiletine and amiodarone -> Dr. Curt Bears (EP-Cards) d/c'd mexilitine & statrted low dose Diltizem XT; Holter Monitor 12/2018 - HR 47-114, rate PACs/PVCs, no Afib/flutter or SVT, VT.    Reflux gastritis    STEMI involving left anterior descending coronary artery ([place]) 11/03/2004   [facility]: Occluded LAD treated with DES. Staged PCI to the RCA.   Past Surgical History:  Procedure Laterality Date   CARDIAC CATHETERIZATION  ~2011   [facility]: re-look Cath - patent stents   CORONARY STENT INTERVENTION N/A 01/27/2017   Procedure: Coronary Stent Intervention;  Surgeon: [name], MD;  Location: [place] [facility]: mLAD (just after  D1) overlapping prior DES -> SYNERGY DES 3X12 drug eluting stent   ELECTROPHYSIOLOGIC STUDY  2014   Unable to stimulate VT seen on loop recorder   HOLTER MONITOR  12/2018    (Off of mexiletine, only on diltiazem) heart rate ranged from 47 to 114 bpm.  Rare PACs and PVCs.  Sinus rhythm noted no A. fib or other arrhythmia.   implanted loop heart monitor x2  ~2011; 2014   Per report- batter out of date; apparently captured a prolonged episode of wide complex tachycardia associated with an episode of weakness/near syncope   INTRAVASCULAR PRESSURE WIRE/FFR STUDY N/A 01/27/2017   Procedure: Intravascular Pressure Wire/FFR Study;  Surgeon: [name], MD;  Location: [place] [facility];  Service: Cardiovascular;  Laterality: N/A;   LEFT HEART CATH AND CORONARY ANGIOGRAPHY N/A 01/27/2017   Procedure: Left Heart Cath and Coronary Angiography;  Surgeon: [name], MD;  Location: [facility] INVASIVE CV LAB: mLAD 99% stenosis pre-Stent & ISR --> PCI. Ost DI ~50%. pRCA DES ~50-60% FFR 0.86.   NM MYOVIEW LTD  07/2014   Unremarkable EKG. No evidence of ischemia or infarct. Mild anterior attenuation, cannot exclude prior infarct, but nonreversible. EF 66%.   NM MYOVIEW LTD  12/22/2016   INTERMEDIATE RISK. EF 58%. Defect 1: Medium size severe defect in the mid anterior, apical anterior and apical wall consistent  with prior anterior MI. Defect to: Large defect and moderate severity in the basal inferoseptal basal inferior and inferoseptal mid inferior wall consistent with ischemia.   NM MYOVIEW LTD  04/05/2019   EF 55 to 65%.  Small size, mild severity fixed perfusion defect in the mid-apical anterior septal wall.  Likely represents old anterior infarct.   No evidence of ischemia. LOW RISK.    PERCUTANEOUS CORONARY STENT INTERVENTION (PCI-S)  January-February 2006   a. mLAD - Taxus DES 3.0 x 20; b. (11/06/04) staged PCI to RCA Taxus DES 3.5 x 16; c. 01/27/2017: PCI to mLAD prox to old  stent - Synergy DES 3.0 x 12    TRANSTHORACIC ECHOCARDIOGRAM  10/11/2020    [facility]):  Normal LV size & function. EF 55-60%. NO RWMA. Normal filling parameters.  Mild TR however estimated moderate pulmonary hypertension with RVSP 52 mmHg.  IVC is mildly dilated.  No vegetations noted.  No significant change from last study.   TRANSTHORACIC ECHOCARDIOGRAM  05/2020   [facility]: 05/21/2020: Normal LV size and function.  Normal valves.  No effusion.; 06/04/2020: Normal LV size and function EF 60 to 65%.  Trace MR and TR.  No pulmonary hypertension.  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 10/08/2017   Other acquired hammer toe 05/27/2017   Wide-complex tachycardia - WCT ([place]) 12/09/2016   Urge incontinence 12/09/2016   Cardiac syncope 03/10/2016   Acute right-sided low back pain without sciatica 11/18/2015   BPH associated with nocturia 11/18/2015   Cardiac device in situ 11/18/2015   Gastroesophageal reflux disease without esophagitis 11/18/2015   Ingrown nail 11/18/2015   Palpitations 11/18/2015   PVD (posterior vitreous detachment), both eyes 11/18/2015   Type 2 diabetes mellitus without complication, without long-term current use of insulin ([place]) 96/29/5284   Umbilical hernia 13/24/4010   Paroxysmal VT ([place]) 09/17/2015   Personal history of other malignant neoplasm of skin 07/20/2011   Hyperlipidemia LDL goal <70 01/26/2008   History of acute anterior wall MI 01/26/2008   ESOPHAGEAL STRICTURE 12/26/2007   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   HIATAL HERNIA 12/26/2007   DIVERTICULOSIS, COLON 12/26/2007   Coronary artery disease involving native coronary artery of native heart without angina pectoris 11/03/2004    PCP: Marin Olp, MD   REFERRING PROVIDER: Marin Olp, MD    REFERRING DIAG: Diagnosis R26.89 (ICD-10-CM) - Balance disorder R29.898 (ICD-10-CM) - Weakness of both lower extremities   THERAPY DIAG:  Difficulty in walking, not elsewhere classified  Muscle weakness (generalized)  Unsteadiness on feet  Rationale for Evaluation and Treatment Rehabilitation  ONSET DATE: 06/24/2022   SUBJECTIVE:   SUBJECTIVE STATEMENT: doing HEP SLS and tandem are still hardest for me.  PERTINENT HISTORY: #balance issues/leg weakness- worse since covid in 2021 (severe illness 8 weeks in hospital and then rehab) but has had issues for years. PT was helpful in the past- refer back today to [facility] location  -also reports thigh pain with walking in big stores PAIN:  Are you having pain? NO  PRECAUTIONS: None  WEIGHT BEARING RESTRICTIONS: No  FALLS:  Has patient fallen in last 6 months? 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   OBJECTIVE:   DIAGNOSTIC FINDINGS: IMPRESSION: 1. 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 2. L1-2 and L3-4 left foraminal impingement, greater at L3-4 where the impingement is from a herniation. 3. 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  MUSCLE LENGTH: Hamstrings: Right 70 deg; Left 70 deg Thomas test: B hip flexors tight  POSTURE: rounded shoulders, forward head, left pelvic obliquity, and patient reports scoliosis, rib hump on L.  PALPATION: TTP on lower back paraspinals.  LOWER  EXTREMITY ROM:  B LE WFL, tightness in knee to chest and hip IR/ER.  LOWER EXTREMITY MMT:  MMT Right eval Left eval  Hip flexion 4- 4-  Hip extension    Hip abduction    Hip adduction    Hip internal rotation 3+ 3+  Hip external rotation 3+ 3+  Knee flexion 4- 4-  Knee extension 4- 4-  Ankle dorsiflexion 4- 4-  Ankle plantarflexion    Ankle inversion    Ankle eversion     (Blank rows = not tested)  LOWER EXTREMITY SPECIAL TESTS:  Slump test- neg  FUNCTIONAL TESTS:  5 times sit to stand: 12.06 Timed up and go (TUG): N/T Berg Balance Scale: 47 MCTSIB: Condition 1: Avg of 3 trials: 30 sec, Condition 2: Avg of 3 trials: 30 sec, Condition 3: Avg of 3 trials: 30 sec, Condition 4: Avg of 3 trials: 28 sec, and Total Score: 118/120 Patient with increased unsteadiness with eyes closed and on soft surface.  GAIT: Distance walked: 80 Assistive device utilized: None Level of assistance: Complete Independence Comments: WFL-reports limited in distance due to back pain.   TODAY'S TREATMENT                                                                                                                            DATE: 08/11/22 Nustep L 5 39mn 30# resisted gait with 6 inch step up 5 x each leg 30# resisted gait laterally with stepping over wAte bars 4 x each side Ball toss on airex- SBA with good righting reactions 6 inch alt step tap 20 x 2 sets Vector cone tap on and off airex CGA- good righting reactions 3# arms out marching fwd and back 20 feet each BOSU step up 10 x each fwd and 10 x each laterally with UE support for balance Knee ext 3 sets 10  10# HS curl 3 sets 10  20#   08/06/22 Recumbent bike L3 x 6 minutes Side stepping onto and off of airex pad x 10 each way Sit to stand from  mat while holding 2# ball in BUE out in front of trunk   x 10 Repeat with OHP, mini squats instead of STS B side stepping against G tband resistance, 15 reps to each side Heel raises, 2 x 10  reps Alternating step taps, progressing to multiple taps with each foot, then tapping with rotation, and finally, placing one foot on the step and rotating trunk, 10 reps each. Floor ladder, quick steps, then side stepping, then changing directions without warning. Occasional unsteadiness, but no LOB.  07/31/22 Nustep L 5 9mn Knee ext 2 sets 10  10# HS curl 2 sets 10  20# Resisted gait 4 ways 4 x each 20# STS on airex 10x- slightly elevated mat Foam beam side step and tandem in //bars 4 x each Red tband hip 3 way in // bars 15 x each  Education HEP for strength and balance. Hip 3 way red tband. Balance at counter marching fwd and back,side stepping, tandem and SLS  PATIENT EDUCATION:  Education details: HEP Person educated: Patient Education method: ECustomer service managerand Handout Education comprehension: verbalized understanding  HOME EXERCISE PROGRAM: See above  ASSESSMENT:  CLINICAL IMPRESSION: Progressed balance, speed of movement, strengthening, adding dynamic mvmt and surfaces- CGA with cuing but good righting reactions demonstrated. Pt states seeing MD tomorrow but looks like scan shows myeloma- pt states trying to remain positive  OBJECTIVE IMPAIRMENTS: decreased balance, decreased coordination, decreased endurance, difficulty walking, decreased ROM, decreased strength, impaired flexibility, improper body mechanics, postural dysfunction, and pain.   ACTIVITY LIMITATIONS: carrying, lifting, bending, squatting, stairs, and locomotion level  PARTICIPATION LIMITATIONS: cleaning and community activity  PERSONAL FACTORS: Age are also affecting patient's functional outcome.   REHAB POTENTIAL: Good  CLINICAL DECISION MAKING: Stable/uncomplicated  EVALUATION COMPLEXITY: Moderate   GOALS: Goals reviewed with patient? Yes  SHORT TERM GOALS: Target date: 08/21/2022  I with basic HEP Baseline: Goal status: 07/31/22 MET  LONG TERM GOALS: Target date:  10/02/2022   I with final HEP Baseline:  Goal status: INITIAL  2.  Increase BERG score to at least 50 Baseline:  Goal status: ongoing  3.  Increase BLE strength to at least 4/5 throughout Baseline:  Goal status: INITIAL  4.  Patient will report no falls/near falls x 4 weeks in a row. Baseline: Multiple episodes of unsteadiness. Goal status: INITIAL  5.  Patient will ambulate at least 400' with no LOB, no C/O pain Baseline:  Goal status: ongoing  PLAN:  PT FREQUENCY: 2x/week  PT DURATION: 10 weeks  PLANNED INTERVENTIONS: Therapeutic exercises, Therapeutic activity, Neuromuscular re-education, Balance training, Gait training, Patient/Family education, Self Care, Joint mobilization, Stair training, Dry Needling, Cryotherapy, Moist heat, Traction, Ionotophoresis 474mml Dexamethasone, and Manual therapy  PLAN FOR NEXT SESSION: progress high level balance. Check goals   AnFranki MonteTA 08/11/2022, 9:28 AM [place]one: [phone] Fax:  [phone] Details  Name: Alexander RodriguezRN: [id number] of Birth: 8/November 20, 1942eferring Provider:  HuMarin OlpMD  Encounter Date: 08/11/2022   SuMarcelina MorelDPT 08/11/2022, 9:28 AM  [place]one: [phone] Fax:  [phone]one: [phone] Fax:  [phone] Details  Name: Alexander EckhardtRN: [id number] of Birth: 8/Sep 24, 1942eferring Provider:  HuYong Channel  Brayton Mars, MD  Encounter Date: 08/11/2022   Laqueta Carina, PTA 08/11/2022, 9:28 AM  [address] [place], [place], [address] Phone: [phone]   Fax:  [phone]

## 2022-08-13 ENCOUNTER — Encounter: Payer: Self-pay | Admitting: Hematology & Oncology

## 2022-08-13 ENCOUNTER — Inpatient Hospital Stay (HOSPITAL_BASED_OUTPATIENT_CLINIC_OR_DEPARTMENT_OTHER): Payer: Medicare Other | Admitting: Hematology & Oncology

## 2022-08-13 ENCOUNTER — Inpatient Hospital Stay: Payer: Medicare Other | Attending: Hematology & Oncology

## 2022-08-13 VITALS — BP 115/66 | HR 74 | Temp 98.4°F | Resp 20 | Ht 72.0 in | Wt 201.0 lb

## 2022-08-13 DIAGNOSIS — C9000 Multiple myeloma not having achieved remission: Secondary | ICD-10-CM | POA: Insufficient documentation

## 2022-08-13 DIAGNOSIS — D472 Monoclonal gammopathy: Secondary | ICD-10-CM

## 2022-08-13 HISTORY — DX: Multiple myeloma not having achieved remission: C90.00

## 2022-08-13 LAB — CBC WITH DIFFERENTIAL (CANCER CENTER ONLY)
Abs Immature Granulocytes: 0.05 10*3/uL (ref 0.00–0.07)
Basophils Absolute: 0.1 10*3/uL (ref 0.0–0.1)
Basophils Relative: 1 %
Eosinophils Absolute: 0.3 10*3/uL (ref 0.0–0.5)
Eosinophils Relative: 4 %
HCT: 30 % — ABNORMAL LOW (ref 39.0–52.0)
Hemoglobin: 9.9 g/dL — ABNORMAL LOW (ref 13.0–17.0)
Immature Granulocytes: 1 %
Lymphocytes Relative: 37 %
Lymphs Abs: 3.3 10*3/uL (ref 0.7–4.0)
MCH: 31.7 pg (ref 26.0–34.0)
MCHC: 33 g/dL (ref 30.0–36.0)
MCV: 96.2 fL (ref 80.0–100.0)
Monocytes Absolute: 0.9 10*3/uL (ref 0.1–1.0)
Monocytes Relative: 10 %
Neutro Abs: 4.4 10*3/uL (ref 1.7–7.7)
Neutrophils Relative %: 47 %
Platelet Count: 152 10*3/uL (ref 150–400)
RBC: 3.12 MIL/uL — ABNORMAL LOW (ref 4.22–5.81)
RDW: 13.4 % (ref 11.5–15.5)
WBC Count: 8.9 10*3/uL (ref 4.0–10.5)
nRBC: 0 % (ref 0.0–0.2)

## 2022-08-13 LAB — CMP (CANCER CENTER ONLY)
ALT: 12 U/L (ref 0–44)
AST: 17 U/L (ref 15–41)
Albumin: 4.2 g/dL (ref 3.5–5.0)
Alkaline Phosphatase: 64 U/L (ref 38–126)
Anion gap: 6 (ref 5–15)
BUN: 23 mg/dL (ref 8–23)
CO2: 26 mmol/L (ref 22–32)
Calcium: 9.8 mg/dL (ref 8.9–10.3)
Chloride: 104 mmol/L (ref 98–111)
Creatinine: 1.3 mg/dL — ABNORMAL HIGH (ref 0.61–1.24)
GFR, Estimated: 55 mL/min — ABNORMAL LOW (ref >60)
Glucose, Bld: 107 mg/dL — ABNORMAL HIGH (ref 70–99)
Potassium: 4.7 mmol/L (ref 3.5–5.1)
Sodium: 136 mmol/L (ref 135–145)
Total Bilirubin: 0.5 mg/dL (ref 0.3–1.2)
Total Protein: 9.6 g/dL — ABNORMAL HIGH (ref 6.5–8.1)

## 2022-08-13 NOTE — Progress Notes (Signed)
START OFF PATHWAY REGIMEN - Multiple Myeloma and Other Plasma Cell Dyscrasias   [id number]:DaraVRd (Daratumumab SUBQ + Bortezomib SUBQ + Lenalidomide PO + Dexamethasone IV/PO) q21 Days (Consolidation Schema):   A cycle is every 21 days:     Lenalidomide      Dexamethasone      Bortezomib      Daratumumab and hyaluronidase-fihj   **Always confirm dose/schedule in your pharmacy ordering system**  Patient Characteristics: Multiple Myeloma, Newly Diagnosed, Transplant Ineligible or Refused, Standard Risk Disease Classification: Multiple Myeloma R-ISS Staging: III Therapeutic Status: Newly Diagnosed Is Patient Eligible for Transplant<= Transplant Ineligible or Refused Risk Status: Standard Risk Intent of Therapy: Non-Curative / Palliative Intent, Discussed with Patient

## 2022-08-13 NOTE — Progress Notes (Signed)
Hematology and Oncology Follow Up Visit  Alexander Armstrong [id number] 1941/01/10 81 y.o. 08/13/2022   Principle Diagnosis:  IgG Kappa myeloma --complex chromosomal abnormalities   Current Therapy:   Faspro/Velcade/Revlimid/Decadron -- start cycle #1 on 09/04/2022     Interim History:  Alexander Armstrong is back for follow-up.  This is his second office visit.  We first saw Alexander Armstrong on 07/06/2022.  At that time, it appeared that he did have myeloma.  We went ahead and did a bone marrow biopsy on Alexander Armstrong.  This was done on 08/04/2022.  The pathology report ([id number]) showed positive cell neoplasm consistent with myeloma.  He had 35% plasma cells in the bone marrow.  We did do cytogenetics which surprising, showed an incredible complex abnormality.  He has probably about 10 different chromosomal abnormalities.  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  I suppose that this could be also related to myeloma.    We did do a bone survey on Alexander Armstrong.  This was negative for any lytic bone lesions.  His myeloma serum studies showed a M spike of 2.3 g/dL.  His IgG level was 4200 mg/dL.  His serum kappa light chain was 7 mg/dL.  A 24-hour urine was also done.  This showed 27 mg/L of Kappa light chain.  There was a IgG kappa monoclonal spike in his urine.  As such, he does have myeloma.  We clearly can get Alexander Armstrong treated.  He is in very good shape.  I realize that he is 81 years old.  However, he does have a very good performance status.  He is still working.  He has had no problems with pain.  He has had no cough or shortness of breath.  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  He is eating well.  He has had no nausea or vomiting.  He has had no change in bowel or bladder habits.  Overall, I would say his performance status is probably ECOG 1.  Medications:  Current Outpatient Medications:    acetaminophen (TYLENOL) 500 MG tablet, Take 1,000 mg by mouth 3  (three) times daily as needed for moderate pain or headache., Disp: , Rfl:    Cholecalciferol (VITAMIN D3) 1000 units CAPS, Take 1,000 Units by mouth at bedtime., Disp: , Rfl:    clopidogrel (PLAVIX) 75 MG tablet, TAKE 1 TABLET(75 MG) BY MOUTH DAILY, Disp: 90 tablet, Rfl: 2   Cyanocobalamin 1000 MCG TBCR, Take by mouth daily., Disp: , Rfl:    diltiazem (DILT-XR) 120 MG 24 hr capsule, Take 1 capsule (120 mg total) by mouth daily., Disp: 90 capsule, Rfl: 1   fluticasone (FLONASE) 50 MCG/ACT nasal spray, Place 1 spray into both nostrils daily., Disp: , Rfl:    loratadine (CLARITIN) 10 MG tablet, Take 1 tablet by mouth daily., Disp: , Rfl:    pantoprazole (PROTONIX) 40 MG tablet, Take 1 tablet (40 mg total) by mouth daily., Disp: 90 tablet, Rfl: 3   rosuvastatin (CRESTOR) 20 MG tablet, Take 1 tablet (20 mg total) by mouth daily., Disp: 90 tablet, Rfl: 3   tamsulosin (FLOMAX) 0.4 MG CAPS capsule, Take 0.4 mg by mouth daily. Dr. Yong Channel willing to refill, Disp: , Rfl:    diltiazem (CARDIZEM) 60 MG tablet, Take 1 tablet (60 mg total) by mouth 4 (four) times daily. As needed for fast heart rate (Patient not taking: Reported on 08/13/2022), Disp: 60 tablet, Rfl: 1  Allergies:  Allergies  Allergen Reactions  Silver Sulfadiazine Hives and Rash   Latex Other (See Comments)    "takes the skin off"   Adhesive [Tape] Rash   Hydrocodone Rash    Past Medical History, Surgical history, Social history, and Family History were reviewed and updated.  Review of Systems: Review of Systems  Constitutional: Negative.   HENT:  Negative.    Eyes: Negative.   Respiratory: Negative.    Cardiovascular: Negative.   Gastrointestinal: Negative.   Endocrine: Negative.   Genitourinary: Negative.    Musculoskeletal: Negative.   Skin: Negative.   Neurological: Negative.   Hematological: Negative.   Psychiatric/Behavioral: Negative.      Physical Exam:  height is 6' (1.829 m) and weight is 201 lb (91.2 kg). His  oral temperature is 98.4 F (36.9 C). His blood pressure is 115/66 and his pulse is 74. His respiration is 20 and oxygen saturation is 96%.   Wt Readings from Last 3 Encounters:  08/13/22 201 lb (91.2 kg)  08/07/22 204 lb 6.4 oz (92.7 kg)  08/04/22 203 lb (92.1 kg)    Physical Exam Vitals reviewed.  HENT:     Head: Normocephalic and atraumatic.  Eyes:     Pupils: Pupils are equal, round, and reactive to light.  Cardiovascular:     Rate and Rhythm: Normal rate and regular rhythm.     Heart sounds: Normal heart sounds.  Pulmonary:     Effort: Pulmonary effort is normal.     Breath sounds: Normal breath sounds.  Abdominal:     General: Bowel sounds are normal.     Palpations: Abdomen is soft.  Musculoskeletal:        General: No tenderness or deformity. Normal range of motion.     Cervical back: Normal range of motion.  Lymphadenopathy:     Cervical: No cervical adenopathy.  Skin:    General: Skin is warm and dry.     Findings: No erythema or rash.  Neurological:     Mental Status: He is alert and oriented to person, place, and time.  Psychiatric:        Behavior: Behavior normal.        Thought Content: Thought content normal.        Judgment: Judgment normal.     Lab Results  Component Value Date   WBC 8.9 08/13/2022   HGB 9.9 (L) 08/13/2022   HCT 30.0 (L) 08/13/2022   MCV 96.2 08/13/2022   PLT 152 08/13/2022     Chemistry      Component Value Date/Time   NA 136 08/13/2022 1348   K 4.7 08/13/2022 1348   CL 104 08/13/2022 1348   CO2 26 08/13/2022 1348   BUN 23 08/13/2022 1348   CREATININE 1.30 (H) 08/13/2022 1348   CREATININE 1.33 (H) 01/21/2017 1616      Component Value Date/Time   CALCIUM 9.8 08/13/2022 1348   ALKPHOS 64 08/13/2022 1348   AST 17 08/13/2022 1348   ALT 12 08/13/2022 1348   BILITOT 0.5 08/13/2022 1348       Impression and Plan: Alexander Armstrong is a very nice 81 year old white male.  He has IgG kappa myeloma.  I think what troubles me is  his cytogenetics.  He has a very complex karyotype.  Again he probably has about 12 different abnormalities with his chromosomes in the bone marrow.  I suspect this probably will put Alexander Armstrong at high risk.  I do think he would be a good candidate for systemic therapy.  I think that we can probably go ahead and not have to give Alexander Armstrong IV therapy.  I think that he would be a good candidate for Faspro/Velcade/Revlimid/Decadron.  Again, we can avoid IV therapy on Alexander Armstrong.  I do not think he needs to have a bisphosphonate since there is no bony lesions that we can detect.  I really do not think that he should have a problems with treatment.  I would think that this would be effective in 90% of patients.  I clearly will not give Alexander Armstrong full dose Revlimid.  He will need to be on Famvir.  I think also will need to be on Singulair.  We can easily follow his myeloma blood levels.  I think this will clearly shows that he is responding.  If we do see a good response, then we probably can start to decrease the intensity of his therapy.  Again we will get started after Thanksgiving.  I think we have that flexibility which is nice.  We will see Alexander Armstrong back when he starts his treatment.  I will will make sure that he gets chemotherapy education teaching.     Volanda Napoleon, MD 11/9/20235:11 PM

## 2022-08-14 ENCOUNTER — Other Ambulatory Visit: Payer: Self-pay

## 2022-08-14 ENCOUNTER — Other Ambulatory Visit: Payer: Self-pay | Admitting: *Deleted

## 2022-08-14 ENCOUNTER — Encounter (HOSPITAL_COMMUNITY): Payer: Self-pay | Admitting: Hematology & Oncology

## 2022-08-14 ENCOUNTER — Other Ambulatory Visit (HOSPITAL_COMMUNITY): Payer: Self-pay

## 2022-08-14 ENCOUNTER — Ambulatory Visit: Payer: Medicare Other | Admitting: Physical Therapy

## 2022-08-14 ENCOUNTER — Encounter: Payer: Self-pay | Admitting: Hematology & Oncology

## 2022-08-14 ENCOUNTER — Telehealth: Payer: Self-pay | Admitting: *Deleted

## 2022-08-14 ENCOUNTER — Encounter: Payer: Self-pay | Admitting: *Deleted

## 2022-08-14 DIAGNOSIS — R262 Difficulty in walking, not elsewhere classified: Secondary | ICD-10-CM

## 2022-08-14 DIAGNOSIS — M6281 Muscle weakness (generalized): Secondary | ICD-10-CM

## 2022-08-14 DIAGNOSIS — R278 Other lack of coordination: Secondary | ICD-10-CM

## 2022-08-14 DIAGNOSIS — R2681 Unsteadiness on feet: Secondary | ICD-10-CM

## 2022-08-14 DIAGNOSIS — C9000 Multiple myeloma not having achieved remission: Secondary | ICD-10-CM

## 2022-08-14 DIAGNOSIS — R42 Dizziness and giddiness: Secondary | ICD-10-CM

## 2022-08-14 MED ORDER — LENALIDOMIDE 15 MG PO CAPS: 15.0000 mg | ORAL_CAPSULE | Freq: Every day | ORAL | 0 refills | Status: DC

## 2022-08-14 NOTE — Progress Notes (Signed)
Patient has a new diagnosis of MM and will be starting treatment in December. Referral to nutrition and social work placed per new patient protocol. Patient will also need chemo education. Scheduled with patient for 09/01/22  Oncology Nurse Navigator Documentation     08/14/2022    2:45 PM  Oncology Nurse Navigator Flowsheets  Navigator Location [facility]  Navigator Encounter Type Appt/Treatment Plan Review  Patient Visit Type MedOnc  Treatment Phase Pre-Tx/Tx Discussion  Barriers/Navigation Needs Coordination of Care  Interventions Coordination of Care;Referrals  Acuity Level 2-Minimal Needs (1-2 Barriers Identified)  Referrals Nutrition/dietician;Social Work  Coordination of Care Other  Time Spent with Patient 15

## 2022-08-14 NOTE — Therapy (Signed)
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 Ant Stemi (11/03/04) mLAD - PCI: Taxus DES 3.0 x 20; b. (11/06/04) staged RCA DES PCI: Taxus DES 3.5 x 16; c. (01/2017) High Risk Myoview (~fixed anterior defect with normal WM) @ [facility] (Dr. [name]) Synergy DES 3.0 x 12 (for 99% lesion @ prox edge of old stent   Cardiac syncope    No episode since starting flecainide. Had documented WIDE COMPLEX Tachycardia on loop recorder.   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 Unable to induce during EP study.;  Due to CAD, flecainide discontinued -> intolerant of both mexiletine and amiodarone -> Dr. Curt Bears (EP-Cards) d/c'd mexilitine & statrted low dose Diltizem XT; Holter Monitor 12/2018 - HR 47-114, rate PACs/PVCs, no Afib/flutter or SVT, VT.    Reflux gastritis    STEMI involving left anterior descending coronary artery ([place]) 11/03/2004   [facility]: Occluded LAD treated with DES. Staged PCI to the RCA.   Past Surgical History:  Procedure Laterality Date   CARDIAC CATHETERIZATION  ~2011   [facility]: re-look Cath - patent stents    CORONARY STENT INTERVENTION N/A 01/27/2017   Procedure: Coronary Stent Intervention;  Surgeon: [name], MD;  Location: [place] [facility]: mLAD (just after D1) overlapping prior DES -> SYNERGY DES 3X12 drug eluting stent   ELECTROPHYSIOLOGIC STUDY  2014   Unable to stimulate VT seen on loop recorder   HOLTER MONITOR  12/2018    (Off of mexiletine, only on diltiazem) heart rate ranged from 47 to 114 bpm.  Rare PACs and PVCs.  Sinus rhythm noted no A. fib or other arrhythmia.   implanted loop heart monitor x2  ~2011; 2014   Per report- batter out of date; apparently captured a prolonged episode of wide complex tachycardia associated with an episode of weakness/near syncope   INTRAVASCULAR PRESSURE WIRE/FFR STUDY N/A 01/27/2017   Procedure: Intravascular Pressure Wire/FFR Study;  Surgeon: [name], MD;  Location: [place] [facility];  Service: Cardiovascular;  Laterality: N/A;   LEFT HEART CATH AND CORONARY ANGIOGRAPHY N/A 01/27/2017   Procedure: Left Heart Cath and Coronary Angiography;  Surgeon: [name], MD;  Location: [facility] INVASIVE CV LAB: mLAD 99% stenosis pre-Stent & ISR --> PCI. Ost DI ~50%. pRCA DES ~50-60% FFR 0.86.   NM MYOVIEW LTD  07/2014   Unremarkable EKG. No evidence of ischemia or infarct. Mild anterior attenuation, cannot exclude prior infarct, but nonreversible. EF 66%.   NM  MYOVIEW LTD  12/22/2016   INTERMEDIATE RISK. EF 58%. Defect 1: Medium size severe defect in the mid anterior, apical anterior and apical wall consistent with prior anterior MI. Defect to: Large defect and moderate severity in the basal inferoseptal basal inferior and inferoseptal mid inferior wall consistent with ischemia.   NM MYOVIEW LTD  04/05/2019   EF 55 to 65%.  Small size, mild severity fixed perfusion defect in the mid-apical anterior septal wall.  Likely represents old anterior infarct.   No evidence of ischemia. LOW RISK.    PERCUTANEOUS CORONARY STENT INTERVENTION (PCI-S)   January-February 2006   a. mLAD - Taxus DES 3.0 x 20; b. (11/06/04) staged PCI to RCA Taxus DES 3.5 x 16; c. 01/27/2017: PCI to mLAD prox to old  stent - Synergy DES 3.0 x 12   TRANSTHORACIC ECHOCARDIOGRAM  10/11/2020    [facility]):  Normal LV size & function. EF 55-60%. NO RWMA. Normal filling parameters.  Mild TR however estimated moderate pulmonary hypertension with RVSP 52 mmHg.  IVC is mildly dilated.  No vegetations noted.  No significant change from last study.   TRANSTHORACIC ECHOCARDIOGRAM  05/2020   [facility]: 05/21/2020: Normal LV size and function.  Normal valves.  No effusion.; 06/04/2020: Normal LV size and function EF 60 to 65%.  Trace MR and TR.  No pulmonary hypertension.  Stable   UMBILICAL HERNIA REPAIR     Patient Active Problem List   Diagnosis Date Noted   Multiple myeloma ([place]) 08/13/2022   Exercise intolerance 02/20/2019   Hypotension 02/22/2018   Family history of glaucoma 10/08/2017   Keratoconjunctivitis sicca of both eyes not specified as Sjogren's 10/08/2017   Other acquired hammer toe 05/27/2017   Wide-complex tachycardia - WCT ([place]) 12/09/2016   Urge incontinence 12/09/2016   Cardiac syncope 03/10/2016   Acute right-sided low back pain without sciatica 11/18/2015   BPH associated with nocturia 11/18/2015   Cardiac device in situ 11/18/2015   Gastroesophageal reflux disease without esophagitis 11/18/2015   Ingrown nail 11/18/2015   Palpitations 11/18/2015   PVD (posterior vitreous detachment), both eyes 11/18/2015   Type 2 diabetes mellitus without complication, without long-term current use of insulin ([place]) 24/58/0998   Umbilical hernia 33/82/5053   Paroxysmal VT ([place]) 09/17/2015   Personal history of other malignant neoplasm of skin 07/20/2011   Hyperlipidemia LDL goal <70 01/26/2008   History of acute anterior wall MI 01/26/2008   ESOPHAGEAL STRICTURE 12/26/2007   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   HIATAL HERNIA 12/26/2007   DIVERTICULOSIS, COLON  12/26/2007   Coronary artery disease involving native coronary artery of native heart without angina pectoris 11/03/2004    PCP: Marin Olp, MD   REFERRING PROVIDER: Marin Olp, MD   REFERRING DIAG: Diagnosis R26.89 (ICD-10-CM) - Balance disorder R29.898 (ICD-10-CM) - Weakness of both lower extremities   THERAPY DIAG:  Difficulty in walking, not elsewhere classified  Muscle weakness (generalized)  Unsteadiness on feet  Dizziness and giddiness  Other lack of coordination  Rationale for Evaluation and Treatment Rehabilitation  ONSET DATE: 06/24/2022   SUBJECTIVE:   SUBJECTIVE STATEMENT: Patient reports he does have Mult Myeloma. The Dr feels it is not too far advanced and it is treatable.  PERTINENT HISTORY: #balance issues/leg weakness- worse since covid in 2021 (severe illness 8 weeks in hospital and then rehab) but has had issues for years. PT was helpful in the past- refer back today to [facility] location  -also reports thigh pain with  walking in [employer] PAIN:  Are you having pain? NO  PRECAUTIONS: None  WEIGHT BEARING RESTRICTIONS: No  FALLS:  Has patient fallen in last 6 months? 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   OBJECTIVE:   DIAGNOSTIC FINDINGS: IMPRESSION: 1. 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 2. L1-2 and L3-4 left foraminal impingement, greater at L3-4 where the impingement is from a herniation. 3. 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  MUSCLE LENGTH: Hamstrings: Right 70 deg; Left 70 deg Thomas test: B hip flexors tight  POSTURE: rounded shoulders, forward head, left pelvic obliquity, and patient reports scoliosis, rib hump on L.  PALPATION: TTP on lower back paraspinals.  LOWER EXTREMITY ROM:  B LE WFL, tightness in knee to chest and hip IR/ER.  LOWER EXTREMITY MMT:  MMT Right eval Left eval  Hip flexion 4- 4-  Hip extension    Hip abduction    Hip adduction    Hip internal rotation 3+ 3+  Hip external rotation 3+ 3+  Knee flexion 4- 4-  Knee extension 4- 4-  Ankle dorsiflexion 4- 4-  Ankle plantarflexion    Ankle inversion    Ankle eversion     (Blank rows = not tested)  LOWER EXTREMITY SPECIAL TESTS:  Slump test- neg  FUNCTIONAL TESTS:  5 times sit to stand: 12.06 Timed up and go (TUG): N/T Berg Balance Scale: 47 MCTSIB: Condition 1: Avg of 3 trials: 30 sec, Condition 2: Avg of 3 trials: 30 sec, Condition 3: Avg of 3 trials: 30 sec, Condition 4: Avg of 3 trials: 28 sec, and Total Score: 118/120 Patient with increased unsteadiness with eyes closed and on soft surface.  GAIT: Distance walked: 80 Assistive device utilized: None Level of assistance: Complete Independence Comments: WFL-reports limited in distance due to back pain.   TODAY'S TREATMENT                                                                                                                            DATE: 08/14/22 Bike L4 x 6 minutes Sit <> stand while standing on Airex pad and holding 2# ball. 1 x 10 with OHP, 1 x 10 with chest press Standing hip abd against Red Tband at calves, BUE support on counter 2 x 10 each leg Quick side to side steps over 3 poles on the floor, increasing speed as tolerated. 5 reps each direction Shoulder ext, rows, ER against 10# resistance, 2 x  10 reps each 4 square stepping in all directions, then changing directions unexpectedly upon therapist  command.   08/11/22 Nustep L 5 465mn 30# resisted gait with 6 inch step up 5 x each leg 30# resisted gait laterally with stepping over wAte bars 4 x each side Ball toss on airex- SBA with good righting reactions 6 inch alt step tap 20 x 2 sets Vector cone tap on and off airex CGA- good righting reactions 3# arms out marching fwd and back 20 feet each BOSU step up 10 x each fwd and 10 x each laterally with UE support for balance Knee ext 3 sets 10  10# HS curl 3 sets 10  20#   08/06/22 Recumbent bike L3 x 6 minutes Side stepping onto and off of airex pad x 10 each way Sit to stand from mat while holding 2# ball in BUE out in front of trunk   x 10 Repeat with OHP, mini squats instead of STS B side stepping against G tband resistance, 15 reps to each side Heel raises, 2 x 10 reps Alternating step taps, progressing to multiple taps with each foot, then tapping with rotation, and finally, placing one foot on the step and rotating trunk, 10 reps each. Floor ladder, quick steps, then side stepping, then changing directions without warning. Occasional unsteadiness, but no LOB.  07/31/22 Nustep L 5 567m Knee ext 2 sets 10  10# HS curl 2 sets 10  20# Resisted gait 4 ways 4 x each 20# STS on airex 10x- slightly elevated mat Foam beam side step and tandem in //bars 4 x each Red tband hip 3 way in // bars 15 x each  Education HEP for strength and balance. Hip 3 way red tband. Balance at counter marching fwd and back,side stepping, tandem and SLS  PATIENT EDUCATION:  Education details: HEP Person educated: Patient Education method: ExCustomer service managernd Handout Education comprehension: verbalized understanding  HOME EXERCISE PROGRAM: See above  ASSESSMENT:  CLINICAL IMPRESSION: Patient reports that he is positive for myeloma, but is ready to fight it. Treatment focused on strength and balance as he reports he still feels somewhat unsteady at times.  OBJECTIVE  IMPAIRMENTS: decreased balance, decreased coordination, decreased endurance, difficulty walking, decreased ROM, decreased strength, impaired flexibility, improper body mechanics, postural dysfunction, and pain.   ACTIVITY LIMITATIONS: carrying, lifting, bending, squatting, stairs, and locomotion level  PARTICIPATION LIMITATIONS: cleaning and community activity  PERSONAL FACTORS: Age are also affecting patient's functional outcome.   REHAB POTENTIAL: Good  CLINICAL DECISION MAKING: Stable/uncomplicated  EVALUATION COMPLEXITY: Moderate   GOALS: Goals reviewed with patient? Yes  SHORT TERM GOALS: Target date: 08/21/2022  I with basic HEP Baseline: Goal status: 07/31/22 MET  LONG TERM GOALS: Target date: 10/02/2022   I with final HEP Baseline:  Goal status: INITIAL  2.  Increase BERG score to at least 50 Baseline:  Goal status: ongoing  3.  Increase BLE strength to at least 4/5 throughout Baseline:  Goal status: ongoing  4.  Patient will report no falls/near falls x 4 weeks in a row. Baseline: Multiple episodes of unsteadiness. Goal status: INITIAL  5.  Patient will ambulate at least 400' with no LOB, no C/O pain Baseline:  Goal status: ongoing  PLAN:  PT FREQUENCY: 2x/week  PT DURATION: 10 weeks  PLANNED INTERVENTIONS: Therapeutic exercises, Therapeutic activity, Neuromuscular re-education, Balance training, Gait training, Patient/Family education, Self Care, Joint mobilization, Stair training, Dry Needling, Cryotherapy, Moist heat, Traction, Ionotophoresis 65m46ml Dexamethasone, and  Manual therapy  PLAN FOR NEXT SESSION: progress high level balance. Balance with turning. Narrow BOS.  Patient Details  Name: Alexander Armstrong MRN: [id number] Date of Birth: [birth year] Referring Provider:  Marin Olp, MD  Encounter Date: 08/14/2022   Marcelina Morel, DPT 08/14/2022, 10:12 AM  [facility] [place], [place], [address] Phone: [phone]   Fax:  [phone]

## 2022-08-14 NOTE — Telephone Encounter (Signed)
This nurse called and spoke to patient regarding Revlimid. I reviewed the Patient - Physician Agreement Form for Revlimid and the medication guide line. He verbalized understanding. Also, I faxed a copy of his paperwork to his office fax # [phone] for his records. He is aware of date and time of his next appointments, including chemotherapy education. I told him the nurse educator will review Revlimid at that time if you have any questions or concerns. I told him NOT to start Revlimid when he receives it in the mail. I need to verify the start date with Dr. Marin Olp. He is aware that a nutrition and social work consult have been ordered. All new starts get these consults. All questions answered at this time. I told him to use MyChart or call the office if he had any questions or concerns. He verbalized understanding.

## 2022-08-17 ENCOUNTER — Encounter: Payer: Self-pay | Admitting: Hematology & Oncology

## 2022-08-17 ENCOUNTER — Telehealth: Payer: Self-pay

## 2022-08-17 ENCOUNTER — Telehealth: Payer: Self-pay | Admitting: Pharmacist

## 2022-08-17 ENCOUNTER — Other Ambulatory Visit (HOSPITAL_COMMUNITY): Payer: Self-pay

## 2022-08-17 NOTE — Telephone Encounter (Signed)
Oral Oncology Patient Advocate Encounter   Received notification that prior authorization for Revlimid is required.   PA submitted on 11.13.23  Key [id number]  Status is pending     Alexander Armstrong, [name] [facility] [place]  [phone] (phone) [phone] (fax) 08/17/2022 9:03 AM

## 2022-08-17 NOTE — Telephone Encounter (Addendum)
Oral Oncology Patient Advocate Encounter  Was successful in securing patient a $12,000 grant from [facility] to provide copayment coverage for Revlimid.  This will keep the out of pocket expense at $0.     [employer] ID: [id number]   The billing information is as follows and has been shared with [facility].    RxBin: [id number] PCN: [id number] Member ID: [id number] Group ID: [id number] Dates of Eligibility: 07/18/22 through 07/18/2023  Fund:  Multiple Myeloma - Medicare [place], [facility] Patient [place]  [phone] (phone) [phone] (fax) 08/17/2022 10:02 AM

## 2022-08-17 NOTE — Telephone Encounter (Addendum)
Oral Oncology Pharmacist Encounter  Received new prescription for Revlimid (lenalidomide) for the treatment of multiple myeloma in conjunction with daratumumab, bortezomib and dexamethasone, planned duration until disease progression or unacceptable drug toxicity.  CBC w/ Diff and CMP from 08/13/22 assessed, noted pt with Scr of 1.3 mg/dL (CrCl ~57.5 mL/min) - dose has been reduced to 15 mg/d per Dr. Marin Olp. Prescription dose and frequency assessed - patient doing 28 day cycle per MD.  Current medication list in Epic reviewed, no relevant/significant DDIs with Revlimid identified.  Evaluated chart and no patient barriers to medication adherence noted.   Prescription has been e-scribed to [facility] for dispensing.   Oral Oncology Clinic will continue to follow for insurance authorization, copayment issues, initial counseling and start date.  Leron Croak, PharmD, BCPS, [facility] Hematology/Oncology Clinical Pharmacist [facility] and [place] [phone] 08/17/2022 9:23 AM

## 2022-08-17 NOTE — Telephone Encounter (Signed)
Oral Oncology Patient Advocate Encounter  Prior Authorization for Revlimid has been approved.    PA# [id number]  Effective dates: 11.10.23 until further notice  Patients co-pay is $3953.94.    Berdine Addison, [facility] Patient [place]  [phone] (phone) [phone] (fax) 08/17/2022 9:50 AM

## 2022-08-18 ENCOUNTER — Ambulatory Visit: Payer: Medicare Other | Admitting: Physical Therapy

## 2022-08-18 DIAGNOSIS — R262 Difficulty in walking, not elsewhere classified: Secondary | ICD-10-CM

## 2022-08-18 DIAGNOSIS — R2681 Unsteadiness on feet: Secondary | ICD-10-CM

## 2022-08-18 DIAGNOSIS — M6281 Muscle weakness (generalized): Secondary | ICD-10-CM

## 2022-08-18 NOTE — Telephone Encounter (Signed)
Oral Chemotherapy Pharmacist Encounter   Spoke with patient today to follow up regarding patient's oral chemotherapy medication: Revlimid (lenalidomide)  Revlimid was delivered to patient's home on 08/18/22. Patient is aware that he is to not start the Revlimid until he starts the rest of his treatment in early December. Patient expressed understanding.  I will call the patient closer to time for formal Revlimid education. Patient knows to call the office with questions or concerns.  Leron Croak, PharmD, BCPS, [facility] Hematology/Oncology Clinical Pharmacist [facility] [place] [phone] 08/18/2022 12:06 PM

## 2022-08-18 NOTE — Therapy (Signed)
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 Ant Stemi (11/03/04) mLAD - PCI: Taxus DES 3.0 x 20; b. (11/06/04) staged RCA DES PCI: Taxus DES 3.5 x 16; c. (01/2017) High Risk Myoview (~fixed anterior defect with normal WM) @ [facility] (Dr. [name]) Synergy DES 3.0 x 12 (for 99% lesion @ prox edge of old stent   Cardiac syncope    No episode since starting flecainide. Had documented WIDE COMPLEX Tachycardia on loop recorder.   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 Unable to induce during EP study.;  Due to CAD, flecainide discontinued -> intolerant of both mexiletine and amiodarone -> Dr. Curt Bears (EP-Cards) d/c'd mexilitine & statrted low dose Diltizem XT; Holter Monitor 12/2018 - HR 47-114, rate PACs/PVCs, no Afib/flutter or SVT, VT.    Reflux gastritis    STEMI involving left anterior descending coronary artery ([place]) 11/03/2004   [facility]: Occluded LAD treated with DES. Staged PCI to the RCA.   Past Surgical History:  Procedure Laterality Date   CARDIAC CATHETERIZATION  ~2011   [facility]: re-look Cath - patent stents   CORONARY STENT INTERVENTION N/A 01/27/2017   Procedure: Coronary Stent Intervention;  Surgeon: [name], MD;   Location: [facility]: mLAD (just after D1) overlapping prior DES -> SYNERGY DES 3X12 drug eluting stent   ELECTROPHYSIOLOGIC STUDY  2014   Unable to stimulate VT seen on loop recorder   HOLTER MONITOR  12/2018    (Off of mexiletine, only on diltiazem) heart rate ranged from 47 to 114 bpm.  Rare PACs and PVCs.  Sinus rhythm noted no A. fib or other arrhythmia.   implanted loop heart monitor x2  ~2011; 2014   Per report- batter out of date; apparently captured a prolonged episode of wide complex tachycardia associated with an episode of weakness/near syncope   INTRAVASCULAR PRESSURE WIRE/FFR STUDY N/A 01/27/2017   Procedure: Intravascular Pressure Wire/FFR Study;  Surgeon: [name], MD;  Location: [facility];  Service: Cardiovascular;  Laterality: N/A;   LEFT HEART CATH AND CORONARY ANGIOGRAPHY N/A 01/27/2017   Procedure: Left Heart Cath and Coronary Angiography;  Surgeon: [name], MD;  Location: [facility] INVASIVE [facility] LAB: mLAD 99% stenosis pre-Stent & ISR --> PCI. Ost DI ~50%. pRCA DES ~50-60% FFR 0.86.   NM MYOVIEW LTD  07/2014   Unremarkable EKG. No evidence of ischemia or infarct. Mild anterior attenuation, cannot exclude prior infarct, but nonreversible. EF 66%.   NM MYOVIEW LTD  12/22/2016   INTERMEDIATE RISK. EF 58%. Defect 1: Medium size severe defect in the mid  anterior, apical anterior and apical wall consistent with prior anterior MI. Defect to: Large defect and moderate severity in the basal inferoseptal basal inferior and inferoseptal mid inferior wall consistent with ischemia.   NM MYOVIEW LTD  04/05/2019   EF 55 to 65%.  Small size, mild severity fixed perfusion defect in the mid-apical anterior septal wall.  Likely represents old anterior infarct.   No evidence of ischemia. LOW RISK.    PERCUTANEOUS CORONARY STENT INTERVENTION (PCI-S)  January-February 2006   a. mLAD - Taxus DES 3.0 x 20; b. (11/06/04) staged PCI to RCA Taxus DES 3.5 x 16; c. 01/27/2017: PCI to mLAD  prox to old  stent - Synergy DES 3.0 x 12   TRANSTHORACIC ECHOCARDIOGRAM  10/11/2020    [facility]):  Normal LV size & function. EF 55-60%. NO RWMA. Normal filling parameters.  Mild TR however estimated moderate pulmonary hypertension with RVSP 52 mmHg.  IVC is mildly dilated.  No vegetations noted.  No significant change from last study.   TRANSTHORACIC ECHOCARDIOGRAM  05/2020   [facility]: 05/21/2020: Normal LV size and function.  Normal valves.  No effusion.; 06/04/2020: Normal LV size and function EF 60 to 65%.  Trace MR and TR.  No pulmonary hypertension.  Stable   UMBILICAL HERNIA REPAIR     Patient Active Problem List   Diagnosis Date Noted   Multiple myeloma ([place]) 08/13/2022   Exercise intolerance 02/20/2019   Hypotension 02/22/2018   Family history of glaucoma 10/08/2017   Keratoconjunctivitis sicca of both eyes not specified as Sjogren's 10/08/2017   Other acquired hammer toe 05/27/2017   Wide-complex tachycardia - WCT ([place]) 12/09/2016   Urge incontinence 12/09/2016   Cardiac syncope 03/10/2016   Acute right-sided low back pain without sciatica 11/18/2015   BPH associated with nocturia 11/18/2015   Cardiac device in situ 11/18/2015   Gastroesophageal reflux disease without esophagitis 11/18/2015   Ingrown nail 11/18/2015   Palpitations 11/18/2015   PVD (posterior vitreous detachment), both eyes 11/18/2015   Type 2 diabetes mellitus without complication, without long-term current use of insulin ([place]) 36/14/4315   Umbilical hernia 40/05/6760   Paroxysmal VT ([place]) 09/17/2015   Personal history of other malignant neoplasm of skin 07/20/2011   Hyperlipidemia LDL goal <70 01/26/2008   History of acute anterior wall MI 01/26/2008   ESOPHAGEAL STRICTURE 12/26/2007   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   HIATAL HERNIA 12/26/2007   DIVERTICULOSIS, COLON 12/26/2007   Coronary artery disease involving native coronary artery of native heart without angina pectoris 11/03/2004     PCP: Marin Olp, MD   REFERRING PROVIDER: Marin Olp, MD   REFERRING DIAG: Diagnosis R26.89 (ICD-10-CM) - Balance disorder R29.898 (ICD-10-CM) - Weakness of both lower extremities   THERAPY DIAG:  Difficulty in walking, not elsewhere classified  Muscle weakness (generalized)  Unsteadiness on feet  Rationale for Evaluation and Treatment Rehabilitation  ONSET DATE: 06/24/2022   SUBJECTIVE:   SUBJECTIVE STATEMENT: some times I feel better and then others I am not sure. First thing in the morning and if I am sitting for a while and get up that is when I am most wobbly   PERTINENT HISTORY: #balance issues/leg weakness- worse since covid in 2021 (severe illness 8 weeks in hospital and then rehab) but has had issues for years. PT was helpful in the past- refer back today to [facility] location  -also reports thigh pain with walking in big stores PAIN:  Are you having pain? NO  PRECAUTIONS: None  WEIGHT BEARING RESTRICTIONS: No  FALLS:  Has patient fallen in last 6 months? 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   OBJECTIVE:   DIAGNOSTIC FINDINGS: IMPRESSION: 1. 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 2. L1-2 and L3-4 left foraminal impingement, greater at L3-4 where the impingement is from a herniation. 3. 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  MUSCLE LENGTH: Hamstrings: Right 70 deg; Left 70 deg Thomas test: B  hip flexors tight  POSTURE: rounded shoulders, forward head, left pelvic obliquity, and patient reports scoliosis, rib hump on L.  PALPATION: TTP on lower back paraspinals.  LOWER EXTREMITY ROM:  B LE WFL, tightness in knee to chest and hip IR/ER.  LOWER EXTREMITY MMT:  MMT Right eval Left eval  Hip flexion 4- 4-  Hip extension    Hip abduction    Hip adduction    Hip internal rotation 3+ 3+  Hip external rotation 3+ 3+  Knee flexion 4- 4-  Knee extension 4- 4-  Ankle dorsiflexion 4- 4-  Ankle plantarflexion    Ankle inversion    Ankle eversion     (Blank rows = not tested)  LOWER EXTREMITY SPECIAL TESTS:  Slump test- neg  FUNCTIONAL TESTS:  5 times sit to stand: 12.06 Timed up and go (TUG): N/T Berg Balance Scale: 47 MCTSIB: Condition 1: Avg of 3 trials: 30 sec, Condition 2: Avg of 3 trials: 30 sec, Condition 3: Avg of 3 trials: 30 sec, Condition 4: Avg of 3 trials: 28 sec, and Total Score: 118/120 Patient with increased unsteadiness with eyes closed and on soft surface.  GAIT: Distance walked: 80 Assistive device utilized: None Level of assistance: Complete Independence Comments: WFL-reports limited in distance due to back pain.   TODAY'S TREATMENT                                                                                                                            DATE: 08/18/22  Bike L 5 6 min Knee ext 15# 2 sets 10 HS curl 25# 2 sets 10 Walking ball toss with head turns and direction changes-no LOB 30# resisted gait laterally with stepping over wAte bars 5 x each side- decreased control with going     eccentrically to the RT           30# resisted gait with 6 inch step up 5 x each leg  STS on airex with wt ball press 10 x           Tandem fwd and back in // bars on foam beam 5 x- cued to try and not use bars, slight instability           Side stepping on foam beam 5 x each way without UE and no LOB          Step up on BOSU fwd and laterally  10 x each leg for strength and balance ( es with focus on SLS moment- as pt struggles with this)   08/14/22 Bike L4 x 6 minutes Sit <> stand while standing on Airex pad and holding 2# ball. 1 x 10 with OHP, 1 x 10 with chest press Standing hip abd against Red Tband at calves, BUE support on counter 2 x 10 each leg Quick side to side steps over 3 poles on the floor, increasing speed as tolerated. 5 reps each direction Shoulder ext, rows, ER against 10# resistance, 2 x 10 reps each 4 square stepping in all directions, then changing directions unexpectedly upon therapist command.   08/11/22 Nustep L 5 50mn 30# resisted gait with 6 inch step up 5 x each leg 30# resisted gait laterally with stepping over wAte bars 4 x each side Ball toss on airex- SBA with good righting reactions 6 inch alt step tap 20 x 2 sets Vector cone tap on and off airex CGA- good righting reactions 3# arms out marching fwd and back 20 feet each BOSU step up 10 x each fwd and 10 x each laterally with UE support for balance Knee ext 3 sets 10  10# HS curl 3 sets 10  20#   08/06/22 Recumbent bike L3 x 6 minutes Side stepping onto and off of airex pad x 10 each way Sit to stand from mat while holding 2# ball in BUE out in front of trunk   x 10 Repeat with OHP, mini squats instead of STS B side stepping against G tband resistance, 15 reps to each side Heel raises, 2 x 10 reps Alternating step taps, progressing to multiple taps with each foot, then tapping with rotation, and finally, placing one foot on the step and rotating trunk, 10 reps each. Floor ladder, quick steps, then side stepping, then changing directions without warning. Occasional unsteadiness, but no LOB.  07/31/22 Nustep L 5 531m Knee ext 2 sets 10  10# HS curl 2 sets 10  20# Resisted gait 4 ways 4 x each 20# STS on airex 10x- slightly elevated mat Foam beam side step and tandem in //bars 4 x each Red tband hip 3 way in // bars 15 x  each  Education HEP for strength and balance. Hip 3 way red tband. Balance at counter marching fwd and back,side stepping, tandem and SLS  PATIENT EDUCATION:  Education details: HEP Person educated: Patient Education method: ExCustomer service managernd Handout Education comprehension: verbalized understanding  HOME EXERCISE PROGRAM: See above  ASSESSMENT:  CLINICAL IMPRESSION: ContinueTreatment focused on strength and balance as he reports he still feels somewhat unsteady at times.  OBJECTIVE IMPAIRMENTS: decreased balance, decreased coordination, decreased endurance, difficulty walking, decreased ROM, decreased strength, impaired flexibility, improper body mechanics, postural dysfunction, and pain.   ACTIVITY LIMITATIONS: carrying, lifting, bending, squatting, stairs, and locomotion level  PARTICIPATION LIMITATIONS: cleaning and community activity  PERSONAL FACTORS: Age are also affecting patient's functional outcome.   REHAB POTENTIAL: Good  CLINICAL DECISION MAKING: Stable/uncomplicated  EVALUATION COMPLEXITY: Moderate   GOALS: Goals reviewed with patient? Yes  SHORT TERM GOALS: Target date: 08/21/2022  I with basic HEP Baseline: Goal status: 07/31/22 MET  LONG TERM GOALS: Target date: 10/02/2022   I with final HEP Baseline:  Goal status: INITIAL  2.  Increase BERG score to at least 50 Baseline:  Goal status: ongoing  3.  Increase BLE strength to at least 4/5 throughout Baseline:  Goal status: ongoing  4.  Patient will report no falls/near falls x 4 weeks in a row. Baseline: Multiple episodes of unsteadiness. Goal status: INITIAL  5.  Patient will ambulate at least 400' with no LOB, no C/O pain Baseline:  Goal status: ongoing  PLAN:  PT FREQUENCY: 2x/week  PT DURATION: 10 weeks  PLANNED INTERVENTIONS: Therapeutic exercises, Therapeutic activity, Neuromuscular re-education, Balance training, Gait training, Patient/Family education, Self  Care, Joint mobilization, Stair training, Dry Needling, Cryotherapy, Moist heat, Traction, Ionotophoresis 44m/ml Dexamethasone, and Manual therapy  PLAN FOR NEXT SESSION: MMT and BERG next session for goal assessment  Patient Details  Name: CJaquane BoughnerMRN: [id number] of Birth: 804-08-1942Referring Provider:  HMarin Olp MD  Encounter Date: 08/18/2022   SMarcelina Morel DPT 08/18/2022, 9:31 AM  [place] [address]Phone: [phone]  Fax:  [address] [address]Phone: [phone]  Fax:  [phone] Patient Details  Name: COrla JolliffMRN: [id number] of Birth: 803-02-1942Referring Provider:  HMarin Olp MD  Encounter Date: 08/18/2022   PLaqueta Carina PTA 08/18/2022, 9:31 AM  [place] [address]Phone: [phone]  Fax:  [phone]

## 2022-08-21 ENCOUNTER — Ambulatory Visit: Payer: Medicare Other | Admitting: Physical Therapy

## 2022-08-21 DIAGNOSIS — R2681 Unsteadiness on feet: Secondary | ICD-10-CM

## 2022-08-21 DIAGNOSIS — R278 Other lack of coordination: Secondary | ICD-10-CM

## 2022-08-21 DIAGNOSIS — R262 Difficulty in walking, not elsewhere classified: Secondary | ICD-10-CM | POA: Diagnosis not present

## 2022-08-21 DIAGNOSIS — R42 Dizziness and giddiness: Secondary | ICD-10-CM

## 2022-08-21 DIAGNOSIS — M6281 Muscle weakness (generalized): Secondary | ICD-10-CM

## 2022-08-21 NOTE — Therapy (Signed)
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 Ant Stemi (11/03/04) mLAD - PCI: Taxus DES 3.0 x 20; b. (11/06/04) staged RCA DES PCI: Taxus DES 3.5 x 16; c. (01/2017) High Risk Myoview (~fixed anterior defect with normal WM) @ [facility] (Dr. [name]) Synergy DES 3.0 x 12 (for 99% lesion @ prox edge of old stent   Cardiac syncope    No episode since starting flecainide. Had documented WIDE COMPLEX Tachycardia on loop recorder.   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 Unable to induce during EP study.;  Due to CAD, flecainide discontinued -> intolerant of both mexiletine and amiodarone -> Dr. Curt Bears (EP-Cards) d/c'd mexilitine & statrted low dose Diltizem XT; Holter Monitor 12/2018 - HR 47-114, rate PACs/PVCs, no Afib/flutter or SVT, VT.    Reflux gastritis    STEMI involving left anterior descending coronary artery ([place]) 11/03/2004   [facility]: Occluded LAD treated with DES. Staged PCI to the RCA.   Past Surgical History:  Procedure Laterality Date   CARDIAC CATHETERIZATION  ~2011   [facility]: re-look Cath - patent stents    CORONARY STENT INTERVENTION N/A 01/27/2017   Procedure: Coronary Stent Intervention;  Surgeon: [name], MD;  Location: [place] CV LAB: mLAD (just after D1) overlapping prior DES -> SYNERGY DES 3X12 drug eluting stent   ELECTROPHYSIOLOGIC STUDY  2014   Unable to stimulate VT seen on loop recorder   HOLTER MONITOR  12/2018    (Off of mexiletine, only on diltiazem) heart rate ranged from 47 to 114 bpm.  Rare PACs and PVCs.  Sinus rhythm noted no A. fib or other arrhythmia.   implanted loop heart monitor x2  ~2011; 2014   Per report- batter out of date; apparently captured a prolonged episode of wide complex tachycardia associated with an episode of weakness/near syncope   INTRAVASCULAR PRESSURE WIRE/FFR STUDY N/A 01/27/2017   Procedure: Intravascular Pressure Wire/FFR Study;  Surgeon: [name], MD;  Location: [place] [facility];  Service: Cardiovascular;  Laterality: N/A;   LEFT HEART CATH AND CORONARY ANGIOGRAPHY N/A 01/27/2017   Procedure: Left Heart Cath and Coronary Angiography;  Surgeon: [name], MD;  Location: [facility] INVASIVE CV LAB: mLAD 99% stenosis pre-Stent & ISR --> PCI. Ost DI ~50%. pRCA DES ~50-60% FFR 0.86.   NM MYOVIEW LTD  07/2014   Unremarkable EKG. No evidence of ischemia or infarct. Mild anterior attenuation, cannot exclude prior infarct, but nonreversible. EF 66%.   NM  MYOVIEW LTD  12/22/2016   INTERMEDIATE RISK. EF 58%. Defect 1: Medium size severe defect in the mid anterior, apical anterior and apical wall consistent with prior anterior MI. Defect to: Large defect and moderate severity in the basal inferoseptal basal inferior and inferoseptal mid inferior wall consistent with ischemia.   NM MYOVIEW LTD  04/05/2019   EF 55 to 65%.  Small size, mild severity fixed perfusion defect in the mid-apical anterior septal wall.  Likely represents old anterior infarct.   No evidence of ischemia. LOW RISK.    PERCUTANEOUS CORONARY STENT INTERVENTION (PCI-S)   January-February 2006   a. mLAD - Taxus DES 3.0 x 20; b. (11/06/04) staged PCI to RCA Taxus DES 3.5 x 16; c. 01/27/2017: PCI to mLAD prox to old  stent - Synergy DES 3.0 x 12   TRANSTHORACIC ECHOCARDIOGRAM  10/11/2020    [facility]):  Normal LV size & function. EF 55-60%. NO RWMA. Normal filling parameters.  Mild TR however estimated moderate pulmonary hypertension with RVSP 52 mmHg.  IVC is mildly dilated.  No vegetations noted.  No significant change from last study.   TRANSTHORACIC ECHOCARDIOGRAM  05/2020   [facility]: 05/21/2020: Normal LV size and function.  Normal valves.  No effusion.; 06/04/2020: Normal LV size and function EF 60 to 65%.  Trace MR and TR.  No pulmonary hypertension.  Stable   UMBILICAL HERNIA REPAIR     Patient Active Problem List   Diagnosis Date Noted   Multiple myeloma ([place]) 08/13/2022   Exercise intolerance 02/20/2019   Hypotension 02/22/2018   Family history of glaucoma 10/08/2017   Keratoconjunctivitis sicca of both eyes not specified as Sjogren's 10/08/2017   Other acquired hammer toe 05/27/2017   Wide-complex tachycardia - WCT ([place]) 12/09/2016   Urge incontinence 12/09/2016   Cardiac syncope 03/10/2016   Acute right-sided low back pain without sciatica 11/18/2015   BPH associated with nocturia 11/18/2015   Cardiac device in situ 11/18/2015   Gastroesophageal reflux disease without esophagitis 11/18/2015   Ingrown nail 11/18/2015   Palpitations 11/18/2015   PVD (posterior vitreous detachment), both eyes 11/18/2015   Type 2 diabetes mellitus without complication, without long-term current use of insulin ([place]) 25/02/3975   Umbilical hernia 73/41/9379   Paroxysmal VT ([place]) 09/17/2015   Personal history of other malignant neoplasm of skin 07/20/2011   Hyperlipidemia LDL goal <70 01/26/2008   History of acute anterior wall MI 01/26/2008   ESOPHAGEAL STRICTURE 12/26/2007   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   HIATAL HERNIA 12/26/2007   DIVERTICULOSIS, COLON  12/26/2007   Coronary artery disease involving native coronary artery of native heart without angina pectoris 11/03/2004    PCP: Marin Olp, MD   REFERRING PROVIDER: Marin Olp, MD   REFERRING DIAG: Diagnosis R26.89 (ICD-10-CM) - Balance disorder R29.898 (ICD-10-CM) - Weakness of both lower extremities   THERAPY DIAG:  Difficulty in walking, not elsewhere classified  Muscle weakness (generalized)  Unsteadiness on feet  Other lack of coordination  Dizziness and giddiness  Rationale for Evaluation and Treatment Rehabilitation  ONSET DATE: 06/24/2022   SUBJECTIVE:   SUBJECTIVE STATEMENT: Patient reports no new issues. He starts his cancer treatments next Friday. The Dr has told him he doesn't expect too many side effects, but will know more after the treatment.   PERTINENT HISTORY: #balance issues/leg weakness- worse since covid in 2021 (severe illness 8 weeks in hospital and then rehab) but has had issues for years. PT was helpful in the past- refer  back today to [facility] location  -also reports thigh pain with walking in big stores PAIN:  Are you having pain? NO  PRECAUTIONS: None  WEIGHT BEARING RESTRICTIONS: No  FALLS:  Has patient fallen in last 6 months? 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   OBJECTIVE:   DIAGNOSTIC FINDINGS: IMPRESSION: 1. 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 2. L1-2 and L3-4 left foraminal impingement, greater at L3-4 where the impingement is from a herniation. 3. 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  MUSCLE LENGTH: Hamstrings: Right 70 deg; Left 70 deg Thomas test: B hip flexors tight  POSTURE: rounded shoulders, forward head, left pelvic obliquity, and patient reports scoliosis, rib hump on L.  PALPATION: TTP on lower back paraspinals.  LOWER EXTREMITY ROM:  B LE WFL, tightness in knee to chest and hip IR/ER.  LOWER EXTREMITY MMT:  MMT Right eval Left eval  Hip flexion 4- 4-  Hip extension    Hip abduction    Hip adduction    Hip internal rotation 3+ 3+  Hip external rotation 3+ 3+  Knee flexion 4- 4-  Knee extension 4- 4-  Ankle dorsiflexion 4- 4-  Ankle plantarflexion    Ankle inversion    Ankle eversion     (Blank rows = not tested)  LOWER EXTREMITY SPECIAL TESTS:  Slump test- neg  FUNCTIONAL TESTS:  5 times sit to stand: 12.06 Timed up and go (TUG): N/T Berg Balance Scale: 47 MCTSIB: Condition 1: Avg of 3 trials: 30 sec, Condition 2: Avg of 3 trials: 30 sec, Condition 3: Avg of 3 trials: 30 sec, Condition 4: Avg of 3 trials: 28 sec, and Total Score: 118/120 Patient with increased unsteadiness with eyes closed and on soft surface.  GAIT: Distance walked: 80 Assistive device utilized: None Level of assistance: Complete Independence Comments: WFL-reports limited in distance due to back pain.   TODAY'S TREATMENT                                                                                                                            DATE: 08/21/22 NuStep L5 x 6 minutes B side stepping on Airex pad, 5 x each direction. Alternating step taps on 6" step, progressed to multiple taps, then tapping with rotation, occasional unsteadiness noted, more in R stance B side to side stepping over poles on floor, increasing speed as tolerated, 6 times each direction Floor ladder, changing directions of steps upon therapist  command. He was steady  throughout. Sit to stand while holding 2# ball in BUE, with OHP at top of stance, 10 reps Repeat with chest press x 10 reps Bike for speed of movement-  2 x 30 sec fast RPM Very intense for him.  08/18/22 Bike L 5 6 min Knee ext 15# 2 sets 10 HS curl 25# 2 sets 10 Walking ball toss with head turns and direction changes-no LOB 30# resisted gait laterally with stepping over wAte bars 5 x each side- decreased control with going     eccentrically to the RT           30# resisted gait with 6 inch step up 5 x each leg           STS on airex with wt ball press 10 x           Tandem fwd and back in // bars on foam beam 5 x- cued to try and not use bars, slight instability           Side stepping on foam beam 5 x each way without UE and no LOB          Step up on BOSU fwd and laterally 10 x each leg for strength and balance ( es with focus on SLS moment- as pt struggles with this)   08/14/22 Bike L4 x 6 minutes Sit <> stand while standing on Airex pad and holding 2# ball. 1 x 10 with OHP, 1 x 10 with chest press Standing hip abd against Red Tband at calves, BUE support on counter 2 x 10 each leg Quick side to side steps over 3 poles on the floor, increasing speed as tolerated. 5 reps each direction Shoulder ext, rows, ER against 10# resistance, 2 x 10 reps each 4 square stepping in all directions, then changing directions unexpectedly upon therapist command.   08/11/22 Nustep L 5 60mn 30# resisted gait with 6 inch step up 5 x each leg 30# resisted gait laterally with stepping over wAte bars 4 x each side Ball toss on airex- SBA with good righting reactions 6 inch alt step tap 20 x 2 sets Vector cone tap on and off airex CGA- good righting reactions 3# arms out marching fwd and back 20 feet each BOSU step up 10 x each fwd and 10 x each laterally with UE support for balance Knee ext 3 sets 10  10# HS curl 3 sets 10  20#   08/06/22 Recumbent bike L3 x 6 minutes Side stepping onto and off  of airex pad x 10 each way Sit to stand from mat while holding 2# ball in BUE out in front of trunk   x 10 Repeat with OHP, mini squats instead of STS B side stepping against G tband resistance, 15 reps to each side Heel raises, 2 x 10 reps Alternating step taps, progressing to multiple taps with each foot, then tapping with rotation, and finally, placing one foot on the step and rotating trunk, 10 reps each. Floor ladder, quick steps, then side stepping, then changing directions without warning. Occasional unsteadiness, but no LOB.  07/31/22 Nustep L 5 551m Knee ext 2 sets 10  10# HS curl 2 sets 10  20# Resisted gait 4 ways 4 x each 20# STS on airex 10x- slightly elevated mat Foam beam side step and tandem in //bars 4 x each Red tband hip 3 way in // bars 15 x each  Education HEP for  strength and balance. Hip 3 way red tband. Balance at counter marching fwd and back,side stepping, tandem and SLS  PATIENT EDUCATION:  Education details: HEP Person educated: Patient Education method: Customer service manager and Handout Education comprehension: verbalized understanding  HOME EXERCISE PROGRAM: See above  ASSESSMENT:  CLINICAL IMPRESSION: Patient starts cancer treatment next Friday. Will take 2 week break from PT to see how he tolerates the treatment. Continued focus on balance and strength as he feels he is still somewhat unsteady.  OBJECTIVE IMPAIRMENTS: decreased balance, decreased coordination, decreased endurance, difficulty walking, decreased ROM, decreased strength, impaired flexibility, improper body mechanics, postural dysfunction, and pain.   ACTIVITY LIMITATIONS: carrying, lifting, bending, squatting, stairs, and locomotion level  PARTICIPATION LIMITATIONS: cleaning and community activity  PERSONAL FACTORS: Age are also affecting patient's functional outcome.   REHAB POTENTIAL: Good  CLINICAL DECISION MAKING: Stable/uncomplicated  EVALUATION COMPLEXITY:  Moderate   GOALS: Goals reviewed with patient? Yes  SHORT TERM GOALS: Target date: 08/21/2022  I with basic HEP Baseline: Goal status: 07/31/22 MET  LONG TERM GOALS: Target date: 10/02/2022   I with final HEP Baseline:  Goal status: INITIAL  2.  Increase BERG score to at least 50 Baseline:  Goal status: ongoing  3.  Increase BLE strength to at least 4/5 throughout Baseline:  Goal status: ongoing  4.  Patient will report no falls/near falls x 4 weeks in a row. Baseline: Multiple episodes of unsteadiness. Goal status: INITIAL  5.  Patient will ambulate at least 400' with no LOB, no C/O pain Baseline:  Goal status: ongoing  PLAN:  PT FREQUENCY: 2x/week  PT DURATION: 10 weeks  PLANNED INTERVENTIONS: Therapeutic exercises, Therapeutic activity, Neuromuscular re-education, Balance training, Gait training, Patient/Family education, Self Care, Joint mobilization, Stair training, Dry Needling, Cryotherapy, Moist heat, Traction, Ionotophoresis 55m/ml Dexamethasone, and Manual therapy  PLAN FOR NEXT SESSION: MMT and BERG next session for goal assessment  Patient Details  Name: CToris LaverdiereMRN: [id number] of Birth: 802/06/1942Referring Provider:  HMarin Olp MD  Encounter Date: 08/21/2022   SMarcelina Morel DPT 08/21/2022, 10:12 AM  [place] [address]Phone: [phone]  Fax:  [address] [address]Phone: [phone]  Fax:  [phone]

## 2022-08-30 NOTE — Progress Notes (Incomplete)
Primary Care Provider: Marin Armstrong, [place] Cardiologist: Alexander Hew, MD Electrophysiologist: Alexander [name], MD Hematologist/Oncologist: Dr. Marin Armstrong  Clinic Note: No chief complaint on file.  ===================================  ASSESSMENT/PLAN   Problem List Items Addressed This Visit   None  ===================================  HPI:    Alexander Armstrong is a 81 y.o. male with a PMH notable for CAD-PCI, h/o Paroxysmal VT with CRFs of HLD and HTN, along with long-haul COVID (prolonged hospitalization August through September 2021 followed by double lung pneumonia December 21-January 22) who presents today for annual follow-up with no major complaints..  Referred to Dr. Pearla Armstrong was last seen on August 26, 2021 for 71-monthfollow-up.  He was doing well.  No major issues to his off-and-on chest twinges but no change in baseline.  Nothing concerning.  Happy to be 1 year out from his prolonged hospitalization for COVID.  Was gradually gaining strength and energy level back but still having balance issues.  95% back to baseline.  Exertional dyspnea with overdoing it. Continued on low-dose diltiazem without beta-blocker.  (Beta-blocker previously stopped because of fatigue)  Recent Hospitalizations: ***  Reviewed  CV studies:    The following studies were reviewed today: (if available, images/films reviewed: From Epic Chart or Care Everywhere) ABIs 07/14/2022: Normal ABIs bilaterally (R ABI 1.22, L ABI 1.28).  Normal TBI's bilaterally (R TBI 0.75,, L TBI 0.97).-Suspect calcified hardening of the arteries.  Interval History:   COswaldo Armstrong  CV Review of Symptoms (Summary): {[id number]}  REVIEWED OF SYSTEMS   ROS Mild exercise intolerance and fatigue-better since treating anemia; poor balance since COVID hospitalization   I have reviewed and (if needed) personally updated the patient's problem list, medications, allergies, past  medical and surgical history, social and family history.   PAST MEDICAL HISTORY   Past Medical History:  Diagnosis Date  . Allergy   . Arthritis   . 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 basal cell skin cancer   . CAD S/P percutaneous coronary angioplasty 11/03/2004   a. Ant Stemi (11/03/04) mLAD - PCI: Taxus DES 3.0 x 20; b. (11/06/04) staged RCA DES PCI: Taxus DES 3.5 x 16; Alexander. (01/2017) High Risk Myoview (~fixed anterior defect with normal WM) @ [facility] (Dr. HEllyn Armstrong Synergy DES 3.0 x 12 (for 99% lesion @ prox edge of old stent  . Cardiac syncope    No episode since starting flecainide. Had documented WIDE COMPLEX Tachycardia on loop recorder.  . Cataract bilateral cataracts  . Diverticulosis   . Esophageal stricture   . GERD (gastroesophageal reflux disease)   . Hiatal hernia 11/05/2010  . HLD (hyperlipidemia)   . Nonsustained paroxysmal ventricular tachycardia ([facility]   Initially controlled with flecainide. Unable to induce during EP study.;  Due to CAD, flecainide discontinued -> intolerant of both mexiletine and amiodarone -> Dr. CCurt Armstrong(EP-Cards) d/Alexander'd mexilitine & statrted low dose Diltizem XT; Holter Monitor 12/2018 - HR 47-114, rate PACs/PVCs, no Afib/flutter or SVT, VT.   .Alexander Armstrong KitchenReflux gastritis   . STEMI involving left anterior descending coronary artery ([facility] 11/03/2004   [facility]: Occluded LAD treated with DES. Staged PCI to the RCA.    PAST SURGICAL HISTORY   Past Surgical History:  Procedure Laterality Date  . CARDIAC CATHETERIZATION  ~2011   [facility]: re-look Cath - patent stents  . CORONARY STENT INTERVENTION N/A 01/27/2017   Procedure: Coronary Stent Intervention;  Surgeon: Alexander Man MD;  [facility]: mLAD (just after D1) overlapping prior DES -> SYNERGY DES 3X12 drug eluting stent  . ELECTROPHYSIOLOGIC STUDY  2014   Unable to stimulate VT seen on loop recorder  . HOLTER MONITOR  12/2018    (Off of mexiletine, only on diltiazem)  heart rate ranged from 47 to 114 bpm.  Rare PACs and PVCs.  Sinus rhythm noted no A. fib or other arrhythmia.  Alexander Armstrong Kitchen implanted loop heart monitor x2  ~2011; 2014   Per report- batter out of date; apparently captured a prolonged episode of wide complex tachycardia associated with an episode of weakness/near syncope  . INTRAVASCULAR PRESSURE WIRE/FFR STUDY N/A 01/27/2017   Procedure: Intravascular Pressure Wire/FFR Study;  Surgeon: Alexander Man, MD;  Location: [place] [facility] CV LAB;  Service: Cardiovascular;  Laterality: N/A;  . LEFT HEART CATH AND CORONARY ANGIOGRAPHY N/A 01/27/2017   Procedure: Left Heart Cath and Coronary Angiography;  Surgeon: Alexander Man, MD;  Location: [facility] INVASIVE CV LAB: mLAD 99% stenosis pre-Stent & ISR --> PCI. Ost DI ~50%. pRCA DES ~50-60% FFR 0.86.  Alexander Armstrong Kitchen NM MYOVIEW LTD  07/2014   Unremarkable EKG. No evidence of ischemia or infarct. Mild anterior attenuation, cannot exclude prior infarct, but nonreversible. EF 66%.  Alexander Armstrong Kitchen NM MYOVIEW LTD  12/22/2016   INTERMEDIATE RISK. EF 58%. Defect 1: Medium size severe defect in the mid anterior, apical anterior and apical wall consistent with prior anterior MI. Defect to: Large defect and moderate severity in the basal inferoseptal basal inferior and inferoseptal mid inferior wall consistent with ischemia.  Alexander Armstrong Kitchen NM MYOVIEW LTD  04/05/2019   EF 55 to 65%.  Small size, mild severity fixed perfusion defect in the mid-apical anterior septal wall.  Likely represents old anterior infarct.   No evidence of ischemia. LOW RISK.   Alexander Armstrong Kitchen PERCUTANEOUS CORONARY STENT INTERVENTION (PCI-S)  January-February 2006   a. mLAD - Taxus DES 3.0 x 20; b. (11/06/04) staged PCI to RCA Taxus DES 3.5 x 16; Alexander. 01/27/2017: PCI to mLAD prox to old  stent - Synergy DES 3.0 x 12  . TRANSTHORACIC ECHOCARDIOGRAM  10/11/2020    [facility]):  Normal LV size & function. EF 55-60%. NO RWMA. Normal filling parameters.  Mild TR however estimated moderate pulmonary hypertension with RVSP 52 mmHg.   IVC is mildly dilated.  No vegetations noted.  No significant change from last study.  Alexander Dimes ECHOCARDIOGRAM  05/2020   [facility]: 05/21/2020: Normal LV size and function.  Normal valves.  No effusion.; 06/04/2020: Normal LV size and function EF 60 to 65%.  Trace MR and TR.  No pulmonary hypertension.  Stable  . UMBILICAL HERNIA REPAIR     Most Recent Cath -> (01/2017).  @ [facility] (Dr. Ellyn Armstrong) CATH-PCI LAD: Prox RCA 55% ISR (FFR 0.86), 50% D1 @ bifurcation. Synergy DES 3.0 x 12 (for 99% mLAD lesion @ prox edge of old stent) - overlaps old stent with PTCA of old stent ISR>              Immunization History  Administered Date(s) Administered  . Influenza, High Dose Seasonal PF 08/03/2018  . Influenza-Unspecified 06/17/2012  . Pneumococcal Conjugate-13 03/20/2014  . Pneumococcal Polysaccharide-23 05/06/2011  . Td 12/23/2009  . Zoster, Live 02/23/2013    MEDICATIONS/ALLERGIES   Current Meds  Medication Sig  . acetaminophen (TYLENOL) 500 MG tablet Take 1,000 mg by mouth 3 (three) times daily as needed for moderate pain or headache.  . Cholecalciferol (VITAMIN D3) 1000 units  CAPS Take 1,000 Units by mouth at bedtime.  . clopidogrel (PLAVIX) 75 MG tablet TAKE 1 TABLET(75 MG) BY MOUTH DAILY  . diltiazem (CARDIZEM) 60 MG tablet Take 1 tablet (60 mg total) by mouth 4 (four) times daily. As needed for fast heart rate  . diltiazem (DILT-XR) 120 MG 24 hr capsule Take 1 capsule (120 mg total) by mouth daily.  . pantoprazole (PROTONIX) 40 MG tablet Take 1 tablet (40 mg total) by mouth daily.  . rosuvastatin (CRESTOR) 20 MG tablet Take 1 tablet (20 mg total) by mouth daily.  . tamsulosin (FLOMAX) 0.4 MG CAPS capsule Take 0.4 mg by mouth daily. Dr. Yong Channel willing to refill    Allergies  Allergen Reactions  . Silver Sulfadiazine Hives and Rash  . Latex Other (See Comments)    "takes the skin off"  . Adhesive [Tape] Rash  . Hydrocodone Rash    SOCIAL HISTORY/FAMILY HISTORY    Reviewed in Epic:  Pertinent findings:  Social History   Tobacco Use  . Smoking status: Never  . Smokeless tobacco: Never  Vaping Use  . Vaping Use: Never used  Substance Use Topics  . Alcohol use: No  . Drug use: No   Social History   Social History Narrative   Married 50 years in 2023. 1 child Conne in [place].  1 grandchild in [place].       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       Hobbies: work, singing in church, some guitar (owns several guitars)    [place] -PE, EKG, labs   Wt Readings from Last 3 Encounters:  08/07/22 204 lb 6.4 oz (92.7 kg)  08/04/22 203 lb (92.1 kg)  07/06/22 201 lb (91.2 kg)    Physical Exam: BP 130/60   Pulse 86   Ht 6' (1.829 m)   Wt 204 lb 6.4 oz (92.7 kg)   SpO2 98%   BMI 27.72 kg/m  Physical Exam Vitals reviewed.  Constitutional:      General: He is not in acute distress.    Appearance: Normal appearance. He is normal weight. He is not ill-appearing (Well-nourished, well-groomed.  Appears younger than stated age.) or toxic-appearing.  HENT:     Head: Normocephalic and atraumatic.  Neck:     Vascular: No carotid bruit or JVD.  Cardiovascular:     Rate and Rhythm: Normal rate and regular rhythm. No extrasystoles are present.    Chest Wall: PMI is not displaced.     Pulses: Normal pulses.     Heart sounds: Normal heart sounds, S1 normal and S2 normal. No murmur heard.    No friction rub. No gallop.  Pulmonary:     Effort: Pulmonary effort is normal. No respiratory distress.     Breath sounds: Normal breath sounds. No wheezing, rhonchi or rales.  Abdominal:     General: Abdomen is flat. Bowel sounds are normal. There is no distension.     Palpations: Abdomen is soft. There is no mass.     Tenderness: There is no abdominal tenderness. There is no guarding.     Comments: No HSM or bruits  Musculoskeletal:        General: No swelling. Normal range of motion.      Cervical back: Normal range of motion and neck supple.  Skin:    General: Skin is warm and dry.  Neurological:     General: No focal deficit present.  Mental Status: He is alert and oriented to person, place, and time. Mental status is at baseline.     Gait: Gait normal.  Psychiatric:        Mood and Affect: Mood normal.        Behavior: Behavior normal.        Thought Content: Thought content normal.        Judgment: Judgment normal.     Adult ECG Report  Rate: 83;  Rhythm: normal sinus rhythm; cannot rule out inferior Mikan age-indeterminate.  Otherwise normal axis, intervals and durations.  Narrative Interpretation: Stable  Recent Labs: Reviewed Lab Results  Component Value Date   CHOL 119 02/02/2022   HDL 34 (A) 02/02/2022   LDLCALC 66 02/02/2022   TRIG 136 02/02/2022   Lab Results  Component Value Date   CREATININE 1.30 (H) 07/06/2022   BUN 31 (H) 07/06/2022   NA 133 (L) 07/06/2022   K 3.7 07/06/2022   CL 100 07/06/2022   CO2 27 07/06/2022      Latest Ref Rng & Units 08/04/2022    7:20 AM 07/06/2022   10:21 AM 06/24/2022   10:37 AM  CBC  WBC 4.0 - 10.5 K/uL 7.1  7.7  7.6   Hemoglobin 13.0 - 17.0 g/dL 9.4  10.3  10.7   Hematocrit 39.0 - 52.0 % 29.0  31.0  31.8   Platelets 150 - 400 K/uL 135  145  145.0   -Referred to Heme-Onc-Dr. Enter  Lab Results  Component Value Date   HGBA1C 7.7 (H) 06/24/2022   Lab Results  Component Value Date   TSH 2.44 04/05/2012    ================================================== I spent a total of ***minutes with the patient spent in direct patient consultation.  Additional time spent with chart review  / charting (studies, outside notes, etc): *** min Total Time: *** min  Current medicines are reviewed at length with the patient today.  (+/- concerns) ***  Notice: This dictation was prepared with Dragon dictation along with smart phrase technology. Any transcriptional errors that result from this process are  unintentional and may not be corrected upon review.  Studies Ordered:   No orders of the defined types were placed in this encounter.  No orders of the defined types were placed in this encounter.   Patient Instructions / Medication Changes & Studies & Tests Ordered   There are no Patient Instructions on file for this visit.     Alexander Man, MD, MS Alexander Armstrong, M.D., M.S. Interventional Cardiologist  [place]  Pager # [phone] Phone # [phone] [address] [place], [place] [address]   Thank you for choosing [facility] [place]!!

## 2022-08-31 ENCOUNTER — Ambulatory Visit (INDEPENDENT_AMBULATORY_CARE_PROVIDER_SITE_OTHER): Payer: Medicare Other

## 2022-08-31 ENCOUNTER — Encounter: Payer: Self-pay | Admitting: Cardiology

## 2022-08-31 ENCOUNTER — Other Ambulatory Visit: Payer: Self-pay | Admitting: *Deleted

## 2022-08-31 VITALS — Wt 201.0 lb

## 2022-08-31 DIAGNOSIS — C9000 Multiple myeloma not having achieved remission: Secondary | ICD-10-CM

## 2022-08-31 DIAGNOSIS — Z0000 Encounter for general adult medical examination without abnormal findings: Secondary | ICD-10-CM | POA: Diagnosis not present

## 2022-08-31 MED ORDER — DEXAMETHASONE 4 MG PO TABS: ORAL_TABLET | ORAL | 5 refills | Status: DC

## 2022-08-31 MED ORDER — PROCHLORPERAZINE MALEATE 10 MG PO TABS: 10.0000 mg | ORAL_TABLET | Freq: Four times a day (QID) | ORAL | 1 refills | Status: DC | PRN

## 2022-08-31 MED ORDER — ONDANSETRON HCL 8 MG PO TABS: 8.0000 mg | ORAL_TABLET | Freq: Three times a day (TID) | ORAL | 1 refills | Status: DC | PRN

## 2022-08-31 NOTE — Assessment & Plan Note (Signed)
Blood pressure stabilized now finally.  In normal range, doing well with diltiazem.  For now we will hold off in any additional medications because of history of orthostatic hypotension.

## 2022-08-31 NOTE — Progress Notes (Signed)
I connected with  [name] on 08/31/22 by a audio enabled telemedicine application and verified that I am speaking with the correct person using two identifiers.  Patient Location: Home  Provider Location: Office/Clinic  I discussed the limitations of evaluation and management by telemedicine. The patient expressed understanding and agreed to proceed.   Subjective:   Alexander Armstrong is a 81 y.o. male who presents for Medicare Annual/Subsequent preventive examination.  Review of Systems     Cardiac Risk Factors include: advanced age (>75mn, >>55women);dyslipidemia;male gender     Objective:    Today's Vitals   08/31/22 0806  Weight: 201 lb (91.2 kg)   Body mass index is 27.26 kg/m.     08/31/2022    8:13 AM 08/13/2022    2:11 PM  Advanced Directives  Does Patient Have a Medical Advance Directive? No No  Would patient like information on creating a medical advance directive? No - Patient declined No - Patient declined    Current Medications (verified) Outpatient Encounter Medications as of 08/31/2022  Medication Sig   acetaminophen (TYLENOL) 500 MG tablet Take 1,000 mg by mouth 3 (three) times daily as needed for moderate pain or headache.   Cholecalciferol (VITAMIN D3) 1000 units CAPS Take 1,000 Units by mouth at bedtime.   clopidogrel (PLAVIX) 75 MG tablet TAKE 1 TABLET(75 MG) BY MOUTH DAILY   Cyanocobalamin 1000 MCG TBCR Take by mouth daily.   diltiazem (DILT-XR) 120 MG 24 hr capsule Take 1 capsule (120 mg total) by mouth daily.   fluticasone (FLONASE) 50 MCG/ACT nasal spray Place 1 spray into both nostrils daily.   loratadine (CLARITIN) 10 MG tablet Take 1 tablet by mouth daily.   pantoprazole (PROTONIX) 40 MG tablet Take 1 tablet (40 mg total) by mouth daily.   rosuvastatin (CRESTOR) 20 MG tablet Take 1 tablet (20 mg total) by mouth daily.   tamsulosin (FLOMAX) 0.4 MG CAPS capsule Take 0.4 mg by mouth daily. Dr. HYong Channelwilling to refill   diltiazem (CARDIZEM) 60  MG tablet Take 1 tablet (60 mg total) by mouth 4 (four) times daily. As needed for fast heart rate (Patient not taking: Reported on 08/13/2022)   lenalidomide (REVLIMID) 15 MG capsule Take 1 capsule (15 mg total) by mouth daily. CFanny Dance# [id number]    Date Obtained 08/14/2022 (Patient not taking: Reported on 08/31/2022)   No facility-administered encounter medications on file as of 08/31/2022.    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 Ant Stemi (11/03/04) mLAD - PCI: Taxus DES 3.0 x 20; b. (11/06/04) staged RCA DES PCI: Taxus DES 3.5 x 16; c. (01/2017) High Risk Myoview (~fixed anterior defect with normal WM) @ [facility] (Dr. HEllyn Hack Synergy DES 3.0 x 12 (for 99% lesion @ prox edge of old stent   Cardiac syncope    No episode since starting flecainide. Had documented WIDE COMPLEX Tachycardia on loop recorder.   Cataract bilateral cataracts   Diverticulosis    Esophageal stricture    GERD (gastroesophageal reflux disease)    Hiatal hernia 11/05/2010   HLD (hyperlipidemia)    [place]   Nonsustained paroxysmal ventricular tachycardia [place]   Initially controlled with flecainide. Unable to induce during EP study.;  Due  to CAD, flecainide discontinued -> intolerant of both mexiletine and amiodarone -> Dr. Curt Bears (EP-Cards) d/c'd mexilitine & statrted low dose Diltizem XT; Holter Monitor 12/2018 - HR 47-114, rate PACs/PVCs, no Afib/flutter or SVT, VT.    Reflux gastritis    STEMI involving left anterior descending coronary artery ([place]) 11/03/2004   [facility]: Occluded LAD treated with DES. Staged PCI to the RCA.   Past Surgical History:  Procedure Laterality Date   CARDIAC CATHETERIZATION  ~2011   [facility]:  re-look Cath - patent stents   CORONARY STENT INTERVENTION N/A 01/27/2017   Procedure: Coronary Stent Intervention;  Surgeon: [name], MD;  Location: [facility]: mLAD (just after D1) overlapping prior DES -> SYNERGY DES 3X12 drug eluting stent   ELECTROPHYSIOLOGIC STUDY  2014   Unable to stimulate VT seen on loop recorder   HOLTER MONITOR  12/2018    (Off of mexiletine, only on diltiazem) heart rate ranged from 47 to 114 bpm.  Rare PACs and PVCs.  Sinus rhythm noted no A. fib or other arrhythmia.   implanted loop heart monitor x2  ~2011; 2014   Per report- batter out of date; apparently captured a prolonged episode of wide complex tachycardia associated with an episode of weakness/near syncope   INTRAVASCULAR PRESSURE WIRE/FFR STUDY N/A 01/27/2017   Procedure: Intravascular Pressure Wire/FFR Study;  Surgeon: [name], MD;  Location: [facility];  Service: Cardiovascular;  Laterality: N/A;   LEFT HEART CATH AND CORONARY ANGIOGRAPHY N/A 01/27/2017   Procedure: Left Heart Cath and Coronary Angiography;  Surgeon: [name], MD;  Location: [facility] INVASIVE [facility] LAB: mLAD 99% stenosis pre-Stent & ISR --> PCI. Ost DI ~50%. pRCA DES ~50-60% FFR 0.86.   NM MYOVIEW LTD  07/2014   Unremarkable EKG. No evidence of ischemia or infarct. Mild anterior attenuation, cannot exclude prior infarct, but nonreversible. EF 66%.   NM MYOVIEW LTD  12/22/2016   INTERMEDIATE RISK. EF 58%. Defect 1: Medium size severe defect in the mid anterior, apical anterior and apical wall consistent with prior anterior MI. Defect to: Large defect and moderate severity in the basal inferoseptal basal inferior and inferoseptal mid inferior wall consistent with ischemia.   NM MYOVIEW LTD  04/05/2019   EF 55 to 65%.  Small size, mild severity fixed perfusion defect in the mid-apical anterior septal wall.  Likely represents old anterior infarct.   No evidence of ischemia. LOW RISK.    PERCUTANEOUS CORONARY STENT  INTERVENTION (PCI-S)  January-February 2006   a. mLAD - Taxus DES 3.0 x 20; b. (11/06/04) staged PCI to RCA Taxus DES 3.5 x 16; c. 01/27/2017: PCI to mLAD prox to old  stent - Synergy DES 3.0 x 12   TRANSTHORACIC ECHOCARDIOGRAM  10/11/2020    [facility]):  Normal LV size & function. EF 55-60%. NO RWMA. Normal filling parameters.  Mild TR however estimated moderate pulmonary hypertension with RVSP 52 mmHg.  IVC is mildly dilated.  No vegetations noted.  No significant change from last study.   TRANSTHORACIC ECHOCARDIOGRAM  05/2020   [facility]: 05/21/2020: Normal LV size and function.  Normal valves.  No effusion.; 06/04/2020: Normal LV size and function EF 60 to 65%.  Trace MR and TR.  No pulmonary hypertension.  Stable   UMBILICAL HERNIA REPAIR     Family History  Problem Relation Age of Onset   Uterine cancer Mother    Multiple myeloma Mother  Heart disease Mother    Prostate cancer Father    Cancer Father        sarcoma   Macular degeneration Father    Heart attack Brother    Glaucoma Paternal Aunt    Colon cancer Neg Hx    Esophageal cancer Neg Hx    Rectal cancer Neg Hx    Stomach cancer Neg Hx    Social History   Socioeconomic History   Marital status: Married    Spouse name: Not on file   Number of children: 1   Years of education: Not on file   Highest education level: Not on file  Occupational History   Occupation: Land: Retired  Tobacco Use   Smoking status: Never   Smokeless tobacco: Never  Vaping Use   Vaping Use: Never used  Substance and Sexual Activity   Alcohol use: No   Drug use: No   Sexual activity: Not Currently  Other Topics Concern   Not on file  Social History Narrative   Married 46 years in 2023. 1 child Conne in [place].  1 grandchild in [place].       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       Hobbies: work, singing in church, some guitar (owns several [employer])    Social Determinants of [place] Strain: [place]  (08/31/2022)   Overall Financial Resource Strain (CARDIA)    Difficulty of Paying Living Expenses: Not hard at all  Food Insecurity: No Food Insecurity (08/31/2022)   Hunger Vital Sign    Worried About Running Out of Food in the Last Year: Never true    Ran Out of Food in the Last Year: Never true  Transportation Needs: No Transportation Needs (08/31/2022)   PRAPARE - Hydrologist (Medical): No    Lack of Transportation (Non-Medical): No  Physical Activity: Sufficiently Active (08/31/2022)   Exercise Vital Sign    Days of Exercise per Week: 5 days    Minutes of Exercise per Session: 60 min  Stress: No Stress Concern Present (08/31/2022)   [place]    Feeling of Stress : Not at all  Social Connections: Moderately Integrated (08/31/2022)   Social Connection and Isolation Panel [NHANES]    Frequency of Communication with Friends and Family: More than three times a week    Frequency of Social Gatherings with Friends and Family: More than three times a week    Attends Religious Services: More than 4 times per year    Active Member of [employer] or Organizations: No    Attends Music therapist: Never    Marital Status: Married    Tobacco Counseling Counseling given: Not Answered   Clinical Intake:  Pre-visit preparation completed: Yes  Pain : No/denies pain     BMI - recorded: 27.26 Nutritional Status: BMI 25 -29 Overweight Nutritional Risks: None Diabetes: No  How often do you need to have someone help you when you read instructions, pamphlets, or other written materials from your doctor or pharmacy?: 1 - Never  Diabetic?Nutrition Risk Assessment:  Has the patient had any N/V/D within the last 2 months?  No  Does the patient have any non-healing wounds?  No  Has the patient had any unintentional  weight loss or weight gain?  No   Diabetes:  Is  the patient diabetic?  Yes  If diabetic, was a CBG obtained today?  No  Did the patient bring in their glucometer from home?  No  How often do you monitor your CBG's? N/A.   Financial Strains and Diabetes Management:  Are you having any financial strains with the device, your supplies or your medication? No .  Does the patient want to be seen by Chronic Care Management for management of their diabetes?  No  Would the patient like to be referred to a Nutritionist or for Diabetic Management?  No   Diabetic Exams:  DiABETIC EYE EXAM PT STATED COMPLETED 04/30/22 Diabetic Foot Exam: Completed 06/24/22  Interpreter Needed?: No  Information entered by :: Charlott Rakes, LPN   Activities of Daily Living    08/31/2022    8:14 AM 08/25/2022   10:57 AM  In your present state of health, do you have any difficulty performing the following activities:  Hearing? 1 0  Vision? 0 0  Difficulty concentrating or making decisions? 0 0  Walking or climbing stairs? 0 0  Dressing or bathing? 0 0  Doing errands, shopping? 0 0  Preparing Food and eating ? N N  Using the Toilet? N N  In the past six months, have you accidently leaked urine? N Y  Do you have problems with loss of bowel control? N Y  Managing your Medications? N N  Managing your Finances? N N  Housekeeping or managing your Housekeeping? N N    Patient Care Team: Marin Olp, MD as PCP - General (Family Medicine) Constance Haw, MD as PCP - Electrophysiology (Cardiology) [name], MD as PCP - Cardiology (Cardiology)  Indicate any recent Medical Services you may have received from other than [facility] providers in the past year (date may be approximate).     Assessment:   This is a routine wellness examination for [place].  Hearing/Vision screen Hearing Screening - Comments:: Pt stated slight HOH  Vision Screening - Comments:: Pt follows up with Dr Trinna Post for  annul eye exams   Dietary issues and exercise activities discussed: Current Exercise Habits: Home exercise routine, Type of exercise: walking;Other - see comments (pt still works full time), Time (Minutes): > 60, Frequency (Times/Week): 5, Weekly Exercise (Minutes/Week): 0   Goals Addressed             This Visit's Progress    Patient Stated       Remain healthy and beat this cancer        Depression Screen    08/31/2022    8:12 AM 06/24/2022    9:11 AM  PHQ 2/9 Scores  PHQ - 2 Score 0 0  PHQ- 9 Score  0    Fall Risk    08/31/2022    8:14 AM 08/25/2022   10:57 AM 06/24/2022    9:07 AM  [place] in the past year? 0 0 0  Number falls in past yr: 0  0  Injury with Fall? 0  0  Risk for fall due to : Impaired balance/gait;Impaired vision  No Fall Risks  Follow up Falls prevention discussed  Falls evaluation completed    FALL RISK PREVENTION PERTAINING TO THE HOME:  Any stairs in or around the home? Yes  If so, are there any without handrails? No  Home free of loose throw rugs in walkways, pet beds, electrical cords, etc? Yes  Adequate lighting in your home to reduce risk of falls?  Yes   ASSISTIVE DEVICES UTILIZED TO PREVENT FALLS:  Life alert? No  Use of a cane, walker or w/c? No  Grab bars in the bathroom? Yes  Shower chair or bench in shower? No  Elevated toilet seat or a handicapped toilet? No   TIMED UP AND GO:  Was the test performed? No .   Cognitive Function:        08/31/2022    8:15 AM  6CIT Screen  What Year? 0 points  What month? 0 points  What time? 0 points  Count back from 20 0 points  Months in reverse 0 points  Repeat phrase 0 points  Total Score 0 points    Immunizations Immunization History  Administered Date(s) Administered   Influenza Split 06/17/2012   Influenza, High Dose Seasonal PF 08/03/2018, 08/05/2020   Influenza-Unspecified 06/17/2012   Pneumococcal Conjugate-13 03/20/2014   Pneumococcal Polysaccharide-23  05/06/2011   Td 12/23/2009   Td (Adult),5 Lf Tetanus Toxid, Preservative Free 12/23/2009   Zoster, Live 02/23/2013    TDAP status: Due, Education has been provided regarding the importance of this vaccine. Advised may receive this vaccine at local pharmacy or Health Dept. Aware to provide a copy of the vaccination record if obtained from local pharmacy or Health Dept. Verbalized acceptance and understanding.  Flu Vaccine status: Due, Education has been provided regarding the importance of this vaccine. Advised may receive this vaccine at local pharmacy or Health Dept. Aware to provide a copy of the vaccination record if obtained from local pharmacy or Health Dept. Verbalized acceptance and understanding.  Pneumococcal vaccine status: Up to date  Covid-19 vaccine status: Declined, Education has been provided regarding the importance of this vaccine but patient still declined. Advised may receive this vaccine at local pharmacy or Health Dept.or vaccine clinic. Aware to provide a copy of the vaccination record if obtained from local pharmacy or Health Dept. Verbalized acceptance and understanding.  Qualifies for Shingles Vaccine? Yes   Zostavax completed No   Shingrix Completed?: No.    Education has been provided regarding the importance of this vaccine. Patient has been advised to call insurance company to determine out of pocket expense if they have not yet received this vaccine. Advised may also receive vaccine at local pharmacy or Health Dept. Verbalized acceptance and understanding.  Screening Tests Health Maintenance  Topic Date Due   Zoster Vaccines- Shingrix (1 of 2) Never done   INFLUENZA VACCINE  05/05/2022   COVID-19 Vaccine (1) 09/16/2022 (Originally 05/21/1946)   HEMOGLOBIN A1C  12/23/2022   Diabetic kidney evaluation - Urine ACR  02/03/2023   OPHTHALMOLOGY EXAM  05/01/2023   FOOT EXAM  06/25/2023   Diabetic kidney evaluation - GFR measurement  08/14/2023   Medicare Annual  Wellness (AWV)  09/01/2023   Pneumonia Vaccine 69+ Years old  Completed   HPV VACCINES  Aged Out    Health Maintenance  Health Maintenance Due  Topic Date Due   Zoster Vaccines- Shingrix (1 of 2) Never done   INFLUENZA VACCINE  05/05/2022    Colorectal cancer screening: No longer required.    Additional Screening:  Vision Screening: Recommended annual ophthalmology exams for early detection of glaucoma and other disorders of the eye. Is the patient up to date with their annual eye exam?  Yes  Who is the provider or what is the name of the office in which the patient attends annual eye exams? 04/30/22 per pt  If pt is not established with a provider, would  they like to be referred to a provider to establish care? No .   Dental Screening: Recommended annual dental exams for proper oral hygiene  Community Resource Referral / Chronic Care Management: CRR required this visit?  No   CCM required this visit?  No      Plan:     I have personally reviewed and noted the following in the patient's chart:   Medical and social history Use of alcohol, tobacco or illicit drugs  Current medications and supplements including opioid prescriptions. Patient is not currently taking opioid prescriptions. Functional ability and status Nutritional status Physical activity Advanced directives List of other physicians Hospitalizations, surgeries, and ER visits in previous 12 months Vitals Screenings to include cognitive, depression, and falls Referrals and appointments  In addition, I have reviewed and discussed with patient certain preventive protocols, quality metrics, and best practice recommendations. A written personalized care plan for preventive services as well as general preventive health recommendations were provided to patient.     Willette Brace, LPN   [id number]   Nurse Notes: none

## 2022-08-31 NOTE — Assessment & Plan Note (Signed)
Thankfully, no recurrent episodes of VT.  Seems to be stable now on current dose of diltiazem and has not used any additional doses of PRN short acting diltiazem.  EF remains stable.

## 2022-08-31 NOTE — Patient Instructions (Signed)
Mr. Alexander Armstrong , Thank you for taking time to come for your Medicare Wellness Visit. I appreciate your ongoing commitment to your health goals. Please review the following plan we discussed and let me know if I can assist you in the future.   These are the goals we discussed:  Goals      Patient Stated     Remain healthy and beat this cancer         This is a list of the screening recommended for you and due dates:  Health Maintenance  Topic Date Due   Zoster (Shingles) Vaccine (1 of 2) Never done   Flu Shot  05/05/2022   COVID-19 Vaccine (1) 09/16/2022*   Hemoglobin A1C  12/23/2022   Yearly kidney health urinalysis for diabetes  02/03/2023   Eye exam for diabetics  05/01/2023   Complete foot exam   06/25/2023   Yearly kidney function blood test for diabetes  08/14/2023   Medicare Annual Wellness Visit  09/01/2023   Pneumonia Vaccine  Completed   HPV Vaccine  Aged Out  *Topic was postponed. The date shown is not the original due date.    Advanced directives: Advance directive discussed with you today. Even though you declined this today please call our office should you change your mind and we can give you the proper paperwork for you to fill out.  Conditions/risks identified: remain healthy and bet this cancer   Next appointment: Follow up in one year for your annual wellness visit.   Preventive Care 2 Years and Older, Male  Preventive care refers to lifestyle choices and visits with your health care provider that can promote health and wellness. What does preventive care include? A yearly physical exam. This is also called an annual well check. Dental exams once or twice a year. Routine eye exams. Ask your health care provider how often you should have your eyes checked. Personal lifestyle choices, including: Daily care of your teeth and gums. Regular physical activity. Eating a healthy diet. Avoiding tobacco and drug use. Limiting alcohol use. Practicing safe  sex. Taking low doses of aspirin every day. Taking vitamin and mineral supplements as recommended by your health care provider. What happens during an annual well check? The services and screenings done by your health care provider during your annual well check will depend on your age, overall health, lifestyle risk factors, and family history of disease. Counseling  Your health care provider may ask you questions about your: Alcohol use. Tobacco use. Drug use. Emotional well-being. Home and relationship well-being. Sexual activity. Eating habits. History of falls. Memory and ability to understand (cognition). Work and work Statistician. Screening  You may have the following tests or measurements: Height, weight, and BMI. Blood pressure. Lipid and cholesterol levels. These may be checked every 5 years, or more frequently if you are over 77 years old. Skin check. Lung cancer screening. You may have this screening every year starting at age 29 if you have a 30-pack-year history of smoking and currently smoke or have quit within the past 15 years. Fecal occult blood test (FOBT) of the stool. You may have this test every year starting at age 4. Flexible sigmoidoscopy or colonoscopy. You may have a sigmoidoscopy every 5 years or a colonoscopy every 10 years starting at age 80. Prostate cancer screening. Recommendations will vary depending on your family history and other risks. Hepatitis C blood test. Hepatitis B blood test. Sexually transmitted disease (STD) testing. Diabetes screening. This is done  by checking your blood sugar (glucose) after you have not eaten for a while (fasting). You may have this done every 1-3 years. Abdominal aortic aneurysm (AAA) screening. You may need this if you are a current or former smoker. Osteoporosis. You may be screened starting at age 14 if you are at high risk. Talk with your health care provider about your test results, treatment options, and if  necessary, the need for more tests. Vaccines  Your health care provider may recommend certain vaccines, such as: Influenza vaccine. This is recommended every year. Tetanus, diphtheria, and acellular pertussis (Tdap, Td) vaccine. You may need a Td booster every 10 years. Zoster vaccine. You may need this after age 34. Pneumococcal 13-valent conjugate (PCV13) vaccine. One dose is recommended after age 28. Pneumococcal polysaccharide (PPSV23) vaccine. One dose is recommended after age 8. Talk to your health care provider about which screenings and vaccines you need and how often you need them. This information is not intended to replace advice given to you by your health care provider. Make sure you discuss any questions you have with your health care provider. Document Released: 10/18/2015 Document Revised: 06/10/2016 Document Reviewed: 07/23/2015 Elsevier Interactive Patient Education  2017 [place] Prevention in the Home Falls can cause injuries. They can happen to people of all ages. There are many things you can do to make your home safe and to help prevent falls. What can I do on the outside of my home? Regularly fix the edges of walkways and driveways and fix any cracks. Remove anything that might make you trip as you walk through a door, such as a raised step or threshold. Trim any bushes or trees on the path to your home. Use bright outdoor lighting. Clear any walking paths of anything that might make someone trip, such as rocks or tools. Regularly check to see if handrails are loose or broken. Make sure that both sides of any steps have handrails. Any raised decks and porches should have guardrails on the edges. Have any leaves, snow, or ice cleared regularly. Use sand or salt on walking paths during winter. Clean up any spills in your garage right away. This includes oil or grease spills. What can I do in the bathroom? Use night lights. Install grab bars by the toilet  and in the tub and shower. Do not use towel bars as grab bars. Use non-skid mats or decals in the tub or shower. If you need to sit down in the shower, use a plastic, non-slip stool. Keep the floor dry. Clean up any water that spills on the floor as soon as it happens. Remove soap buildup in the tub or shower regularly. Attach bath mats securely with double-sided non-slip rug tape. Do not have throw rugs and other things on the floor that can make you trip. What can I do in the bedroom? Use night lights. Make sure that you have a light by your bed that is easy to reach. Do not use any sheets or blankets that are too big for your bed. They should not hang down onto the floor. Have a firm chair that has side arms. You can use this for support while you get dressed. Do not have throw rugs and other things on the floor that can make you trip. What can I do in the kitchen? Clean up any spills right away. Avoid walking on wet floors. Keep items that you use a lot in easy-to-reach places. If you need to  reach something above you, use a strong step stool that has a grab bar. Keep electrical cords out of the way. Do not use floor polish or wax that makes floors slippery. If you must use wax, use non-skid floor wax. Do not have throw rugs and other things on the floor that can make you trip. What can I do with my stairs? Do not leave any items on the stairs. Make sure that there are handrails on both sides of the stairs and use them. Fix handrails that are broken or loose. Make sure that handrails are as long as the stairways. Check any carpeting to make sure that it is firmly attached to the stairs. Fix any carpet that is loose or worn. Avoid having throw rugs at the top or bottom of the stairs. If you do have throw rugs, attach them to the floor with carpet tape. Make sure that you have a light switch at the top of the stairs and the bottom of the stairs. If you do not have them, ask someone to add  them for you. What else can I do to help prevent falls? Wear shoes that: Do not have high heels. Have rubber bottoms. Are comfortable and fit you well. Are closed at the toe. Do not wear sandals. If you use a stepladder: Make sure that it is fully opened. Do not climb a closed stepladder. Make sure that both sides of the stepladder are locked into place. Ask someone to hold it for you, if possible. Clearly mark and make sure that you can see: Any grab bars or handrails. First and last steps. Where the edge of each step is. Use tools that help you move around (mobility aids) if they are needed. These include: Canes. Walkers. Scooters. Crutches. Turn on the lights when you go into a dark area. Replace any light bulbs as soon as they burn out. Set up your furniture so you have a clear path. Avoid moving your furniture around. If any of your floors are uneven, fix them. If there are any pets around you, be aware of where they are. Review your medicines with your doctor. Some medicines can make you feel dizzy. This can increase your chance of falling. Ask your doctor what other things that you can do to help prevent falls. This information is not intended to replace advice given to you by your health care provider. Make sure you discuss any questions you have with your health care provider. Document Released: 07/18/2009 Document Revised: 02/27/2016 Document Reviewed: 10/26/2014 Elsevier Interactive Patient Education  2017 [employer].

## 2022-08-31 NOTE — Assessment & Plan Note (Addendum)
Gradually improving.  Energy is better although he is now anemic.  Was evaluated with an echocardiogram and Myoview last year, stable.

## 2022-08-31 NOTE — Assessment & Plan Note (Signed)
He never really truly had angina despite having significant disease.  Last PCI of the LAD was in 2018.  Has been totally asymptomatic since then.  Had a Myoview July 2022 along with a normal echocardiogram.  We have beta-blocker because of fatigue and converted to low-dose long-acting diltiazem for blood pressure and antianginal effect (also for PVCs).  He is maintained on Plavix which would be okay to hold for procedures or surgeries.  (For bone marrow biopsy would hold a minimum of 7 days prior to procedure and likely 3 days post). 

## 2022-08-31 NOTE — Assessment & Plan Note (Signed)
Most recent labs are from May 2023.  Well-controlled with LDL of 66. Continue current dose of rosuvastatin.

## 2022-08-31 NOTE — Assessment & Plan Note (Signed)
Suspect that he may need bone marrow biopsy in the future.  Okay to hold Plavix preprocedure as noted. 

## 2022-08-31 NOTE — Assessment & Plan Note (Signed)
Most recent A1c was 7.7. With weight gain, may want to consider initiating treatment agonist because of issues with orthostatic hypotension in the past, would probably hold off on SGLT2 inhibitors.

## 2022-08-31 NOTE — Assessment & Plan Note (Signed)
No further episodes symptomatically.  Remains on diltiazem.

## 2022-09-01 ENCOUNTER — Inpatient Hospital Stay: Payer: Medicare Other

## 2022-09-01 ENCOUNTER — Encounter: Payer: Self-pay | Admitting: *Deleted

## 2022-09-01 NOTE — Progress Notes (Signed)
Pharmacist Chemotherapy Monitoring - Initial Assessment    Anticipated start date: 09/07/22   The following has been reviewed per standard work regarding the patient's treatment regimen: The patient's diagnosis, treatment plan and drug doses, and organ/hematologic function Lab orders and baseline tests specific to treatment regimen  The treatment plan start date, drug sequencing, and pre-medications Prior authorization status  Patient's documented medication list, including drug-drug interaction screen and prescriptions for anti-emetics and supportive care specific to the treatment regimen The drug concentrations, fluid compatibility, administration routes, and timing of the medications to be used The patient's access for treatment and lifetime cumulative dose history, if applicable  The patient's medication allergies and previous infusion related reactions, if applicable   Changes made to treatment plan:  treatment plan date  Follow up needed:  VZV prophylaxis?  Checking with MD   Alexander Armstrong, [place], 09/01/2022  4:08 PM

## 2022-09-01 NOTE — Progress Notes (Unsigned)
Patient in chemotherapy education class with  self.  Discussed side effects of Velcade, Darzalex, Dexamethasone  which include but are not limited to myelosuppression, decreased appetite, fatigue, fever, allergic or infusional reaction, mucositis, cardiac toxicity, cough, SOB, altered taste, nausea and vomiting, diarrhea, constipation, elevated LFTs myalgia and arthralgias, hair loss or thinning, rash, skin dryness, nail changes, peripheral neuropathy, discolored urine, delayed wound healing, mental changes (Chemo brain), increased risk of infections, weight loss.  Reviewed infusion room and office policy and procedure and phone numbers 24 hours x 7 days a week.  Reviewed when to call the office with any concerns or problems.  Scientist, clinical (histocompatibility and immunogenetics) given. Antiemetic protocol and chemotherapy schedule reviewed. Patient verbalized understanding of chemotherapy indications and possible side effects.  Teachback done.  Scheduling message sent since patient does not have appointments.

## 2022-09-01 NOTE — Telephone Encounter (Signed)
Oral Chemotherapy Pharmacist Encounter   Attempted to reach patient to provide update and offer for initial counseling on oral medication: Revlimid (lenalidomide).   No answer. Left voicemail for patient to call back for initial counseling session.  Leron Croak, PharmD, BCPS, [facility] Hematology/Oncology Clinical Pharmacist [facility] and [place] [phone] 09/01/2022 4:04 PM

## 2022-09-02 ENCOUNTER — Other Ambulatory Visit: Payer: Self-pay

## 2022-09-02 ENCOUNTER — Telehealth: Payer: Self-pay

## 2022-09-02 DIAGNOSIS — C9000 Multiple myeloma not having achieved remission: Secondary | ICD-10-CM

## 2022-09-02 MED ORDER — FAMCICLOVIR 250 MG PO TABS: 250.0000 mg | ORAL_TABLET | Freq: Every day | ORAL | 6 refills | Status: DC

## 2022-09-02 MED ORDER — MONTELUKAST SODIUM 10 MG PO TABS: 10.0000 mg | ORAL_TABLET | Freq: Every day | ORAL | 6 refills | Status: DC

## 2022-09-02 NOTE — Telephone Encounter (Signed)
  Oral Chemotherapy Pharmacist Encounter    Attempted to reach patient to provide update and offer for initial counseling on oral medication: Revlimid (lenalidomide).    No answer. Left voicemail for patient to call back for initial counseling session.  Drema Halon, PharmD Hematology/Oncology Clinical Pharmacist [facility] [phone]

## 2022-09-03 ENCOUNTER — Ambulatory Visit: Payer: Medicare Other | Admitting: Dietician

## 2022-09-03 NOTE — Progress Notes (Addendum)
First attempt to reach patient for a new patient consult. Provided my cell# on home # voice mail to return call, also sent text to mobile to call or let me know best time to reschedule nutrition consult.   Patient return message left at home.  He reports work# best to call during day as he doesn't have good cell signal.  Nutrition Assessment: Reached out to patient at home telephone number.    Reason for Assessment: New Patient Assessment   ASSESSMENT: Patient is 81 year old male who has been newly diagnosed with myeloma.  He has PMHx that includes DM2, Esophageal stricture, Barrett's Esophagus, GERD, HTN, and diverticulosis.  Has supportive wife who also controls DM2 with lifestyle, and supportive daughter who is a Marine scientist.  He denies NIS other than chronic issues of bowel regularity (alternates between diarrhea and constipation) which are not unmanageable for him. Usual PO:  Breakfast: Honey Cheerios, cup of milk Lunch: leftovers, mostly vegetables doesn't eat a lot of meat Dinner: meat of some kind, vegetables Prefers chicken to beef Snacks: peanut butter crackers sometimes baby belle cheese  4-5 cups coffee with coffee mate & Splenda, 16 iced tea w/Splenda, 1-2 bottled water    Nutrition Focused Physical Exam: unable to perform NFPE   Medications: not using any glycemic control medications   Labs: 08/13/22  Creat. 1.3, Hgb 9.9   Anthropometrics: weight has been increasing since weight loss with Covid in 2020, expressed desire to lose weight  Height: 72" Weight:  08/31/22  201# UBW: 200 BMI: 27.26    INTERVENTION:   Relayed that nutrition services are wrap around service provided at no charge and encouraged continued communication if experiencing any nutritional impact symptoms (NIS).  Educated on importance of adequate nourishment with calorie and protein energy intake  with nutrient dense foods when possible to maintain weight/strength and QOL. Cautioned again weight  loss of >5#/month to preserve LBM.   Encouraged 4oz V-8 juice daily (low sodium if tolerated) 2-3 servings dairy/day increasing iron rich foods (raisin bran a couple times a week, beans and greens)  Emailed Nutrition Tip sheet  for  Nutrition During Cancer Treatment, Iron sources Contact information provided.  MONITORING, EVALUATION, GOAL: weight trends, nutrition impact symptoms, PO intake, labs   Next Visit: PRN at patient or provider request.  April Manson, RDN, LDN Registered Dietitian, [place] (Usual office hours: Tuesday-Thursday) Mobile: [phone]

## 2022-09-03 NOTE — Telephone Encounter (Signed)
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   Counseled patient on administration, dosing, side effects, monitoring, drug-food interactions, safe handling, storage, and disposal.  Patient will take Revlimid 15mg capsules, 1 capsule by mouth once daily, without regard to food, with a full glass of water.  Revlimid will be given 21 days on, 7 days off, repeat every 28 days per MD.  Patient will take dexamethasone 4mg tablets, 5 tablets (20mg) by mouth once weekly with breakfast.  Revlimid start date: 09/07/22  Adverse effects of Revlimid include but are not limited to: nausea, constipation, diarrhea, abdominal pain, rash, fatigue, drug fever, and decreased blood counts.    Reviewed with patient importance of keeping a medication schedule and plan for any missed doses. No barriers to medication adherence identified.  Medication reconciliation performed and medication/allergy list updated.  Patient has picked up famciclovir (per MD) and dexamethasone prescriptions and will wait until cycle 1 day 1 prior to starting dexamethasone. Patient counseled on importance of daily aspirin 81mg for VTE prophylaxis.  Insurance authorization for Revlimid has been obtained.  Revlimid prescription is being dispensed from [facility] as it is a limited distribution medication.  All questions answered.  Patient voiced understanding and appreciation.   Medication education handout placed in mail for patient. Patient knows to call the office with questions or concerns. Oral Chemotherapy Clinic phone number provided to patient.   Kaitlyn Schomburg, PharmD Hematology/Oncology Clinical Pharmacist [place] [phone] 09/03/2022    9:18 AM  

## 2022-09-07 ENCOUNTER — Other Ambulatory Visit: Payer: Self-pay | Admitting: *Deleted

## 2022-09-07 ENCOUNTER — Inpatient Hospital Stay: Payer: Medicare Other

## 2022-09-07 ENCOUNTER — Inpatient Hospital Stay: Payer: Medicare Other | Attending: Hematology & Oncology

## 2022-09-07 VITALS — BP 119/64 | HR 55 | Temp 97.3°F | Resp 17 | Wt 201.0 lb

## 2022-09-07 DIAGNOSIS — Z5112 Encounter for antineoplastic immunotherapy: Secondary | ICD-10-CM | POA: Insufficient documentation

## 2022-09-07 DIAGNOSIS — K56609 Unspecified intestinal obstruction, unspecified as to partial versus complete obstruction: Secondary | ICD-10-CM | POA: Diagnosis not present

## 2022-09-07 DIAGNOSIS — E860 Dehydration: Secondary | ICD-10-CM | POA: Diagnosis not present

## 2022-09-07 DIAGNOSIS — R112 Nausea with vomiting, unspecified: Secondary | ICD-10-CM | POA: Insufficient documentation

## 2022-09-07 DIAGNOSIS — C9000 Multiple myeloma not having achieved remission: Secondary | ICD-10-CM | POA: Diagnosis present

## 2022-09-07 LAB — CMP (CANCER CENTER ONLY)
ALT: 11 U/L (ref 0–44)
AST: 16 U/L (ref 15–41)
Albumin: 4.1 g/dL (ref 3.5–5.0)
Alkaline Phosphatase: 61 U/L (ref 38–126)
Anion gap: 5 (ref 5–15)
BUN: 26 mg/dL — ABNORMAL HIGH (ref 8–23)
CO2: 26 mmol/L (ref 22–32)
Calcium: 9.4 mg/dL (ref 8.9–10.3)
Chloride: 105 mmol/L (ref 98–111)
Creatinine: 1.3 mg/dL — ABNORMAL HIGH (ref 0.61–1.24)
GFR, Estimated: 55 mL/min — ABNORMAL LOW (ref >60)
Glucose, Bld: 117 mg/dL — ABNORMAL HIGH (ref 70–99)
Potassium: 3.8 mmol/L (ref 3.5–5.1)
Sodium: 136 mmol/L (ref 135–145)
Total Bilirubin: 0.4 mg/dL (ref 0.3–1.2)
Total Protein: 9.2 g/dL — ABNORMAL HIGH (ref 6.5–8.1)

## 2022-09-07 LAB — CBC WITH DIFFERENTIAL (CANCER CENTER ONLY)
Abs Immature Granulocytes: 0.03 10*3/uL (ref 0.00–0.07)
Basophils Absolute: 0.1 10*3/uL (ref 0.0–0.1)
Basophils Relative: 1 %
Eosinophils Absolute: 0.4 10*3/uL (ref 0.0–0.5)
Eosinophils Relative: 4 %
HCT: 29.9 % — ABNORMAL LOW (ref 39.0–52.0)
Hemoglobin: 9.8 g/dL — ABNORMAL LOW (ref 13.0–17.0)
Immature Granulocytes: 0 %
Lymphocytes Relative: 35 %
Lymphs Abs: 3 10*3/uL (ref 0.7–4.0)
MCH: 31.6 pg (ref 26.0–34.0)
MCHC: 32.8 g/dL (ref 30.0–36.0)
MCV: 96.5 fL (ref 80.0–100.0)
Monocytes Absolute: 0.9 10*3/uL (ref 0.1–1.0)
Monocytes Relative: 11 %
Neutro Abs: 4.2 10*3/uL (ref 1.7–7.7)
Neutrophils Relative %: 49 %
Platelet Count: 142 10*3/uL — ABNORMAL LOW (ref 150–400)
RBC: 3.1 MIL/uL — ABNORMAL LOW (ref 4.22–5.81)
RDW: 13.5 % (ref 11.5–15.5)
WBC Count: 8.5 10*3/uL (ref 4.0–10.5)
nRBC: 0 % (ref 0.0–0.2)

## 2022-09-07 LAB — TYPE AND SCREEN
ABO/RH(D): A POS
Antibody Screen: NEGATIVE

## 2022-09-07 MED ORDER — BORTEZOMIB CHEMO SQ INJECTION 3.5 MG (2.5MG/ML)
1.3000 mg/m2 | Freq: Once | INTRAMUSCULAR | Status: AC
  Administered 2022-09-07: 2.75 mg via SUBCUTANEOUS
  Filled 2022-09-07: qty 1.1

## 2022-09-07 MED ORDER — DARATUMUMAB-HYALURONIDASE-FIHJ 1800-30000 MG-UT/15ML ~~LOC~~ SOLN
1800.0000 mg | Freq: Once | SUBCUTANEOUS | Status: AC
  Administered 2022-09-07: 1800 mg via SUBCUTANEOUS
  Filled 2022-09-07: qty 15

## 2022-09-07 MED ORDER — ACETAMINOPHEN 325 MG PO TABS
650.0000 mg | ORAL_TABLET | Freq: Once | ORAL | Status: AC
  Administered 2022-09-07: 650 mg via ORAL
  Filled 2022-09-07: qty 2

## 2022-09-07 MED ORDER — MONTELUKAST SODIUM 10 MG PO TABS
10.0000 mg | ORAL_TABLET | Freq: Once | ORAL | Status: AC
  Administered 2022-09-07: 10 mg via ORAL
  Filled 2022-09-07: qty 1

## 2022-09-07 MED ORDER — DEXAMETHASONE 4 MG PO TABS
20.0000 mg | ORAL_TABLET | Freq: Once | ORAL | Status: AC
  Administered 2022-09-07: 20 mg via ORAL
  Filled 2022-09-07: qty 5

## 2022-09-07 MED ORDER — DIPHENHYDRAMINE HCL 25 MG PO CAPS
50.0000 mg | ORAL_CAPSULE | Freq: Once | ORAL | Status: AC
  Administered 2022-09-07: 50 mg via ORAL
  Filled 2022-09-07: qty 2

## 2022-09-07 NOTE — Patient Instructions (Signed)
[facility]  Discharge Instructions: Thank you for choosing [facility] to provide your oncology and hematology care.   If you have a lab appointment with the [facility], please go directly to the [facility] and check in at the registration area.  Wear comfortable clothing and clothing appropriate for easy access to any Portacath or PICC line.   We strive to give you quality time with your provider. You may need to reschedule your appointment if you arrive late (15 or more minutes).  Arriving late affects you and other patients whose appointments are after yours.  Also, if you miss three or more appointments without notifying the office, you may be dismissed from the clinic at the provider's discretion.      For prescription refill requests, have your pharmacy contact our office and allow 72 hours for refills to be completed.    Today you received the following chemotherapy and/or immunotherapy agents Velcade and Daratumumab Faspro      To help prevent nausea and vomiting after your treatment, we encourage you to take your nausea medication as directed.  BELOW ARE SYMPTOMS THAT SHOULD BE REPORTED IMMEDIATELY: *FEVER GREATER THAN 100.4 F (38 C) OR HIGHER *CHILLS OR SWEATING *NAUSEA AND VOMITING THAT IS NOT CONTROLLED WITH YOUR NAUSEA MEDICATION *UNUSUAL SHORTNESS OF BREATH *UNUSUAL BRUISING OR BLEEDING *URINARY PROBLEMS (pain or burning when urinating, or frequent urination) *BOWEL PROBLEMS (unusual diarrhea, constipation, pain near the anus) TENDERNESS IN MOUTH AND THROAT WITH OR WITHOUT PRESENCE OF ULCERS (sore throat, sores in mouth, or a toothache) UNUSUAL RASH, SWELLING OR PAIN  UNUSUAL VAGINAL DISCHARGE OR ITCHING   Items with * indicate a potential emergency and should be followed up as soon as possible or go to the Emergency Department if any problems should occur.  Please show the CHEMOTHERAPY ALERT CARD or IMMUNOTHERAPY ALERT CARD  at check-in to the Emergency Department and triage nurse. Should you have questions after your visit or need to cancel or reschedule your appointment, please contact [facility]  [phone] and follow the prompts.  Office hours are 8:00 a.m. to 4:30 p.m. Monday - Friday. Please note that voicemails left after 4:00 p.m. may not be returned until the following business day.  We are closed weekends and major holidays. You have access to a nurse at all times for urgent questions. Please call the main number to the clinic [phone] and follow the prompts.  For any non-urgent questions, you may also contact your provider using MyChart. We now offer e-Visits for anyone 29 and older to request care online for non-urgent symptoms. For details visit [email or web address].   Also download the MyChart app! Go to the app store, search "MyChart", open the app, select [facility], and log in with your MyChart username and password.  Masks are optional in the cancer centers. If you would like for your care team to wear a mask while they are taking care of you, please let them know. You may have one support person who is at least 81 years old accompany you for your appointments.

## 2022-09-07 NOTE — Progress Notes (Signed)
[place] Work  Clinical Social Work was referred by Art therapist for assessment of psychosocial needs.  Clinical Social Worker contacted patient by phone  to offer support and assess for needs.    Patient was receiving his first infusion today.  He stated he gets all of his needs met and has a good support system.  Patient said he appreciated the call, but had no needs at this time.  He is still working.  Provided contact information.     Margaree Mackintosh, LCSW  Clinical Social Worker [facility]

## 2022-09-08 ENCOUNTER — Ambulatory Visit: Payer: Medicare Other | Admitting: Physical Therapy

## 2022-09-08 ENCOUNTER — Other Ambulatory Visit: Payer: Self-pay

## 2022-09-08 ENCOUNTER — Telehealth: Payer: Self-pay

## 2022-09-08 LAB — PRETREATMENT RBC PHENOTYPE

## 2022-09-08 MED ORDER — LENALIDOMIDE 15 MG PO CAPS: 15.0000 mg | ORAL_CAPSULE | Freq: Every day | ORAL | 0 refills | Status: DC

## 2022-09-08 NOTE — Telephone Encounter (Signed)
Patient called wanting to clarify decadron dose, states he took the '20mg'$  in office yesterday before treatment and his decadron bottle states to take '20mg'$  the day after treatment and wanted to make sure he needed both doses. Informed patient not to take today and we would clarify with MD tomorrow if he needs the dose at home as well as the day of chemo. Pt verbalized understanding and denies any other questions or concerns at this time.

## 2022-09-09 ENCOUNTER — Other Ambulatory Visit: Payer: Self-pay

## 2022-09-09 DIAGNOSIS — C9000 Multiple myeloma not having achieved remission: Secondary | ICD-10-CM

## 2022-09-09 MED ORDER — DEXAMETHASONE 4 MG PO TABS: ORAL_TABLET | ORAL | 5 refills | Status: DC

## 2022-09-09 NOTE — Telephone Encounter (Signed)
Confirmed with Dr.Ennever, patient to only take '20mg'$  dexamethasone on day of treatment. Called patient and informed him, he would like to take this at home the morning of his treatments. Pt verbalized only '20mg'$  the day of and no decadron the day after. Pt requested new refill for more decadron as well. Resent into pharmacy with new instructions, pt declines any other questions or concerns at this time.

## 2022-09-10 ENCOUNTER — Ambulatory Visit: Payer: Medicare Other | Attending: Family Medicine | Admitting: Physical Therapy

## 2022-09-10 DIAGNOSIS — M6281 Muscle weakness (generalized): Secondary | ICD-10-CM | POA: Diagnosis present

## 2022-09-10 DIAGNOSIS — R2681 Unsteadiness on feet: Secondary | ICD-10-CM | POA: Diagnosis present

## 2022-09-10 NOTE — Therapy (Signed)
OUTPATIENT PHYSICAL THERAPY LOWER EXTREMITY  PHYSICAL THERAPY DISCHARGE SUMMARY  Visits from Start of Care: 8  Current functional level related to goals / functional outcomes: Goals met   Remaining deficits: Patient has just started chemo and is at risk for regression   Education / Equipment: HEP   Patient agrees to discharge. Patient goals were met. Patient is being discharged due to a change in medical status.   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 Ant Stemi (11/03/04) mLAD - PCI: Taxus DES 3.0 x 20; b. (11/06/04) staged RCA DES PCI: Taxus DES 3.5 x 16; c. (01/2017) High Risk Myoview (~fixed anterior defect with normal WM) @ [facility] (Dr. [name]) Synergy DES 3.0 x 12 (for 99% lesion @ prox edge of old stent   Cardiac syncope    No episode since starting flecainide. Had documented WIDE COMPLEX Tachycardia on loop recorder.   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 Unable to induce during EP study.;  Due to CAD, flecainide discontinued -> intolerant of both mexiletine and amiodarone -> Dr. Curt Bears (EP-Cards) d/c'd mexilitine & statrted low dose Diltizem XT; Holter Monitor 12/2018 - HR 47-114, rate PACs/PVCs, no Afib/flutter or SVT, VT.    Reflux gastritis    STEMI involving left  anterior descending coronary artery ([place]) 11/03/2004   [facility]: Occluded LAD treated with DES. Staged PCI to the RCA.   Past Surgical History:  Procedure Laterality Date   CARDIAC CATHETERIZATION  ~2011   [facility]: re-look Cath - patent stents   CORONARY STENT INTERVENTION N/A 01/27/2017   Procedure: Coronary Stent Intervention;  Surgeon: [name], MD;  Location: [place] [facility]: mLAD (just after D1) overlapping prior DES -> SYNERGY DES 3X12 drug eluting stent   ELECTROPHYSIOLOGIC STUDY  2014   Unable to stimulate VT seen on loop recorder   HOLTER MONITOR  12/2018    (Off of mexiletine, only on diltiazem) heart rate ranged from 47 to 114 bpm.  Rare PACs and PVCs.  Sinus rhythm noted no A. fib or other arrhythmia.   implanted loop heart monitor x2  ~2011; 2014   Per report- batter out of date; apparently captured a prolonged episode of wide complex tachycardia associated with an episode of weakness/near syncope   INTRAVASCULAR PRESSURE WIRE/FFR STUDY N/A 01/27/2017   Procedure: Intravascular Pressure Wire/FFR Study;  Surgeon: [name], MD;  Location: [place] [facility];  Service: Cardiovascular;  Laterality: N/A;   LEFT HEART CATH AND CORONARY ANGIOGRAPHY N/A 01/27/2017   Procedure: Left Heart Cath and Coronary Angiography;  Surgeon: [name], MD;  Location: [place] [facility]:  mLAD 99% stenosis pre-Stent & ISR --> PCI. Ost DI ~50%. pRCA DES ~50-60% FFR 0.86.   NM MYOVIEW LTD  07/2014   Unremarkable EKG. No evidence of ischemia or infarct. Mild anterior attenuation, cannot exclude prior infarct, but nonreversible. EF 66%.   NM MYOVIEW LTD  12/22/2016   INTERMEDIATE RISK. EF 58%. Defect 1: Medium size severe defect in the mid anterior, apical anterior and apical wall consistent with prior anterior MI. Defect to: Large defect and moderate severity in the basal inferoseptal basal inferior and inferoseptal mid inferior wall consistent with ischemia.   NM MYOVIEW  LTD  04/05/2019   EF 55 to 65%.  Small size, mild severity fixed perfusion defect in the mid-apical anterior septal wall.  Likely represents old anterior infarct.   No evidence of ischemia. LOW RISK.    PERCUTANEOUS CORONARY STENT INTERVENTION (PCI-S)  January-February 2006   a. mLAD - Taxus DES 3.0 x 20; b. (11/06/04) staged PCI to RCA Taxus DES 3.5 x 16; c. 01/27/2017: PCI to mLAD prox to old  stent - Synergy DES 3.0 x 12   TRANSTHORACIC ECHOCARDIOGRAM  10/11/2020    [facility]):  Normal LV size & function. EF 55-60%. NO RWMA. Normal filling parameters.  Mild TR however estimated moderate pulmonary hypertension with RVSP 52 mmHg.  IVC is mildly dilated.  No vegetations noted.  No significant change from last study.   TRANSTHORACIC ECHOCARDIOGRAM  05/2020   [facility]: 05/21/2020: Normal LV size and function.  Normal valves.  No effusion.; 06/04/2020: Normal LV size and function EF 60 to 65%.  Trace MR and TR.  No pulmonary hypertension.  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 10/08/2017   Other acquired hammer toe 05/27/2017   Wide-complex tachycardia - WCT ([place]) 12/09/2016   Urge incontinence 12/09/2016   Cardiac syncope 03/10/2016   Acute right-sided low back pain without sciatica 11/18/2015   BPH associated with nocturia 11/18/2015   Cardiac device in situ 11/18/2015   Gastroesophageal reflux disease without esophagitis 11/18/2015   Ingrown nail 11/18/2015   Palpitations 11/18/2015   PVD (posterior vitreous detachment), both eyes 11/18/2015   Type 2 diabetes mellitus without complication, without long-term current use of insulin ([place]) 63/14/9702   Umbilical hernia 63/78/5885   Paroxysmal VT ([place]) 09/17/2015   Personal history of  other malignant neoplasm of skin 07/20/2011   Hyperlipidemia associated with type 2 diabetes mellitus ([place]) 01/26/2008   History of acute anterior wall MI 01/26/2008   ESOPHAGEAL STRICTURE 12/26/2007   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   HIATAL HERNIA 12/26/2007   DIVERTICULOSIS, COLON 12/26/2007   Coronary artery disease involving native coronary artery of native heart without angina pectoris 11/03/2004    PCP: Marin Olp, MD   REFERRING PROVIDER: Marin Olp, MD   REFERRING DIAG: Diagnosis R26.89 (ICD-10-CM) - Balance disorder R29.898 (ICD-10-CM) - Weakness of both lower extremities   THERAPY DIAG:  Muscle weakness (generalized)  Unsteadiness on feet  Rationale for Evaluation and Treatment Rehabilitation  ONSET DATE: 06/24/2022   SUBJECTIVE:   SUBJECTIVE STATEMENT:  Had first infusion tx Monday, SE are fatigue and jittery and I have that today. Prior to infusion doing very well PERTINENT HISTORY: #balance issues/leg weakness- worse since covid  in 2021 (severe illness 8 weeks in hospital and then rehab) but has had issues for years. PT was helpful in the past- refer back today to [facility] location  -also reports thigh pain with walking in big stores PAIN:  Are you having pain? NO  PRECAUTIONS: None  WEIGHT BEARING RESTRICTIONS: No  FALLS:  Has patient fallen in last 6 months? 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   OBJECTIVE:   DIAGNOSTIC FINDINGS: IMPRESSION: 1. 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 2. L1-2 and L3-4 left foraminal impingement, greater at L3-4 where the impingement is  from a herniation. 3. 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  MUSCLE LENGTH: Hamstrings: Right 70 deg; Left 70 deg Thomas test: B hip flexors tight  POSTURE: rounded shoulders, forward head, left pelvic obliquity, and patient reports scoliosis, rib hump on L.  PALPATION: TTP on lower back paraspinals.  LOWER EXTREMITY ROM:  B LE WFL, tightness in knee to chest and hip IR/ER.  LOWER EXTREMITY MMT:  MMT Right eval Left eval  Hip flexion 4- 4-  Hip extension    Hip abduction    Hip adduction    Hip internal rotation 3+ 3+  Hip external rotation 3+ 3+  Knee flexion 4- 4-  Knee extension 4- 4-  Ankle dorsiflexion 4- 4-  Ankle plantarflexion    Ankle inversion    Ankle eversion     (Blank rows = not tested)  LOWER EXTREMITY SPECIAL TESTS:  Slump test- neg  FUNCTIONAL TESTS:  5 times sit to stand: 12.06 Timed up and go (TUG): N/T Berg Balance Scale: 47 MCTSIB: Condition 1: Avg of 3 trials: 30 sec, Condition 2: Avg of 3 trials: 30 sec, Condition 3: Avg of 3 trials: 30 sec, Condition 4: Avg of 3 trials: 28 sec, and Total Score: 118/120 Patient with increased unsteadiness with eyes closed and on soft surface.  GAIT: Distance walked: 80 Assistive device utilized: None Level of assistance: Complete Independence Comments: WFL-reports limited in distance due to back pain.   TODAY'S TREATMENT                                                                                                                            DATE:   09/10/22 Assessed goals BERG 50/56 . MMT grossly 4/5 Knee ext 15# 2 sets 10 HS curl 25# 2 sets 10 Resisted Gait 30# 5 x 4 way STS on airex with wt ball 2 sets  10 Step up and step taps    08/21/22 NuStep L5 x 6 minutes B side stepping on Airex pad, 5 x each  direction. Alternating step taps on 6" step, progressed to multiple taps, then tapping with rotation, occasional unsteadiness noted, more in R stance B side to side stepping over poles on floor, increasing speed as tolerated, 6 times each direction Floor ladder, changing directions of steps upon therapist command. He was steady throughout. Sit to stand while holding 2# ball in BUE, with OHP at top of stance, 10 reps Repeat with chest press x 10 reps Bike for speed of movement-  2 x 30 sec fast RPM Very intense for him.  08/18/22 Bike L 5 6 min Knee ext 15# 2 sets 10 HS curl 25# 2 sets 10 Walking ball toss with head turns and direction changes-no LOB 30# resisted gait laterally with stepping over wAte bars 5 x each side- decreased control with going     eccentrically to the RT           30# resisted gait with 6 inch step up 5 x each leg           STS on airex with wt ball press 10 x           Tandem fwd and back in // bars on foam beam 5 x- cued to try and not use bars, slight instability           Side stepping on foam beam 5 x each way without UE and no LOB          Step up on BOSU fwd and laterally 10 x each leg for strength and balance ( es with focus on SLS moment- as pt struggles with this)   08/14/22 Bike L4 x 6 minutes Sit <> stand while standing on Airex pad and holding 2# ball. 1 x 10 with OHP, 1 x 10 with chest press Standing hip abd against Red Tband at calves, BUE support on counter 2 x 10 each leg Quick side to side steps over 3 poles on the floor, increasing speed as tolerated. 5 reps each direction Shoulder ext, rows, ER against 10# resistance, 2 x 10 reps each 4 square stepping in all directions, then changing directions unexpectedly upon therapist command.   08/11/22 Nustep L 5 42mn 30# resisted gait with 6 inch step up 5 x each leg 30# resisted gait laterally with stepping over wAte bars 4 x each side Ball toss on airex- SBA with good righting reactions 6 inch alt  step tap 20 x 2 sets Vector cone tap on and off airex CGA- good righting reactions 3# arms out marching fwd and back 20 feet each BOSU step up 10 x each fwd and 10 x each laterally with UE support for balance Knee ext 3 sets 10  10# HS curl 3 sets 10  20#   08/06/22 Recumbent bike L3 x 6 minutes Side stepping onto and off of airex pad x 10 each way Sit to stand from mat while holding 2# ball in BUE out in front of trunk   x 10 Repeat with OHP, mini squats instead of STS B side stepping against G tband resistance, 15 reps to each side Heel raises, 2 x 10 reps Alternating step taps, progressing to multiple taps with each foot, then tapping with rotation, and finally, placing one foot on the step and rotating trunk, 10 reps each. Floor ladder, quick steps, then side stepping,  then changing directions without warning. Occasional unsteadiness, but no LOB.  07/31/22 Nustep L 5 25mn Knee ext 2 sets 10  10# HS curl 2 sets 10  20# Resisted gait 4 ways 4 x each 20# STS on airex 10x- slightly elevated mat Foam beam side step and tandem in //bars 4 x each Red tband hip 3 way in // bars 15 x each  Education HEP for strength and balance. Hip 3 way red tband. Balance at counter marching fwd and back,side stepping, tandem and SLS  PATIENT EDUCATION:  Education details: HEP Person educated: Patient Education method: ECustomer service managerand Handout Education comprehension: verbalized understanding  HOME EXERCISE PROGRAM: See above  ASSESSMENT:  CLINICAL IMPRESSION:  Continue with HEP thru infusion, at this time withD/C and if pt needs therapy later will discuss with MD OBJECTIVE IMPAIRMENTS: decreased balance, decreased coordination, decreased endurance, difficulty walking, decreased ROM, decreased strength, impaired flexibility, improper body mechanics, postural dysfunction, and pain.   ACTIVITY LIMITATIONS: carrying, lifting, bending, squatting, stairs, and locomotion  level  PARTICIPATION LIMITATIONS: cleaning and community activity  PERSONAL FACTORS: Age are also affecting patient's functional outcome.   REHAB POTENTIAL: Good  CLINICAL DECISION MAKING: Stable/uncomplicated  EVALUATION COMPLEXITY: Moderate   GOALS: Goals reviewed with patient? Yes  SHORT TERM GOALS: Target date: 08/21/2022  I with basic HEP Baseline: Goal status: 07/31/22 MET  LONG TERM GOALS: Target date: 10/02/2022   I with final HEP Baseline:  Goal status: 09/10/22 MET  2.  Increase BERG score to at least 50 Baseline:  Goal status: MET 50/56 09/10/22  3.  Increase BLE strength to at least 4/5 throughout Baseline:  Goal status: grossly tested in sitting MET 12/723  4.  Patient will report no falls/near falls x 4 weeks in a row. Baseline: Multiple episodes of unsteadiness. Goal status: met 09/10/22  5.  Patient will ambulate at least 400' with no LOB, no C/O pain Baseline:  Goal status: MET 09/10/22  PLAN:  PT FREQUENCY: 2x/week  PT DURATION: 10 weeks  PLANNED INTERVENTIONS: Therapeutic exercises, Therapeutic activity, Neuromuscular re-education, Balance training, Gait training, Patient/Family education, Self Care, Joint mobilization, Stair training, Dry Needling, Cryotherapy, Moist heat, Traction, Ionotophoresis 470mml Dexamethasone, and Manual therapy  PLAN FOR NEXT SESSION D/C  Patient Details  Name: ChTerryn RednerRN: [id number] of Birth: [birth year]eferring Provider:  HuMarin OlpMD  Encounter Date: 09/10/2022 PHYSICAL THERAPY DISCHARGE SUMMARY   Patient agrees to discharge. Patient goals were met. Patient is being discharged due to being pleased with the current functional level.   AnLevada Dyayseur PTA 09/10/2022, 8:47 AM  [place]one: [phone] Fax:  [address]one: [phone] Fax:  [address]hone: [phone] Fax:  [phone] Details  Name: ChDanuel FelicettiRN: [id number] of Birth: 05/1941-03-12eferring Provider:  HuMarin OlpMD  Encounter Date: 09/10/2022   PALaqueta CarinaPTA 09/10/2022, 8:47 AM  [place]one: [phone] Fax:  [phone]

## 2022-09-14 ENCOUNTER — Inpatient Hospital Stay: Payer: Medicare Other

## 2022-09-14 VITALS — BP 112/67 | HR 60 | Temp 98.0°F | Resp 18

## 2022-09-14 DIAGNOSIS — C9000 Multiple myeloma not having achieved remission: Secondary | ICD-10-CM

## 2022-09-14 DIAGNOSIS — Z5112 Encounter for antineoplastic immunotherapy: Secondary | ICD-10-CM | POA: Diagnosis not present

## 2022-09-14 LAB — CMP (CANCER CENTER ONLY)
ALT: 15 U/L (ref 0–44)
AST: 16 U/L (ref 15–41)
Albumin: 4 g/dL (ref 3.5–5.0)
Alkaline Phosphatase: 69 U/L (ref 38–126)
Anion gap: 5 (ref 5–15)
BUN: 28 mg/dL — ABNORMAL HIGH (ref 8–23)
CO2: 29 mmol/L (ref 22–32)
Calcium: 9.2 mg/dL (ref 8.9–10.3)
Chloride: 101 mmol/L (ref 98–111)
Creatinine: 1.25 mg/dL — ABNORMAL HIGH (ref 0.61–1.24)
GFR, Estimated: 58 mL/min — ABNORMAL LOW (ref >60)
Glucose, Bld: 139 mg/dL — ABNORMAL HIGH (ref 70–99)
Potassium: 4.3 mmol/L (ref 3.5–5.1)
Sodium: 135 mmol/L (ref 135–145)
Total Bilirubin: 0.5 mg/dL (ref 0.3–1.2)
Total Protein: 7.9 g/dL (ref 6.5–8.1)

## 2022-09-14 LAB — CBC WITH DIFFERENTIAL (CANCER CENTER ONLY)
Abs Immature Granulocytes: 0.02 10*3/uL (ref 0.00–0.07)
Basophils Absolute: 0 10*3/uL (ref 0.0–0.1)
Basophils Relative: 0 %
Eosinophils Absolute: 0.3 10*3/uL (ref 0.0–0.5)
Eosinophils Relative: 5 %
HCT: 31.3 % — ABNORMAL LOW (ref 39.0–52.0)
Hemoglobin: 10.3 g/dL — ABNORMAL LOW (ref 13.0–17.0)
Immature Granulocytes: 0 %
Lymphocytes Relative: 22 %
Lymphs Abs: 1.5 10*3/uL (ref 0.7–4.0)
MCH: 31.3 pg (ref 26.0–34.0)
MCHC: 32.9 g/dL (ref 30.0–36.0)
MCV: 95.1 fL (ref 80.0–100.0)
Monocytes Absolute: 0.7 10*3/uL (ref 0.1–1.0)
Monocytes Relative: 10 %
Neutro Abs: 4.4 10*3/uL (ref 1.7–7.7)
Neutrophils Relative %: 63 %
Platelet Count: 129 10*3/uL — ABNORMAL LOW (ref 150–400)
RBC: 3.29 MIL/uL — ABNORMAL LOW (ref 4.22–5.81)
RDW: 13.4 % (ref 11.5–15.5)
WBC Count: 6.9 10*3/uL (ref 4.0–10.5)
nRBC: 0 % (ref 0.0–0.2)

## 2022-09-14 MED ORDER — BORTEZOMIB CHEMO SQ INJECTION 3.5 MG (2.5MG/ML)
1.3000 mg/m2 | Freq: Once | INTRAMUSCULAR | Status: AC
  Administered 2022-09-14: 2.75 mg via SUBCUTANEOUS
  Filled 2022-09-14: qty 1.1

## 2022-09-14 MED ORDER — MONTELUKAST SODIUM 10 MG PO TABS
10.0000 mg | ORAL_TABLET | Freq: Once | ORAL | Status: DC
  Filled 2022-09-14: qty 1

## 2022-09-14 MED ORDER — DIPHENHYDRAMINE HCL 25 MG PO CAPS: 50.0000 mg | ORAL_CAPSULE | Freq: Once | ORAL | Status: DC

## 2022-09-14 MED ORDER — DEXAMETHASONE 4 MG PO TABS: 20.0000 mg | ORAL_TABLET | Freq: Once | ORAL | Status: DC

## 2022-09-14 MED ORDER — DARATUMUMAB-HYALURONIDASE-FIHJ 1800-30000 MG-UT/15ML ~~LOC~~ SOLN
1800.0000 mg | Freq: Once | SUBCUTANEOUS | Status: AC
  Administered 2022-09-14: 1800 mg via SUBCUTANEOUS
  Filled 2022-09-14: qty 15

## 2022-09-14 MED ORDER — ACETAMINOPHEN 325 MG PO TABS: 650.0000 mg | ORAL_TABLET | Freq: Once | ORAL | Status: DC

## 2022-09-15 ENCOUNTER — Ambulatory Visit: Payer: Medicare Other | Admitting: Physical Therapy

## 2022-09-17 ENCOUNTER — Encounter: Payer: Self-pay | Admitting: *Deleted

## 2022-09-17 ENCOUNTER — Ambulatory Visit: Payer: Medicare Other | Admitting: Physical Therapy

## 2022-09-18 ENCOUNTER — Telehealth: Payer: Self-pay | Admitting: *Deleted

## 2022-09-18 ENCOUNTER — Other Ambulatory Visit: Payer: Self-pay | Admitting: *Deleted

## 2022-09-18 DIAGNOSIS — C9000 Multiple myeloma not having achieved remission: Secondary | ICD-10-CM

## 2022-09-18 NOTE — Telephone Encounter (Signed)
Returned patient's phone call regarding nausea. Patient stated,"I didn't think I was having nausea. It was a pain/feeling in my upper chest for a day or so. I didn't take any medications for it. Last night I took a Zofran, and the feeling went away. I slept through the night and the feeling went away in my chest." I instructed him to take the Zofran for the nausea. We will recheck his labs on Monday. He has enough medication to get through the weekend. He verbalized understanding.

## 2022-09-21 ENCOUNTER — Ambulatory Visit (HOSPITAL_BASED_OUTPATIENT_CLINIC_OR_DEPARTMENT_OTHER)
Admission: RE | Admit: 2022-09-21 | Discharge: 2022-09-21 | Disposition: A | Discharge status: Home / Self Care | Payer: Medicare Other | Source: Ambulatory Visit | Attending: Hematology & Oncology | Admitting: Hematology & Oncology

## 2022-09-21 ENCOUNTER — Inpatient Hospital Stay (HOSPITAL_BASED_OUTPATIENT_CLINIC_OR_DEPARTMENT_OTHER)
Admission: EM | Admit: 2022-09-21 | Discharge: 2022-09-23 | DRG: 389 | Disposition: A | Discharge status: Home / Self Care | Payer: Medicare Other | Attending: Internal Medicine | Admitting: Internal Medicine

## 2022-09-21 ENCOUNTER — Inpatient Hospital Stay (HOSPITAL_BASED_OUTPATIENT_CLINIC_OR_DEPARTMENT_OTHER): Payer: Medicare Other | Admitting: Hematology & Oncology

## 2022-09-21 ENCOUNTER — Emergency Department (HOSPITAL_BASED_OUTPATIENT_CLINIC_OR_DEPARTMENT_OTHER): Payer: Medicare Other

## 2022-09-21 ENCOUNTER — Other Ambulatory Visit: Payer: Self-pay

## 2022-09-21 ENCOUNTER — Encounter: Payer: Self-pay | Admitting: Hematology & Oncology

## 2022-09-21 ENCOUNTER — Encounter (HOSPITAL_BASED_OUTPATIENT_CLINIC_OR_DEPARTMENT_OTHER): Payer: Self-pay | Admitting: Emergency Medicine

## 2022-09-21 ENCOUNTER — Inpatient Hospital Stay: Payer: Medicare Other

## 2022-09-21 VITALS — BP 116/54 | HR 81 | Temp 97.7°F | Resp 20

## 2022-09-21 DIAGNOSIS — E785 Hyperlipidemia, unspecified: Secondary | ICD-10-CM | POA: Diagnosis present

## 2022-09-21 DIAGNOSIS — C9000 Multiple myeloma not having achieved remission: Secondary | ICD-10-CM

## 2022-09-21 DIAGNOSIS — D649 Anemia, unspecified: Secondary | ICD-10-CM | POA: Diagnosis present

## 2022-09-21 DIAGNOSIS — Z807 Family history of other malignant neoplasms of lymphoid, hematopoietic and related tissues: Secondary | ICD-10-CM | POA: Diagnosis not present

## 2022-09-21 DIAGNOSIS — Z955 Presence of coronary angioplasty implant and graft: Secondary | ICD-10-CM

## 2022-09-21 DIAGNOSIS — Z7902 Long term (current) use of antithrombotics/antiplatelets: Secondary | ICD-10-CM

## 2022-09-21 DIAGNOSIS — E876 Hypokalemia: Secondary | ICD-10-CM | POA: Diagnosis present

## 2022-09-21 DIAGNOSIS — Z882 Allergy status to sulfonamides status: Secondary | ICD-10-CM

## 2022-09-21 DIAGNOSIS — I2510 Atherosclerotic heart disease of native coronary artery without angina pectoris: Secondary | ICD-10-CM | POA: Diagnosis present

## 2022-09-21 DIAGNOSIS — R140 Abdominal distension (gaseous): Principal | ICD-10-CM

## 2022-09-21 DIAGNOSIS — K56600 Partial intestinal obstruction, unspecified as to cause: Secondary | ICD-10-CM | POA: Diagnosis not present

## 2022-09-21 DIAGNOSIS — I252 Old myocardial infarction: Secondary | ICD-10-CM

## 2022-09-21 DIAGNOSIS — K219 Gastro-esophageal reflux disease without esophagitis: Secondary | ICD-10-CM | POA: Diagnosis present

## 2022-09-21 DIAGNOSIS — Z91048 Other nonmedicinal substance allergy status: Secondary | ICD-10-CM

## 2022-09-21 DIAGNOSIS — K429 Umbilical hernia without obstruction or gangrene: Secondary | ICD-10-CM | POA: Diagnosis present

## 2022-09-21 DIAGNOSIS — N179 Acute kidney failure, unspecified: Secondary | ICD-10-CM | POA: Diagnosis present

## 2022-09-21 DIAGNOSIS — Z8049 Family history of malignant neoplasm of other genital organs: Secondary | ICD-10-CM | POA: Diagnosis not present

## 2022-09-21 DIAGNOSIS — E119 Type 2 diabetes mellitus without complications: Secondary | ICD-10-CM | POA: Diagnosis present

## 2022-09-21 DIAGNOSIS — Z885 Allergy status to narcotic agent status: Secondary | ICD-10-CM

## 2022-09-21 DIAGNOSIS — N400 Enlarged prostate without lower urinary tract symptoms: Secondary | ICD-10-CM | POA: Diagnosis present

## 2022-09-21 DIAGNOSIS — Z8042 Family history of malignant neoplasm of prostate: Secondary | ICD-10-CM | POA: Diagnosis not present

## 2022-09-21 DIAGNOSIS — Z8249 Family history of ischemic heart disease and other diseases of the circulatory system: Secondary | ICD-10-CM | POA: Diagnosis not present

## 2022-09-21 DIAGNOSIS — Z79899 Other long term (current) drug therapy: Secondary | ICD-10-CM

## 2022-09-21 DIAGNOSIS — K5651 Intestinal adhesions [bands], with partial obstruction: Secondary | ICD-10-CM | POA: Diagnosis not present

## 2022-09-21 DIAGNOSIS — K56609 Unspecified intestinal obstruction, unspecified as to partial versus complete obstruction: Secondary | ICD-10-CM

## 2022-09-21 DIAGNOSIS — Z91040 Latex allergy status: Secondary | ICD-10-CM

## 2022-09-21 LAB — CMP (CANCER CENTER ONLY)
ALT: 18 U/L (ref 0–44)
AST: 13 U/L — ABNORMAL LOW (ref 15–41)
Albumin: 3.8 g/dL (ref 3.5–5.0)
Alkaline Phosphatase: 59 U/L (ref 38–126)
Anion gap: 9 (ref 5–15)
BUN: 52 mg/dL — ABNORMAL HIGH (ref 8–23)
CO2: 27 mmol/L (ref 22–32)
Calcium: 8.3 mg/dL — ABNORMAL LOW (ref 8.9–10.3)
Chloride: 96 mmol/L — ABNORMAL LOW (ref 98–111)
Creatinine: 1.6 mg/dL — ABNORMAL HIGH (ref 0.61–1.24)
GFR, Estimated: 43 mL/min — ABNORMAL LOW (ref >60)
Glucose, Bld: 184 mg/dL — ABNORMAL HIGH (ref 70–99)
Potassium: 3.9 mmol/L (ref 3.5–5.1)
Sodium: 132 mmol/L — ABNORMAL LOW (ref 135–145)
Total Bilirubin: 0.9 mg/dL (ref 0.3–1.2)
Total Protein: 6.9 g/dL (ref 6.5–8.1)

## 2022-09-21 LAB — CBC WITH DIFFERENTIAL (CANCER CENTER ONLY)
Abs Immature Granulocytes: 0.01 10*3/uL (ref 0.00–0.07)
Basophils Absolute: 0 10*3/uL (ref 0.0–0.1)
Basophils Relative: 0 %
Eosinophils Absolute: 0.1 10*3/uL (ref 0.0–0.5)
Eosinophils Relative: 3 %
HCT: 31.3 % — ABNORMAL LOW (ref 39.0–52.0)
Hemoglobin: 10.5 g/dL — ABNORMAL LOW (ref 13.0–17.0)
Immature Granulocytes: 0 %
Lymphocytes Relative: 24 %
Lymphs Abs: 1.2 10*3/uL (ref 0.7–4.0)
MCH: 30.9 pg (ref 26.0–34.0)
MCHC: 33.5 g/dL (ref 30.0–36.0)
MCV: 92.1 fL (ref 80.0–100.0)
Monocytes Absolute: 1.7 10*3/uL — ABNORMAL HIGH (ref 0.1–1.0)
Monocytes Relative: 36 %
Neutro Abs: 1.8 10*3/uL (ref 1.7–7.7)
Neutrophils Relative %: 37 %
Platelet Count: 127 10*3/uL — ABNORMAL LOW (ref 150–400)
RBC: 3.4 MIL/uL — ABNORMAL LOW (ref 4.22–5.81)
RDW: 12.9 % (ref 11.5–15.5)
Smear Review: NORMAL
WBC Count: 4.8 10*3/uL (ref 4.0–10.5)
nRBC: 0 % (ref 0.0–0.2)

## 2022-09-21 LAB — LACTIC ACID, PLASMA: Lactic Acid, Venous: 0.9 mmol/L (ref 0.5–1.9)

## 2022-09-21 MED ORDER — ONDANSETRON HCL 4 MG/2ML IJ SOLN: 4.0000 mg | Freq: Four times a day (QID) | INTRAMUSCULAR | Status: DC | PRN

## 2022-09-21 MED ORDER — SODIUM CHLORIDE 0.9 % IV BOLUS
1000.0000 mL | Freq: Once | INTRAVENOUS | Status: AC
  Administered 2022-09-21: 1000 mL via INTRAVENOUS

## 2022-09-21 MED ORDER — IOHEXOL 300 MG/ML  SOLN
80.0000 mL | Freq: Once | INTRAMUSCULAR | Status: AC | PRN
  Administered 2022-09-21: 80 mL via INTRAVENOUS

## 2022-09-21 MED ORDER — SODIUM CHLORIDE 0.9 % IV SOLN: INTRAVENOUS | Status: DC

## 2022-09-21 MED ORDER — SODIUM CHLORIDE 0.9 % IV SOLN
10.0000 mg | Freq: Once | INTRAVENOUS | Status: AC
  Administered 2022-09-21: 10 mg via INTRAVENOUS
  Filled 2022-09-21: qty 10

## 2022-09-21 MED ORDER — MELATONIN 3 MG PO TABS
3.0000 mg | ORAL_TABLET | Freq: Every day | ORAL | Status: DC
  Administered 2022-09-21 – 2022-09-22 (×2): 3 mg via ORAL
  Filled 2022-09-21 (×2): qty 1

## 2022-09-21 MED ORDER — LORAZEPAM 2 MG/ML IJ SOLN
0.2500 mg | Freq: Once | INTRAMUSCULAR | Status: AC
  Administered 2022-09-21: 0.25 mg via INTRAVENOUS
  Filled 2022-09-21: qty 1

## 2022-09-21 NOTE — Progress Notes (Signed)
Dr. Marin Olp notified that patient has had nausea and vomiting since last week Wednesday, has not had a BM since last week Tuesday, is dizzy, weak, lightheaded, SOB at times, denies any pain, chills or fever, has been sipping water only since last week Wednesday, has taken Zofran and Compazine at home with no relief of nausea.  Order received from Dr. Marin Olp for pt to hold treatment for today and to also hold Revlimid.  Pt is to also have a 2 view abd xray done now and get Decadron 10 mg IV and Ativan 0.25 mg IV now. Pt and pt.'s wife notified of above.  Pt verbalizes an understanding to hold Revlimid.

## 2022-09-21 NOTE — Progress Notes (Signed)
Hematology and Oncology Follow Up Visit  Alexander Armstrong [id number] Mar 04, 1941 81 y.o. 09/21/2022   Principle Diagnosis:  IgG Kappa myeloma --complex chromosomal abnormalities   Current Therapy:   Faspro/Velcade/Revlimid/Decadron -- start cycle #1 on 09/04/2022     Interim History:  Alexander Armstrong is back for an unscheduled visit.  He was back to get treatment.  He was have day 15 of treatment.  However, last week began to have nausea and vomiting.  He started on Wednesday.  He had this all the way through the weekend.  He has abdominal distention.  He had a plain film done of the abdomen today.  This showed air-fluid levels consistent with bowel obstruction.  He has never had abdominal surgery.  He does have a umbilical hernia.  I will know if this might be a problem.  He is not really complaining of abdominal pain.  He has had not had a bowel movement for many days.  He really has not eaten all that much because of the nausea and vomiting.  The protocol that we have on rarely, really causes this kind of problem with respect to nausea and vomiting.  I felt that there is another etiology for this.  As such, we did the plain film and this showed a bowel obstruction.  He is getting IV fluids in the office.   His labs show some dehydration.  His sodium is 132 with a chloride of 96.  His BUN is 52 creatinine 1.6.  His white count is 4.8.  Hemoglobin 10.5.  His platelet count is 127,000.  He has had no fever.  There is been no obvious bleeding.   Overall, his performance status is ECOG 1.  Medications:  Current Outpatient Medications:    acetaminophen (TYLENOL) 500 MG tablet, Take 1,000 mg by mouth 3 (three) times daily as needed for moderate pain or headache., Disp: , Rfl:    Cholecalciferol (VITAMIN D3) 1000 units CAPS, Take 1,000 Units by mouth at bedtime., Disp: , Rfl:    clopidogrel (PLAVIX) 75 MG tablet, TAKE 1 TABLET(75 MG) BY MOUTH DAILY, Disp: 90 tablet, Rfl: 2   Cyanocobalamin  1000 MCG TBCR, Take by mouth daily., Disp: , Rfl:    dexamethasone (DECADRON) 4 MG tablet, Take 5 tabs (20 mg) weekly the day of daratumumab. Take with breakfast., Disp: 60 tablet, Rfl: 5   diltiazem (CARDIZEM) 60 MG tablet, Take 1 tablet (60 mg total) by mouth 4 (four) times daily. As needed for fast heart rate (Patient not taking: Reported on 08/13/2022), Disp: 60 tablet, Rfl: 1   diltiazem (DILT-XR) 120 MG 24 hr capsule, Take 1 capsule (120 mg total) by mouth daily., Disp: 90 capsule, Rfl: 1   famciclovir (FAMVIR) 250 MG tablet, Take 1 tablet (250 mg total) by mouth daily., Disp: 30 tablet, Rfl: 6   fluticasone (FLONASE) 50 MCG/ACT nasal spray, Place 1 spray into both nostrils daily., Disp: , Rfl:    lenalidomide (REVLIMID) 15 MG capsule, Take 1 capsule (15 mg total) by mouth daily. [employer] Auth # [id number]    Date Obtained 08/14/2022, Disp: 21 capsule, Rfl: 0   loratadine (CLARITIN) 10 MG tablet, Take 1 tablet by mouth daily., Disp: , Rfl:    montelukast (SINGULAIR) 10 MG tablet, Take 1 tablet (10 mg total) by mouth at bedtime., Disp: 30 tablet, Rfl: 6   ondansetron (ZOFRAN) 8 MG tablet, Take 1 tablet (8 mg total) by mouth every 8 (eight) hours as needed for nausea or vomiting., Disp: 30  tablet, Rfl: 1   pantoprazole (PROTONIX) 40 MG tablet, Take 1 tablet (40 mg total) by mouth daily., Disp: 90 tablet, Rfl: 3   prochlorperazine (COMPAZINE) 10 MG tablet, Take 1 tablet (10 mg total) by mouth every 6 (six) hours as needed for nausea or vomiting., Disp: 30 tablet, Rfl: 1   rosuvastatin (CRESTOR) 20 MG tablet, Take 1 tablet (20 mg total) by mouth daily., Disp: 90 tablet, Rfl: 3   tamsulosin (FLOMAX) 0.4 MG CAPS capsule, Take 0.4 mg by mouth daily. Dr. Yong Channel willing to refill, Disp: , Rfl:  No current facility-administered medications for this visit.  Facility-Administered Medications Ordered in Other Visits:    0.9 %  sodium chloride infusion, , Intravenous, Continuous, Urie Loughner, Rudell Cobb, MD, Last  Rate: 500 mL/hr at 09/21/22 1217, New Bag at 09/21/22 1217  Allergies:  Allergies  Allergen Reactions   Silver Sulfadiazine Hives and Rash   Latex Other (See Comments)    "takes the skin off"   Adhesive [Tape] Rash   Hydrocodone Rash    Past Medical History, Surgical history, Social history, and Family History were reviewed and updated.  Review of Systems: Review of Systems  Constitutional: Negative.   HENT:  Negative.    Eyes: Negative.   Respiratory: Negative.    Cardiovascular: Negative.   Gastrointestinal: Negative.   Endocrine: Negative.   Genitourinary: Negative.    Musculoskeletal: Negative.   Skin: Negative.   Neurological: Negative.   Hematological: Negative.   Psychiatric/Behavioral: Negative.      Physical Exam:  oral temperature is 97.7 F (36.5 C). His blood pressure is 116/54 (abnormal) and his pulse is 81. His respiration is 20 and oxygen saturation is 99%.   Wt Readings from Last 3 Encounters:  09/07/22 201 lb (91.2 kg)  08/31/22 201 lb (91.2 kg)  08/13/22 201 lb (91.2 kg)    Physical Exam Vitals reviewed.  HENT:     Head: Normocephalic and atraumatic.  Eyes:     Pupils: Pupils are equal, round, and reactive to light.  Cardiovascular:     Rate and Rhythm: Normal rate and regular rhythm.     Heart sounds: Normal heart sounds.  Pulmonary:     Effort: Pulmonary effort is normal.     Breath sounds: Normal breath sounds.  Abdominal:     General: Bowel sounds are normal.     Palpations: Abdomen is soft.     Comments: Abdominal exam is distended.  His hyperactive bowel sounds are high-pitched.  He does have the umbilical hernia.  There is no guarding or rebound tenderness to palpation.  There is no obvious fluid wave.  He has no palpable liver or spleen tip.  Musculoskeletal:        General: No tenderness or deformity. Normal range of motion.     Cervical back: Normal range of motion.  Lymphadenopathy:     Cervical: No cervical adenopathy.   Skin:    General: Skin is warm and dry.     Findings: No erythema or rash.  Neurological:     Mental Status: He is alert and oriented to person, place, and time.  Psychiatric:        Behavior: Behavior normal.        Thought Content: Thought content normal.        Judgment: Judgment normal.     Lab Results  Component Value Date   WBC 4.8 09/21/2022   HGB 10.5 (L) 09/21/2022   HCT 31.3 (L) 09/21/2022  MCV 92.1 09/21/2022   PLT 127 (L) 09/21/2022     Chemistry      Component Value Date/Time   NA 132 (L) 09/21/2022 1111   K 3.9 09/21/2022 1111   CL 96 (L) 09/21/2022 1111   CO2 27 09/21/2022 1111   BUN 52 (H) 09/21/2022 1111   CREATININE 1.60 (H) 09/21/2022 1111   CREATININE 1.33 (H) 01/21/2017 1616      Component Value Date/Time   CALCIUM 8.3 (L) 09/21/2022 1111   ALKPHOS 59 09/21/2022 1111   AST 13 (L) 09/21/2022 1111   ALT 18 09/21/2022 1111   BILITOT 0.9 09/21/2022 1111       Impression and Plan: Alexander Armstrong is a very nice 81 year old white male.  He has IgG kappa myeloma.  I think what troubles me is his cytogenetics.  He has a very complex karyotype.  Again he probably has about 12 different abnormalities with his chromosomes in the bone marrow.  I suspect this probably will put him at high risk.  Again, there has to be someone else going on with him.  I just not seen a problem with nausea and vomiting like he has had with this protocol.  Looks like there is obstruction.  I suspect he is going need to have a CT scan done.  Hopefully, he will not need to have an NG tube placed.  He has had constipation in the past.  I suppose this might also be an etiology.  We will send him to the ER.  I spoke to the ER doctor.  If he is admitted, we will find him in the hospital.    Volanda Napoleon, MD 12/18/20231:44 PM

## 2022-09-21 NOTE — ED Triage Notes (Signed)
Pt arrives pov, to triage in wheelchair. Pt referred from Dr Marin Olp. Pt endorses constipation x 1 week, n/v x 5 days. Abdomen distended

## 2022-09-21 NOTE — ED Notes (Signed)
REPORT GIVEN TO SUSAN CHARGE NURSE @ [facility]

## 2022-09-21 NOTE — Consult Note (Signed)
Reason for Consult: vomiting Referring Provider: Garnette Gunner  Alexander Armstrong is an 81 y.o. male.  HPI: 81 yo male with multiple myeloma underwent treatment 14 days ago. 6 days ago he became ill and vomited multiple times a day for the next 4 days. He has also not had a bowel movement in 7 days. Today he has not vomited and did have a bowel movement. There was concern for his umbilical hernia being very tender at outside ED. He denies pain currently.  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 Ant Stemi (11/03/04) mLAD - PCI: Taxus DES 3.0 x 20; b. (11/06/04) staged RCA DES PCI: Taxus DES 3.5 x 16; c. (01/2017) High Risk Myoview (~fixed anterior defect with normal WM) @ [facility] (Dr. [name]) Synergy DES 3.0 x 12 (for 99% lesion @ prox edge of old stent   Cardiac syncope    No episode since starting flecainide. Had documented WIDE COMPLEX Tachycardia on loop recorder.   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 Unable to induce during EP study.;  Due to CAD, flecainide discontinued -> intolerant of both mexiletine and amiodarone -> Dr. Curt Bears (EP-Cards) d/c'd mexilitine & statrted low dose Diltizem XT; Holter Monitor 12/2018 - HR 47-114, rate PACs/PVCs, no Afib/flutter or SVT, VT.    Reflux gastritis    STEMI involving left anterior descending coronary artery ([place]) 11/03/2004   [facility]: Occluded LAD treated with DES. Staged PCI to the RCA.    Past Surgical History:  Procedure Laterality Date   CARDIAC CATHETERIZATION  ~2011   [facility]: re-look Cath - patent stents    CORONARY STENT INTERVENTION N/A 01/27/2017   Procedure: Coronary Stent Intervention;  Surgeon: [name], MD;  Location: [facility]: mLAD (just after D1) overlapping prior DES -> SYNERGY DES 3X12 drug eluting stent   ELECTROPHYSIOLOGIC STUDY  2014   Unable to stimulate VT seen on loop recorder   HOLTER MONITOR  12/2018    (Off of mexiletine, only on diltiazem) heart rate ranged from 47 to 114 bpm.  Rare PACs and PVCs.  Sinus rhythm noted no A. fib or other arrhythmia.   implanted loop heart monitor x2  ~2011; 2014   Per report- batter out of date; apparently captured a prolonged episode of wide complex tachycardia associated with an episode of weakness/near syncope   INTRAVASCULAR PRESSURE WIRE/FFR STUDY N/A 01/27/2017   Procedure: Intravascular Pressure Wire/FFR Study;  Surgeon: [name], MD;  Location: [place] [facility];  Service: Cardiovascular;  Laterality: N/A;   LEFT HEART CATH AND CORONARY ANGIOGRAPHY N/A 01/27/2017   Procedure: Left Heart Cath and Coronary Angiography;  Surgeon: [name], MD;  Location: [facility] INVASIVE CV LAB: mLAD 99% stenosis pre-Stent & ISR --> PCI. Ost DI ~50%. pRCA DES ~50-60% FFR 0.86.   NM MYOVIEW LTD  07/2014   Unremarkable EKG. No evidence of ischemia or infarct. Mild anterior attenuation, cannot exclude prior infarct, but nonreversible. EF 66%.   NM MYOVIEW LTD  12/22/2016   INTERMEDIATE RISK. EF 58%. Defect 1:  Medium size severe defect in the mid anterior, apical anterior and apical wall consistent with prior anterior MI. Defect to: Large defect and moderate severity in the basal inferoseptal basal inferior and inferoseptal mid inferior wall consistent with ischemia.   NM MYOVIEW LTD  04/05/2019   EF 55 to 65%.  Small size, mild severity fixed perfusion defect in the mid-apical anterior septal wall.  Likely represents old anterior infarct.   No evidence of ischemia. LOW RISK.    PERCUTANEOUS CORONARY STENT INTERVENTION (PCI-S)   January-February 2006   a. mLAD - Taxus DES 3.0 x 20; b. (11/06/04) staged PCI to RCA Taxus DES 3.5 x 16; c. 01/27/2017: PCI to mLAD prox to old  stent - Synergy DES 3.0 x 12   TRANSTHORACIC ECHOCARDIOGRAM  10/11/2020    [facility]):  Normal LV size & function. EF 55-60%. NO RWMA. Normal filling parameters.  Mild TR however estimated moderate pulmonary hypertension with RVSP 52 mmHg.  IVC is mildly dilated.  No vegetations noted.  No significant change from last study.   TRANSTHORACIC ECHOCARDIOGRAM  05/2020   [facility]: 05/21/2020: Normal LV size and function.  Normal valves.  No effusion.; 06/04/2020: Normal LV size and function EF 60 to 65%.  Trace MR and TR.  No pulmonary hypertension.  Stable   UMBILICAL HERNIA REPAIR      Family History  Problem Relation Age of Onset   Uterine cancer Mother    Multiple myeloma Mother    Heart disease Mother    Prostate cancer Father    Cancer Father        sarcoma   Macular degeneration Father    Heart attack Brother    Glaucoma Paternal Aunt    Colon cancer Neg Hx    Esophageal cancer Neg Hx    Rectal cancer Neg Hx    Stomach cancer Neg Hx     Social History:  reports that he has never smoked. He has never used smokeless tobacco. He reports that he does not drink alcohol and does not use drugs.  Allergies:  Allergies  Allergen Reactions   Silver Sulfadiazine Hives and Rash   Latex Other (See Comments)    "takes the skin off"   Adhesive [Tape] Rash   Hydrocodone Rash    Medications: I have reviewed the patient's current medications.  Results for orders placed or performed during the hospital encounter of 09/21/22 (from the past 48 hour(s))  Lactic acid, plasma     Status: None   Collection Time: 09/21/22  2:43 PM  Result Value Ref Range   Lactic Acid, Venous 0.9 0.5 - 1.9 mmol/L    Comment: Performed at [facility], [address] [place], [place] [address]    CT Abdomen Pelvis W Contrast  Result Date:  09/21/2022 CLINICAL DATA:  Nausea, vomiting, abdominal distention EXAM: CT ABDOMEN AND PELVIS WITH CONTRAST TECHNIQUE: Multidetector CT imaging of the abdomen and pelvis was performed using the standard protocol following bolus administration of intravenous contrast. RADIATION DOSE REDUCTION: This exam was performed according to the departmental dose-optimization program which includes automated exposure control, adjustment of the mA and/or kV according to patient size and/or use of iterative reconstruction technique. CONTRAST:  75m OMNIPAQUE IOHEXOL 300 MG/ML  SOLN COMPARISON:  Abdomen radiographs done today FINDINGS: Lower chest: Small linear patchy infiltrates are seen in both lower lung fields, more so in the left lower lobe. Coronary artery calcifications are seen. Hepatobiliary: No focal abnormalities are  seen in liver. There is no dilation of bile ducts. Gallbladder is distended. There is no wall thickening in gallbladder. Pancreas: No focal abnormalities are seen. Spleen: Unremarkable. Adrenals/Urinary Tract: Adrenals are unremarkable. There is no hydronephrosis. There are no renal or ureteral stones. Urinary bladder is unremarkable. Stomach/Bowel: Small hiatal hernia is seen. Stomach is unremarkable. There is abnormal dilation of proximal small bowel loops measuring up to 5 cm in diameter. Distal small bowel loops are decompressed. There is umbilical hernia containing short segment of a small bowel loop. There is mild edema in the fat planes within the umbilical hernia. Small bowel loops in the left side of the abdomen proximal to the hernia appear to be dilated and small bowel loops in the right side of abdomen distal to the hernia appear to be partly decompressed. Appendix is not dilated. There is no significant wall thickening in colon. Multiple diverticula are seen in left colon without signs of focal acute diverticulitis. Vascular/Lymphatic: Scattered arterial calcifications are seen in aorta and its  major branches. Reproductive: Coarse calcification is seen in prostate. Other: There is no ascites or pneumoperitoneum. Right inguinal hernia containing fat is seen. Musculoskeletal: Schmorl's node is seen in the upper endplate of body of L2 vertebra. Degenerative changes are noted with encroachment of neural foramina at multiple levels, more so at L4-L5 and L5-S1 levels. IMPRESSION: There is abnormal dilation of proximal small bowel loops with decompression of distal small bowel loops. There is umbilical hernia containing short segment of small bowel loop. Findings suggest partial obstruction of small bowel related to incarcerated obstructing umbilical hernia. Surgical consultation should be considered. There is no hydronephrosis. Appendix is not dilated. 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 Small hiatal hernia. There are linear densities in the lower lung fields suggesting subsegmental atelectasis. Minimal right pleural effusion. Coronary artery disease. Aortic arteriosclerosis. Imaging findings were relayed to patient's provider in the emergency room by telephone call. Electronically Signed   By: Elmer Picker M.D.   On: 09/21/2022 16:53   DG Abd 2 Views  Result Date: 09/21/2022 CLINICAL DATA:  Nausea, vomiting, constipation EXAM: ABDOMEN - 2 VIEW COMPARISON:  None Available. FINDINGS: Numerous distended, air and fluid filled loops of small bowel in the central abdomen, measuring up to 5.9 cm in caliber. Colon is nondistended with gas and stool present to the rectum. No free air in the abdomen. Osseous structures unremarkable. IMPRESSION: Numerous distended, air and fluid-filled loops of small bowel in the central abdomen, concerning for small bowel obstruction. Consider CT to further evaluate. No free air in the abdomen. Electronically Signed   By: Delanna Ahmadi M.D.   On: 09/21/2022 12:59    Review of Systems  Constitutional:  Positive for malaise/fatigue.  HENT:  Negative.    Eyes: Negative.   Respiratory: Negative.    Cardiovascular: Negative.   Gastrointestinal:  Positive for constipation, nausea and vomiting.  Genitourinary: Negative.   Musculoskeletal: Negative.   Skin: Negative.   Neurological: Negative.   Endo/Heme/Allergies: Negative.   Psychiatric/Behavioral: Negative.      PE Blood pressure 113/68, pulse 60, temperature 98.1 F (36.7 C), temperature source Oral, resp. rate 16, weight 87.5 kg, SpO2 97 %. Constitutional: NAD; conversant; no deformities Eyes: Moist conjunctiva; no lid lag; anicteric; PERRL Neck: Trachea midline; no thyromegaly Lungs: Normal respiratory effort; no tactile fremitus CV: RRR; no palpable thrills; no pitting edema GI: Abd soft, slight periumbilical tenderness, easily reducible; no palpable hepatosplenomegaly MSK: Normal gait; no clubbing/cyanosis Psychiatric: Appropriate affect;  alert and oriented x3 Lymphatic: No palpable cervical or axillary lymphadenopathy Skin: No major subcutaneous nodules. Warm and dry   Assessment/Plan: 81 yo male with multiple days of vomiting, CT scan showing dilation with likely partial small bowel obstruction. AKI with Cr 1.6 from baseline 1.2 -admit to medicine -no signs of incarceration of hernia -bowel rest -IV fluid hydration  I reviewed last 24 h vitals and pain scores, last 48 h intake and output, last 24 h labs and trends, and last 24 h imaging results.  This care required high  level of medical decision making.   Arta Bruce Holleigh Crihfield 09/21/2022, 8:43 PM

## 2022-09-21 NOTE — ED Notes (Signed)
Pt has had several events of bradycardia, EDP made aware

## 2022-09-21 NOTE — ED Provider Notes (Signed)
.    Patient received prior provider at 1500, please see their note for complete H&P.  In short this is a 81 year old male presenting with a chief complaint of abdominal pain, nausea vomiting, distention poor p.o. intake.  Labs from clinic this morning were reassuring. CT scan resulted with small bowel obstruction and incarcerated hernia.  Attempted reduction without success in emergency department.  Dr. Kieth Brightly was consulted with general surgery.  He recommended ED to ED transfer to [facility] for repeat attempt at reduction and admission for small bowel obstruction.   Tretha Sciara, MD 09/21/22 1754

## 2022-09-21 NOTE — ED Provider Notes (Signed)
[facility] EMERGENCY DEPARTMENT Provider Note  CSN: [id number] Arrival date & time: 09/21/22 1356  Chief Complaint(s) Abdominal Pain  HPI Alexander Armstrong is a 81 y.o. male with history of multiple myeloma, coronary artery disease, hyperlipidemia presenting to the emergency department with abdominal pain.  Patient reports 1 week of constipation and decreased bowel movement, 5 days of nausea and vomiting.  He has been able to keep down some fluids.  He reports that he has been passing gas.  Has also had some vague abdominal pain and abdominal distention.  Followed up with his oncologist today who gave him fluids and nausea medication in the infusion center, had x-ray of his abdomen performed showing possible small bowel obstruction and transferred here for further evaluation.  Denies chest pain, shortness of breath, fevers or chills.  No urinary symptoms.   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 Ant Stemi (11/03/04) mLAD - PCI: Taxus DES 3.0 x 20; b. (11/06/04) staged RCA DES PCI: Taxus DES 3.5 x 16; c. (01/2017) High Risk Myoview (~fixed anterior defect with normal WM) @ [facility] (Dr. [name]) Synergy DES 3.0 x 12 (for 99% lesion @ prox edge of old stent   Cardiac syncope    No episode since starting flecainide. Had documented WIDE COMPLEX Tachycardia on loop recorder.   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 Unable to induce during EP study.;  Due to CAD, flecainide discontinued -> intolerant of both mexiletine and amiodarone -> Dr. Curt Bears (EP-Cards) d/c'd  mexilitine & statrted low dose Diltizem XT; Holter Monitor 12/2018 - HR 47-114, rate PACs/PVCs, no Afib/flutter or SVT, VT.    Reflux gastritis    STEMI involving left anterior descending coronary artery ([place]) 11/03/2004   [facility]: Occluded LAD treated with DES. Staged PCI to the RCA.   Patient Active Problem List   Diagnosis Date Noted   Multiple myeloma ([place]) 08/13/2022   Exercise intolerance 02/20/2019   Hypotension 02/22/2018   Family history of glaucoma 10/08/2017   Keratoconjunctivitis sicca of both eyes not specified as Sjogren's 10/08/2017   Other acquired hammer toe 05/27/2017   Wide-complex tachycardia - WCT ([place]) 12/09/2016   Urge incontinence 12/09/2016   Cardiac syncope 03/10/2016   Acute right-sided low back pain without sciatica 11/18/2015   BPH associated with nocturia 11/18/2015   Cardiac device in situ 11/18/2015   Gastroesophageal reflux disease without esophagitis 11/18/2015   Ingrown nail 11/18/2015   Palpitations 11/18/2015   PVD (posterior vitreous detachment), both eyes 11/18/2015   Type 2 diabetes mellitus without complication, without long-term current use of insulin ([place]) 68/12/2120   Umbilical hernia 48/25/0037   Paroxysmal VT ([place]) 09/17/2015   Personal history of other malignant neoplasm of skin 07/20/2011   Hyperlipidemia associated with type 2 diabetes mellitus ([place]) 01/26/2008   History of acute anterior wall MI 01/26/2008   ESOPHAGEAL STRICTURE 12/26/2007   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   HIATAL HERNIA 12/26/2007   DIVERTICULOSIS, COLON 12/26/2007   Coronary artery disease involving native coronary artery of native heart without angina pectoris 11/03/2004  Home Medication(s) Prior to Admission medications   Medication Sig Start Date End Date Taking? Authorizing Provider  acetaminophen (TYLENOL) 500 MG tablet Take 1,000 mg by mouth 3 (three) times daily as needed for moderate pain or headache.    [provider]   Cholecalciferol (VITAMIN D3) 1000 units CAPS Take 1,000 Units by mouth at bedtime.    [provider]  clopidogrel (PLAVIX) 75 MG tablet TAKE 1 TABLET(75 MG) BY MOUTH DAILY 11/02/19   [name], MD  Cyanocobalamin 1000 MCG TBCR Take by mouth daily. 01/13/22   [provider]  dexamethasone (DECADRON) 4 MG tablet Take 5 tabs (20 mg) weekly the day of daratumumab. Take with breakfast. 09/09/22   Volanda Napoleon, MD  diltiazem (CARDIZEM) 60 MG tablet Take 1 tablet (60 mg total) by mouth 4 (four) times daily. As needed for fast heart rate Patient not taking: Reported on 08/13/2022 01/08/21   [name], MD  diltiazem (DILT-XR) 120 MG 24 hr capsule Take 1 capsule (120 mg total) by mouth daily. 03/13/22   [name], MD  famciclovir (FAMVIR) 250 MG tablet Take 1 tablet (250 mg total) by mouth daily. 09/02/22   Volanda Napoleon, MD  fluticasone (FLONASE) 50 MCG/ACT nasal spray Place 1 spray into both nostrils daily.    [provider]  lenalidomide (REVLIMID) 15 MG capsule Take 1 capsule (15 mg total) by mouth daily. [employer] Auth # [id number]    Date Obtained 08/14/2022 09/08/22   Volanda Napoleon, MD  loratadine (CLARITIN) 10 MG tablet Take 1 tablet by mouth daily.    [provider]  montelukast (SINGULAIR) 10 MG tablet Take 1 tablet (10 mg total) by mouth at bedtime. 09/02/22   Volanda Napoleon, MD  ondansetron (ZOFRAN) 8 MG tablet Take 1 tablet (8 mg total) by mouth every 8 (eight) hours as needed for nausea or vomiting. 08/31/22   Volanda Napoleon, MD  pantoprazole (PROTONIX) 40 MG tablet Take 1 tablet (40 mg total) by mouth daily. 06/24/22   Marin Olp, MD  prochlorperazine (COMPAZINE) 10 MG tablet Take 1 tablet (10 mg total) by mouth every 6 (six) hours as needed for nausea or vomiting. 08/31/22   Volanda Napoleon, MD  rosuvastatin (CRESTOR) 20 MG tablet Take 1 tablet (20 mg total) by mouth daily. 06/24/22   Marin Olp, MD  tamsulosin  (FLOMAX) 0.4 MG CAPS capsule Take 0.4 mg by mouth daily. Dr. Yong Channel willing to refill    [provider]                                                                                                                                    Past Surgical History Past Surgical History:  Procedure Laterality Date   CARDIAC CATHETERIZATION  ~2011   [facility]: re-look Cath - patent stents   CORONARY STENT INTERVENTION N/A 01/27/2017  Procedure: Coronary Stent Intervention;  Surgeon: [name], MD;  Location: [facility]: mLAD (just after D1) overlapping prior DES -> SYNERGY DES 3X12 drug eluting stent   ELECTROPHYSIOLOGIC STUDY  2014   Unable to stimulate VT seen on loop recorder   HOLTER MONITOR  12/2018    (Off of mexiletine, only on diltiazem) heart rate ranged from 47 to 114 bpm.  Rare PACs and PVCs.  Sinus rhythm noted no A. fib or other arrhythmia.   implanted loop heart monitor x2  ~2011; 2014   Per report- batter out of date; apparently captured a prolonged episode of wide complex tachycardia associated with an episode of weakness/near syncope   INTRAVASCULAR PRESSURE WIRE/FFR STUDY N/A 01/27/2017   Procedure: Intravascular Pressure Wire/FFR Study;  Surgeon: [name], MD;  Location: [facility];  Service: Cardiovascular;  Laterality: N/A;   LEFT HEART CATH AND CORONARY ANGIOGRAPHY N/A 01/27/2017   Procedure: Left Heart Cath and Coronary Angiography;  Surgeon: [name], MD;  Location: [facility] INVASIVE CV LAB: mLAD 99% stenosis pre-Stent & ISR --> PCI. Ost DI ~50%. pRCA DES ~50-60% FFR 0.86.   NM MYOVIEW LTD  07/2014   Unremarkable EKG. No evidence of ischemia or infarct. Mild anterior attenuation, cannot exclude prior infarct, but nonreversible. EF 66%.   NM MYOVIEW LTD  12/22/2016   INTERMEDIATE RISK. EF 58%. Defect 1: Medium size severe defect in the mid anterior, apical anterior and apical wall consistent with prior anterior MI. Defect to: Large defect and  moderate severity in the basal inferoseptal basal inferior and inferoseptal mid inferior wall consistent with ischemia.   NM MYOVIEW LTD  04/05/2019   EF 55 to 65%.  Small size, mild severity fixed perfusion defect in the mid-apical anterior septal wall.  Likely represents old anterior infarct.   No evidence of ischemia. LOW RISK.    PERCUTANEOUS CORONARY STENT INTERVENTION (PCI-S)  January-February 2006   a. mLAD - Taxus DES 3.0 x 20; b. (11/06/04) staged PCI to RCA Taxus DES 3.5 x 16; c. 01/27/2017: PCI to mLAD prox to old  stent - Synergy DES 3.0 x 12   TRANSTHORACIC ECHOCARDIOGRAM  10/11/2020    [facility]):  Normal LV size & function. EF 55-60%. NO RWMA. Normal filling parameters.  Mild TR however estimated moderate pulmonary hypertension with RVSP 52 mmHg.  IVC is mildly dilated.  No vegetations noted.  No significant change from last study.   TRANSTHORACIC ECHOCARDIOGRAM  05/2020   [facility]: 05/21/2020: Normal LV size and function.  Normal valves.  No effusion.; 06/04/2020: Normal LV size and function EF 60 to 65%.  Trace MR and TR.  No pulmonary hypertension.  Stable   UMBILICAL HERNIA REPAIR     Family History Family History  Problem Relation Age of Onset   Uterine cancer Mother    Multiple myeloma Mother    Heart disease Mother    Prostate cancer Father    Cancer Father        sarcoma   Macular degeneration Father    Heart attack Brother    Glaucoma Paternal Aunt    Colon cancer Neg Hx    Esophageal cancer Neg Hx    Rectal cancer Neg Hx    Stomach cancer Neg Hx     Social History Social History   Tobacco Use   Smoking status: Never   Smokeless tobacco: Never  Vaping Use   Vaping Use: Never used  Substance Use  Topics   Alcohol use: No   Drug use: No   Allergies Silver sulfadiazine, Latex, Adhesive [tape], and Hydrocodone  Review of Systems Review of Systems  All other systems reviewed and are negative.   Physical Exam Vital Signs  I have reviewed the  triage vital signs BP (!) 105/59   Pulse 60   Temp 98.9 F (37.2 C)   Resp 19   Wt 87.5 kg   SpO2 91%   BMI 26.18 kg/m  Physical Exam Vitals and nursing note reviewed.  Constitutional:      General: He is not in acute distress.    Appearance: Normal appearance.  HENT:     Mouth/Throat:     Mouth: Mucous membranes are moist.  Eyes:     Conjunctiva/sclera: Conjunctivae normal.  Cardiovascular:     Rate and Rhythm: Normal rate and regular rhythm.  Pulmonary:     Effort: Pulmonary effort is normal. No respiratory distress.     Breath sounds: Normal breath sounds.  Abdominal:     General: Abdomen is flat. There is distension.     Palpations: Abdomen is soft.     Tenderness: There is generalized abdominal tenderness.  Musculoskeletal:     Right lower leg: No edema.     Left lower leg: No edema.  Skin:    General: Skin is warm and dry.     Capillary Refill: Capillary refill takes less than 2 seconds.  Neurological:     Mental Status: He is alert and oriented to person, place, and time. Mental status is at baseline.  Psychiatric:        Mood and Affect: Mood normal.        Behavior: Behavior normal.     ED Results and Treatments Labs (all labs ordered are listed, but only abnormal results are displayed) Labs Reviewed  LACTIC ACID, PLASMA                                                                                                                          Radiology DG Abd 2 Views  Result Date: 09/21/2022 CLINICAL DATA:  Nausea, vomiting, constipation EXAM: ABDOMEN - 2 VIEW COMPARISON:  None Available. FINDINGS: Numerous distended, air and fluid filled loops of small bowel in the central abdomen, measuring up to 5.9 cm in caliber. Colon is nondistended with gas and stool present to the rectum. No free air in the abdomen. Osseous structures unremarkable. IMPRESSION: Numerous distended, air and fluid-filled loops of small bowel in the central abdomen, concerning for small  bowel obstruction. Consider CT to further evaluate. No free air in the abdomen. Electronically Signed   By: Delanna Ahmadi M.D.   On: 09/21/2022 12:59    Pertinent labs & imaging results that were available during my care of the patient were reviewed by me and considered in my medical decision making (see MDM for details).  Medications Ordered in ED Medications - No data to display  Procedures Procedures  (including critical care time)  Medical Decision Making / ED Course   MDM:  81 year old male presenting for abdominal pain nausea and vomiting.  Patient generally well-appearing with reassuring vital signs.  Labs from earlier today notable for stable chronic anemia, mild AKI.  Reviewed x-ray which shows loops of bowel concerning for small bowel obstruction.  Will obtain CT scan to further evaluate.  Could also represent volvulus, intussusception.  Signed out to oncoming physician pending CT scan to further evaluate.  Patient received fluids in the infusion center and appears well-hydrated and denies any symptoms at this time.  Clinical Course as of 09/21/22 1558  Mon Sep 21, 2022  1541 Stable 81 YOM with abdominal pain. Sent down by oncology clinic for Eval for SBO. CT AP and poor PO intake. Likely admit.   [CC]    Clinical Course User Index [CC] Tretha Sciara, MD     Additional history obtained: -Additional history obtained from family and ems -External records from outside source obtained and reviewed including: Chart review including previous notes, labs, imaging, consultation notes including notes from Dr. Martha Clan today   Lab Tests: -I ordered, reviewed, and interpreted labs.   The pertinent results include:   Labs Reviewed  LACTIC ACID, PLASMA    Notable for normal lactate   Medicines ordered and prescription drug management: No  orders of the defined types were placed in this encounter.   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 Ant Stemi (11/03/04) mLAD - PCI: Taxus DES 3.0 x 20; b. (11/06/04) staged RCA DES PCI: Taxus DES 3.5 x 16; c. (01/2017) High Risk Myoview (~fixed anterior defect with normal WM) @ [facility] (Dr. [name]) Synergy DES 3.0 x 12 (for 99% lesion @ prox edge of old stent   Cardiac syncope    No episode since starting flecainide. Had documented WIDE COMPLEX Tachycardia on loop recorder.   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 Unable to induce during EP study.;  Due to CAD, flecainide discontinued -> intolerant of both mexiletine and amiodarone -> Dr. Curt Bears (EP-Cards) d/c'd mexilitine & statrted low dose Diltizem XT; Holter Monitor 12/2018 - HR 47-114, rate PACs/PVCs, no Afib/flutter or SVT, VT.    Reflux gastritis    STEMI involving left anterior descending coronary artery ([place]) 11/03/2004   [facility]: Occluded LAD treated with DES. Staged PCI to the RCA.      Dispostion: Disposition decision including need for hospitalization was considered, and patient disposition pending at sign out.    Final Clinical Impression(s) / ED Diagnoses Final diagnoses:  Abdominal distension     This chart was dictated using voice recognition software.  Despite best efforts to proofread,  errors can occur which can change the documentation meaning.     Cristie Hem, MD 09/21/22 [phone]

## 2022-09-21 NOTE — Progress Notes (Signed)
1530-Pt taken to the ER via w/c by NT.  Report called to ER MD by Dr. Marin Olp.

## 2022-09-21 NOTE — ED Notes (Signed)
Pt alarm on monitor keeps showing brady. Strip printed and shown to [name], states normal for pt d/t medications. States pt most likely bradys when sleeping

## 2022-09-21 NOTE — H&P (Signed)
History and Physical  Alexander Armstrong TMH:[id number] DOB: 1941/05/15 DOA: 09/21/2022  Referring physician: Dr. Doren Custard, Alexander Armstrong. PCP: Marin Olp, MD  Outpatient Specialists: Oncology. Patient coming from: Home.  Chief Complaint: Abdominal pain.  HPI: Alexander Armstrong is a 81 y.o. male with medical history significant for IgG kappa multiple myeloma underwent treatment 14 days ago, followed by Dr. Marin Olp, coronary artery disease status post PCI, type 2 diabetes, hyperlipidemia, orthostatic hypotension, wide-complex tachycardia, who initially presented to [facility] ED with complaints of abdominal pain.  Had not had a bowel movement in 7 days.  Due to concern for incarcerated umbilical hernia the patient was transferred to [facility], ED to ED.  He was seen by general surgery in the ED at [facility].  Per general surgery no evidence of umbilical incarceration.  CT abdomen pelvis with contrast revealed findings suggestive of partial small bowel obstruction.  Surgery recommended admission to hospitalist service for further management.  Received 1 L IV fluid bolus.  Admitted by [facility], hospitalist service.  At the time of this visit the patient's symptoms had improved.  Denies nausea at this time.  Had positive flatulence and was requesting a diet.  Started on clear liquid diet.  ED Course: Tmax 98.9.  BP 110/52, pulse 69, respiration rate 17, O2 saturation 98% on room air.  Lab studies remarkable for serum sodium 131, potassium 3.2, glucose 168, BUN 44, creatinine 1.18, hemoglobin 8.5, WBC 3.3.  Platelet count 103.  Review of Systems: Review of systems as noted in the HPI. All other systems reviewed and are negative.   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 Ant Stemi (11/03/04) mLAD - PCI: Taxus DES 3.0 x 20; b. (11/06/04) staged RCA DES PCI: Taxus  DES 3.5 x 16; c. (01/2017) High Risk Myoview (~fixed anterior defect with normal WM) @ [facility] (Dr. [name]) Synergy DES 3.0 x 12 (for 99% lesion @ prox edge of old stent   Cardiac syncope    No episode since starting flecainide. Had documented WIDE COMPLEX Tachycardia on loop recorder.   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 Unable to induce during EP study.;  Due to CAD, flecainide discontinued -> intolerant of both mexiletine and amiodarone -> Dr. Curt Bears (EP-Cards) d/c'd mexilitine & statrted low dose Diltizem XT; Holter Monitor 12/2018 - HR 47-114, rate PACs/PVCs, no Afib/flutter or SVT, VT.    Reflux gastritis    STEMI involving left anterior descending coronary artery ([place]) 11/03/2004   [facility]: Occluded LAD treated with DES. Staged PCI to the RCA.   Past Surgical History:  Procedure Laterality Date   CARDIAC CATHETERIZATION  ~2011   [facility]: re-look Cath - patent stents   CORONARY STENT INTERVENTION N/A 01/27/2017   Procedure: Coronary Stent Intervention;  Surgeon: [name], MD;  Location: [place] CV LAB: mLAD (just after D1) overlapping prior DES -> SYNERGY DES 3X12 drug eluting stent   ELECTROPHYSIOLOGIC STUDY  2014   Unable to stimulate VT seen on loop recorder   HOLTER MONITOR  12/2018    (Off of  mexiletine, only on diltiazem) heart rate ranged from 47 to 114 bpm.  Rare PACs and PVCs.  Sinus rhythm noted no A. fib or other arrhythmia.   implanted loop heart monitor x2  ~2011; 2014   Per report- batter out of date; apparently captured a prolonged episode of wide complex tachycardia associated with an episode of weakness/near syncope   INTRAVASCULAR PRESSURE WIRE/FFR STUDY N/A 01/27/2017   Procedure:  Intravascular Pressure Wire/FFR Study;  Surgeon: [name], MD;  Location: [place] CV LAB;  Service: Cardiovascular;  Laterality: N/A;   LEFT HEART CATH AND CORONARY ANGIOGRAPHY N/A 01/27/2017   Procedure: Left Heart Cath and Coronary Angiography;  Surgeon: [name], MD;  Location: [facility] INVASIVE CV LAB: mLAD 99% stenosis pre-Stent & ISR --> PCI. Ost DI ~50%. pRCA DES ~50-60% FFR 0.86.   NM MYOVIEW LTD  07/2014   Unremarkable EKG. No evidence of ischemia or infarct. Mild anterior attenuation, cannot exclude prior infarct, but nonreversible. EF 66%.   NM MYOVIEW LTD  12/22/2016   INTERMEDIATE RISK. EF 58%. Defect 1: Medium size severe defect in the mid anterior, apical anterior and apical wall consistent with prior anterior MI. Defect to: Large defect and moderate severity in the basal inferoseptal basal inferior and inferoseptal mid inferior wall consistent with ischemia.   NM MYOVIEW LTD  04/05/2019   EF 55 to 65%.  Small size, mild severity fixed perfusion defect in the mid-apical anterior septal wall.  Likely represents old anterior infarct.   No evidence of ischemia. LOW RISK.    PERCUTANEOUS CORONARY STENT INTERVENTION (PCI-S)  January-February 2006   a. mLAD - Taxus DES 3.0 x 20; b. (11/06/04) staged PCI to RCA Taxus DES 3.5 x 16; c. 01/27/2017: PCI to mLAD prox to old  stent - Synergy DES 3.0 x 12   TRANSTHORACIC ECHOCARDIOGRAM  10/11/2020    [facility]):  Normal LV size & function. EF 55-60%. NO RWMA. Normal filling parameters.  Mild TR however estimated moderate pulmonary hypertension with RVSP 52 mmHg.  IVC is mildly dilated.  No vegetations noted.  No significant change from last study.   TRANSTHORACIC ECHOCARDIOGRAM  05/2020   [facility]: 05/21/2020: Normal LV size and function.  Normal valves.  No effusion.; 06/04/2020: Normal LV size and function EF 60 to 65%.  Trace MR and TR.  No pulmonary hypertension.  Stable   UMBILICAL HERNIA REPAIR      Social History:  reports  that he has never smoked. He has never used smokeless tobacco. He reports that he does not drink alcohol and does not use drugs.   Allergies  Allergen Reactions   Silver Sulfadiazine Hives and Rash   Latex Other (See Comments)    "takes the skin off"   Adhesive [Tape] Rash   Hydrocodone Rash    Family History  Problem Relation Age of Onset   Uterine cancer Mother    Multiple myeloma Mother    Heart disease Mother    Prostate cancer Father    Cancer Father        sarcoma   Macular degeneration Father    Heart attack Brother    Glaucoma Paternal Aunt    Colon cancer Neg Hx    Esophageal cancer Neg Hx    Rectal cancer Neg Hx    Stomach cancer Neg Hx       Prior to Admission medications   Medication Sig Start Date End Date Taking? Authorizing Provider  acetaminophen (TYLENOL) 500  MG tablet Take 1,000 mg by mouth 3 (three) times daily as needed for moderate pain or headache.    [provider]  Cholecalciferol (VITAMIN D3) 1000 units CAPS Take 1,000 Units by mouth at bedtime.    [provider]  clopidogrel (PLAVIX) 75 MG tablet TAKE 1 TABLET(75 MG) BY MOUTH DAILY 11/02/19   [name], MD  Cyanocobalamin 1000 MCG TBCR Take by mouth daily. 01/13/22   [provider]  dexamethasone (DECADRON) 4 MG tablet Take 5 tabs (20 mg) weekly the day of daratumumab. Take with breakfast. 09/09/22   Volanda Napoleon, MD  diltiazem (CARDIZEM) 60 MG tablet Take 1 tablet (60 mg total) by mouth 4 (four) times daily. As needed for fast heart rate Patient not taking: Reported on 08/13/2022 01/08/21   [name], MD  diltiazem (DILT-XR) 120 MG 24 hr capsule Take 1 capsule (120 mg total) by mouth daily. 03/13/22   [name], MD  famciclovir (FAMVIR) 250 MG tablet Take 1 tablet (250 mg total) by mouth daily. 09/02/22   Volanda Napoleon, MD  fluticasone (FLONASE) 50 MCG/ACT nasal spray Place 1 spray into both nostrils daily.    [provider]  lenalidomide  (REVLIMID) 15 MG capsule Take 1 capsule (15 mg total) by mouth daily. [employer] Auth # [id number]    Date Obtained 08/14/2022 09/08/22   Volanda Napoleon, MD  loratadine (CLARITIN) 10 MG tablet Take 1 tablet by mouth daily.    [provider]  montelukast (SINGULAIR) 10 MG tablet Take 1 tablet (10 mg total) by mouth at bedtime. 09/02/22   Volanda Napoleon, MD  ondansetron (ZOFRAN) 8 MG tablet Take 1 tablet (8 mg total) by mouth every 8 (eight) hours as needed for nausea or vomiting. 08/31/22   Volanda Napoleon, MD  pantoprazole (PROTONIX) 40 MG tablet Take 1 tablet (40 mg total) by mouth daily. 06/24/22   Marin Olp, MD  prochlorperazine (COMPAZINE) 10 MG tablet Take 1 tablet (10 mg total) by mouth every 6 (six) hours as needed for nausea or vomiting. 08/31/22   Volanda Napoleon, MD  rosuvastatin (CRESTOR) 20 MG tablet Take 1 tablet (20 mg total) by mouth daily. 06/24/22   Marin Olp, MD  tamsulosin (FLOMAX) 0.4 MG CAPS capsule Take 0.4 mg by mouth daily. Dr. Yong Channel willing to refill    [provider]    Physical Exam: BP 113/68 (BP Location: Right Arm)   Pulse 60   Temp 98.1 F (36.7 C) (Oral)   Resp 16   Wt 87.5 kg   SpO2 97%   BMI 26.18 kg/m   General: 81 y.o. year-old male well developed well nourished in no acute distress.  Alert and oriented x3. Cardiovascular: Regular rate and rhythm with no rubs or gallops.  No thyromegaly or JVD noted.  No lower extremity edema. 2/4 pulses in all 4 extremities. Respiratory: Clear to auscultation with no wheezes or rales. Good inspiratory effort. Abdomen: Soft nontender nondistended with normal bowel sounds x4 quadrants. Muskuloskeletal: No cyanosis, clubbing or edema noted bilaterally Neuro: CN II-XII intact, strength, sensation, reflexes Skin: No ulcerative lesions noted or rashes Psychiatry: Judgement and insight appear normal. Mood is appropriate for condition and setting          Labs on Admission:  Basic  Metabolic Panel: Recent Labs  Lab 09/21/22 1111  NA 132*  K 3.9  CL 96*  CO2 27  GLUCOSE 184*  BUN 52*  CREATININE  1.60*  CALCIUM 8.3*   Liver Function Tests: Recent Labs  Lab 09/21/22 1111  AST 13*  ALT 18  ALKPHOS 59  BILITOT 0.9  PROT 6.9  ALBUMIN 3.8   No results for input(s): "LIPASE", "AMYLASE" in the last 168 hours. No results for input(s): "AMMONIA" in the last 168 hours. CBC: Recent Labs  Lab 09/21/22 1111  WBC 4.8  NEUTROABS 1.8  HGB 10.5*  HCT 31.3*  MCV 92.1  PLT 127*   Cardiac Enzymes: No results for input(s): "CKTOTAL", "CKMB", "CKMBINDEX", "TROPONINI" in the last 168 hours.  BNP (last 3 results) No results for input(s): "BNP" in the last 8760 hours.  ProBNP (last 3 results) No results for input(s): "PROBNP" in the last 8760 hours.  CBG: No results for input(s): "GLUCAP" in the last 168 hours.  Radiological Exams on Admission: CT Abdomen Pelvis W Contrast  Result Date: 09/21/2022 CLINICAL DATA:  Nausea, vomiting, abdominal distention EXAM: CT ABDOMEN AND PELVIS WITH CONTRAST TECHNIQUE: Multidetector CT imaging of the abdomen and pelvis was performed using the standard protocol following bolus administration of intravenous contrast. RADIATION DOSE REDUCTION: This exam was performed according to the departmental dose-optimization program which includes automated exposure control, adjustment of the mA and/or kV according to patient size and/or use of iterative reconstruction technique. CONTRAST:  85m OMNIPAQUE IOHEXOL 300 MG/ML  SOLN COMPARISON:  Abdomen radiographs done today FINDINGS: Lower chest: Small linear patchy infiltrates are seen in both lower lung fields, more so in the left lower lobe. Coronary artery calcifications are seen. Hepatobiliary: No focal abnormalities are seen in liver. There is no dilation of bile ducts. Gallbladder is distended. There is no wall thickening in gallbladder. Pancreas: No focal abnormalities are seen. Spleen:  Unremarkable. Adrenals/Urinary Tract: Adrenals are unremarkable. There is no hydronephrosis. There are no renal or ureteral stones. Urinary bladder is unremarkable. Stomach/Bowel: Small hiatal hernia is seen. Stomach is unremarkable. There is abnormal dilation of proximal small bowel loops measuring up to 5 cm in diameter. Distal small bowel loops are decompressed. There is umbilical hernia containing short segment of a small bowel loop. There is mild edema in the fat planes within the umbilical hernia. Small bowel loops in the left side of the abdomen proximal to the hernia appear to be dilated and small bowel loops in the right side of abdomen distal to the hernia appear to be partly decompressed. Appendix is not dilated. There is no significant wall thickening in colon. Multiple diverticula are seen in left colon without signs of focal acute diverticulitis. Vascular/Lymphatic: Scattered arterial calcifications are seen in aorta and its major branches. Reproductive: Coarse calcification is seen in prostate. Other: There is no ascites or pneumoperitoneum. Right inguinal hernia containing fat is seen. Musculoskeletal: Schmorl's node is seen in the upper endplate of body of L2 vertebra. Degenerative changes are noted with encroachment of neural foramina at multiple levels, more so at L4-L5 and L5-S1 levels. IMPRESSION: There is abnormal dilation of proximal small bowel loops with decompression of distal small bowel loops. There is umbilical hernia containing short segment of small bowel loop. Findings suggest partial obstruction of small bowel related to incarcerated obstructing umbilical hernia. Surgical consultation should be considered. There is no hydronephrosis. Appendix is not dilated. 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 Small hiatal hernia. There are linear densities in the lower lung fields suggesting subsegmental atelectasis. Minimal right pleural effusion. Coronary artery disease.  Aortic arteriosclerosis. Imaging findings were relayed to patient's provider in the emergency room by  telephone call. Electronically Signed   By: Elmer Picker M.D.   On: 09/21/2022 16:53   DG Abd 2 Views  Result Date: 09/21/2022 CLINICAL DATA:  Nausea, vomiting, constipation EXAM: ABDOMEN - 2 VIEW COMPARISON:  None Available. FINDINGS: Numerous distended, air and fluid filled loops of small bowel in the central abdomen, measuring up to 5.9 cm in caliber. Colon is nondistended with gas and stool present to the rectum. No free air in the abdomen. Osseous structures unremarkable. IMPRESSION: Numerous distended, air and fluid-filled loops of small bowel in the central abdomen, concerning for small bowel obstruction. Consider CT to further evaluate. No free air in the abdomen. Electronically Signed   By: Delanna Ahmadi M.D.   On: 09/21/2022 12:59    EKG: I independently viewed the EKG done and my findings are as followed: Sinus rhythm rate of 58.  Nonspecific ST-T changes.  QTc 471.  Assessment/Plan Present on Admission:  Partial small bowel obstruction (HCC)  Principal Problem:   Partial small bowel obstruction (HCC)  Partial small bowel obstruction IV fluid hydration Optimize potassium and magnesium levels Mobilize as tolerated  Constipation Bowel regimen Senokot 2 tablet twice daily Dulcolax suppository  Hypokalemia Serum potassium 3.2 Goal potassium greater than 4.0 Repleted intravenously  Multiple myeloma Resume home regimen Follows with Dr. Marin Olp.  GERD Resume home PPI  BPH Resume home tamsulosin Monitor urine output   DVT prophylaxis: Subcu Lovenox daily  Code Status: Full code  Family Communication: None at bedside  Disposition Plan: Admitted to telemetry unit  Consults called: None.  Admission status: Inpatient status.   Status is: Inpatient The patient requires at least 2 midnights for further evaluation and treatment of present  condition.   Kayleen Memos MD [facility] Pager [phone]  If 7PM-7AM, please contact night-coverage [email or web address] Password [facility]  09/21/2022, 9:25 PM

## 2022-09-22 ENCOUNTER — Other Ambulatory Visit: Payer: Self-pay

## 2022-09-22 ENCOUNTER — Ambulatory Visit: Payer: Medicare Other | Admitting: Physical Therapy

## 2022-09-22 DIAGNOSIS — K56600 Partial intestinal obstruction, unspecified as to cause: Secondary | ICD-10-CM | POA: Diagnosis not present

## 2022-09-22 LAB — BASIC METABOLIC PANEL
Anion gap: 7 (ref 5–15)
BUN: 44 mg/dL — ABNORMAL HIGH (ref 8–23)
CO2: 20 mmol/L — ABNORMAL LOW (ref 22–32)
Calcium: 7.4 mg/dL — ABNORMAL LOW (ref 8.9–10.3)
Chloride: 104 mmol/L (ref 98–111)
Creatinine, Ser: 1.18 mg/dL (ref 0.61–1.24)
GFR, Estimated: >60 mL/min (ref >60)
Glucose, Bld: 168 mg/dL — ABNORMAL HIGH (ref 70–99)
Potassium: 3.2 mmol/L — ABNORMAL LOW (ref 3.5–5.1)
Sodium: 131 mmol/L — ABNORMAL LOW (ref 135–145)

## 2022-09-22 LAB — CBC
HCT: 25.5 % — ABNORMAL LOW (ref 39.0–52.0)
Hemoglobin: 8.5 g/dL — ABNORMAL LOW (ref 13.0–17.0)
MCH: 31.1 pg (ref 26.0–34.0)
MCHC: 33.3 g/dL (ref 30.0–36.0)
MCV: 93.4 fL (ref 80.0–100.0)
Platelets: 103 10*3/uL — ABNORMAL LOW (ref 150–400)
RBC: 2.73 MIL/uL — ABNORMAL LOW (ref 4.22–5.81)
RDW: 13.1 % (ref 11.5–15.5)
WBC: 3.3 10*3/uL — ABNORMAL LOW (ref 4.0–10.5)
nRBC: 0 % (ref 0.0–0.2)

## 2022-09-22 LAB — MAGNESIUM: Magnesium: 1.9 mg/dL (ref 1.7–2.4)

## 2022-09-22 MED ORDER — SENNOSIDES-DOCUSATE SODIUM 8.6-50 MG PO TABS
2.0000 | ORAL_TABLET | Freq: Two times a day (BID) | ORAL | Status: AC
  Administered 2022-09-22: 2 via ORAL
  Filled 2022-09-22 (×2): qty 2

## 2022-09-22 MED ORDER — VALACYCLOVIR HCL 500 MG PO TABS
1000.0000 mg | ORAL_TABLET | Freq: Every day | ORAL | Status: DC
  Administered 2022-09-22 – 2022-09-23 (×2): 1000 mg via ORAL
  Filled 2022-09-22 (×2): qty 2

## 2022-09-22 MED ORDER — CLOPIDOGREL BISULFATE 75 MG PO TABS
75.0000 mg | ORAL_TABLET | Freq: Every day | ORAL | Status: DC
  Administered 2022-09-22 – 2022-09-23 (×2): 75 mg via ORAL
  Filled 2022-09-22 (×2): qty 1

## 2022-09-22 MED ORDER — POTASSIUM CHLORIDE 10 MEQ/100ML IV SOLN
10.0000 meq | INTRAVENOUS | Status: AC
  Administered 2022-09-22 (×2): 10 meq via INTRAVENOUS
  Filled 2022-09-22 (×2): qty 100

## 2022-09-22 MED ORDER — MONTELUKAST SODIUM 10 MG PO TABS
10.0000 mg | ORAL_TABLET | Freq: Every day | ORAL | Status: DC
  Administered 2022-09-22: 10 mg via ORAL
  Filled 2022-09-22: qty 1

## 2022-09-22 MED ORDER — TAMSULOSIN HCL 0.4 MG PO CAPS
0.4000 mg | ORAL_CAPSULE | Freq: Every day | ORAL | Status: DC
  Administered 2022-09-22: 0.4 mg via ORAL
  Filled 2022-09-22: qty 1

## 2022-09-22 MED ORDER — PANTOPRAZOLE SODIUM 40 MG PO TBEC
40.0000 mg | DELAYED_RELEASE_TABLET | Freq: Every day | ORAL | Status: DC
  Administered 2022-09-22 – 2022-09-23 (×2): 40 mg via ORAL
  Filled 2022-09-22 (×2): qty 1

## 2022-09-22 MED ORDER — ROSUVASTATIN CALCIUM 20 MG PO TABS
20.0000 mg | ORAL_TABLET | Freq: Every day | ORAL | Status: DC
  Administered 2022-09-22 – 2022-09-23 (×2): 20 mg via ORAL
  Filled 2022-09-22 (×2): qty 1

## 2022-09-22 MED ORDER — POTASSIUM CHLORIDE CRYS ER 20 MEQ PO TBCR
20.0000 meq | EXTENDED_RELEASE_TABLET | Freq: Two times a day (BID) | ORAL | Status: DC
  Administered 2022-09-22 – 2022-09-23 (×3): 20 meq via ORAL
  Filled 2022-09-22 (×3): qty 1

## 2022-09-22 MED ORDER — BISACODYL 10 MG RE SUPP
10.0000 mg | Freq: Once | RECTAL | Status: AC
  Administered 2022-09-22: 10 mg via RECTAL
  Filled 2022-09-22: qty 1

## 2022-09-22 MED ORDER — LENALIDOMIDE 15 MG PO CAPS: 15.0000 mg | ORAL_CAPSULE | Freq: Every day | ORAL | Status: DC

## 2022-09-22 MED ORDER — ENOXAPARIN SODIUM 40 MG/0.4ML IJ SOSY
40.0000 mg | PREFILLED_SYRINGE | Freq: Every day | INTRAMUSCULAR | Status: DC
  Administered 2022-09-22: 40 mg via SUBCUTANEOUS
  Filled 2022-09-22 (×2): qty 0.4

## 2022-09-22 NOTE — ED Notes (Signed)
Pt requested water which was given

## 2022-09-22 NOTE — Progress Notes (Signed)
PROGRESS NOTE    Alexander Armstrong  WLN:[id number] DOB: April 01, 1941 DOA: 09/21/2022 PCP: Marin Olp, MD    Brief Narrative:  81 year old with history of multiple myeloma on chemotherapy, coronary artery disease status post PCI, type 2 diabetes and hyperlipidemia admitted with abdominal pain ongoing for about a week after not having bowel movement since then.  Has history of umbilical hernia repair.  CT scan suggestive of partial small bowel obstruction.  He had persistent symptoms so admitted to the hospital.  Hemodynamically stable.   Assessment & Plan:   Partial small bowel obstruction due to adhesions: Reducible umbilical hernia:  Adequate IV fluids, serial abdominal exam, challenge with clears.  Dulcolax suppository.  Senokot.  Followed by surgery.  Anticipating conservative management.  Hypokalemia: Replaced IV.  Further keep on a scheduled replacement.  Magnesium is adequate.  Coronary artery disease: Stable.  Continue on Plavix.  Umbilical hernia: Without complication.  May need surgical repair after clinical improvement.    DVT prophylaxis: enoxaparin (LOVENOX) injection 40 mg Start: 09/22/22 1000   Code Status: Full code Family Communication: None at the bedside Disposition Plan: Status is: Inpatient Remains inpatient appropriate because: Significant abdomen distention     Consultants:  General surgery  Procedures:  None  Antimicrobials:  None   Subjective: Patient seen and examined.  Denies any nausea vomiting but continues to have abdominal distention.  Passing flatus but no bowel movement.  Objective: Vitals:   09/22/22 0625 09/22/22 0740 09/22/22 1030 09/22/22 1141  BP:  (!) 108/58 135/63   Pulse:  80 75   Resp:  16 15   Temp: 98.5 F (36.9 C) (!) 97.3 F (36.3 C)  97.6 F (36.4 C)  TempSrc: Oral Oral  Oral  SpO2:  97% 99%   Weight:       No intake or output data in the 24 hours ending 09/22/22 1356 Filed Weights   09/21/22 1404  09/21/22 1405  Weight: 92.5 kg 87.5 kg    Examination:  General exam: Appears calm and comfortable at rest.  Appropriately anxious. Respiratory system: Clear to auscultation. Respiratory effort normal.  No added sounds. Cardiovascular system: S1 & S2 heard, RRR.  Gastrointestinal system: Distended.  Reducible umbilical hernia with palpable defect. Central nervous system: Alert and oriented. No focal neurological deficits.     Data Reviewed: I have personally reviewed following labs and imaging studies  CBC: Recent Labs  Lab 09/21/22 1111 09/22/22 0500  WBC 4.8 3.3*  NEUTROABS 1.8  --   HGB 10.5* 8.5*  HCT 31.3* 25.5*  MCV 92.1 93.4  PLT 127* 740*   Basic Metabolic Panel: Recent Labs  Lab 09/21/22 1111 09/22/22 0500  NA 132* 131*  K 3.9 3.2*  CL 96* 104  CO2 27 20*  GLUCOSE 184* 168*  BUN 52* 44*  CREATININE 1.60* 1.18  CALCIUM 8.3* 7.4*  MG  --  1.9   GFR: Estimated Creatinine Clearance: 53.9 mL/min (by C-G formula based on SCr of 1.18 mg/dL). Liver Function Tests: Recent Labs  Lab 09/21/22 1111  AST 13*  ALT 18  ALKPHOS 59  BILITOT 0.9  PROT 6.9  ALBUMIN 3.8   No results for input(s): "LIPASE", "AMYLASE" in the last 168 hours. No results for input(s): "AMMONIA" in the last 168 hours. Coagulation Profile: No results for input(s): "INR", "PROTIME" in the last 168 hours. Cardiac Enzymes: No results for input(s): "CKTOTAL", "CKMB", "CKMBINDEX", "TROPONINI" in the last 168 hours. BNP (last 3 results) No results for input(s): "PROBNP"  in the last 8760 hours. HbA1C: No results for input(s): "HGBA1C" in the last 72 hours. CBG: No results for input(s): "GLUCAP" in the last 168 hours. Lipid Profile: No results for input(s): "CHOL", "HDL", "LDLCALC", "TRIG", "CHOLHDL", "LDLDIRECT" in the last 72 hours. Thyroid Function Tests: No results for input(s): "TSH", "T4TOTAL", "FREET4", "T3FREE", "THYROIDAB" in the last 72 hours. Anemia Panel: No results for  input(s): "VITAMINB12", "FOLATE", "FERRITIN", "TIBC", "IRON", "RETICCTPCT" in the last 72 hours. Sepsis Labs: Recent Labs  Lab 09/21/22 1443  LATICACIDVEN 0.9    No results found for this or any previous visit (from the past 240 hour(s)).       Radiology Studies: CT Abdomen Pelvis W Contrast  Result Date: 09/21/2022 CLINICAL DATA:  Nausea, vomiting, abdominal distention EXAM: CT ABDOMEN AND PELVIS WITH CONTRAST TECHNIQUE: Multidetector CT imaging of the abdomen and pelvis was performed using the standard protocol following bolus administration of intravenous contrast. RADIATION DOSE REDUCTION: This exam was performed according to the departmental dose-optimization program which includes automated exposure control, adjustment of the mA and/or kV according to patient size and/or use of iterative reconstruction technique. CONTRAST:  109m OMNIPAQUE IOHEXOL 300 MG/ML  SOLN COMPARISON:  Abdomen radiographs done today FINDINGS: Lower chest: Small linear patchy infiltrates are seen in both lower lung fields, more so in the left lower lobe. Coronary artery calcifications are seen. Hepatobiliary: No focal abnormalities are seen in liver. There is no dilation of bile ducts. Gallbladder is distended. There is no wall thickening in gallbladder. Pancreas: No focal abnormalities are seen. Spleen: Unremarkable. Adrenals/Urinary Tract: Adrenals are unremarkable. There is no hydronephrosis. There are no renal or ureteral stones. Urinary bladder is unremarkable. Stomach/Bowel: Small hiatal hernia is seen. Stomach is unremarkable. There is abnormal dilation of proximal small bowel loops measuring up to 5 cm in diameter. Distal small bowel loops are decompressed. There is umbilical hernia containing short segment of a small bowel loop. There is mild edema in the fat planes within the umbilical hernia. Small bowel loops in the left side of the abdomen proximal to the hernia appear to be dilated and small bowel loops in  the right side of abdomen distal to the hernia appear to be partly decompressed. Appendix is not dilated. There is no significant wall thickening in colon. Multiple diverticula are seen in left colon without signs of focal acute diverticulitis. Vascular/Lymphatic: Scattered arterial calcifications are seen in aorta and its major branches. Reproductive: Coarse calcification is seen in prostate. Other: There is no ascites or pneumoperitoneum. Right inguinal hernia containing fat is seen. Musculoskeletal: Schmorl's node is seen in the upper endplate of body of L2 vertebra. Degenerative changes are noted with encroachment of neural foramina at multiple levels, more so at L4-L5 and L5-S1 levels. IMPRESSION: There is abnormal dilation of proximal small bowel loops with decompression of distal small bowel loops. There is umbilical hernia containing short segment of small bowel loop. Findings suggest partial obstruction of small bowel related to incarcerated obstructing umbilical hernia. Surgical consultation should be considered. There is no hydronephrosis. Appendix is not dilated. 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 Small hiatal hernia. There are linear densities in the lower lung fields suggesting subsegmental atelectasis. Minimal right pleural effusion. Coronary artery disease. Aortic arteriosclerosis. Imaging findings were relayed to patient's provider in the emergency room by telephone call. Electronically Signed   By: PElmer PickerM.D.   On: 09/21/2022 16:53   DG Abd 2 Views  Result Date: 09/21/2022 CLINICAL DATA:  Nausea, vomiting,  constipation EXAM: ABDOMEN - 2 VIEW COMPARISON:  None Available. FINDINGS: Numerous distended, air and fluid filled loops of small bowel in the central abdomen, measuring up to 5.9 cm in caliber. Colon is nondistended with gas and stool present to the rectum. No free air in the abdomen. Osseous structures unremarkable. IMPRESSION: Numerous distended,  air and fluid-filled loops of small bowel in the central abdomen, concerning for small bowel obstruction. Consider CT to further evaluate. No free air in the abdomen. Electronically Signed   By: Delanna Ahmadi M.D.   On: 09/21/2022 12:59        Scheduled Meds:  clopidogrel  75 mg Oral Daily   enoxaparin (LOVENOX) injection  40 mg Subcutaneous Daily   melatonin  3 mg Oral QHS   montelukast  10 mg Oral QHS   pantoprazole  40 mg Oral Daily   rosuvastatin  20 mg Oral Daily   senna-docusate  2 tablet Oral BID   tamsulosin  0.4 mg Oral QHS   valACYclovir  1,000 mg Oral Daily   Continuous Infusions:  sodium chloride 125 mL/hr at 09/21/22 2249     LOS: 1 day    Time spent: 35 minutes    Barb Merino, MD [facility] Pager [phone]

## 2022-09-22 NOTE — ED Notes (Signed)
Assumed care of pt, he is alert and oriented at this time.  He fully understanding the situation and has no questions at this time.  Pt expresses no needs at this time.  He states he has no pain at this time.

## 2022-09-22 NOTE — Progress Notes (Addendum)
Progress Note     Subjective: No abdominal pain, nausea, or emesis today. Passing flatus. Still very distended and no BM for several days  Objective: Vital signs in last 24 hours: Temp:  [97.3 F (36.3 C)-98.9 F (37.2 C)] 97.3 F (36.3 C) (12/19 0740) Pulse Rate:  [56-81] 75 (12/19 1030) Resp:  [15-24] 15 (12/19 1030) BP: (101-135)/(54-79) 135/63 (12/19 1030) SpO2:  [91 %-99 %] 99 % (12/19 1030) Weight:  [87.5 kg-92.5 kg] 87.5 kg (12/18 1405)    Intake/Output from previous day: No intake/output data recorded. Intake/Output this shift: No intake/output data recorded.  PE: General: pleasant, WD, male who is laying in bed in NAD Lungs:  Respiratory effort nonlabored Abd: soft, moderate distension. Umbilical hernia is present but reduces easily with palpable defect. Mild TTP. No overlying signs of infection MSK: all 4 extremities are symmetrical with no cyanosis, clubbing, or edema. Skin: warm and dry  Psych: A&Ox3 with an appropriate affect.    Lab Results:  Recent Labs    09/21/22 1111 09/22/22 0500  WBC 4.8 3.3*  HGB 10.5* 8.5*  HCT 31.3* 25.5*  PLT 127* 103*   BMET Recent Labs    09/21/22 1111 09/22/22 0500  NA 132* 131*  K 3.9 3.2*  CL 96* 104  CO2 27 20*  GLUCOSE 184* 168*  BUN 52* 44*  CREATININE 1.60* 1.18  CALCIUM 8.3* 7.4*   PT/INR No results for input(s): "LABPROT", "INR" in the last 72 hours. CMP     Component Value Date/Time   NA 131 (L) 09/22/2022 0500   K 3.2 (L) 09/22/2022 0500   CL 104 09/22/2022 0500   CO2 20 (L) 09/22/2022 0500   GLUCOSE 168 (H) 09/22/2022 0500   BUN 44 (H) 09/22/2022 0500   CREATININE 1.18 09/22/2022 0500   CREATININE 1.60 (H) 09/21/2022 1111   CREATININE 1.33 (H) 01/21/2017 1616   CALCIUM 7.4 (L) 09/22/2022 0500   PROT 6.9 09/21/2022 1111   PROT 8.9 (H) 06/24/2022 1037   ALBUMIN 3.8 09/21/2022 1111   AST 13 (L) 09/21/2022 1111   ALT 18 09/21/2022 1111   ALKPHOS 59 09/21/2022 1111   BILITOT 0.9  09/21/2022 1111   GFRNONAA >60 09/22/2022 0500   GFRNONAA 43 (L) 09/21/2022 1111   GFRAA >60 01/27/2017 0717   Lipase  No results found for: "LIPASE"     Studies/Results: CT Abdomen Pelvis W Contrast  Result Date: 09/21/2022 CLINICAL DATA:  Nausea, vomiting, abdominal distention EXAM: CT ABDOMEN AND PELVIS WITH CONTRAST TECHNIQUE: Multidetector CT imaging of the abdomen and pelvis was performed using the standard protocol following bolus administration of intravenous contrast. RADIATION DOSE REDUCTION: This exam was performed according to the departmental dose-optimization program which includes automated exposure control, adjustment of the mA and/or kV according to patient size and/or use of iterative reconstruction technique. CONTRAST:  52m OMNIPAQUE IOHEXOL 300 MG/ML  SOLN COMPARISON:  Abdomen radiographs done today FINDINGS: Lower chest: Small linear patchy infiltrates are seen in both lower lung fields, more so in the left lower lobe. Coronary artery calcifications are seen. Hepatobiliary: No focal abnormalities are seen in liver. There is no dilation of bile ducts. Gallbladder is distended. There is no wall thickening in gallbladder. Pancreas: No focal abnormalities are seen. Spleen: Unremarkable. Adrenals/Urinary Tract: Adrenals are unremarkable. There is no hydronephrosis. There are no renal or ureteral stones. Urinary bladder is unremarkable. Stomach/Bowel: Small hiatal hernia is seen. Stomach is unremarkable. There is abnormal dilation of proximal small bowel loops measuring up  to 5 cm in diameter. Distal small bowel loops are decompressed. There is umbilical hernia containing short segment of a small bowel loop. There is mild edema in the fat planes within the umbilical hernia. Small bowel loops in the left side of the abdomen proximal to the hernia appear to be dilated and small bowel loops in the right side of abdomen distal to the hernia appear to be partly decompressed. Appendix is not  dilated. There is no significant wall thickening in colon. Multiple diverticula are seen in left colon without signs of focal acute diverticulitis. Vascular/Lymphatic: Scattered arterial calcifications are seen in aorta and its major branches. Reproductive: Coarse calcification is seen in prostate. Other: There is no ascites or pneumoperitoneum. Right inguinal hernia containing fat is seen. Musculoskeletal: Schmorl's node is seen in the upper endplate of body of L2 vertebra. Degenerative changes are noted with encroachment of neural foramina at multiple levels, more so at L4-L5 and L5-S1 levels. IMPRESSION: There is abnormal dilation of proximal small bowel loops with decompression of distal small bowel loops. There is umbilical hernia containing short segment of small bowel loop. Findings suggest partial obstruction of small bowel related to incarcerated obstructing umbilical hernia. Surgical consultation should be considered. There is no hydronephrosis. Appendix is not dilated. 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 Small hiatal hernia. There are linear densities in the lower lung fields suggesting subsegmental atelectasis. Minimal right pleural effusion. Coronary artery disease. Aortic arteriosclerosis. Imaging findings were relayed to patient's provider in the emergency room by telephone call. Electronically Signed   By: Elmer Picker M.D.   On: 09/21/2022 16:53   DG Abd 2 Views  Result Date: 09/21/2022 CLINICAL DATA:  Nausea, vomiting, constipation EXAM: ABDOMEN - 2 VIEW COMPARISON:  None Available. FINDINGS: Numerous distended, air and fluid filled loops of small bowel in the central abdomen, measuring up to 5.9 cm in caliber. Colon is nondistended with gas and stool present to the rectum. No free air in the abdomen. Osseous structures unremarkable. IMPRESSION: Numerous distended, air and fluid-filled loops of small bowel in the central abdomen, concerning for small bowel  obstruction. Consider CT to further evaluate. No free air in the abdomen. Electronically Signed   By: Delanna Ahmadi M.D.   On: 09/21/2022 12:59    Anti-infectives: Anti-infectives (From admission, onward)    Start     Dose/Rate Route Frequency Ordered Stop   09/22/22 1000  valACYclovir (VALTREX) tablet 1,000 mg        1,000 mg Oral Daily 09/22/22 0618          Assessment/Plan pSBO Umbilical hernia  - CT scan showing dilation with likely partial small bowel obstruction. Concern for obstructing UH but this remains soft and reducible on exam - AKI improved with Cr 1.1 (1.6)  -no signs of incarceration of hernia - passing flatus, suppository this am. Due to degree of distension continue clear liquids for now - ultimately he needs umbilical hernia repair but do not think it is neccessary during admission. Additionally he is on plavix here. Continue to monitor exam and bowel function and plan for outpatient follow up for discussion/planning of elective repair  FEN: CLD, IVF ID: none VTE: lovenox, plavix  Per primary Hypokalemia Multiple myeloma - follows with Dr. Marin Olp GERD BPH T2DM CAD, h/o MI and PCI- plavix last dose 0900 12/19  I reviewed hospitalist notes, last 24 h vitals and pain scores, last 48 h intake and output, last 24 h labs and trends, and last  24 h imaging results. .    LOS: 1 day   [facility] 09/22/2022, 10:49 AM Please see Amion for pager number during day hours 7:00am-4:30pm

## 2022-09-23 ENCOUNTER — Inpatient Hospital Stay (HOSPITAL_COMMUNITY): Payer: Medicare Other

## 2022-09-23 DIAGNOSIS — K56600 Partial intestinal obstruction, unspecified as to cause: Secondary | ICD-10-CM | POA: Diagnosis not present

## 2022-09-23 LAB — BASIC METABOLIC PANEL
Anion gap: 4 — ABNORMAL LOW (ref 5–15)
BUN: 34 mg/dL — ABNORMAL HIGH (ref 8–23)
CO2: 20 mmol/L — ABNORMAL LOW (ref 22–32)
Calcium: 7.5 mg/dL — ABNORMAL LOW (ref 8.9–10.3)
Chloride: 109 mmol/L (ref 98–111)
Creatinine, Ser: 1.14 mg/dL (ref 0.61–1.24)
GFR, Estimated: >60 mL/min (ref >60)
Glucose, Bld: 146 mg/dL — ABNORMAL HIGH (ref 70–99)
Potassium: 4 mmol/L (ref 3.5–5.1)
Sodium: 133 mmol/L — ABNORMAL LOW (ref 135–145)

## 2022-09-23 LAB — CBC
HCT: 23.7 % — ABNORMAL LOW (ref 39.0–52.0)
Hemoglobin: 7.8 g/dL — ABNORMAL LOW (ref 13.0–17.0)
MCH: 31.3 pg (ref 26.0–34.0)
MCHC: 32.9 g/dL (ref 30.0–36.0)
MCV: 95.2 fL (ref 80.0–100.0)
Platelets: 86 10*3/uL — ABNORMAL LOW (ref 150–400)
RBC: 2.49 MIL/uL — ABNORMAL LOW (ref 4.22–5.81)
RDW: 13.2 % (ref 11.5–15.5)
WBC: 3.7 10*3/uL — ABNORMAL LOW (ref 4.0–10.5)
nRBC: 0 % (ref 0.0–0.2)

## 2022-09-23 MED ORDER — POLYETHYLENE GLYCOL 3350 17 G PO PACK: 17.0000 g | PACK | Freq: Every day | ORAL | Status: DC | PRN

## 2022-09-23 MED ORDER — SENNOSIDES-DOCUSATE SODIUM 8.6-50 MG PO TABS: 2.0000 | ORAL_TABLET | Freq: Two times a day (BID) | ORAL | 0 refills | Status: AC

## 2022-09-23 MED ORDER — DOCUSATE SODIUM 100 MG PO CAPS: 100.0000 mg | ORAL_CAPSULE | Freq: Two times a day (BID) | ORAL | 0 refills | Status: DC

## 2022-09-23 MED ORDER — POTASSIUM CHLORIDE CRYS ER 20 MEQ PO TBCR: 20.0000 meq | EXTENDED_RELEASE_TABLET | Freq: Every day | ORAL | 0 refills | Status: DC

## 2022-09-23 MED ORDER — DOCUSATE SODIUM 100 MG PO CAPS
100.0000 mg | ORAL_CAPSULE | Freq: Two times a day (BID) | ORAL | Status: DC
  Administered 2022-09-23: 100 mg via ORAL
  Filled 2022-09-23: qty 1

## 2022-09-23 MED ORDER — SENNOSIDES-DOCUSATE SODIUM 8.6-50 MG PO TABS: 2.0000 | ORAL_TABLET | Freq: Two times a day (BID) | ORAL | Status: DC

## 2022-09-23 MED ORDER — ALUM & MAG HYDROXIDE-SIMETH 200-200-20 MG/5ML PO SUSP: 30.0000 mL | ORAL | Status: DC | PRN

## 2022-09-23 NOTE — Consult Note (Signed)
Mr. Alexander Armstrong is well-known to me.  He is an 81 year old white male.  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  He is in his first cycle of treatment.  He is getting Faspro/Velcade/Revlimid.  He is in the office for treatment on the 18th.  He said he was having nausea and vomiting for the past 4 to 5 days.  He has not had a bowel movement.  This was incredibly unusual for these symptoms to happen with this protocol.  He got abdominal films on him.  It showed that he had air-fluid levels in his small bowel.  He subsequently was admitted.  He had a CT scan which seem to suggest an incarcerated umbilical hernia.  Surgery saw him.  They reduce the hernia.  They felt that he could just be watched.  His labs today show white count 3.3.  Hemoglobin 8.5.  Platelet count 103,000.  His potassium is 3.2.  BUN 44 creatinine 1.18.  He began to have some soft food yesterday.  He is having no nausea or vomiting.  He is having bowel movements.  He has had no fever.  He has had no obvious melena or bright red blood per rectum.  He has had no cough or shortness of breath.  His other he may have to be able to go home today.  His vital signs show temperature of 97.5.  Pulse 64.  Blood pressure 99/66.  His lungs sound clear bilaterally.  Cardiac exam regular rate and rhythm.  Abdomen is soft.  I suppose there might be a little bit of distention.  Bowel sounds are not as active.  He has no guarding or rebound tenderness.  There is no obvious hernia.  Extremity shows no clubbing, cyanosis or edema.  Neurological exam is nonfocal.  Mr. Alexander Armstrong has a transient small bowel obstruction.,  Show as to why he would have had this.  He certainly seems to be improving.  Hopefully, he will be able to go home soon.  I noted the drop in his hemoglobin.  Some of this may be from hydration.  He is not having any melena or bright red blood per rectum.  It would be nice to repeat some x-rays on his abdomen  to see if the bowel obstruction has resolved.  We will follow along as long as he is in the hospital.  I know that he has had incredible care from everybody up on 14s.  Lattie Haw, MD  Oswaldo Milian 9:6

## 2022-09-23 NOTE — Progress Notes (Signed)
Progress Note     Subjective: Started having bowel movements yesterday. Tolerating FLD without nausea/emesis or abdominal pain. Bloating improved  Objective: Vital signs in last 24 hours: Temp:  [97.5 F (36.4 C)-97.9 F (36.6 C)] 97.5 F (36.4 C) (12/20 0554) Pulse Rate:  [49-75] 64 (12/20 0554) Resp:  [14-18] 18 (12/20 0554) BP: (99-135)/(59-67) 99/66 (12/20 0554) SpO2:  [95 %-99 %] 97 % (12/20 0554) Last BM Date : 09/22/22  Intake/Output from previous day: 12/19 0701 - 12/20 0700 In: 2355.4 [I.V.:2355.4] Out: -  Intake/Output this shift: No intake/output data recorded.  PE: General: pleasant, WD, male who is laying in bed in NAD Lungs:  Respiratory effort nonlabored Abd: soft, moderate distension. Umbilical hernia is present but reduces easily with palpable defect. Mild TTP. No overlying signs of infection MSK: all 4 extremities are symmetrical with no cyanosis, clubbing, or edema. Skin: warm and dry  Psych: A&Ox3 with an appropriate affect.    Lab Results:  Recent Labs    09/22/22 0500 09/23/22 0628  WBC 3.3* 3.7*  HGB 8.5* 7.8*  HCT 25.5* 23.7*  PLT 103* 86*    BMET Recent Labs    09/21/22 1111 09/22/22 0500  NA 132* 131*  K 3.9 3.2*  CL 96* 104  CO2 27 20*  GLUCOSE 184* 168*  BUN 52* 44*  CREATININE 1.60* 1.18  CALCIUM 8.3* 7.4*    PT/INR No results for input(s): "LABPROT", "INR" in the last 72 hours. CMP     Component Value Date/Time   NA 131 (L) 09/22/2022 0500   K 3.2 (L) 09/22/2022 0500   CL 104 09/22/2022 0500   CO2 20 (L) 09/22/2022 0500   GLUCOSE 168 (H) 09/22/2022 0500   BUN 44 (H) 09/22/2022 0500   CREATININE 1.18 09/22/2022 0500   CREATININE 1.60 (H) 09/21/2022 1111   CREATININE 1.33 (H) 01/21/2017 1616   CALCIUM 7.4 (L) 09/22/2022 0500   PROT 6.9 09/21/2022 1111   PROT 8.9 (H) 06/24/2022 1037   ALBUMIN 3.8 09/21/2022 1111   AST 13 (L) 09/21/2022 1111   ALT 18 09/21/2022 1111   ALKPHOS 59 09/21/2022 1111   BILITOT  0.9 09/21/2022 1111   GFRNONAA >60 09/22/2022 0500   GFRNONAA 43 (L) 09/21/2022 1111   GFRAA >60 01/27/2017 0717   Lipase  No results found for: "LIPASE"     Studies/Results: CT Abdomen Pelvis W Contrast  Result Date: 09/21/2022 CLINICAL DATA:  Nausea, vomiting, abdominal distention EXAM: CT ABDOMEN AND PELVIS WITH CONTRAST TECHNIQUE: Multidetector CT imaging of the abdomen and pelvis was performed using the standard protocol following bolus administration of intravenous contrast. RADIATION DOSE REDUCTION: This exam was performed according to the departmental dose-optimization program which includes automated exposure control, adjustment of the mA and/or kV according to patient size and/or use of iterative reconstruction technique. CONTRAST:  24m OMNIPAQUE IOHEXOL 300 MG/ML  SOLN COMPARISON:  Abdomen radiographs done today FINDINGS: Lower chest: Small linear patchy infiltrates are seen in both lower lung fields, more so in the left lower lobe. Coronary artery calcifications are seen. Hepatobiliary: No focal abnormalities are seen in liver. There is no dilation of bile ducts. Gallbladder is distended. There is no wall thickening in gallbladder. Pancreas: No focal abnormalities are seen. Spleen: Unremarkable. Adrenals/Urinary Tract: Adrenals are unremarkable. There is no hydronephrosis. There are no renal or ureteral stones. Urinary bladder is unremarkable. Stomach/Bowel: Small hiatal hernia is seen. Stomach is unremarkable. There is abnormal dilation of proximal small bowel loops measuring up to  5 cm in diameter. Distal small bowel loops are decompressed. There is umbilical hernia containing short segment of a small bowel loop. There is mild edema in the fat planes within the umbilical hernia. Small bowel loops in the left side of the abdomen proximal to the hernia appear to be dilated and small bowel loops in the right side of abdomen distal to the hernia appear to be partly decompressed. Appendix is  not dilated. There is no significant wall thickening in colon. Multiple diverticula are seen in left colon without signs of focal acute diverticulitis. Vascular/Lymphatic: Scattered arterial calcifications are seen in aorta and its major branches. Reproductive: Coarse calcification is seen in prostate. Other: There is no ascites or pneumoperitoneum. Right inguinal hernia containing fat is seen. Musculoskeletal: Schmorl's node is seen in the upper endplate of body of L2 vertebra. Degenerative changes are noted with encroachment of neural foramina at multiple levels, more so at L4-L5 and L5-S1 levels. IMPRESSION: There is abnormal dilation of proximal small bowel loops with decompression of distal small bowel loops. There is umbilical hernia containing short segment of small bowel loop. Findings suggest partial obstruction of small bowel related to incarcerated obstructing umbilical hernia. Surgical consultation should be considered. There is no hydronephrosis. Appendix is not dilated. 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 Small hiatal hernia. There are linear densities in the lower lung fields suggesting subsegmental atelectasis. Minimal right pleural effusion. Coronary artery disease. Aortic arteriosclerosis. Imaging findings were relayed to patient's provider in the emergency room by telephone call. Electronically Signed   By: Elmer Picker M.D.   On: 09/21/2022 16:53   DG Abd 2 Views  Result Date: 09/21/2022 CLINICAL DATA:  Nausea, vomiting, constipation EXAM: ABDOMEN - 2 VIEW COMPARISON:  None Available. FINDINGS: Numerous distended, air and fluid filled loops of small bowel in the central abdomen, measuring up to 5.9 cm in caliber. Colon is nondistended with gas and stool present to the rectum. No free air in the abdomen. Osseous structures unremarkable. IMPRESSION: Numerous distended, air and fluid-filled loops of small bowel in the central abdomen, concerning for small bowel  obstruction. Consider CT to further evaluate. No free air in the abdomen. Electronically Signed   By: Delanna Ahmadi M.D.   On: 09/21/2022 12:59    Anti-infectives: Anti-infectives (From admission, onward)    Start     Dose/Rate Route Frequency Ordered Stop   09/22/22 1000  valACYclovir (VALTREX) tablet 1,000 mg        1,000 mg Oral Daily 09/22/22 0618          Assessment/Plan pSBO Umbilical hernia  - CT scan showing dilation with likely partial small bowel obstruction. Concern for obstructing UH but this remains soft and reducible on exam - AKI improved -no signs of incarceration of hernia - tolerating FLD and having bowel function. Advance to soft diet. Continue stool softener and laxatives as needed - we are arranging outpatient follow up for elective repair of his hernia - stable for dc today if tolerates soft diet  FEN: soft, IVF ID: none VTE: lovenox, plavix  Per primary Hypokalemia Multiple myeloma - follows with Dr. Marin Olp GERD BPH T2DM CAD, h/o MI and PCI- plavix last dose 0900 12/19  I reviewed hospitalist notes, last 24 h vitals and pain scores, last 48 h intake and output, last 24 h labs and trends, and last 24 h imaging results. .    LOS: 2 days   Winferd Humphrey, [facility] 09/23/2022, 8:33  AM Please see Amion for pager number during day hours 7:00am-4:30pm

## 2022-09-23 NOTE — Discharge Summary (Signed)
Physician Discharge Summary  Alexander Armstrong XMI:[id number] DOB: 20-Feb-1941 DOA: 09/21/2022  PCP: Marin Olp, MD  Admit date: 09/21/2022 Discharge date: 09/23/2022  Admitted From: Home Disposition: Home  Recommendations for Outpatient Follow-up:  Follow up with PCP in 1-2 weeks Surgery will schedule follow-up. 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  Has history of umbilical hernia repair.  CT scan suggestive of partial small bowel obstruction.  He had persistent symptoms so admitted to the hospital.  Hemodynamically stable.   Partial small bowel obstruction due to adhesions: Reducible umbilical hernia:  Treated with IV fluids and slowly advance her diet.  Currently tolerating soft diet.  Normal bowel function.  Seen by surgery.  Recommended interval umbilical hernia repair and able to schedule follow-up.  Now with normal bowel function.  Recommended to continue with stool softener and laxatives to avoid constipation.  Chronic medical issues including Coronary artery disease, stable on Plavix. Umbilical hernia, as above recommended treatment. Hypokalemia, replaced.  Will send home with supplements.   Stable for discharge.  Discharge Diagnoses:  Principal Problem:   Partial small bowel obstruction [facility])    Discharge Instructions  Discharge Instructions     Call MD for:  persistant nausea and vomiting   Complete by: As directed    Call MD for:  severe uncontrolled pain   Complete by: As directed    Diet - low sodium heart healthy   Complete by: As  directed    Increase activity slowly   Complete by: As directed       Allergies as of 09/23/2022       Reactions   Latex Other (See Comments)   "takes the skin off" (tape)        Medication List     TAKE these medications    acetaminophen 500 MG tablet Commonly known as: TYLENOL Take 500 mg by mouth 3 (three) times daily as needed for moderate pain or headache.   Tylenol 325 MG tablet Generic drug: acetaminophen Take 650 mg by mouth See admin instructions. Take 650 mg by mouth one hour prior to Daratumumab infusion on Mondays   Benadryl Allergy 25 MG tablet Generic drug: diphenhydrAMINE Take 50 mg by mouth See admin instructions. Take 50 mg by mouth one hour prior to Daratumumab infusion on Mondays   clopidogrel 75 MG tablet Commonly known as: PLAVIX TAKE 1 TABLET(75 MG) BY MOUTH DAILY What changed: See the new instructions.   Cyanocobalamin 1000 MCG Tbcr Take 1,000 mcg by mouth daily.   dexamethasone 4 MG tablet Commonly known as: DECADRON Take 5 tabs (20 mg) weekly the day of daratumumab. Take with breakfast. What changed:  how much to take how to take this when to take this additional instructions   diltiazem 60 MG tablet Commonly known as: CARDIZEM Take 1 tablet (60 mg total) by mouth 4 (four) times daily. As needed for fast heart rate What changed:  when to take this reasons to take this additional instructions   diltiazem 120 MG 24 hr capsule Commonly known as: Dilt-XR Take 1 capsule (120 mg total) by mouth daily. What changed: Another medication with the same name was changed.  Make sure you understand how and when to take each.   docusate sodium 100 MG capsule Commonly known as: COLACE Take 1 capsule (100 mg total) by mouth 2 (two) times daily.   famciclovir 250 MG tablet Commonly known as: FAMVIR Take 1 tablet (250 mg total) by mouth daily. What changed: when to take this   fluticasone 50 MCG/ACT nasal spray Commonly known as:  FLONASE Place 1 spray into both nostrils daily as needed for allergies or rhinitis.   lenalidomide 15 MG capsule Commonly known as: REVLIMID Take 1 capsule (15 mg total) by mouth daily. [employer] Auth # [id number]    Date Obtained 08/14/2022   loratadine 10 MG tablet Commonly known as: CLARITIN Take 10 mg by mouth in the morning.   montelukast 10 MG tablet Commonly known as: Singulair Take 1 tablet (10 mg total) by mouth at bedtime. What changed:  when to take this additional instructions   ondansetron 8 MG tablet Commonly known as: Zofran Take 1 tablet (8 mg total) by mouth every 8 (eight) hours as needed for nausea or vomiting.   pantoprazole 40 MG tablet Commonly known as: PROTONIX Take 1 tablet (40 mg total) by mouth daily. What changed: when to take this   potassium chloride SA 20 MEQ tablet Commonly known as: KLOR-CON M Take 1 tablet (20 mEq total) by mouth daily for 7 days.   prochlorperazine 10 MG tablet Commonly known as: COMPAZINE Take 1 tablet (10 mg total) by mouth every 6 (six) hours as needed for nausea or vomiting.   Refresh Plus 0.5 % Soln Generic drug: Carboxymethylcellulose Sod PF Place 1 drop into both eyes 3 (three) times daily as needed (for dryness).   rosuvastatin 20 MG tablet Commonly known as: Crestor Take 1 tablet (20 mg total) by mouth daily.   senna-docusate 8.6-50 MG tablet Commonly known as: Senokot-S Take 2 tablets by mouth 2 (two) times daily.   tamsulosin 0.4 MG Caps capsule Commonly known as: FLOMAX Take 0.4 mg by mouth at bedtime.   Vitamin D3 25 MCG (1000 UT) Caps Take 1,000 Units by mouth at bedtime.        Follow-up Information     Donnie Mesa, MD. Call.   Specialty: General Surgery Why: We are making a follow up appointment for you., Please call to confirm appointment time., Arrive 30 minutes early to complete check in, and bring photo ID and insurance card. Contact information: [address] [place]  [place] [address] [phone]                Allergies  Allergen Reactions   Latex Other (See Comments)    "takes the skin off" (tape)    Consultations: General surgery   Procedures/Studies: DG Abd 2 Views  Result Date: 09/23/2022 CLINICAL DATA:  Ileus versus SBO. EXAM: ABDOMEN - 2 VIEW COMPARISON:  09/21/2022 FINDINGS: Small-bowel dilatation still present consistent with ileus versus obstruction. Stool and air overlies the colon and rectosigmoid. No organomegaly. No radiopaque stones identified. No free intraperitoneal air identified. IMPRESSION: Slight improvement in small bowel dilatation compared to the prior study. Electronically Signed   By: Sammie Bench M.D.   On: 09/23/2022 08:56   CT Abdomen Pelvis W Contrast  Result Date: 09/21/2022 CLINICAL DATA:  Nausea, vomiting, abdominal distention EXAM: CT ABDOMEN AND PELVIS WITH CONTRAST TECHNIQUE: Multidetector CT imaging of the abdomen and pelvis was performed using the standard protocol following bolus administration of intravenous contrast. RADIATION DOSE REDUCTION: This exam was  performed according to the departmental dose-optimization program which includes automated exposure control, adjustment of the mA and/or kV according to patient size and/or use of iterative reconstruction technique. CONTRAST:  36m OMNIPAQUE IOHEXOL 300 MG/ML  SOLN COMPARISON:  Abdomen radiographs done today FINDINGS: Lower chest: Small linear patchy infiltrates are seen in both lower lung fields, more so in the left lower lobe. Coronary artery calcifications are seen. Hepatobiliary: No focal abnormalities are seen in liver. There is no dilation of bile ducts. Gallbladder is distended. There is no wall thickening in gallbladder. Pancreas: No focal abnormalities are seen. Spleen: Unremarkable. Adrenals/Urinary Tract: Adrenals are unremarkable. There is no hydronephrosis. There are no renal or ureteral stones. Urinary bladder is unremarkable. Stomach/Bowel:  Small hiatal hernia is seen. Stomach is unremarkable. There is abnormal dilation of proximal small bowel loops measuring up to 5 cm in diameter. Distal small bowel loops are decompressed. There is umbilical hernia containing short segment of a small bowel loop. There is mild edema in the fat planes within the umbilical hernia. Small bowel loops in the left side of the abdomen proximal to the hernia appear to be dilated and small bowel loops in the right side of abdomen distal to the hernia appear to be partly decompressed. Appendix is not dilated. There is no significant wall thickening in colon. Multiple diverticula are seen in left colon without signs of focal acute diverticulitis. Vascular/Lymphatic: Scattered arterial calcifications are seen in aorta and its major branches. Reproductive: Coarse calcification is seen in prostate. Other: There is no ascites or pneumoperitoneum. Right inguinal hernia containing fat is seen. Musculoskeletal: Schmorl's node is seen in the upper endplate of body of L2 vertebra. Degenerative changes are noted with encroachment of neural foramina at multiple levels, more so at L4-L5 and L5-S1 levels. IMPRESSION: There is abnormal dilation of proximal small bowel loops with decompression of distal small bowel loops. There is umbilical hernia containing short segment of small bowel loop. Findings suggest partial obstruction of small bowel related to incarcerated obstructing umbilical hernia. Surgical consultation should be considered. There is no hydronephrosis. Appendix is not dilated. 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 Small hiatal hernia. There are linear densities in the lower lung fields suggesting subsegmental atelectasis. Minimal right pleural effusion. Coronary artery disease. Aortic arteriosclerosis. Imaging findings were relayed to patient's provider in the emergency room by telephone call. Electronically Signed   By: PElmer PickerM.D.   On:  09/21/2022 16:53   DG Abd 2 Views  Result Date: 09/21/2022 CLINICAL DATA:  Nausea, vomiting, constipation EXAM: ABDOMEN - 2 VIEW COMPARISON:  None Available. FINDINGS: Numerous distended, air and fluid filled loops of small bowel in the central abdomen, measuring up to 5.9 cm in caliber. Colon is nondistended with gas and stool present to the rectum. No free air in the abdomen. Osseous structures unremarkable. IMPRESSION: Numerous distended, air and fluid-filled loops of small bowel in the central abdomen, concerning for small bowel obstruction. Consider CT to further evaluate. No free air in the abdomen. Electronically Signed   By: ADelanna AhmadiM.D.   On: 09/21/2022 12:59   (Echo, Carotid, EGD, Colonoscopy, ERCP)    Subjective: Patient seen and examined.  No overnight events.  Denies any nausea vomiting.  Occasional abdominal pain and mild distention but he had normal bowel movements overnight.  Tolerating full liquid and later on tolerated soft diet.   Discharge Exam: Vitals:   09/23/22 0554 09/23/22 1257  BP: 99/66 106/67  Pulse: 64 (!) 46  Resp: 18 18  Temp: (!) 97.5 F (36.4 C) (!) 97.4 F (36.3 C)  SpO2: 97% 97%   Vitals:   09/22/22 2002 09/22/22 2314 09/23/22 0554 09/23/22 1257  BP: 107/67 105/62 99/66 106/67  Pulse: (!) 49 (!) 56 64 (!) 46  Resp: _0 Temp: (!) 97.5 F (36.4 C) (!) 97.5 F (36.4 C) (!) 97.5 F (36.4 C) (!) 97.4 F (36.3 C)  TempSrc: Oral Oral Oral Oral  SpO2: 98% 96% 97% 97%  Weight:        General: Pt is alert, awake, not in acute distress Cardiovascular: RRR, S1/S2 +, no rubs, no gallops Respiratory: CTA bilaterally, no wheezing, no rhonchi Abdominal: Soft, NT, ND, bowel sounds +, mildly distended but nontender.  Reducible umbilical hernia. Extremities: no edema, no cyanosis    The results of significant diagnostics from this hospitalization (including imaging, microbiology, ancillary and laboratory) are listed below for reference.      Microbiology: No results found for this or any previous visit (from the past 240 hour(s)).   Labs: BNP (last 3 results) No results for input(s): "BNP" in the last 8760 hours. Basic Metabolic Panel: Recent Labs  Lab 09/21/22 1111 09/22/22 0500 09/23/22 0628  NA 132* 131* 133*  K 3.9 3.2* 4.0  CL 96* 104 109  CO2 27 20* 20*  GLUCOSE 184* 168* 146*  BUN 52* 44* 34*  CREATININE 1.60* 1.18 1.14  CALCIUM 8.3* 7.4* 7.5*  MG  --  1.9  --    Liver Function Tests: Recent Labs  Lab 09/21/22 1111  AST 13*  ALT 18  ALKPHOS 59  BILITOT 0.9  PROT 6.9  ALBUMIN 3.8   No results for input(s): "LIPASE", "AMYLASE" in the last 168 hours. No results for input(s): "AMMONIA" in the last 168 hours. CBC: Recent Labs  Lab 09/21/22 1111 09/22/22 0500 09/23/22 0628  WBC 4.8 3.3* 3.7*  NEUTROABS 1.8  --   --   HGB 10.5* 8.5* 7.8*  HCT 31.3* 25.5* 23.7*  MCV 92.1 93.4 95.2  PLT 127* 103* 86*   Cardiac Enzymes: No results for input(s): "CKTOTAL", "CKMB", "CKMBINDEX", "TROPONINI" in the last 168 hours. BNP: Invalid input(s): "POCBNP" CBG: No results for input(s): "GLUCAP" in the last 168 hours. D-Dimer No results for input(s): "DDIMER" in the last 72 hours. Hgb A1c No results for input(s): "HGBA1C" in the last 72 hours. Lipid Profile No results for input(s): "CHOL", "HDL", "LDLCALC", "TRIG", "CHOLHDL", "LDLDIRECT" in the last 72 hours. Thyroid function studies No results for input(s): "TSH", "T4TOTAL", "T3FREE", "THYROIDAB" in the last 72 hours.  Invalid input(s): "FREET3" Anemia work up No results for input(s): "VITAMINB12", "FOLATE", "FERRITIN", "TIBC", "IRON", "RETICCTPCT" in the last 72 hours. Urinalysis    Component Value Date/Time   COLORURINE YELLOW 06/24/2022 1037   APPEARANCEUR CLEAR 06/24/2022 1037   LABSPEC 1.020 06/24/2022 1037   PHURINE 6.0 06/24/2022 1037   GLUCOSEU NEGATIVE 06/24/2022 1037   HGBUR NEGATIVE 06/24/2022 1037   BILIRUBINUR NEGATIVE  06/24/2022 1037   KETONESUR NEGATIVE 06/24/2022 1037   UROBILINOGEN 0.2 06/24/2022 1037   NITRITE NEGATIVE 06/24/2022 1037   LEUKOCYTESUR NEGATIVE 06/24/2022 1037   Sepsis Labs Recent Labs  Lab 09/21/22 1111 09/22/22 0500 09/23/22 0628  WBC 4.8 3.3* 3.7*   Microbiology No results found for this or any previous visit (from the past 240 hour(s)).   Time coordinating discharge: 31 minutes  SIGNED:   Barb Merino, MD  [facility] 09/23/2022, 2:22 PM

## 2022-09-24 ENCOUNTER — Telehealth: Payer: Self-pay

## 2022-09-24 ENCOUNTER — Ambulatory Visit: Payer: Medicare Other | Admitting: Physical Therapy

## 2022-09-24 ENCOUNTER — Encounter: Payer: Self-pay | Admitting: Hematology & Oncology

## 2022-09-24 NOTE — Telephone Encounter (Signed)
Noted thanks but will not qualify for TCM based on time course

## 2022-09-24 NOTE — Telephone Encounter (Signed)
Transition Care Management Follow-up Telephone Call Date of discharge and from where: 09/23/22; [facility] How have you been since you were released from the hospital? Stable  Any questions or concerns? No  Items Reviewed: Did the pt receive and understand the discharge instructions provided? Yes  Medications obtained and verified? Yes  Other? No  Any new allergies since your discharge? No  Dietary orders reviewed? Yes Do you have support at home? Yes   Home Care and Equipment/Supplies: Were home health services ordered? no If so, what is the name of the agency? N/a  Has the agency set up a time to come to the patient's home? not applicable Were any new equipment or medical supplies ordered?  No What is the name of the medical supply agency? N/a Were you able to get the supplies/equipment? not applicable Do you have any questions related to the use of the equipment or supplies? No  Functional Questionnaire: (I = Independent and D = Dependent) ADLs: I  Bathing/Dressing- I  Meal Prep- I  Eating- I  Maintaining continence- I  Transferring/Ambulation- I  Managing Meds- I  Follow up appointments reviewed:  PCP Hospital f/u appt confirmed? Yes  Scheduled to see Dr. Yong Channel on 10/16/22 @ 11. [facility] f/u appt confirmed?  Patient still needs to confirm appointment with [facility]-Dr. Georgette Dover   Are transportation arrangements needed? No  If their condition worsens, is the pt aware to call PCP or go to the Emergency Dept.? Yes Was the patient provided with contact information for the PCP's office or ED? Yes Was to pt encouraged to call back with questions or concerns? Yes

## 2022-09-25 ENCOUNTER — Other Ambulatory Visit: Payer: Self-pay | Admitting: Cardiology

## 2022-10-08 ENCOUNTER — Inpatient Hospital Stay (HOSPITAL_BASED_OUTPATIENT_CLINIC_OR_DEPARTMENT_OTHER): Payer: Medicare Other | Admitting: Hematology & Oncology

## 2022-10-08 ENCOUNTER — Other Ambulatory Visit: Payer: Self-pay

## 2022-10-08 ENCOUNTER — Inpatient Hospital Stay: Payer: Medicare Other

## 2022-10-08 ENCOUNTER — Inpatient Hospital Stay: Payer: Medicare Other | Attending: Hematology & Oncology

## 2022-10-08 ENCOUNTER — Encounter: Payer: Self-pay | Admitting: Hematology & Oncology

## 2022-10-08 ENCOUNTER — Other Ambulatory Visit (HOSPITAL_BASED_OUTPATIENT_CLINIC_OR_DEPARTMENT_OTHER): Payer: Self-pay

## 2022-10-08 VITALS — BP 92/52 | HR 74 | Resp 18

## 2022-10-08 VITALS — BP 94/58 | HR 81 | Temp 97.8°F | Resp 18 | Ht 72.0 in | Wt 191.8 lb

## 2022-10-08 DIAGNOSIS — Z5112 Encounter for antineoplastic immunotherapy: Secondary | ICD-10-CM | POA: Diagnosis not present

## 2022-10-08 DIAGNOSIS — Z79899 Other long term (current) drug therapy: Secondary | ICD-10-CM | POA: Insufficient documentation

## 2022-10-08 DIAGNOSIS — C9000 Multiple myeloma not having achieved remission: Secondary | ICD-10-CM | POA: Insufficient documentation

## 2022-10-08 DIAGNOSIS — D630 Anemia in neoplastic disease: Secondary | ICD-10-CM | POA: Insufficient documentation

## 2022-10-08 LAB — LACTATE DEHYDROGENASE: LDH: 125 U/L (ref 98–192)

## 2022-10-08 LAB — CBC WITH DIFFERENTIAL (CANCER CENTER ONLY)
Abs Immature Granulocytes: 0.01 10*3/uL (ref 0.00–0.07)
Basophils Absolute: 0.1 10*3/uL (ref 0.0–0.1)
Basophils Relative: 2 %
Eosinophils Absolute: 0.2 10*3/uL (ref 0.0–0.5)
Eosinophils Relative: 3 %
HCT: 30.9 % — ABNORMAL LOW (ref 39.0–52.0)
Hemoglobin: 9.9 g/dL — ABNORMAL LOW (ref 13.0–17.0)
Immature Granulocytes: 0 %
Lymphocytes Relative: 28 %
Lymphs Abs: 1.5 10*3/uL (ref 0.7–4.0)
MCH: 30.9 pg (ref 26.0–34.0)
MCHC: 32 g/dL (ref 30.0–36.0)
MCV: 96.6 fL (ref 80.0–100.0)
Monocytes Absolute: 0.6 10*3/uL (ref 0.1–1.0)
Monocytes Relative: 10 %
Neutro Abs: 3.1 10*3/uL (ref 1.7–7.7)
Neutrophils Relative %: 57 %
Platelet Count: 240 10*3/uL (ref 150–400)
RBC: 3.2 MIL/uL — ABNORMAL LOW (ref 4.22–5.81)
RDW: 13.9 % (ref 11.5–15.5)
WBC Count: 5.5 10*3/uL (ref 4.0–10.5)
nRBC: 0 % (ref 0.0–0.2)

## 2022-10-08 LAB — CMP (CANCER CENTER ONLY)
ALT: 12 U/L (ref 0–44)
AST: 16 U/L (ref 15–41)
Albumin: 3.8 g/dL (ref 3.5–5.0)
Alkaline Phosphatase: 74 U/L (ref 38–126)
Anion gap: 7 (ref 5–15)
BUN: 24 mg/dL — ABNORMAL HIGH (ref 8–23)
CO2: 28 mmol/L (ref 22–32)
Calcium: 8.9 mg/dL (ref 8.9–10.3)
Chloride: 104 mmol/L (ref 98–111)
Creatinine: 1.2 mg/dL (ref 0.61–1.24)
GFR, Estimated: >60 mL/min (ref >60)
Glucose, Bld: 155 mg/dL — ABNORMAL HIGH (ref 70–99)
Potassium: 4 mmol/L (ref 3.5–5.1)
Sodium: 139 mmol/L (ref 135–145)
Total Bilirubin: 0.4 mg/dL (ref 0.3–1.2)
Total Protein: 6.1 g/dL — ABNORMAL LOW (ref 6.5–8.1)

## 2022-10-08 MED ORDER — DEXAMETHASONE 4 MG PO TABS: 20.0000 mg | ORAL_TABLET | Freq: Once | ORAL | Status: DC

## 2022-10-08 MED ORDER — MONTELUKAST SODIUM 10 MG PO TABS
10.0000 mg | ORAL_TABLET | Freq: Once | ORAL | Status: DC
  Filled 2022-10-08: qty 1

## 2022-10-08 MED ORDER — BORTEZOMIB CHEMO SQ INJECTION 3.5 MG (2.5MG/ML)
1.3000 mg/m2 | Freq: Once | INTRAMUSCULAR | Status: AC
  Administered 2022-10-08: 2.75 mg via SUBCUTANEOUS
  Filled 2022-10-08: qty 1.1

## 2022-10-08 MED ORDER — DIPHENHYDRAMINE HCL 25 MG PO CAPS: 50.0000 mg | ORAL_CAPSULE | Freq: Once | ORAL | Status: DC

## 2022-10-08 MED ORDER — ACETAMINOPHEN 325 MG PO TABS: 650.0000 mg | ORAL_TABLET | Freq: Once | ORAL | Status: DC

## 2022-10-08 MED ORDER — DARATUMUMAB-HYALURONIDASE-FIHJ 1800-30000 MG-UT/15ML ~~LOC~~ SOLN
1800.0000 mg | Freq: Once | SUBCUTANEOUS | Status: AC
  Administered 2022-10-08: 1800 mg via SUBCUTANEOUS
  Filled 2022-10-08: qty 15

## 2022-10-08 MED ORDER — SHINGRIX 50 MCG/0.5ML IM SUSR
INTRAMUSCULAR | 0 refills | Status: DC
  Filled 2022-10-08: qty 1, 1d supply, fill #0

## 2022-10-08 NOTE — Patient Instructions (Signed)
[facility]  Discharge Instructions: Thank you for choosing [facility] to provide your oncology and hematology care.   If you have a lab appointment with the [facility], please go directly to the [facility] and check in at the registration area.  Wear comfortable clothing and clothing appropriate for easy access to any Portacath or PICC line.   We strive to give you quality time with your provider. You may need to reschedule your appointment if you arrive late (15 or more minutes).  Arriving late affects you and other patients whose appointments are after yours.  Also, if you miss three or more appointments without notifying the office, you may be dismissed from the clinic at the provider's discretion.      For prescription refill requests, have your pharmacy contact our office and allow 72 hours for refills to be completed.    Today you received the following chemotherapy and/or immunotherapy agents Velcade, Darzalex.      To help prevent nausea and vomiting after your treatment, we encourage you to take your nausea medication as directed.  BELOW ARE SYMPTOMS THAT SHOULD BE REPORTED IMMEDIATELY: *FEVER GREATER THAN 100.4 F (38 C) OR HIGHER *CHILLS OR SWEATING *NAUSEA AND VOMITING THAT IS NOT CONTROLLED WITH YOUR NAUSEA MEDICATION *UNUSUAL SHORTNESS OF BREATH *UNUSUAL BRUISING OR BLEEDING *URINARY PROBLEMS (pain or burning when urinating, or frequent urination) *BOWEL PROBLEMS (unusual diarrhea, constipation, pain near the anus) TENDERNESS IN MOUTH AND THROAT WITH OR WITHOUT PRESENCE OF ULCERS (sore throat, sores in mouth, or a toothache) UNUSUAL RASH, SWELLING OR PAIN  UNUSUAL VAGINAL DISCHARGE OR ITCHING   Items with * indicate a potential emergency and should be followed up as soon as possible or go to the Emergency Department if any problems should occur.  Please show the CHEMOTHERAPY ALERT CARD or IMMUNOTHERAPY ALERT CARD at check-in  to the Emergency Department and triage nurse. Should you have questions after your visit or need to cancel or reschedule your appointment, please contact [facility]  [phone] and follow the prompts.  Office hours are 8:00 a.m. to 4:30 p.m. Monday - Friday. Please note that voicemails left after 4:00 p.m. may not be returned until the following business day.  We are closed weekends and major holidays. You have access to a nurse at all times for urgent questions. Please call the main number to the clinic [phone] and follow the prompts.  For any non-urgent questions, you may also contact your provider using MyChart. We now offer e-Visits for anyone 90 and older to request care online for non-urgent symptoms. For details visit [email or web address].   Also download the MyChart app! Go to the app store, search "MyChart", open the app, select [facility], and log in with your MyChart username and password.

## 2022-10-08 NOTE — Progress Notes (Signed)
Hematology and Oncology Follow Up Visit  Alexander Armstrong [id number] Feb 18, 1941 82 y.o. 10/08/2022   Principle Diagnosis:  IgG Kappa myeloma --complex chromosomal abnormalities   Current Therapy:   Faspro/Velcade/Revlimid/Decadron -- start cycle #1 on 09/04/2022     Interim History:  Alexander Armstrong is back for follow-up.  Last event was some, he had to be admitted because of the likelihood of bowel obstruction.  He had an umbilical hernia that, thankfully was able to be reduced.  He is going to need surgery for this but surgery would like to hold off as long as possible.  He is feeling much better.  He did have a nice Christmas.  He had a nice New Year.  He has had no issues with the chemotherapy.  He has had no problems with nausea or vomiting.  He has had no change in bowel or bladder habits.  She has had no bleeding.  There has been no rashes.  He has had no leg swelling.  His appetite has been much better.  Overall, I would say that his performance status is probably ECOG 1.  Medications:  Current Outpatient Medications:    acetaminophen (TYLENOL) 500 MG tablet, Take 500 mg by mouth 3 (three) times daily as needed for moderate pain or headache., Disp: , Rfl:    Cholecalciferol (VITAMIN D3) 1000 units CAPS, Take 1,000 Units by mouth at bedtime., Disp: , Rfl:    clopidogrel (PLAVIX) 75 MG tablet, TAKE 1 TABLET(75 MG) BY MOUTH DAILY (Patient taking differently: Take 75 mg by mouth in the morning.), Disp: 90 tablet, Rfl: 2   Cyanocobalamin 1000 MCG TBCR, Take 1,000 mcg by mouth daily., Disp: , Rfl:    dexamethasone (DECADRON) 4 MG tablet, Take 5 tabs (20 mg) weekly the day of daratumumab. Take with breakfast. (Patient taking differently: Take 20 mg by mouth See admin instructions. Take 20 mg with breakfast by mouth one hour prior to Daratumumab infusion on Mondays), Disp: 60 tablet, Rfl: 5   diltiazem (CARDIZEM) 60 MG tablet, Take 1 tablet (60 mg total) by mouth 4 (four) times daily. As needed  for fast heart rate (Patient taking differently: Take 60 mg by mouth 4 (four) times daily as needed (for a fast heart rate or palpitations).), Disp: 60 tablet, Rfl: 1   diltiazem (DILT-XR) 120 MG 24 hr capsule, Take 1 capsule (120 mg total) by mouth daily., Disp: 90 capsule, Rfl: 0   diphenhydrAMINE (BENADRYL ALLERGY) 25 MG tablet, Take 50 mg by mouth See admin instructions. Take 50 mg by mouth one hour prior to Daratumumab infusion on Mondays, Disp: , Rfl:    docusate sodium (COLACE) 100 MG capsule, Take 1 capsule (100 mg total) by mouth 2 (two) times daily., Disp: 10 capsule, Rfl: 0   famciclovir (FAMVIR) 250 MG tablet, Take 1 tablet (250 mg total) by mouth daily. (Patient taking differently: Take 250 mg by mouth at bedtime.), Disp: 30 tablet, Rfl: 6   fluticasone (FLONASE) 50 MCG/ACT nasal spray, Place 1 spray into both nostrils daily as needed for allergies or rhinitis., Disp: , Rfl:    lenalidomide (REVLIMID) 15 MG capsule, Take 1 capsule (15 mg total) by mouth daily. [employer] Auth # [id number]    Date Obtained 08/14/2022, Disp: 21 capsule, Rfl: 0   loratadine (CLARITIN) 10 MG tablet, Take 10 mg by mouth in the morning., Disp: , Rfl:    montelukast (SINGULAIR) 10 MG tablet, Take 1 tablet (10 mg total) by mouth at bedtime. (Patient taking differently: Take  10 mg by mouth See admin instructions. Take 10 mg by mouth in the morning and an additional 10 mg one hour prior to Daratumumab infusion on Mondays), Disp: 30 tablet, Rfl: 6   ondansetron (ZOFRAN) 8 MG tablet, Take 1 tablet (8 mg total) by mouth every 8 (eight) hours as needed for nausea or vomiting., Disp: 30 tablet, Rfl: 1   pantoprazole (PROTONIX) 40 MG tablet, Take 1 tablet (40 mg total) by mouth daily. (Patient taking differently: Take 40 mg by mouth daily before breakfast.), Disp: 90 tablet, Rfl: 3   prochlorperazine (COMPAZINE) 10 MG tablet, Take 1 tablet (10 mg total) by mouth every 6 (six) hours as needed for nausea or vomiting., Disp: 30  tablet, Rfl: 1   REFRESH PLUS 0.5 % SOLN, Place 1 drop into both eyes 3 (three) times daily as needed (for dryness)., Disp: , Rfl:    rosuvastatin (CRESTOR) 20 MG tablet, Take 1 tablet (20 mg total) by mouth daily., Disp: 90 tablet, Rfl: 3   senna-docusate (SENOKOT-S) 8.6-50 MG tablet, Take 2 tablets by mouth 2 (two) times daily., Disp: 120 tablet, Rfl: 0   tamsulosin (FLOMAX) 0.4 MG CAPS capsule, Take 0.4 mg by mouth at bedtime., Disp: , Rfl:    TYLENOL 325 MG tablet, Take 650 mg by mouth See admin instructions. Take 650 mg by mouth one hour prior to Daratumumab infusion on Mondays, Disp: , Rfl:   Allergies:  Allergies  Allergen Reactions   Latex Other (See Comments)    "takes the skin off" (tape)    Past Medical History, Surgical history, Social history, and Family History were reviewed and updated.  Review of Systems: Review of Systems  Constitutional: Negative.   HENT:  Negative.    Eyes: Negative.   Respiratory: Negative.    Cardiovascular: Negative.   Gastrointestinal: Negative.   Endocrine: Negative.   Genitourinary: Negative.    Musculoskeletal: Negative.   Skin: Negative.   Neurological: Negative.   Hematological: Negative.   Psychiatric/Behavioral: Negative.      Physical Exam:  height is 6' (1.829 m) and weight is 191 lb 12.8 oz (87 kg). His oral temperature is 97.8 F (36.6 C). His blood pressure is 94/58 (abnormal) and his pulse is 81. His respiration is 18 and oxygen saturation is 100%.   Wt Readings from Last 3 Encounters:  10/08/22 191 lb 12.8 oz (87 kg)  09/21/22 193 lb (87.5 kg)  09/07/22 201 lb (91.2 kg)    Physical Exam Vitals reviewed.  HENT:     Head: Normocephalic and atraumatic.  Eyes:     Pupils: Pupils are equal, round, and reactive to light.  Cardiovascular:     Rate and Rhythm: Normal rate and regular rhythm.     Heart sounds: Normal heart sounds.  Pulmonary:     Effort: Pulmonary effort is normal.     Breath sounds: Normal breath  sounds.  Abdominal:     General: Bowel sounds are normal.     Palpations: Abdomen is soft.     Comments: Abdominal exam is somewhat distended.  The umbilical hernia is reducible.  He has active bowel sounds.  There is no guarding or rebound tenderness.  He has no palpable liver or spleen tip.  There is no fluid wave.    Musculoskeletal:        General: No tenderness or deformity. Normal range of motion.     Cervical back: Normal range of motion.  Lymphadenopathy:     Cervical: No cervical  adenopathy.  Skin:    General: Skin is warm and dry.     Findings: No erythema or rash.  Neurological:     Mental Status: He is alert and oriented to person, place, and time.  Psychiatric:        Behavior: Behavior normal.        Thought Content: Thought content normal.        Judgment: Judgment normal.     Lab Results  Component Value Date   WBC 5.5 10/08/2022   HGB 9.9 (L) 10/08/2022   HCT 30.9 (L) 10/08/2022   MCV 96.6 10/08/2022   PLT 240 10/08/2022     Chemistry      Component Value Date/Time   NA 139 10/08/2022 0907   K 4.0 10/08/2022 0907   CL 104 10/08/2022 0907   CO2 28 10/08/2022 0907   BUN 24 (H) 10/08/2022 0907   CREATININE 1.20 10/08/2022 0907   CREATININE 1.33 (H) 01/21/2017 1616      Component Value Date/Time   CALCIUM 8.9 10/08/2022 0907   ALKPHOS 74 10/08/2022 0907   AST 16 10/08/2022 0907   ALT 12 10/08/2022 0907   BILITOT 0.4 10/08/2022 0907       Impression and Plan: Mr. Loftin is a very nice 82 year old white male.  He has IgG kappa myeloma.  I think what troubles me is his cytogenetics.  He has a very complex karyotype.  Again he probably has about 12 different abnormalities with his chromosomes in the bone marrow.  I suspect this probably will put him at high risk.  I am so happy that he is doing better.  I am even happy that he did not require any surgery.  We will continue with his treatment protocol.  It is really too early for [place] to know how well it  is working.  Again I am just glad that he did not need surgery.  We will plan to get him back to see [place] in another couple weeks.   Volanda Napoleon, MD 1/4/202410:09 AM

## 2022-10-09 ENCOUNTER — Telehealth: Payer: Self-pay | Admitting: Family Medicine

## 2022-10-09 ENCOUNTER — Other Ambulatory Visit (HOSPITAL_BASED_OUTPATIENT_CLINIC_OR_DEPARTMENT_OTHER): Payer: Self-pay

## 2022-10-09 LAB — KAPPA/LAMBDA LIGHT CHAINS
Kappa free light chain: 13 mg/L (ref 3.3–19.4)
Kappa, lambda light chain ratio: 1.76 — ABNORMAL HIGH (ref 0.26–1.65)
Lambda free light chains: 7.4 mg/L (ref 5.7–26.3)

## 2022-10-09 LAB — BETA 2 MICROGLOBULIN, SERUM: Beta-2 Microglobulin: 3.3 mg/L — ABNORMAL HIGH (ref 0.6–2.4)

## 2022-10-09 NOTE — Telephone Encounter (Signed)
Please call [phone] when calling back - ok to speak with [name].

## 2022-10-09 NOTE — Telephone Encounter (Signed)
Pt stated he would like [facility]'s opinion on Dr. Donnie Mesa. He will be performing pt's surgery & he only trusts [facility]. He also wanted me to inform him that he is incredibly thankful for [facility] & states he is a wonderful provider.   Please call work number at [phone].

## 2022-10-09 NOTE — Telephone Encounter (Signed)
Please see pt call msg, seeking opinion about upcoming surgery/surgeon and advise

## 2022-10-10 LAB — IGG, IGA, IGM
IgA: 25 mg/dL — ABNORMAL LOW (ref 61–437)
IgG (Immunoglobin G), Serum: 749 mg/dL (ref 603–1613)
IgM (Immunoglobulin M), Srm: 8 mg/dL — ABNORMAL LOW (ref 15–143)

## 2022-10-10 NOTE — Telephone Encounter (Signed)
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

## 2022-10-12 ENCOUNTER — Ambulatory Visit: Payer: Self-pay | Admitting: Surgery

## 2022-10-12 ENCOUNTER — Encounter: Payer: Self-pay | Admitting: *Deleted

## 2022-10-12 LAB — PROTEIN ELECTROPHORESIS, SERUM
A/G Ratio: 1.4 (ref 0.7–1.7)
Albumin ELP: 3.2 g/dL (ref 2.9–4.4)
Alpha-1-Globulin: 0.3 g/dL (ref 0.0–0.4)
Alpha-2-Globulin: 0.7 g/dL (ref 0.4–1.0)
Beta Globulin: 0.7 g/dL (ref 0.7–1.3)
Gamma Globulin: 0.6 g/dL (ref 0.4–1.8)
Globulin, Total: 2.3 g/dL (ref 2.2–3.9)
M-Spike, %: 0.5 g/dL — ABNORMAL HIGH
Total Protein ELP: 5.5 g/dL — ABNORMAL LOW (ref 6.0–8.5)

## 2022-10-12 NOTE — Telephone Encounter (Signed)
Called and lm on pt vm with below information.

## 2022-10-12 NOTE — H&P (Signed)
Subjective    Chief Complaint: New Consultation (umb hernia)   Oncology - [facility] - Dr. [name]- History of Present Illness: Alexander Armstrong is a 82 y.o. male who is seen today as an office consultation at the request of Dr. Marin Olp for evaluation of New Consultation (umb hernia) .   This is a 82 year old male with multiple myeloma currently undergoing treatment by Dr. Marin Olp.  He was evaluated in the emergency department on 09/21/2022 to the with a small bowel obstruction.  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  We were able to reduce the hernia and his obstruction resolved.  The patient is chronically anticoagulated on Plavix.  He is still early in his treatment protocol.   Since discharge from the hospital, he still has a little bit of soreness around his umbilicus but the hernia remains reducible.  He denies any obstructive symptoms.  He is having daily bowel movements.  Appetite and bowel movements remain normal.   The patient had primary repair of this umbilical hernia about 50 years ago.     Review of Systems: A complete review of systems was obtained from the patient.  I have reviewed this information and discussed as appropriate with the patient.  See HPI as well for other ROS.   Review of Systems  Constitutional: Negative.   HENT: Negative.    Eyes: Negative.   Respiratory: Negative.    Cardiovascular: Negative.   Gastrointestinal: Negative.   Genitourinary: Negative.   Musculoskeletal: Negative.   Skin: Negative.   Neurological: Negative.   Endo/Heme/Allergies: Negative.   Psychiatric/Behavioral: Negative.          Medical History: Past Medical History      Past Medical History:  Diagnosis Date   History of cancer             Patient Active Problem List  Diagnosis   Atherosclerotic heart disease of native coronary artery without angina pectoris   BPH associated with nocturia   Chronic respiratory  insufficiency   Gastroesophageal reflux disease without esophagitis   Hyperlipidemia associated with type 2 diabetes mellitus    History of acute anterior wall MI   Multiple myeloma (CMS-HCC)   Partial small bowel obstruction (CMS-HCC)   Type 2 diabetes mellitus without complication, without long-term current use of insulin (CMS-HCC)   Umbilical hernia      Past Surgical History       Past Surgical History:  Procedure Laterality Date   Cardiac catheterization       Coronary stent intervention       Electrophysiologic study       HOLTER MONITOR       Left heart cath and coronary angiography       Transthoracic echocardiogram            Allergies       Allergies  Allergen Reactions   Latex Other (See Comments)      "takes the skin off" (tape)   "takes the skin off"   Adhesive Tape-Silicones Rash        No current outpatient medications on file prior to visit.    No current facility-administered medications on file prior to visit.      Family History  History reviewed. No pertinent family history.      Social History       Tobacco Use  Smoking Status Never  Smokeless Tobacco Never      Social History  Social  History        Socioeconomic History   Marital status: Married  Tobacco Use   Smoking status: Never   Smokeless tobacco: Never  Substance and Sexual Activity   Alcohol use: Never   Drug use: Never        Objective:         Vitals:    10/12/22 1050  BP: 100/66  Pulse: 96  Temp: 37.1 C (98.7 F)  SpO2: 98%  Weight: 86.6 kg (191 lb)  Height: 182.9 cm (6')    Body mass index is 25.9 kg/m.   Physical Exam    Constitutional:  WDWN in NAD, conversant, no obvious deformities; lying in bed comfortably Eyes:  Pupils equal, round; sclera anicteric; moist conjunctiva; no lid lag HENT:  Oral mucosa moist; good dentition  Neck:  No masses palpated, trachea midline; no thyromegaly Lungs:  CTA bilaterally; normal respiratory effort CV:   Regular rate and rhythm; no murmurs; extremities well-perfused with no edema Abd:  +bowel sounds, soft, non-tender, no palpable organomegaly; large umbilical hernia that is reducible with direct palpation.  The fascial defect is approximately 3 cm in diameter. Musc:  Unable to assess gait; no apparent clubbing or cyanosis in extremities Lymphatic:  No palpable cervical or axillary lymphadenopathy Skin:  Warm, dry; no sign of jaundice Psychiatric - alert and oriented x 4; calm mood and affect     Labs, Imaging and Diagnostic Testing: CLINICAL DATA:  Nausea, vomiting, abdominal distention   EXAM: CT ABDOMEN AND PELVIS WITH CONTRAST   TECHNIQUE: Multidetector CT imaging of the abdomen and pelvis was performed using the standard protocol following bolus administration of intravenous contrast.   RADIATION DOSE REDUCTION: This exam was performed according to the departmental dose-optimization program which includes automated exposure control, adjustment of the mA and/or kV according to patient size and/or use of iterative reconstruction technique.   CONTRAST:  58m OMNIPAQUE IOHEXOL 300 MG/ML  SOLN   COMPARISON:  Abdomen radiographs done today   FINDINGS: Lower chest: Small linear patchy infiltrates are seen in both lower lung fields, more so in the left lower lobe. Coronary artery calcifications are seen.   Hepatobiliary: No focal abnormalities are seen in liver. There is no dilation of bile ducts. Gallbladder is distended. There is no wall thickening in gallbladder.   Pancreas: No focal abnormalities are seen.   Spleen: Unremarkable.   Adrenals/Urinary Tract: Adrenals are unremarkable. There is no hydronephrosis. There are no renal or ureteral stones. Urinary bladder is unremarkable.   Stomach/Bowel: Small hiatal hernia is seen. Stomach is unremarkable. There is abnormal dilation of proximal small bowel loops measuring up to 5 cm in diameter. Distal small bowel loops are  decompressed. There is umbilical hernia containing short segment of a small bowel loop. There is mild edema in the fat planes within the umbilical hernia. Small bowel loops in the left side of the abdomen proximal to the hernia appear to be dilated and small bowel loops in the right side of abdomen distal to the hernia appear to be partly decompressed. Appendix is not dilated. There is no significant wall thickening in colon. Multiple diverticula are seen in left colon without signs of focal acute diverticulitis.   Vascular/Lymphatic: Scattered arterial calcifications are seen in aorta and its major branches.   Reproductive: Coarse calcification is seen in prostate.   Other: There is no ascites or pneumoperitoneum. Right inguinal hernia containing fat is seen.   Musculoskeletal: Schmorl's node is seen in the upper endplate  of body of L2 vertebra. Degenerative changes are noted with encroachment of neural foramina at multiple levels, more so at L4-L5 and L5-S1 levels.   IMPRESSION: There is abnormal dilation of proximal small bowel loops with decompression of distal small bowel loops. There is umbilical hernia containing short segment of small bowel loop. Findings suggest partial obstruction of small bowel related to incarcerated obstructing umbilical hernia. Surgical consultation should be considered.   There is no hydronephrosis. Appendix is not dilated. 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 Small hiatal hernia.   There are linear densities in the lower lung fields suggesting subsegmental atelectasis. Minimal right pleural effusion. Coronary artery disease. Aortic arteriosclerosis.   Imaging findings were relayed to patient's provider in the emergency room by telephone call.     Electronically Signed   By: Elmer Picker M.D.   On: 09/21/2022 16:53   Assessment and Plan:  Diagnoses and all orders for this visit:   Umbilical hernia  without obstruction or gangrene     The patient has presented to the emergency room at least once with an incarcerated hernia.  We were able to reduce this.  He should have this repaired to prevent recurrence of the incarceration and to prevent strangulation.  However, the timing of the surgery will be complicated by his ongoing treatment for multiple myeloma.  He will need a 4 to 6-week window of time between treatments to allow surgery and adequate recovery.  I will communicate with his oncologist to see if this is feasible in the next few months.  We will also need to obtain cardiac clearance from cardiology prior to scheduling surgery.   No follow-ups on file.   Carlean Jews, MD  10/12/2022 5:37 PM

## 2022-10-13 ENCOUNTER — Telehealth: Payer: Self-pay

## 2022-10-13 NOTE — Telephone Encounter (Signed)
   Pre-operative Risk Assessment    Patient Name: Alexander Armstrong  DOB: 1941/06/04 MRN: [id number]      Request for Surgical Clearance    Procedure:   UMBILICAL HERNIA REPAIR  Date of Surgery:  Clearance TBD                                 Surgeon:  DR Donnie Mesa Surgeon's Group or Practice Name:  [facility]  Phone number:  [phone] Fax number:  [phone]   Type of Clearance Requested:   - Pharmacy:  Hold Clopidogrel (Plavix) INSTRUCTIONS FROM YOU AS TO HOW TO HOLD MEDICATION PREOPERATIVELY   Type of Anesthesia:  General    Additional requests/questions:  Please advise surgeon/provider what medications should be held. Please fax a copy of CARDIAC CLEARANCE to the surgeon's office.  Signed, Jeanmarie Plant Parsa Rickett  CCMA 10/13/2022, 3:35 PM

## 2022-10-14 ENCOUNTER — Telehealth: Payer: Self-pay | Admitting: *Deleted

## 2022-10-14 NOTE — Telephone Encounter (Signed)
   Name: Tedford Berg  DOB: 27-Oct-1940  MRN: [id number]  Primary Cardiologist: [name], MD  Chart reviewed as part of pre-operative protocol coverage. Because of Rick Carruthers past medical history and time since last visit, he will require a follow-up telephone visit in order to better assess preoperative cardiovascular risk.  Pre-op covering staff: - Please schedule appointment and call patient to inform them. If patient already had an upcoming appointment within acceptable timeframe, please add "pre-op clearance" to the appointment notes so provider is aware. - Please contact requesting surgeon's office via preferred method (i.e, phone, fax) to inform them of need for appointment prior to surgery.  Per Dr. Allison Quarry note:  He is maintained on Plavix x 5 days which would be okay to hold for procedures or surgeries. (For bone marrow biopsy would hold a minimum of 7 days prior to procedure and likely 3 days post).  Elgie Collard, PA-C  10/14/2022, 12:56 PM

## 2022-10-14 NOTE — Telephone Encounter (Signed)
  Patient Consent for Virtual Visit       Alexander Armstrong has provided verbal consent on 10/14/2022 for a virtual visit (video or telephone).   CONSENT FOR VIRTUAL VISIT FOR:  Alexander Armstrong  By participating in this virtual visit I agree to the following:  I hereby voluntarily request, consent and authorize [facility] and its employed or contracted physicians, physician assistants, nurse practitioners or other licensed health care professionals (the Practitioner), to provide me with telemedicine health care services (the "Services") as deemed necessary by the treating Practitioner. I acknowledge and consent to receive the Services by the Practitioner via telemedicine. I understand that the telemedicine visit will involve communicating with the Practitioner through live audiovisual communication technology and the disclosure of certain medical information by electronic transmission. I acknowledge that I have been given the opportunity to request an in-person assessment or other available alternative prior to the telemedicine visit and am voluntarily participating in the telemedicine visit.  I understand that I have the right to withhold or withdraw my consent to the use of telemedicine in the course of my care at any time, without affecting my right to future care or treatment, and that the Practitioner or I may terminate the telemedicine visit at any time. I understand that I have the right to inspect all information obtained and/or recorded in the course of the telemedicine visit and may receive copies of available information for a reasonable fee.  I understand that some of the potential risks of receiving the Services via telemedicine include:  Delay or interruption in medical evaluation due to technological equipment failure or disruption; Information transmitted may not be sufficient (e.g. poor resolution of images) to allow for appropriate medical decision making by the Practitioner;  and/or  In rare instances, security protocols could fail, causing a breach of personal health information.  Furthermore, I acknowledge that it is my responsibility to provide information about my medical history, conditions and care that is complete and accurate to the best of my ability. I acknowledge that Practitioner's advice, recommendations, and/or decision may be based on factors not within their control, such as incomplete or inaccurate data provided by me or distortions of diagnostic images or specimens that may result from electronic transmissions. I understand that the practice of medicine is not an exact science and that Practitioner makes no warranties or guarantees regarding treatment outcomes. I acknowledge that a copy of this consent can be made available to me via my patient portal ([facility]), or I can request a printed copy by calling the office of [facility].    I understand that my insurance will be billed for this visit.   I have read or had this consent read to me. I understand the contents of this consent, which adequately explains the benefits and risks of the Services being provided via telemedicine.  I have been provided ample opportunity to ask questions regarding this consent and the Services and have had my questions answered to my satisfaction. I give my informed consent for the services to be provided through the use of telemedicine in my medical care

## 2022-10-16 ENCOUNTER — Other Ambulatory Visit (HOSPITAL_BASED_OUTPATIENT_CLINIC_OR_DEPARTMENT_OTHER): Payer: Self-pay

## 2022-10-16 ENCOUNTER — Telehealth: Payer: Self-pay | Admitting: Family Medicine

## 2022-10-16 ENCOUNTER — Encounter: Payer: Self-pay | Admitting: Family Medicine

## 2022-10-16 ENCOUNTER — Ambulatory Visit (INDEPENDENT_AMBULATORY_CARE_PROVIDER_SITE_OTHER): Payer: Medicare Other | Admitting: Family Medicine

## 2022-10-16 VITALS — BP 90/60 | HR 85 | Temp 98.5°F | Ht 72.0 in | Wt 189.2 lb

## 2022-10-16 DIAGNOSIS — E119 Type 2 diabetes mellitus without complications: Secondary | ICD-10-CM

## 2022-10-16 LAB — MICROALBUMIN / CREATININE URINE RATIO
Creatinine,U: 97.8 mg/dL
Microalb Creat Ratio: 2.1 mg/g (ref 0.0–30.0)
Microalb, Ur: 2 mg/dL — ABNORMAL HIGH (ref 0.0–1.9)

## 2022-10-16 MED ORDER — TRIAMCINOLONE ACETONIDE 0.1 % EX CREA: 1.0000 | TOPICAL_CREAM | Freq: Two times a day (BID) | CUTANEOUS | 0 refills | Status: DC

## 2022-10-16 NOTE — Progress Notes (Signed)
Phone [phone] In person visit   Subjective:   Alexander Armstrong is a 82 y.o. year old very pleasant male patient who presents for/with See problem oriented charting Chief Complaint  Patient presents with   Follow-up    Pt states he itches    hyperproteinemia   Past Medical History-  Patient Active Problem List   Diagnosis Date Noted   Wide-complex tachycardia - WCT ([place]) 12/09/2016    Priority: High   Cardiac syncope 03/10/2016    Priority: High   Cardiac device in situ 11/18/2015    Priority: High   Type 2 diabetes mellitus without complication, without long-term current use of insulin ([place]) 11/18/2015    Priority: High   Paroxysmal VT ([place]) 09/17/2015    Priority: High   History of acute anterior wall MI 01/26/2008    Priority: High   Coronary artery disease involving native coronary artery of native heart without angina pectoris 11/03/2004    Priority: High   Hypotension 02/22/2018    Priority: Medium    BPH associated with nocturia 11/18/2015    Priority: Medium    Gastroesophageal reflux disease without esophagitis 11/18/2015    Priority: Medium    Hyperlipidemia associated with type 2 diabetes mellitus ([place]) 01/26/2008    Priority: Medium    ESOPHAGEAL STRICTURE 12/26/2007    Priority: Medium    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    Priority: Medium    Exercise intolerance 02/20/2019    Priority: Low   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   Other  acquired hammer toe 05/27/2017    Priority: 1.   Acute right-sided low back pain without sciatica 11/18/2015    Priority: 1.   Ingrown nail 11/18/2015    Priority: 1.   Partial small bowel obstruction ([place]) 09/21/2022   Multiple myeloma ([place]) 08/13/2022    Medications- reviewed and updated Current Outpatient Medications  Medication Sig Dispense Refill   acetaminophen (TYLENOL) 500 MG tablet Take 500 mg by mouth 3 (three) times daily as needed for moderate pain or headache.     Cholecalciferol (VITAMIN D3) 1000 units CAPS Take 1,000 Units by mouth at bedtime.     clopidogrel (PLAVIX) 75 MG tablet TAKE 1 TABLET(75 MG) BY MOUTH DAILY (Patient taking differently: Take 75 mg by mouth in the morning.) 90 tablet 2   Cyanocobalamin 1000 MCG TBCR Take 1,000 mcg by mouth daily.     dexamethasone (DECADRON) 4 MG tablet Take 5 tabs (20 mg) weekly the day of daratumumab. Take with breakfast. (Patient taking differently: Take 20 mg by mouth See admin instructions. Take 20 mg with breakfast by mouth one hour prior to Daratumumab infusion on Mondays) 60 tablet 5   diltiazem (CARDIZEM) 60 MG tablet Take 1 tablet (60 mg total) by mouth 4 (four) times daily. As needed for fast heart rate (Patient taking differently: Take 60 mg by mouth 4 (four) times daily as needed (for  a fast heart rate or palpitations).) 60 tablet 1   diltiazem (DILT-XR) 120 MG 24 hr capsule Take 1 capsule (120 mg total) by mouth daily. 90 capsule 0   diphenhydrAMINE (BENADRYL ALLERGY) 25 MG tablet Take 50 mg by mouth See admin instructions. Take 50 mg by mouth one hour prior to Daratumumab infusion on Mondays     famciclovir (FAMVIR) 250 MG tablet Take 1 tablet (250 mg total) by mouth daily. (Patient taking differently: Take 250 mg by mouth at bedtime.) 30 tablet 6   fluticasone (FLONASE) 50 MCG/ACT nasal spray Place 1 spray into both nostrils daily as needed for allergies or rhinitis.     lenalidomide (REVLIMID) 15 MG capsule Take 1 capsule  (15 mg total) by mouth daily. [employer] Auth # [id number]    Date Obtained 08/14/2022 21 capsule 0   loratadine (CLARITIN) 10 MG tablet Take 10 mg by mouth in the morning.     montelukast (SINGULAIR) 10 MG tablet Take 1 tablet (10 mg total) by mouth at bedtime. (Patient taking differently: Take 10 mg by mouth See admin instructions. Take 10 mg by mouth in the morning and an additional 10 mg one hour prior to Daratumumab infusion on Mondays) 30 tablet 6   ondansetron (ZOFRAN) 8 MG tablet Take 1 tablet (8 mg total) by mouth every 8 (eight) hours as needed for nausea or vomiting. 30 tablet 1   pantoprazole (PROTONIX) 40 MG tablet Take 1 tablet (40 mg total) by mouth daily. (Patient taking differently: Take 40 mg by mouth daily before breakfast.) 90 tablet 3   prochlorperazine (COMPAZINE) 10 MG tablet Take 1 tablet (10 mg total) by mouth every 6 (six) hours as needed for nausea or vomiting. 30 tablet 1   REFRESH PLUS 0.5 % SOLN Place 1 drop into both eyes 3 (three) times daily as needed (for dryness).     rosuvastatin (CRESTOR) 20 MG tablet Take 1 tablet (20 mg total) by mouth daily. 90 tablet 3   senna-docusate (SENOKOT-S) 8.6-50 MG tablet Take 2 tablets by mouth 2 (two) times daily. 120 tablet 0   tamsulosin (FLOMAX) 0.4 MG CAPS capsule Take 0.4 mg by mouth at bedtime.     TYLENOL 325 MG tablet Take 650 mg by mouth See admin instructions. Take 650 mg by mouth one hour prior to Daratumumab infusion on Mondays     Zoster Vaccine Adjuvanted [facility]) injection Inject into the muscle. 1 each 0   No current facility-administered medications for this visit.     Objective:  BP 90/60   Pulse 85   Temp 98.5 F (36.9 C) (Temporal)   Ht 6' (1.829 m)   Wt 189 lb 3.2 oz (85.8 kg)   SpO2 98%   BMI 25.66 kg/m  Gen: NAD, resting comfortably CV: RRR no murmurs rubs or gallops Lungs: CTAB no crackles, wheeze, rhonchi Ext: trace edema Skin: warm, dry     Assessment and Plan   # Hospital follow-up for  small bowel obstruction S: Patient was admitted for abdominal pain on 09/21/2022 after not having a bowel movement for approximately a week.  History of umbilical hernia repair.  CT scan suggestive of partial small bowel obstruction-thought related to adhesions.  Reducible umbilical hernia was noted.  He was treated with IV fluids and was able to slowly advance diet.  Tolerated soft diet for discharge.  General surgery was consulted but with improvement in bowels patient did not require intervention-recommendation was for umbilical hernia repair and patient is scheduled  to have this with Dr. Georgette Dover  Intermittent abdominal pain but nothing nearly as severe. Regular bowel movements now on his bowel regimen. Diffuse itchiness in winter occurs on abdomen and back- worse this winter and worse particular after getting out of hospital- sensitive skin soaps and moisturizers. Has a topical cream that helps some- not sure of name A/P: thankfully bowels are doing much better- regular bowel movements. Some but much improved pain. Planned umbilical hernia surgery with Dr. Georgette Dover- my only concern about this is getting his pressure up some before surgery- clearance was already sent to cardiology  -continue colace and senokot regularly with miralax if needed  # Low blood pressure/orthostatic hypotension S:history of low blood pressure for years and years even prior to current illness. Blood pressure tends to run low in general- in last few years actually up some until last month when stomach issues began. He even once had a physician tell him to eat more salt. He is able to eat ok- but does think could get more salt in  - for palpitations in past- beta blcoker caused too much fatigue A/P: from message to Dr. [name] "Dr. [name],   I know you are aware of his history of orthostatic hypotension- blood pressure he reports had been running 110-115 until had partial sbo last month- since then has been running around 100 at  home and sometimes even lower. In office today 90/60 (he feels fine today). At church over weekend he had prolonged standing and got as low as 75/48 reportedly and felt orthostatic symptoms.   I was wondering if you would be ok with me having him hold his diltiazem 120 mg XR and only using the as need IR diltiazem if palpitations occur.   Thanks, Garret Reddish"   # Multiple myeloma-follows with Dr. Marin Olp S: Patient with ongoing infusions through oncology as of January 2024.  Also on Revlimid at home  A/P: reported 80% improvement on recent labs! Though will need 3-4 weeks off chemotherapy per report to have hernia surgery- Dr. Marin Olp and Dr. Georgette Dover working    #CAD-follows with Dr. [name] with history of MI and PCI #hyperlipidemia-LDL goal under 70 #Palpitations-as needed diltiazem 60 mg in addition to 120 mg extended release S: Medication:Plavix 75 mg, rosuvastatin 20 mg -no chest pain or shortness of breath  Lab Results  Component Value Date   CHOL 119 02/02/2022   HDL 34 (A) 02/02/2022   LDLCALC 66 02/02/2022   TRIG 136 02/02/2022  A/P: CAD asymptomatic- continue current medications although considering stopping diltiazem XR which may have antianginal element Lipids with excellent control last check- continue current medications  Palpitations- see hypertension sectoin   # Diabetes S: Medication: diet controlled Sugars 150 but not fasting Lab Results  Component Value Date   HGBA1C 7.7 (H) 06/24/2022  A/P: will do POC a1c today and I will reach out with results- honestly id probably be ok under 7.5 or 8 at his age and upcoming surgery- want to avoid lows  #OAB/bph- only on tamsulosin though can contribute to orthostatic issues- right now though think main thing needs to be to allow BP higher if possible. Will hold on any changes -he may try off on days feels more lightheaded (tamsulosin)  #abdominal hernia- periumbical- repaired years ago- wants to hold off on  surgery  Recommended follow up: Return in about 14 weeks (around 01/22/2023) for followup or sooner if needed.Schedule b4 you leave. Future Appointments  Date Time Provider [place]  10/20/2022  9:20 AM [facility] PRE OP CLEARANCE APP [facility]  10/22/2022  8:30 AM [facility] LAB [facility] None  10/22/2022  8:45 AM [place], Rudell Cobb, MD [facility] None  10/22/2022  9:00 AM [facility] A3 [facility] None  12/23/2022  9:00 AM Marin Olp, MD [facility]   Lab/Order associations:   ICD-10-CM   1. Type 2 diabetes mellitus without complication, without long-term current use of insulin (HCC)  E11.9 Microalbumin / creatinine urine ratio      No orders of the defined types were placed in this encounter.   Return precautions advised.  Garret Reddish, MD

## 2022-10-16 NOTE — Telephone Encounter (Signed)
I am not sure on the mixture that was sent but I did send plain triamcinolone and for him to trial

## 2022-10-16 NOTE — Patient Instructions (Addendum)
Consider flu shot when you are off yoru chemo short term preparing for surgery  Due for Tdap if get a cut or scrape- only covered at pharmacy  Let me know name of cream for itching- I'm open to considering this.   No changes on pressure today but reaching out to Dr. [name] for his thoughts.    -he may try off on days feels more lightheaded (tamsulosin)  Please stop by lab before you go If you have mychart- we will send your results within 3 business days of [place] receiving them.  If you do not have mychart- we will call you about results within 5 business days of [place] receiving them.  *please also note that you will see labs on mychart as soon as they post. I will later go in and write notes on them- will say "notes from Dr. Yong Channel"   Recommended follow up: Return in about 14 weeks (around 01/22/2023) for followup or sooner if needed.Schedule b4 you leave. Cancel march visit

## 2022-10-16 NOTE — Telephone Encounter (Signed)
Thanks

## 2022-10-16 NOTE — Telephone Encounter (Signed)
Patient states the name of the anti-itch cream is Triamcinolone 0.1%  Minerin 1:1

## 2022-10-17 ENCOUNTER — Encounter: Payer: Self-pay | Admitting: Family Medicine

## 2022-10-17 ENCOUNTER — Other Ambulatory Visit: Payer: Self-pay

## 2022-10-19 NOTE — Progress Notes (Unsigned)
Virtual Visit via Telephone Note   Because of Alexander Armstrong co-morbid illnesses, he is at least at moderate risk for complications without adequate follow up.  This format is felt to be most appropriate for this patient at this time.  The patient did not have access to video technology/had technical difficulties with video requiring transitioning to audio format only (telephone).  All issues noted in this document were discussed and addressed.  No physical exam could be performed with this format.  Please refer to the patient's chart for his consent to telehealth for [facility].  Evaluation Performed:  Preoperative cardiovascular risk assessment _____________   Date:  10/19/2022   Patient ID:  Alexander Armstrong, DOB Apr 13, 1941, MRN [id number] Patient Location:  Home Provider location:   Office  Primary Care Provider:  Marin Olp, MD Primary Cardiologist:  [name], MD  Chief Complaint / Patient Profile   82 y.o. y/o male with a h/o CAD s/p PCI 2006 with staged PCI to RCA Taxus DES in 2/206, HLD, multiple myeloma, paroxysmal VT, DM type II who is pending umbilical hernia repair and presents today for telephonic preoperative cardiovascular risk assessment.  History of Present Illness    Alexander Armstrong is a 82 y.o. male who presents via audio/video conferencing for a telehealth visit today.  Pt was last seen in cardiology clinic on 08/07/2022 by Dr. [name].  At that time Tabius Rood was doing well will from cardiac standpoint and no reported heart failure symptoms or arrhythmia.  The patient is now pending procedure as outlined above. Since his last visit, he reports that he is doing well from a cardiac perspective with no new cardiac complaints.  He denies chest pain, shortness of breath, lower extremity edema, fatigue, palpitations, melena, hematuria, hemoptysis, diaphoresis, weakness, presyncope, syncope, orthopnea, and PND.    Past Medical History    Past Medical  History:  Diagnosis Date   Allergy    Arthritis    Barrett esophagus 12/26/2007, 11/05/2010   basal cell skin cancer    CAD S/P percutaneous coronary angioplasty 11/03/2004   a. Ant Stemi (11/03/04) mLAD - PCI: Taxus DES 3.0 x 20; b. (11/06/04) staged RCA DES PCI: Taxus DES 3.5 x 16; c. (01/2017) High Risk Myoview (~fixed anterior defect with normal WM) @ [facility] (Dr. [name]) Synergy DES 3.0 x 12 (for 99% lesion @ prox edge of old stent   Cardiac syncope    No episode since starting flecainide. Had documented WIDE COMPLEX Tachycardia on loop recorder.   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 Unable to induce during EP study.;  Due to CAD, flecainide discontinued -> intolerant of both mexiletine and amiodarone -> Dr. Curt Bears (EP-Cards) d/c'd mexilitine & statrted low dose Diltizem XT; Holter Monitor 12/2018 - HR 47-114, rate PACs/PVCs, no Afib/flutter or SVT, VT.    Reflux gastritis    STEMI involving left anterior descending coronary artery ([place]) 11/03/2004   [facility]: Occluded LAD treated with DES. Staged PCI to the RCA.   Past Surgical History:  Procedure Laterality Date   CARDIAC CATHETERIZATION  ~2011   [facility]: re-look Cath - patent stents   CORONARY STENT INTERVENTION N/A 01/27/2017   Procedure: Coronary Stent Intervention;  Surgeon: Leonie Green  [name], MD;  Location: [facility]: mLAD (just after D1) overlapping prior DES -> SYNERGY DES 3X12 drug eluting stent   ELECTROPHYSIOLOGIC STUDY  2014   Unable to stimulate VT seen on loop recorder   HOLTER MONITOR  12/2018    (Off of mexiletine, only on diltiazem) heart rate ranged from 47 to 114 bpm.  Rare PACs and PVCs.  Sinus rhythm noted no A. fib or other arrhythmia.    implanted loop heart monitor x2  ~2011; 2014   Per report- batter out of date; apparently captured a prolonged episode of wide complex tachycardia associated with an episode of weakness/near syncope   INTRAVASCULAR PRESSURE WIRE/FFR STUDY N/A 01/27/2017   Procedure: Intravascular Pressure Wire/FFR Study;  Surgeon: [name], MD;  Location: [facility];  Service: Cardiovascular;  Laterality: N/A;   LEFT HEART CATH AND CORONARY ANGIOGRAPHY N/A 01/27/2017   Procedure: Left Heart Cath and Coronary Angiography;  Surgeon: [name], MD;  Location: [facility] INVASIVE CV LAB: mLAD 99% stenosis pre-Stent & ISR --> PCI. Ost DI ~50%. pRCA DES ~50-60% FFR 0.86.   NM MYOVIEW LTD  07/2014   Unremarkable EKG. No evidence of ischemia or infarct. Mild anterior attenuation, cannot exclude prior infarct, but nonreversible. EF 66%.   NM MYOVIEW LTD  12/22/2016   INTERMEDIATE RISK. EF 58%. Defect 1: Medium size severe defect in the mid anterior, apical anterior and apical wall consistent with prior anterior MI. Defect to: Large defect and moderate severity in the basal inferoseptal basal inferior and inferoseptal mid inferior wall consistent with ischemia.   NM MYOVIEW LTD  04/05/2019   EF 55 to 65%.  Small size, mild severity fixed perfusion defect in the mid-apical anterior septal wall.  Likely represents old anterior infarct.   No evidence of ischemia. LOW RISK.    PERCUTANEOUS CORONARY STENT INTERVENTION (PCI-S)  January-February 2006   a. mLAD - Taxus DES 3.0 x 20; b. (11/06/04) staged PCI to RCA Taxus DES 3.5 x 16; c. 01/27/2017: PCI to mLAD prox to old  stent - Synergy DES 3.0 x 12   TRANSTHORACIC ECHOCARDIOGRAM  10/11/2020    [facility]):  Normal LV size & function. EF 55-60%. NO RWMA. Normal filling parameters.  Mild TR however estimated moderate pulmonary hypertension with RVSP 52 mmHg.  IVC is mildly dilated.  No vegetations noted.  No significant change from last study.   TRANSTHORACIC ECHOCARDIOGRAM   05/2020   [facility]: 05/21/2020: Normal LV size and function.  Normal valves.  No effusion.; 06/04/2020: Normal LV size and function EF 60 to 65%.  Trace MR and TR.  No pulmonary hypertension.  Stable   UMBILICAL HERNIA REPAIR      Allergies  Allergies  Allergen Reactions   Latex Other (See Comments)    "takes the skin off" (tape)    Home Medications    Prior to Admission medications   Medication Sig Start Date End Date Taking? Authorizing Provider  triamcinolone cream (KENALOG) 0.1 % Apply 1 Application topically 2 (two) times daily. For 7-10 days maximum 10/16/22   Marin Olp, MD  acetaminophen (TYLENOL) 500 MG tablet Take 500 mg by mouth 3 (three) times daily as needed for moderate pain or headache.    [provider]  Cholecalciferol (VITAMIN D3) 1000 units CAPS Take 1,000 Units by mouth at bedtime.    [provider]  clopidogrel (PLAVIX) 75 MG tablet TAKE 1 TABLET(75 MG) BY MOUTH DAILY Patient taking  differently: Take 75 mg by mouth in the morning. 11/02/19   [name], MD  Cyanocobalamin 1000 MCG TBCR Take 1,000 mcg by mouth daily. 01/13/22   [provider]  dexamethasone (DECADRON) 4 MG tablet Take 5 tabs (20 mg) weekly the day of daratumumab. Take with breakfast. Patient taking differently: Take 20 mg by mouth See admin instructions. Take 20 mg with breakfast by mouth one hour prior to Daratumumab infusion on Mondays 09/09/22   Volanda Napoleon, MD  diltiazem (CARDIZEM) 60 MG tablet Take 1 tablet (60 mg total) by mouth 4 (four) times daily. As needed for fast heart rate Patient taking differently: Take 60 mg by mouth 4 (four) times daily as needed (for a fast heart rate or palpitations). 01/08/21   [name], MD  diltiazem (DILT-XR) 120 MG 24 hr capsule Take 1 capsule (120 mg total) by mouth daily. 09/25/22   [name], MD  diphenhydrAMINE (BENADRYL ALLERGY) 25 MG tablet Take 50 mg by mouth See admin instructions. Take  50 mg by mouth one hour prior to Daratumumab infusion on Mondays    [provider]  famciclovir (FAMVIR) 250 MG tablet Take 1 tablet (250 mg total) by mouth daily. Patient taking differently: Take 250 mg by mouth at bedtime. 09/02/22   Volanda Napoleon, MD  fluticasone (FLONASE) 50 MCG/ACT nasal spray Place 1 spray into both nostrils daily as needed for allergies or rhinitis.    [provider]  lenalidomide (REVLIMID) 15 MG capsule Take 1 capsule (15 mg total) by mouth daily. [employer] Auth # [id number]    Date Obtained 08/14/2022 09/08/22   Volanda Napoleon, MD  loratadine (CLARITIN) 10 MG tablet Take 10 mg by mouth in the morning.    [provider]  montelukast (SINGULAIR) 10 MG tablet Take 1 tablet (10 mg total) by mouth at bedtime. Patient taking differently: Take 10 mg by mouth See admin instructions. Take 10 mg by mouth in the morning and an additional 10 mg one hour prior to Daratumumab infusion on Mondays 09/02/22   Volanda Napoleon, MD  ondansetron (ZOFRAN) 8 MG tablet Take 1 tablet (8 mg total) by mouth every 8 (eight) hours as needed for nausea or vomiting. 08/31/22   Volanda Napoleon, MD  pantoprazole (PROTONIX) 40 MG tablet Take 1 tablet (40 mg total) by mouth daily. Patient taking differently: Take 40 mg by mouth daily before breakfast. 06/24/22   Marin Olp, MD  prochlorperazine (COMPAZINE) 10 MG tablet Take 1 tablet (10 mg total) by mouth every 6 (six) hours as needed for nausea or vomiting. 08/31/22   Volanda Napoleon, MD  REFRESH PLUS 0.5 % SOLN Place 1 drop into both eyes 3 (three) times daily as needed (for dryness).    [provider]  rosuvastatin (CRESTOR) 20 MG tablet Take 1 tablet (20 mg total) by mouth daily. 06/24/22   Marin Olp, MD  senna-docusate (SENOKOT-S) 8.6-50 MG tablet Take 2 tablets by mouth 2 (two) times daily. 09/23/22 10/23/22  Barb Merino, MD  tamsulosin (FLOMAX) 0.4 MG CAPS capsule Take 0.4 mg by mouth at  bedtime.    [provider]  TYLENOL 325 MG tablet Take 650 mg by mouth See admin instructions. Take 650 mg by mouth one hour prior to Daratumumab infusion on Mondays    [provider]  Zoster Vaccine Adjuvanted [facility]) injection Inject into the muscle. 10/08/22   Carlyle Basques, MD    Physical Exam  Vital Signs:  Peyten Weare does not have vital signs available for review today.  Given telephonic nature of communication, physical exam is limited. AAOx3. NAD. Normal affect.  Speech and respirations are unlabored.  Accessory Clinical Findings    None  Assessment & Plan    1.  Preoperative Cardiovascular Risk Assessment: -   Mr. Starlin's perioperative risk of a major cardiac event is 6.6% according to the Revised Cardiac Risk Index (RCRI).  Therefore, he is at high risk for perioperative complications.   His functional capacity is good at 4.64 METs according to the Duke Activity Status Index (DASI). Recommendations: According to ACC/AHA guidelines, no further cardiovascular testing needed.  The patient may proceed to surgery at acceptable risk.   Antiplatelet and/or Anticoagulation Recommendations: Clopidogrel (Plavix) can be held for 5 days prior to his surgery and resume 3 days postprocedure.   He is maintained on Plavix x 5 days which would be okay to hold for procedures or surgeries. (For bone marrow biopsy would hold a minimum of 7 days prior to procedure and likely 3 days post).    The patient was advised that if he develops new symptoms prior to surgery to contact our office to arrange for a follow-up visit, and he verbalized understanding.   A copy of this note will be routed to requesting surgeon.  Time:   Today, I have spent 6 minutes with the patient with telehealth technology discussing medical history, symptoms, and management plan.     Mable Fill, Marissa Nestle, NP  10/19/2022, 4:05 PM

## 2022-10-20 ENCOUNTER — Ambulatory Visit: Payer: Medicare Other | Attending: Cardiology | Admitting: Nurse Practitioner

## 2022-10-20 DIAGNOSIS — Z01810 Encounter for preprocedural cardiovascular examination: Secondary | ICD-10-CM | POA: Diagnosis not present

## 2022-10-22 ENCOUNTER — Inpatient Hospital Stay: Payer: Medicare Other

## 2022-10-22 ENCOUNTER — Inpatient Hospital Stay (HOSPITAL_BASED_OUTPATIENT_CLINIC_OR_DEPARTMENT_OTHER): Payer: Medicare Other | Admitting: Hematology & Oncology

## 2022-10-22 ENCOUNTER — Encounter: Payer: Self-pay | Admitting: Hematology & Oncology

## 2022-10-22 VITALS — BP 95/59 | HR 77 | Resp 18

## 2022-10-22 VITALS — BP 113/51 | HR 89 | Temp 98.3°F | Resp 18 | Ht 69.0 in | Wt 187.5 lb

## 2022-10-22 DIAGNOSIS — Z5112 Encounter for antineoplastic immunotherapy: Secondary | ICD-10-CM | POA: Diagnosis not present

## 2022-10-22 DIAGNOSIS — C9000 Multiple myeloma not having achieved remission: Secondary | ICD-10-CM | POA: Diagnosis not present

## 2022-10-22 LAB — CMP (CANCER CENTER ONLY)
ALT: 13 U/L (ref 0–44)
AST: 15 U/L (ref 15–41)
Albumin: 3.7 g/dL (ref 3.5–5.0)
Alkaline Phosphatase: 68 U/L (ref 38–126)
Anion gap: 9 (ref 5–15)
BUN: 21 mg/dL (ref 8–23)
CO2: 24 mmol/L (ref 22–32)
Calcium: 8.6 mg/dL — ABNORMAL LOW (ref 8.9–10.3)
Chloride: 106 mmol/L (ref 98–111)
Creatinine: 1.1 mg/dL (ref 0.61–1.24)
GFR, Estimated: >60 mL/min (ref >60)
Glucose, Bld: 230 mg/dL — ABNORMAL HIGH (ref 70–99)
Potassium: 3.6 mmol/L (ref 3.5–5.1)
Sodium: 139 mmol/L (ref 135–145)
Total Bilirubin: 0.5 mg/dL (ref 0.3–1.2)
Total Protein: 5.9 g/dL — ABNORMAL LOW (ref 6.5–8.1)

## 2022-10-22 LAB — CBC WITH DIFFERENTIAL (CANCER CENTER ONLY)
Abs Immature Granulocytes: 0.02 10*3/uL (ref 0.00–0.07)
Basophils Absolute: 0.1 10*3/uL (ref 0.0–0.1)
Basophils Relative: 1 %
Eosinophils Absolute: 0.4 10*3/uL (ref 0.0–0.5)
Eosinophils Relative: 6 %
HCT: 29.7 % — ABNORMAL LOW (ref 39.0–52.0)
Hemoglobin: 9.7 g/dL — ABNORMAL LOW (ref 13.0–17.0)
Immature Granulocytes: 0 %
Lymphocytes Relative: 27 %
Lymphs Abs: 1.9 10*3/uL (ref 0.7–4.0)
MCH: 31.2 pg (ref 26.0–34.0)
MCHC: 32.7 g/dL (ref 30.0–36.0)
MCV: 95.5 fL (ref 80.0–100.0)
Monocytes Absolute: 0.6 10*3/uL (ref 0.1–1.0)
Monocytes Relative: 9 %
Neutro Abs: 4 10*3/uL (ref 1.7–7.7)
Neutrophils Relative %: 57 %
Platelet Count: 149 10*3/uL — ABNORMAL LOW (ref 150–400)
RBC: 3.11 MIL/uL — ABNORMAL LOW (ref 4.22–5.81)
RDW: 14.1 % (ref 11.5–15.5)
WBC Count: 7 10*3/uL (ref 4.0–10.5)
nRBC: 0 % (ref 0.0–0.2)

## 2022-10-22 LAB — LACTATE DEHYDROGENASE: LDH: 118 U/L (ref 98–192)

## 2022-10-22 MED ORDER — DIPHENHYDRAMINE HCL 25 MG PO CAPS: 50.0000 mg | ORAL_CAPSULE | Freq: Once | ORAL | Status: DC

## 2022-10-22 MED ORDER — BORTEZOMIB CHEMO SQ INJECTION 3.5 MG (2.5MG/ML)
1.3000 mg/m2 | Freq: Once | INTRAMUSCULAR | Status: AC
  Administered 2022-10-22: 2.75 mg via SUBCUTANEOUS
  Filled 2022-10-22: qty 1.1

## 2022-10-22 MED ORDER — ACETAMINOPHEN 325 MG PO TABS: 650.0000 mg | ORAL_TABLET | Freq: Once | ORAL | Status: DC

## 2022-10-22 MED ORDER — DARATUMUMAB-HYALURONIDASE-FIHJ 1800-30000 MG-UT/15ML ~~LOC~~ SOLN
1800.0000 mg | Freq: Once | SUBCUTANEOUS | Status: AC
  Administered 2022-10-22: 1800 mg via SUBCUTANEOUS
  Filled 2022-10-22: qty 15

## 2022-10-22 MED ORDER — DEXAMETHASONE 4 MG PO TABS: 20.0000 mg | ORAL_TABLET | Freq: Once | ORAL | Status: DC

## 2022-10-22 NOTE — Progress Notes (Signed)
Hematology and Oncology Follow Up Visit  Val Schiavo [id number] Nov 04, 1940 82 y.o. 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 --Start on 10/29/2022     Interim History:  Mr. Mcwhirter is back for follow-up.  As expected, he has responded incredibly well to treatment.  His monoclonal spike is down to 0.5 g/dL.  The IgG level went from 4100 mg/dL and down to 750 mg/dL.  The Kappa light chain went from 7 mg/dL down to 1.3 mg/dL.  He tolerated treatment well.  He had a little bit of itching.  This may be from the Decadron.  He has had no problems with nausea or vomiting.  He has had no cough or shortness of breath.  He has had no leg swelling.  He has had no fever.  He has had no problems urinating.  His blood pressure was on the lower side.  He stopped the Flomax and I think diltiazem.  He has had no headache.  He has had no mouth sores.  He is anemic.  His erythropoietin level is only 10.  I suspect that this is secondary to his underlying myeloma.  I do think that he is going need Aranesp.  I will go ahead and get that started.  Overall, I would say his performance status is probably ECOG 1.    Medications:  Current Outpatient Medications:    Cholecalciferol (VITAMIN D3) 1000 units CAPS, Take 1,000 Units by mouth at bedtime., Disp: , Rfl:    clopidogrel (PLAVIX) 75 MG tablet, TAKE 1 TABLET(75 MG) BY MOUTH DAILY (Patient taking differently: Take 75 mg by mouth in the morning.), Disp: 90 tablet, Rfl: 2   Cyanocobalamin 1000 MCG TBCR, Take 1,000 mcg by mouth daily., Disp: , Rfl:    dexamethasone (DECADRON) 4 MG tablet, Take 5 tabs (20 mg) weekly the day of daratumumab. Take with breakfast. (Patient taking differently: Take 20 mg by mouth See admin instructions. Take 20 mg with breakfast by mouth  one hour prior to Daratumumab infusion on Mondays), Disp: 60 tablet, Rfl: 5   diltiazem (CARDIZEM) 60 MG tablet, Take 1 tablet (60 mg total) by mouth 4 (four) times daily. As needed for fast heart rate, Disp: 60 tablet, Rfl: 1   diphenhydrAMINE (BENADRYL ALLERGY) 25 MG tablet, Take 50 mg by mouth See admin instructions. Take 50 mg by mouth one hour prior to Daratumumab infusion on Mondays, Disp: , Rfl:    famciclovir (FAMVIR) 250 MG tablet, Take 1 tablet (250 mg total) by mouth daily. (Patient taking differently: Take 250 mg by mouth at bedtime.), Disp: 30 tablet, Rfl: 6   fluticasone (FLONASE) 50 MCG/ACT nasal spray, Place 1 spray into both nostrils daily as needed for allergies or rhinitis., Disp: , Rfl:    lenalidomide (REVLIMID) 15 MG capsule, Take 1 capsule (15 mg total) by mouth daily. [employer] Auth # [id number]    Date Obtained 08/14/2022, Disp: 21 capsule, Rfl: 0   loratadine (CLARITIN) 10 MG tablet, Take 10 mg by mouth in the morning., Disp: , Rfl:    montelukast (SINGULAIR) 10 MG tablet, Take 1 tablet (10 mg total) by mouth at bedtime. (Patient taking differently: Take 10 mg by mouth See admin instructions. Take 10 mg by mouth in the morning and an additional 10 mg one hour prior to Daratumumab infusion on Mondays), Disp: 30 tablet, Rfl:  6   pantoprazole (PROTONIX) 40 MG tablet, Take 1 tablet (40 mg total) by mouth daily. (Patient taking differently: Take 40 mg by mouth daily before breakfast.), Disp: 90 tablet, Rfl: 3   REFRESH PLUS 0.5 % SOLN, Place 1 drop into both eyes 3 (three) times daily as needed (for dryness)., Disp: , Rfl:    rosuvastatin (CRESTOR) 20 MG tablet, Take 1 tablet (20 mg total) by mouth daily., Disp: 90 tablet, Rfl: 3   senna-docusate (SENOKOT-S) 8.6-50 MG tablet, Take 2 tablets by mouth 2 (two) times daily., Disp: 120 tablet, Rfl: 0   acetaminophen (TYLENOL) 500 MG tablet, Take 500 mg by mouth 3 (three) times daily as needed for moderate pain or headache. (Patient not  taking: Reported on 10/22/2022), Disp: , Rfl:    diltiazem (DILT-XR) 120 MG 24 hr capsule, Take 1 capsule (120 mg total) by mouth daily. (Patient not taking: Reported on 10/22/2022), Disp: 90 capsule, Rfl: 0   ondansetron (ZOFRAN) 8 MG tablet, Take 1 tablet (8 mg total) by mouth every 8 (eight) hours as needed for nausea or vomiting. (Patient not taking: Reported on 10/22/2022), Disp: 30 tablet, Rfl: 1   prochlorperazine (COMPAZINE) 10 MG tablet, Take 1 tablet (10 mg total) by mouth every 6 (six) hours as needed for nausea or vomiting. (Patient not taking: Reported on 10/22/2022), Disp: 30 tablet, Rfl: 1   tamsulosin (FLOMAX) 0.4 MG CAPS capsule, Take 0.4 mg by mouth at bedtime. (Patient not taking: Reported on 10/22/2022), Disp: , Rfl:    triamcinolone cream (KENALOG) 0.1 %, Apply 1 Application topically 2 (two) times daily. For 7-10 days maximum (Patient not taking: Reported on 10/22/2022), Disp: 80 g, Rfl: 0   TYLENOL 325 MG tablet, Take 650 mg by mouth See admin instructions. Take 650 mg by mouth one hour prior to Daratumumab infusion on Mondays (Patient not taking: Reported on 10/22/2022), Disp: , Rfl:    Zoster Vaccine Adjuvanted ([place]) injection, Inject into the muscle., Disp: 1 each, Rfl: 0  Allergies:  Allergies  Allergen Reactions   Latex Other (See Comments)    "takes the skin off" (tape)    Past Medical History, Surgical history, Social history, and Family History were reviewed and updated.  Review of Systems: Review of Systems  Constitutional: Negative.   HENT:  Negative.    Eyes: Negative.   Respiratory: Negative.    Cardiovascular: Negative.   Gastrointestinal: Negative.   Endocrine: Negative.   Genitourinary: Negative.    Musculoskeletal: Negative.   Skin: Negative.   Neurological: Negative.   Hematological: Negative.   Psychiatric/Behavioral: Negative.      Physical Exam:  height is '5\' 9"'$  (1.753 m) and weight is 187 lb 8 oz (85 kg). His oral temperature is 98.3 F  (36.8 C). His blood pressure is 113/51 (abnormal) and his pulse is 89. His respiration is 18 and oxygen saturation is 100%.   Wt Readings from Last 3 Encounters:  10/22/22 187 lb 8 oz (85 kg)  10/16/22 189 lb 3.2 oz (85.8 kg)  10/08/22 191 lb 12.8 oz (87 kg)    Physical Exam Vitals reviewed.  HENT:     Head: Normocephalic and atraumatic.  Eyes:     Pupils: Pupils are equal, round, and reactive to light.  Cardiovascular:     Rate and Rhythm: Normal rate and regular rhythm.     Heart sounds: Normal heart sounds.  Pulmonary:     Effort: Pulmonary effort is normal.     Breath sounds: Normal  breath sounds.  Abdominal:     General: Bowel sounds are normal.     Palpations: Abdomen is soft.     Comments: Abdominal exam is somewhat distended.  The umbilical hernia is reducible.  He has active bowel sounds.  There is no guarding or rebound tenderness.  He has no palpable liver or spleen tip.  There is no fluid wave.    Musculoskeletal:        General: No tenderness or deformity. Normal range of motion.     Cervical back: Normal range of motion.  Lymphadenopathy:     Cervical: No cervical adenopathy.  Skin:    General: Skin is warm and dry.     Findings: No erythema or rash.  Neurological:     Mental Status: He is alert and oriented to person, place, and time.  Psychiatric:        Behavior: Behavior normal.        Thought Content: Thought content normal.        Judgment: Judgment normal.     Lab Results  Component Value Date   WBC 7.0 10/22/2022   HGB 9.7 (L) 10/22/2022   HCT 29.7 (L) 10/22/2022   MCV 95.5 10/22/2022   PLT 149 (L) 10/22/2022     Chemistry      Component Value Date/Time   NA 139 10/22/2022 0806   K 3.6 10/22/2022 0806   CL 106 10/22/2022 0806   CO2 24 10/22/2022 0806   BUN 21 10/22/2022 0806   CREATININE 1.10 10/22/2022 0806   CREATININE 1.33 (H) 01/21/2017 1616      Component Value Date/Time   CALCIUM 8.6 (L) 10/22/2022 0806   ALKPHOS 68 10/22/2022  0806   AST 15 10/22/2022 0806   ALT 13 10/22/2022 0806   BILITOT 0.5 10/22/2022 0806       Impression and Plan: Mr. Freeland is a very nice 82 year old white male.  He has IgG kappa myeloma.  I think what troubles me is his cytogenetics.  He has a very complex karyotype.  Again he probably has about 12 different abnormalities with his chromosomes in the bone marrow.  I suspect this probably will put him at high risk.  Thankfully, he has had no further problems with his hernia.  Everything looks quite good right now.  He still needs to have surgery for this.  It sounds like surgery might be in March.  We can certainly work around the surgery date as he is doing so well.  He is anemic.  He does have a very low erythropoietin level.  I will go ahead and get Aranesp started.  We will go ahead with his third cycle of treatment.  I am just very happy that he has responded.  Hopefully, as we go along and he does well, we can start pulling back a little bit on the intensity of his treatment.  I will see him back in 1 month.   Volanda Napoleon, MD 1/18/20249:06 AM

## 2022-10-22 NOTE — Patient Instructions (Signed)
[name]  Discharge Instructions: Thank you for choosing [facility] to provide your oncology and hematology care.   If you have a lab appointment with the [facility], please go directly to the [facility] and check in at the registration area.  Wear comfortable clothing and clothing appropriate for easy access to any Portacath or PICC line.   We strive to give you quality time with your provider. You may need to reschedule your appointment if you arrive late (15 or more minutes).  Arriving late affects you and other patients whose appointments are after yours.  Also, if you miss three or more appointments without notifying the office, you may be dismissed from the clinic at the provider's discretion.      For prescription refill requests, have your pharmacy contact our office and allow 72 hours for refills to be completed.    Today you received the following chemotherapy and/or immunotherapy agents Velcade, Darzalex-Faspro.      To help prevent nausea and vomiting after your treatment, we encourage you to take your nausea medication as directed.  BELOW ARE SYMPTOMS THAT SHOULD BE REPORTED IMMEDIATELY: *FEVER GREATER THAN 100.4 F (38 C) OR HIGHER *CHILLS OR SWEATING *NAUSEA AND VOMITING THAT IS NOT CONTROLLED WITH YOUR NAUSEA MEDICATION *UNUSUAL SHORTNESS OF BREATH *UNUSUAL BRUISING OR BLEEDING *URINARY PROBLEMS (pain or burning when urinating, or frequent urination) *BOWEL PROBLEMS (unusual diarrhea, constipation, pain near the anus) TENDERNESS IN MOUTH AND THROAT WITH OR WITHOUT PRESENCE OF ULCERS (sore throat, sores in mouth, or a toothache) UNUSUAL RASH, SWELLING OR PAIN  UNUSUAL VAGINAL DISCHARGE OR ITCHING   Items with * indicate a potential emergency and should be followed up as soon as possible or go to the Emergency Department if any problems should occur.  Please show the CHEMOTHERAPY ALERT CARD or IMMUNOTHERAPY ALERT CARD at  check-in to the Emergency Department and triage nurse. Should you have questions after your visit or need to cancel or reschedule your appointment, please contact [facility]  [phone] and follow the prompts.  Office hours are 8:00 a.m. to 4:30 p.m. Monday - Friday. Please note that voicemails left after 4:00 p.m. may not be returned until the following business day.  We are closed weekends and major holidays. You have access to a nurse at all times for urgent questions. Please call the main number to the clinic [phone] and follow the prompts.  For any non-urgent questions, you may also contact your provider using MyChart. We now offer e-Visits for anyone 20 and older to request care online for non-urgent symptoms. For details visit [email or web address].   Also download the MyChart app! Go to the app store, search "MyChart", open the app, select [facility], and log in with your MyChart username and password.

## 2022-10-23 ENCOUNTER — Other Ambulatory Visit: Payer: Self-pay

## 2022-10-23 LAB — IGG, IGA, IGM
IgA: 40 mg/dL — ABNORMAL LOW (ref 61–437)
IgG (Immunoglobin G), Serum: 655 mg/dL (ref 603–1613)
IgM (Immunoglobulin M), Srm: 12 mg/dL — ABNORMAL LOW (ref 15–143)

## 2022-10-23 LAB — KAPPA/LAMBDA LIGHT CHAINS
Kappa free light chain: 30.3 mg/L — ABNORMAL HIGH (ref 3.3–19.4)
Kappa, lambda light chain ratio: 2.15 — ABNORMAL HIGH (ref 0.26–1.65)
Lambda free light chains: 14.1 mg/L (ref 5.7–26.3)

## 2022-10-28 ENCOUNTER — Other Ambulatory Visit: Payer: Self-pay

## 2022-10-28 MED ORDER — LENALIDOMIDE 15 MG PO CAPS: 15.0000 mg | ORAL_CAPSULE | Freq: Every day | ORAL | 0 refills | Status: DC

## 2022-10-29 ENCOUNTER — Inpatient Hospital Stay: Payer: Medicare Other

## 2022-10-29 VITALS — BP 100/56 | HR 72 | Temp 97.6°F | Resp 17

## 2022-10-29 DIAGNOSIS — C9000 Multiple myeloma not having achieved remission: Secondary | ICD-10-CM

## 2022-10-29 DIAGNOSIS — Z5112 Encounter for antineoplastic immunotherapy: Secondary | ICD-10-CM | POA: Diagnosis not present

## 2022-10-29 LAB — IMMUNOFIXATION REFLEX, SERUM
IgA: 44 mg/dL — ABNORMAL LOW (ref 61–437)
IgG (Immunoglobin G), Serum: 734 mg/dL (ref 603–1613)
IgM (Immunoglobulin M), Srm: 10 mg/dL — ABNORMAL LOW (ref 15–143)

## 2022-10-29 LAB — CBC WITH DIFFERENTIAL (CANCER CENTER ONLY)
Abs Immature Granulocytes: 0.01 10*3/uL (ref 0.00–0.07)
Basophils Absolute: 0.1 10*3/uL (ref 0.0–0.1)
Basophils Relative: 2 %
Eosinophils Absolute: 0.7 10*3/uL — ABNORMAL HIGH (ref 0.0–0.5)
Eosinophils Relative: 14 %
HCT: 29.8 % — ABNORMAL LOW (ref 39.0–52.0)
Hemoglobin: 9.7 g/dL — ABNORMAL LOW (ref 13.0–17.0)
Immature Granulocytes: 0 %
Lymphocytes Relative: 43 %
Lymphs Abs: 2.1 10*3/uL (ref 0.7–4.0)
MCH: 31.6 pg (ref 26.0–34.0)
MCHC: 32.6 g/dL (ref 30.0–36.0)
MCV: 97.1 fL (ref 80.0–100.0)
Monocytes Absolute: 0.9 10*3/uL (ref 0.1–1.0)
Monocytes Relative: 17 %
Neutro Abs: 1.2 10*3/uL — ABNORMAL LOW (ref 1.7–7.7)
Neutrophils Relative %: 24 %
Platelet Count: 148 10*3/uL — ABNORMAL LOW (ref 150–400)
RBC: 3.07 MIL/uL — ABNORMAL LOW (ref 4.22–5.81)
RDW: 14.5 % (ref 11.5–15.5)
WBC Count: 5 10*3/uL (ref 4.0–10.5)
nRBC: 0 % (ref 0.0–0.2)

## 2022-10-29 LAB — PROTEIN ELECTROPHORESIS, SERUM, WITH REFLEX
A/G Ratio: 1.4 (ref 0.7–1.7)
Albumin ELP: 3.2 g/dL (ref 2.9–4.4)
Alpha-1-Globulin: 0.3 g/dL (ref 0.0–0.4)
Alpha-2-Globulin: 0.7 g/dL (ref 0.4–1.0)
Beta Globulin: 0.7 g/dL (ref 0.7–1.3)
Gamma Globulin: 0.7 g/dL (ref 0.4–1.8)
Globulin, Total: 2.3 g/dL (ref 2.2–3.9)
M-Spike, %: 0.4 g/dL — ABNORMAL HIGH
SPEP Interpretation: 0
Total Protein ELP: 5.5 g/dL — ABNORMAL LOW (ref 6.0–8.5)

## 2022-10-29 LAB — CMP (CANCER CENTER ONLY)
ALT: 13 U/L (ref 0–44)
AST: 13 U/L — ABNORMAL LOW (ref 15–41)
Albumin: 3.7 g/dL (ref 3.5–5.0)
Alkaline Phosphatase: 72 U/L (ref 38–126)
Anion gap: 5 (ref 5–15)
BUN: 22 mg/dL (ref 8–23)
CO2: 27 mmol/L (ref 22–32)
Calcium: 8.7 mg/dL — ABNORMAL LOW (ref 8.9–10.3)
Chloride: 106 mmol/L (ref 98–111)
Creatinine: 1.12 mg/dL (ref 0.61–1.24)
GFR, Estimated: >60 mL/min (ref >60)
Glucose, Bld: 191 mg/dL — ABNORMAL HIGH (ref 70–99)
Potassium: 4.3 mmol/L (ref 3.5–5.1)
Sodium: 138 mmol/L (ref 135–145)
Total Bilirubin: 0.7 mg/dL (ref 0.3–1.2)
Total Protein: 5.5 g/dL — ABNORMAL LOW (ref 6.5–8.1)

## 2022-10-29 MED ORDER — DIPHENHYDRAMINE HCL 25 MG PO CAPS: 50.0000 mg | ORAL_CAPSULE | Freq: Once | ORAL | Status: DC

## 2022-10-29 MED ORDER — DARBEPOETIN ALFA 300 MCG/0.6ML IJ SOSY
300.0000 ug | PREFILLED_SYRINGE | Freq: Once | INTRAMUSCULAR | Status: AC
  Administered 2022-10-29: 300 ug via SUBCUTANEOUS
  Filled 2022-10-29: qty 0.6

## 2022-10-29 MED ORDER — BORTEZOMIB CHEMO SQ INJECTION 3.5 MG (2.5MG/ML)
1.3000 mg/m2 | Freq: Once | INTRAMUSCULAR | Status: AC
  Administered 2022-10-29: 2.75 mg via SUBCUTANEOUS
  Filled 2022-10-29: qty 1.1

## 2022-10-29 MED ORDER — DEXAMETHASONE 4 MG PO TABS: 20.0000 mg | ORAL_TABLET | Freq: Once | ORAL | Status: DC

## 2022-10-29 MED ORDER — DARATUMUMAB-HYALURONIDASE-FIHJ 1800-30000 MG-UT/15ML ~~LOC~~ SOLN
1800.0000 mg | Freq: Once | SUBCUTANEOUS | Status: AC
  Administered 2022-10-29: 1800 mg via SUBCUTANEOUS
  Filled 2022-10-29: qty 15

## 2022-10-29 MED ORDER — ACETAMINOPHEN 325 MG PO TABS: 650.0000 mg | ORAL_TABLET | Freq: Once | ORAL | Status: DC

## 2022-10-29 NOTE — Patient Instructions (Addendum)
[facility] [place]  Discharge Instructions: Thank you for choosing [facility] to provide your oncology and hematology care.   If you have a lab appointment with the [facility], please go directly to the [facility] and check in at the registration area.  Wear comfortable clothing and clothing appropriate for easy access to any Portacath or PICC line.   We strive to give you quality time with your provider. You may need to reschedule your appointment if you arrive late (15 or more minutes).  Arriving late affects you and other patients whose appointments are after yours.  Also, if you miss three or more appointments without notifying the office, you may be dismissed from the clinic at the provider's discretion.      For prescription refill requests, have your pharmacy contact our office and allow 72 hours for refills to be completed.    Today you received the following chemotherapy and/or immunotherapy agents Velcade and Darzalex.      To help prevent nausea and vomiting after your treatment, we encourage you to take your nausea medication as directed.  BELOW ARE SYMPTOMS THAT SHOULD BE REPORTED IMMEDIATELY: *FEVER GREATER THAN 100.4 F (38 C) OR HIGHER *CHILLS OR SWEATING *NAUSEA AND VOMITING THAT IS NOT CONTROLLED WITH YOUR NAUSEA MEDICATION *UNUSUAL SHORTNESS OF BREATH *UNUSUAL BRUISING OR BLEEDING *URINARY PROBLEMS (pain or burning when urinating, or frequent urination) *BOWEL PROBLEMS (unusual diarrhea, constipation, pain near the anus) TENDERNESS IN MOUTH AND THROAT WITH OR WITHOUT PRESENCE OF ULCERS (sore throat, sores in mouth, or a toothache) UNUSUAL RASH, SWELLING OR PAIN  UNUSUAL VAGINAL DISCHARGE OR ITCHING   Items with * indicate a potential emergency and should be followed up as soon as possible or go to the Emergency Department if any problems should occur.  Please show the CHEMOTHERAPY ALERT CARD or IMMUNOTHERAPY ALERT  CARD at check-in to the Emergency Department and triage nurse. Should you have questions after your visit or need to cancel or reschedule your appointment, please contact [facility]  [phone] and follow the prompts.  Office hours are 8:00 a.m. to 4:30 p.m. Monday - Friday. Please note that voicemails left after 4:00 p.m. may not be returned until the following business day.  We are closed weekends and major holidays. You have access to a nurse at all times for urgent questions. Please call the main number to the clinic [phone] and follow the prompts.  For any non-urgent questions, you may also contact your provider using MyChart. We now offer e-Visits for anyone 29 and older to request care online for non-urgent symptoms. For details visit [email or web address].   Also download the MyChart app! Go to the app store, search "MyChart", open the app, select [facility], and log in with your MyChart username and password. [facility] [place]  Discharge Instructions: Thank you for choosing [facility] to provide your oncology and hematology care.   If you have a lab appointment with the [facility], please go directly to the [facility] and check in at the registration area.  Wear comfortable clothing and clothing appropriate for easy access to any Portacath or PICC line.   We strive to give you quality time with your provider. You may need to reschedule your appointment if you arrive late (15 or more minutes).  Arriving late affects you and other patients whose appointments are after yours.  Also, if  you miss three or more appointments without notifying the office, you may be dismissed from the clinic at the provider's discretion.      For prescription refill requests, have your pharmacy contact our office and allow 72 hours for refills to be completed.    Today you received the following chemotherapy and/or  immunotherapy agents faspro, velcade      To help prevent nausea and vomiting after your treatment, we encourage you to take your nausea medication as directed.  BELOW ARE SYMPTOMS THAT SHOULD BE REPORTED IMMEDIATELY: *FEVER GREATER THAN 100.4 F (38 C) OR HIGHER *CHILLS OR SWEATING *NAUSEA AND VOMITING THAT IS NOT CONTROLLED WITH YOUR NAUSEA MEDICATION *UNUSUAL SHORTNESS OF BREATH *UNUSUAL BRUISING OR BLEEDING *URINARY PROBLEMS (pain or burning when urinating, or frequent urination) *BOWEL PROBLEMS (unusual diarrhea, constipation, pain near the anus) TENDERNESS IN MOUTH AND THROAT WITH OR WITHOUT PRESENCE OF ULCERS (sore throat, sores in mouth, or a toothache) UNUSUAL RASH, SWELLING OR PAIN  UNUSUAL VAGINAL DISCHARGE OR ITCHING   Items with * indicate a potential emergency and should be followed up as soon as possible or go to the Emergency Department if any problems should occur.  Please show the CHEMOTHERAPY ALERT CARD or IMMUNOTHERAPY ALERT CARD at check-in to the Emergency Department and triage nurse. Should you have questions after your visit or need to cancel or reschedule your appointment, please contact [facility]  [phone] and follow the prompts.  Office hours are 8:00 a.m. to 4:30 p.m. Monday - Friday. Please note that voicemails left after 4:00 p.m. may not be returned until the following business day.  We are closed weekends and major holidays. You have access to a nurse at all times for urgent questions. Please call the main number to the clinic [phone] and follow the prompts.  For any non-urgent questions, you may also contact your provider using MyChart. We now offer e-Visits for anyone 108 and older to request care online for non-urgent symptoms. For details visit [email or web address].   Also download the MyChart app! Go to the app store, search "MyChart", open the app, select [facility], and log in with your MyChart username  and password.  Darbepoetin Alfa Injection What is this medication? DARBEPOETIN ALFA (dar be POE e tin AL fa) treats low levels of red blood cells (anemia) caused by kidney disease or chemotherapy. It works by Building control surveyor make more red blood cells, which reduces the need for blood transfusions. This medicine may be used for other purposes; ask your health care provider or pharmacist if you have questions. COMMON BRAND NAME(S): Aranesp What should I tell my care team before I take this medication? They need to know if you have any of these conditions: Blood clots Cancer Heart disease High blood pressure On dialysis Seizures Stroke An unusual or allergic reaction to darbepoetin, latex, other medications, foods, dyes, or preservatives Pregnant or trying to get pregnant Breast-feeding How should I use this medication? This medication is injected into a vein or under the skin. It is usually given by a care team in a hospital or clinic setting. It may also be given at home. If you get this medication at home, you will be taught how to prepare and give it. Use exactly as directed. Take it as directed on the prescription label at the same time every day. Keep taking it unless your care team tells you to stop. It is important that you put your used needles and  syringes in a special sharps container. Do not put them in a trash can. If you do not have a sharps container, call your pharmacist or care team to get one. A special MedGuide will be given to you by the pharmacist with each prescription and refill. Be sure to read this information carefully each time. Talk to your care team about the use of this medication in children. While this medication may be used in children as young as 71 month of age for selected conditions, precautions do apply. Overdosage: If you think you have taken too much of this medicine contact a poison control center or emergency room at once. NOTE: This medicine is only  for you. Do not share this medicine with others. What if I miss a dose? If you miss a dose, take it as soon as you can. If it is almost time for your next dose, take only that dose. Do not take double or extra doses. What may interact with this medication? Epoetin alfa Methoxy polyethylene glycol-epoetin beta This list may not describe all possible interactions. Give your health care provider a list of all the medicines, herbs, non-prescription drugs, or dietary supplements you use. Also tell them if you smoke, drink alcohol, or use illegal drugs. Some items may interact with your medicine. What should I watch for while using this medication? Visit your care team for regular checks on your progress. Check your blood pressure as directed. Know what your blood pressure should be and when to contact your care team. Your condition will be monitored carefully while you are receiving this medication. You may need blood work while taking this medication. What side effects may I notice from receiving this medication? Side effects that you should report to your care team as soon as possible: Allergic reactions--skin rash, itching, hives, swelling of the face, lips, tongue, or throat Blood clot--pain, swelling, or warmth in the leg, shortness of breath, chest pain Heart attack--pain or tightness in the chest, shoulders, arms, or jaw, nausea, shortness of breath, cold or clammy skin, feeling faint or lightheaded Increase in blood pressure Rash, fever, and swollen lymph nodes Redness, blistering, peeling, or loosening of the skin, including inside the mouth Seizures Stroke--sudden numbness or weakness of the face, arm, or leg, trouble speaking, confusion, trouble walking, loss of balance or coordination, dizziness, severe headache, change in vision Side effects that usually do not require medical attention (report to your care team if they continue or are bothersome): Cough Stomach pain Swelling of the  ankles, hands, or feet This list may not describe all possible side effects. Call your doctor for medical advice about side effects. You may report side effects to FDA at 1-800-FDA-1088. Where should I keep my medication? Keep out of the reach of children and pets. Store in a refrigerator. Do not freeze. Do not shake. Protect from light. Keep this medication in the original container until you are ready to take it. See product for storage information. Get rid of any unused medication after the expiration date. To get rid of medications that are no longer needed or have expired: Take the medication to a medication take-back program. Check with your pharmacy or law enforcement to find a location. If you cannot return the medication, ask your pharmacist or care team how to get rid of the medication safely. NOTE: This sheet is a summary. It may not cover all possible information. If you have questions about this medicine, talk to your doctor, pharmacist, or health care  provider.  2023 Elsevier/Gold Standard (2021-12-30 00:00:00)

## 2022-10-29 NOTE — Progress Notes (Signed)
Reviewed patient labs with Dr. Marin Olp and pt ok to treat with ANC 1.2

## 2022-11-04 NOTE — Progress Notes (Signed)
The patient has been taking dexamethasone at home prior to daratumumab injections. After discussion with the provider, the offset time of daratumumab was changed to 30 minutes, starting 11/05/2022, in order to reduce patient wait time. A calendar was not sent to the patient because he has already been taking dexamethasone at home.  Laray Anger, PharmD PGY-2 Pharmacy Resident Hematology/Oncology [phone]  11/04/2022 11:12 AM

## 2022-11-05 ENCOUNTER — Inpatient Hospital Stay: Payer: Medicare Other | Attending: Hematology & Oncology

## 2022-11-05 ENCOUNTER — Inpatient Hospital Stay: Payer: Medicare Other

## 2022-11-05 VITALS — BP 104/57 | HR 65 | Temp 97.9°F | Resp 16

## 2022-11-05 DIAGNOSIS — D469 Myelodysplastic syndrome, unspecified: Secondary | ICD-10-CM | POA: Insufficient documentation

## 2022-11-05 DIAGNOSIS — Z5112 Encounter for antineoplastic immunotherapy: Secondary | ICD-10-CM | POA: Diagnosis present

## 2022-11-05 DIAGNOSIS — C9000 Multiple myeloma not having achieved remission: Secondary | ICD-10-CM

## 2022-11-05 DIAGNOSIS — D630 Anemia in neoplastic disease: Secondary | ICD-10-CM | POA: Diagnosis not present

## 2022-11-05 LAB — CMP (CANCER CENTER ONLY)
ALT: 13 U/L (ref 0–44)
AST: 13 U/L — ABNORMAL LOW (ref 15–41)
Albumin: 3.9 g/dL (ref 3.5–5.0)
Alkaline Phosphatase: 73 U/L (ref 38–126)
Anion gap: 6 (ref 5–15)
BUN: 18 mg/dL (ref 8–23)
CO2: 26 mmol/L (ref 22–32)
Calcium: 9.2 mg/dL (ref 8.9–10.3)
Chloride: 108 mmol/L (ref 98–111)
Creatinine: 1.11 mg/dL (ref 0.61–1.24)
GFR, Estimated: >60 mL/min (ref >60)
Glucose, Bld: 189 mg/dL — ABNORMAL HIGH (ref 70–99)
Potassium: 4.5 mmol/L (ref 3.5–5.1)
Sodium: 140 mmol/L (ref 135–145)
Total Bilirubin: 0.5 mg/dL (ref 0.3–1.2)
Total Protein: 6 g/dL — ABNORMAL LOW (ref 6.5–8.1)

## 2022-11-05 LAB — CBC WITH DIFFERENTIAL (CANCER CENTER ONLY)
Abs Immature Granulocytes: 0.01 10*3/uL (ref 0.00–0.07)
Basophils Absolute: 0.2 10*3/uL — ABNORMAL HIGH (ref 0.0–0.1)
Basophils Relative: 3 %
Eosinophils Absolute: 0.4 10*3/uL (ref 0.0–0.5)
Eosinophils Relative: 7 %
HCT: 33.9 % — ABNORMAL LOW (ref 39.0–52.0)
Hemoglobin: 10.9 g/dL — ABNORMAL LOW (ref 13.0–17.0)
Immature Granulocytes: 0 %
Lymphocytes Relative: 32 %
Lymphs Abs: 1.9 10*3/uL (ref 0.7–4.0)
MCH: 31.5 pg (ref 26.0–34.0)
MCHC: 32.2 g/dL (ref 30.0–36.0)
MCV: 98 fL (ref 80.0–100.0)
Monocytes Absolute: 0.9 10*3/uL (ref 0.1–1.0)
Monocytes Relative: 16 %
Neutro Abs: 2.5 10*3/uL (ref 1.7–7.7)
Neutrophils Relative %: 42 %
Platelet Count: 250 10*3/uL (ref 150–400)
RBC: 3.46 MIL/uL — ABNORMAL LOW (ref 4.22–5.81)
RDW: 15.9 % — ABNORMAL HIGH (ref 11.5–15.5)
WBC Count: 5.8 10*3/uL (ref 4.0–10.5)
nRBC: 0 % (ref 0.0–0.2)

## 2022-11-05 MED ORDER — DIPHENHYDRAMINE HCL 25 MG PO CAPS: 50.0000 mg | ORAL_CAPSULE | Freq: Once | ORAL | Status: DC

## 2022-11-05 MED ORDER — BORTEZOMIB CHEMO SQ INJECTION 3.5 MG (2.5MG/ML)
1.3000 mg/m2 | Freq: Once | INTRAMUSCULAR | Status: AC
  Administered 2022-11-05: 2.75 mg via SUBCUTANEOUS
  Filled 2022-11-05: qty 1.1

## 2022-11-05 MED ORDER — DARATUMUMAB-HYALURONIDASE-FIHJ 1800-30000 MG-UT/15ML ~~LOC~~ SOLN
1800.0000 mg | Freq: Once | SUBCUTANEOUS | Status: AC
  Administered 2022-11-05: 1800 mg via SUBCUTANEOUS
  Filled 2022-11-05: qty 15

## 2022-11-05 MED ORDER — ACETAMINOPHEN 325 MG PO TABS: 650.0000 mg | ORAL_TABLET | Freq: Once | ORAL | Status: DC

## 2022-11-09 ENCOUNTER — Other Ambulatory Visit: Payer: Self-pay

## 2022-11-09 DIAGNOSIS — C9000 Multiple myeloma not having achieved remission: Secondary | ICD-10-CM

## 2022-11-09 MED ORDER — HYDROCORTISONE 1 % EX OINT: 1.0000 | TOPICAL_OINTMENT | Freq: Two times a day (BID) | CUTANEOUS | 0 refills | Status: DC

## 2022-11-09 MED ORDER — METHYLPREDNISOLONE 4 MG PO TBPK: ORAL_TABLET | ORAL | 0 refills | Status: DC

## 2022-11-09 NOTE — Telephone Encounter (Signed)
Patient called stating he has a red rash on abdomen and back. States it is very itchy and has tried triamcinolone ointment with no relief. Dicussed with MD. Order for medrol dose pak and hydrocortisone sent to pharmacy. Pt understand and declines any other questions or concerns at this time.

## 2022-11-19 ENCOUNTER — Inpatient Hospital Stay: Payer: Medicare Other

## 2022-11-19 ENCOUNTER — Inpatient Hospital Stay (HOSPITAL_BASED_OUTPATIENT_CLINIC_OR_DEPARTMENT_OTHER): Payer: Medicare Other | Admitting: Hematology & Oncology

## 2022-11-19 ENCOUNTER — Encounter: Payer: Self-pay | Admitting: Hematology & Oncology

## 2022-11-19 VITALS — BP 100/49 | HR 74 | Temp 98.6°F | Resp 20 | Ht 69.0 in | Wt 188.1 lb

## 2022-11-19 VITALS — BP 94/55 | HR 67 | Resp 18

## 2022-11-19 DIAGNOSIS — C9000 Multiple myeloma not having achieved remission: Secondary | ICD-10-CM

## 2022-11-19 DIAGNOSIS — Z5112 Encounter for antineoplastic immunotherapy: Secondary | ICD-10-CM | POA: Diagnosis not present

## 2022-11-19 LAB — CBC WITH DIFFERENTIAL (CANCER CENTER ONLY)
Abs Immature Granulocytes: 0.07 10*3/uL (ref 0.00–0.07)
Basophils Absolute: 0.2 10*3/uL — ABNORMAL HIGH (ref 0.0–0.1)
Basophils Relative: 3 %
Eosinophils Absolute: 0.8 10*3/uL — ABNORMAL HIGH (ref 0.0–0.5)
Eosinophils Relative: 11 %
HCT: 37.7 % — ABNORMAL LOW (ref 39.0–52.0)
Hemoglobin: 11.9 g/dL — ABNORMAL LOW (ref 13.0–17.0)
Immature Granulocytes: 1 %
Lymphocytes Relative: 22 %
Lymphs Abs: 1.5 10*3/uL (ref 0.7–4.0)
MCH: 31.2 pg (ref 26.0–34.0)
MCHC: 31.6 g/dL (ref 30.0–36.0)
MCV: 99 fL (ref 80.0–100.0)
Monocytes Absolute: 0.6 10*3/uL (ref 0.1–1.0)
Monocytes Relative: 9 %
Neutro Abs: 3.6 10*3/uL (ref 1.7–7.7)
Neutrophils Relative %: 54 %
Platelet Count: 122 10*3/uL — ABNORMAL LOW (ref 150–400)
RBC: 3.81 MIL/uL — ABNORMAL LOW (ref 4.22–5.81)
RDW: 15.1 % (ref 11.5–15.5)
WBC Count: 6.7 10*3/uL (ref 4.0–10.5)
nRBC: 0 % (ref 0.0–0.2)

## 2022-11-19 LAB — CMP (CANCER CENTER ONLY)
ALT: 14 U/L (ref 0–44)
AST: 14 U/L — ABNORMAL LOW (ref 15–41)
Albumin: 4.1 g/dL (ref 3.5–5.0)
Alkaline Phosphatase: 77 U/L (ref 38–126)
Anion gap: 10 (ref 5–15)
BUN: 21 mg/dL (ref 8–23)
CO2: 26 mmol/L (ref 22–32)
Calcium: 8.7 mg/dL — ABNORMAL LOW (ref 8.9–10.3)
Chloride: 103 mmol/L (ref 98–111)
Creatinine: 1.07 mg/dL (ref 0.61–1.24)
GFR, Estimated: >60 mL/min (ref >60)
Glucose, Bld: 166 mg/dL — ABNORMAL HIGH (ref 70–99)
Potassium: 3.4 mmol/L — ABNORMAL LOW (ref 3.5–5.1)
Sodium: 139 mmol/L (ref 135–145)
Total Bilirubin: 0.9 mg/dL (ref 0.3–1.2)
Total Protein: 6.1 g/dL — ABNORMAL LOW (ref 6.5–8.1)

## 2022-11-19 LAB — RETICULOCYTES
Immature Retic Fract: 4.8 % (ref 2.3–15.9)
RBC.: 3.83 MIL/uL — ABNORMAL LOW (ref 4.22–5.81)
Retic Count, Absolute: 37.9 10*3/uL (ref 19.0–186.0)
Retic Ct Pct: 1 % (ref 0.4–3.1)

## 2022-11-19 LAB — IRON AND IRON BINDING CAPACITY (CC-WL,HP ONLY)
Iron: 126 ug/dL (ref 45–182)
Saturation Ratios: 46 % — ABNORMAL HIGH (ref 17.9–39.5)
TIBC: 276 ug/dL (ref 250–450)
UIBC: 150 ug/dL (ref 117–376)

## 2022-11-19 LAB — FERRITIN: Ferritin: 93 ng/mL (ref 24–336)

## 2022-11-19 LAB — LACTATE DEHYDROGENASE: LDH: 116 U/L (ref 98–192)

## 2022-11-19 MED ORDER — ACETAMINOPHEN 325 MG PO TABS: 650.0000 mg | ORAL_TABLET | Freq: Once | ORAL | Status: DC

## 2022-11-19 MED ORDER — DIPHENHYDRAMINE HCL 25 MG PO CAPS: 50.0000 mg | ORAL_CAPSULE | Freq: Once | ORAL | Status: DC

## 2022-11-19 MED ORDER — DARATUMUMAB-HYALURONIDASE-FIHJ 1800-30000 MG-UT/15ML ~~LOC~~ SOLN
1800.0000 mg | Freq: Once | SUBCUTANEOUS | Status: AC
  Administered 2022-11-19: 1800 mg via SUBCUTANEOUS
  Filled 2022-11-19: qty 15

## 2022-11-19 MED ORDER — BORTEZOMIB CHEMO SQ INJECTION 3.5 MG (2.5MG/ML)
1.3000 mg/m2 | Freq: Once | INTRAMUSCULAR | Status: AC
  Administered 2022-11-19: 2.75 mg via SUBCUTANEOUS
  Filled 2022-11-19: qty 1.1

## 2022-11-19 MED ORDER — KETOCONAZOLE 2 % EX CREA: 1.0000 | TOPICAL_CREAM | Freq: Every day | CUTANEOUS | 2 refills | Status: DC

## 2022-11-19 NOTE — Progress Notes (Signed)
Hematology and Oncology Follow Up Visit  Dreshon Schey [id number] [birth year] 82 y.o. 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 --Start on 10/29/2022     Interim History:  Mr. Schilling is back for follow-up.  He will have his abdominal hernia repair on March 8.  As such, we will adjust his chemotherapy cycle this cycle.  He has responded incredibly well to treatment.  His last monoclonal spike was down to 0.4 g/dL.  The IgG level was down to 700 mg/dL.  The Kappa light chain was 3 mg/dL.  He has had this rash on his abdominal wall.  Is hard to say what this rash is.  It is not shingles.  There is no blisters.  Almost looks fungal.  I told him to stop the Revlimid for right now.  We will go ahead and try him on a antifungal cream to see if this may help.  He was down in [place].  He was down there for a [employer].  He had a wonderful time.  I am just glad that he has responded so well to treatment.  His hemoglobin is done incredibly well.  He has had no cough or shortness of breath.  He has had no nausea or vomiting.  He has had no change in bowel or bladder habits.  He has had no bleeding.  There is been no fever.  Overall, I would say his performance status is ECOG 1.   .    Medications:  Current Outpatient Medications:    acetaminophen (TYLENOL) 500 MG tablet, Take 500 mg by mouth 3 (three) times daily as needed for moderate pain or headache., Disp: , Rfl:    Cholecalciferol (VITAMIN D3) 1000 units CAPS, Take 1,000 Units by mouth at bedtime., Disp: , Rfl:    clopidogrel (PLAVIX) 75 MG tablet, TAKE 1 TABLET(75 MG) BY MOUTH DAILY (Patient taking differently: Take 75 mg by mouth in the morning.), Disp: 90 tablet, Rfl: 2   Cyanocobalamin  1000 MCG TBCR, Take 1,000 mcg by mouth daily., Disp: , Rfl:    dexamethasone (DECADRON) 4 MG tablet, Take 5 tabs (20 mg) weekly the day of daratumumab. Take with breakfast. (Patient taking differently: Take 20 mg by mouth See admin instructions. Take 20 mg with breakfast by mouth one hour prior to Daratumumab infusion on Mondays), Disp: 60 tablet, Rfl: 5   diphenhydrAMINE (BENADRYL ALLERGY) 25 MG tablet, Take 50 mg by mouth See admin instructions. Take 50 mg by mouth one hour prior to Daratumumab infusion on Mondays, Disp: , Rfl:    Docusate Sodium (DSS) 100 MG CAPS, Take by mouth daily., Disp: , Rfl:    famciclovir (FAMVIR) 250 MG tablet, Take 1 tablet (250 mg total) by mouth daily. (Patient taking differently: Take 250 mg by mouth at bedtime.), Disp: 30 tablet, Rfl: 6   fluticasone (FLONASE) 50 MCG/ACT nasal spray, Place 1 spray into both nostrils daily as needed for allergies or rhinitis., Disp: , Rfl:    hydrocortisone 1 % ointment, Apply 1 Application topically 2 (two) times daily., Disp: 30 g, Rfl: 0   lenalidomide (REVLIMID) 15 MG capsule, Take 1 capsule (15 mg total) by mouth daily. Fanny Dance # [id number]   Date Obtained 10/28/2022 (Patient taking differently: Take 15 mg by mouth daily. [employer]  Auth # [id number]   Date Obtained 10/28/2022 11/19/2022 On 21 days, off 7 days.), Disp: 21 capsule, Rfl: 0   loratadine (CLARITIN) 10 MG tablet, Take 10 mg by mouth in the morning., Disp: , Rfl:    montelukast (SINGULAIR) 10 MG tablet, Take 1 tablet (10 mg total) by mouth at bedtime. (Patient taking differently: Take 10 mg by mouth See admin instructions. Take 10 mg by mouth in the morning and an additional 10 mg one hour prior to Daratumumab infusion on Mondays), Disp: 30 tablet, Rfl: 6   pantoprazole (PROTONIX) 40 MG tablet, Take 1 tablet (40 mg total) by mouth daily. (Patient taking differently: Take 40 mg by mouth daily before breakfast.), Disp: 90 tablet, Rfl: 3   REFRESH PLUS 0.5 % SOLN, Place 1 drop into  both eyes 3 (three) times daily as needed (for dryness)., Disp: , Rfl:    rosuvastatin (CRESTOR) 20 MG tablet, Take 1 tablet (20 mg total) by mouth daily., Disp: 90 tablet, Rfl: 3   senna-docusate (SENOKOT-S) 8.6-50 MG tablet, Take 2 tablets by mouth 2 (two) times daily., Disp: , Rfl:    tamsulosin (FLOMAX) 0.4 MG CAPS capsule, Take 0.4 mg by mouth at bedtime., Disp: , Rfl:    triamcinolone cream (KENALOG) 0.1 %, Apply 1 Application topically 2 (two) times daily. For 7-10 days maximum, Disp: 80 g, Rfl: 0   TYLENOL 325 MG tablet, Take 650 mg by mouth See admin instructions. Take 650 mg by mouth one hour prior to Daratumumab infusion on Mondays, Disp: , Rfl:    diltiazem (CARDIZEM) 60 MG tablet, Take 1 tablet (60 mg total) by mouth 4 (four) times daily. As needed for fast heart rate (Patient not taking: Reported on 11/19/2022), Disp: 60 tablet, Rfl: 1   diltiazem (DILT-XR) 120 MG 24 hr capsule, Take 1 capsule (120 mg total) by mouth daily. (Patient not taking: Reported on 10/22/2022), Disp: 90 capsule, Rfl: 0   ondansetron (ZOFRAN) 8 MG tablet, Take 1 tablet (8 mg total) by mouth every 8 (eight) hours as needed for nausea or vomiting. (Patient not taking: Reported on 10/22/2022), Disp: 30 tablet, Rfl: 1   prochlorperazine (COMPAZINE) 10 MG tablet, Take 1 tablet (10 mg total) by mouth every 6 (six) hours as needed for nausea or vomiting. (Patient not taking: Reported on 10/22/2022), Disp: 30 tablet, Rfl: 1  Allergies:  Allergies  Allergen Reactions   Latex Other (See Comments)    "takes the skin off" (tape)    Past Medical History, Surgical history, Social history, and Family History were reviewed and updated.  Review of Systems: Review of Systems  Constitutional: Negative.   HENT:  Negative.    Eyes: Negative.   Respiratory: Negative.    Cardiovascular: Negative.   Gastrointestinal: Negative.   Endocrine: Negative.   Genitourinary: Negative.    Musculoskeletal: Negative.   Skin: Negative.    Neurological: Negative.   Hematological: Negative.   Psychiatric/Behavioral: Negative.      Physical Exam:  height is 5' 9"$  (1.753 m) and weight is 188 lb 1.3 oz (85.3 kg). His oral temperature is 98.6 F (37 C). His blood pressure is 100/49 (abnormal) and his pulse is 74. His respiration is 20 and oxygen saturation is 100%.   Wt Readings from Last 3 Encounters:  11/19/22 188 lb 1.3 oz (85.3 kg)  10/22/22 187 lb 8 oz (85 kg)  10/16/22 189 lb 3.2 oz (85.8 kg)    Physical Exam Vitals reviewed.  HENT:  Head: Normocephalic and atraumatic.  Eyes:     Pupils: Pupils are equal, round, and reactive to light.  Cardiovascular:     Rate and Rhythm: Normal rate and regular rhythm.     Heart sounds: Normal heart sounds.  Pulmonary:     Effort: Pulmonary effort is normal.     Breath sounds: Normal breath sounds.  Abdominal:     General: Bowel sounds are normal.     Palpations: Abdomen is soft.     Comments: Abdominal exam is somewhat distended.  The umbilical hernia is reducible.  He has active bowel sounds.  There is no guarding or rebound tenderness.  He has no palpable liver or spleen tip.  There is no fluid wave.    Musculoskeletal:        General: No tenderness or deformity. Normal range of motion.     Cervical back: Normal range of motion.  Lymphadenopathy:     Cervical: No cervical adenopathy.  Skin:    General: Skin is warm and dry.     Findings: No erythema or rash.  Neurological:     Mental Status: He is alert and oriented to person, place, and time.  Psychiatric:        Behavior: Behavior normal.        Thought Content: Thought content normal.        Judgment: Judgment normal.     Lab Results  Component Value Date   WBC 6.7 11/19/2022   HGB 11.9 (L) 11/19/2022   HCT 37.7 (L) 11/19/2022   MCV 99.0 11/19/2022   PLT 122 (L) 11/19/2022     Chemistry      Component Value Date/Time   NA 139 11/19/2022 0852   K 3.4 (L) 11/19/2022 0852   CL 103 11/19/2022 0852    CO2 26 11/19/2022 0852   BUN 21 11/19/2022 0852   CREATININE 1.07 11/19/2022 0852   CREATININE 1.33 (H) 01/21/2017 1616      Component Value Date/Time   CALCIUM 8.7 (L) 11/19/2022 0852   ALKPHOS 77 11/19/2022 0852   AST 14 (L) 11/19/2022 0852   ALT 14 11/19/2022 0852   BILITOT 0.9 11/19/2022 0852       Impression and Plan: Mr. Lubin is a very nice 82 year old white male.  He has IgG kappa myeloma.  I think what troubles me is his cytogenetics.  He has a very complex karyotype.  Again he probably has about 12 different abnormalities with his chromosomes in the bone marrow.  I suspect this probably will put him at high risk.  We will go ahead with his fourth cycle of treatment.  Again, we will adjust his protocol so that he can have his surgery safely.  We will only give him 2 weeks of treatment.  I would then start his next cycle about 2 weeks after his surgery.  Again he will hold the Revlimid until we see him back.    Volanda Napoleon, MD 2/15/202410:09 AM

## 2022-11-19 NOTE — Patient Instructions (Signed)
[facility] [place]  Discharge Instructions: Thank you for choosing [facility] to provide your oncology and hematology care.   If you have a lab appointment with the [facility], please go directly to the [facility] and check in at the registration area.  Wear comfortable clothing and clothing appropriate for easy access to any Portacath or PICC line.   We strive to give you quality time with your provider. You may need to reschedule your appointment if you arrive late (15 or more minutes).  Arriving late affects you and other patients whose appointments are after yours.  Also, if you miss three or more appointments without notifying the office, you may be dismissed from the clinic at the provider's discretion.      For prescription refill requests, have your pharmacy contact our office and allow 72 hours for refills to be completed.    Today you received the following chemotherapy and/or immunotherapy agents Velcade, Darzalex.      To help prevent nausea and vomiting after your treatment, we encourage you to take your nausea medication as directed.  BELOW ARE SYMPTOMS THAT SHOULD BE REPORTED IMMEDIATELY: *FEVER GREATER THAN 100.4 F (38 C) OR HIGHER *CHILLS OR SWEATING *NAUSEA AND VOMITING THAT IS NOT CONTROLLED WITH YOUR NAUSEA MEDICATION *UNUSUAL SHORTNESS OF BREATH *UNUSUAL BRUISING OR BLEEDING *URINARY PROBLEMS (pain or burning when urinating, or frequent urination) *BOWEL PROBLEMS (unusual diarrhea, constipation, pain near the anus) TENDERNESS IN MOUTH AND THROAT WITH OR WITHOUT PRESENCE OF ULCERS (sore throat, sores in mouth, or a toothache) UNUSUAL RASH, SWELLING OR PAIN  UNUSUAL VAGINAL DISCHARGE OR ITCHING   Items with * indicate a potential emergency and should be followed up as soon as possible or go to the Emergency Department if any problems should occur.  Please show the CHEMOTHERAPY ALERT CARD or IMMUNOTHERAPY ALERT CARD  at check-in to the Emergency Department and triage nurse. Should you have questions after your visit or need to cancel or reschedule your appointment, please contact [facility]  [phone] and follow the prompts.  Office hours are 8:00 a.m. to 4:30 p.m. Monday - Friday. Please note that voicemails left after 4:00 p.m. may not be returned until the following business day.  We are closed weekends and major holidays. You have access to a nurse at all times for urgent questions. Please call the main number to the clinic [phone] and follow the prompts.  For any non-urgent questions, you may also contact your provider using MyChart. We now offer e-Visits for anyone 37 and older to request care online for non-urgent symptoms. For details visit [email or web address].   Also download the MyChart app! Go to the app store, search "MyChart", open the app, select [facility], and log in with your MyChart username and password.

## 2022-11-20 LAB — KAPPA/LAMBDA LIGHT CHAINS
Kappa free light chain: 24 mg/L — ABNORMAL HIGH (ref 3.3–19.4)
Kappa, lambda light chain ratio: 2.03 — ABNORMAL HIGH (ref 0.26–1.65)
Lambda free light chains: 11.8 mg/L (ref 5.7–26.3)

## 2022-11-21 LAB — IGG, IGA, IGM
IgA: 41 mg/dL — ABNORMAL LOW (ref 61–437)
IgG (Immunoglobin G), Serum: 606 mg/dL (ref 603–1613)
IgM (Immunoglobulin M), Srm: 6 mg/dL — ABNORMAL LOW (ref 15–143)

## 2022-11-25 ENCOUNTER — Telehealth: Payer: Self-pay | Admitting: *Deleted

## 2022-11-25 NOTE — Telephone Encounter (Signed)
Call placed to [facility] pharmacy to notify them that Revlimid is on hold per order of Dr. Marin Olp until pt is seen again in this office on 12/24/22.

## 2022-11-26 ENCOUNTER — Inpatient Hospital Stay: Payer: Medicare Other

## 2022-11-26 ENCOUNTER — Telehealth: Payer: Self-pay

## 2022-11-26 VITALS — BP 102/64 | HR 69 | Temp 98.0°F | Resp 18

## 2022-11-26 DIAGNOSIS — C9000 Multiple myeloma not having achieved remission: Secondary | ICD-10-CM

## 2022-11-26 DIAGNOSIS — Z5112 Encounter for antineoplastic immunotherapy: Secondary | ICD-10-CM | POA: Diagnosis not present

## 2022-11-26 LAB — CMP (CANCER CENTER ONLY)
ALT: 12 U/L (ref 0–44)
AST: 13 U/L — ABNORMAL LOW (ref 15–41)
Albumin: 4 g/dL (ref 3.5–5.0)
Alkaline Phosphatase: 65 U/L (ref 38–126)
Anion gap: 6 (ref 5–15)
BUN: 15 mg/dL (ref 8–23)
CO2: 27 mmol/L (ref 22–32)
Calcium: 9.3 mg/dL (ref 8.9–10.3)
Chloride: 108 mmol/L (ref 98–111)
Creatinine: 1.1 mg/dL (ref 0.61–1.24)
GFR, Estimated: >60 mL/min (ref >60)
Glucose, Bld: 140 mg/dL — ABNORMAL HIGH (ref 70–99)
Potassium: 4.9 mmol/L (ref 3.5–5.1)
Sodium: 141 mmol/L (ref 135–145)
Total Bilirubin: 0.4 mg/dL (ref 0.3–1.2)
Total Protein: 5.7 g/dL — ABNORMAL LOW (ref 6.5–8.1)

## 2022-11-26 LAB — CBC WITH DIFFERENTIAL (CANCER CENTER ONLY)
Abs Immature Granulocytes: 0.01 10*3/uL (ref 0.00–0.07)
Basophils Absolute: 0.1 10*3/uL (ref 0.0–0.1)
Basophils Relative: 2 %
Eosinophils Absolute: 0.7 10*3/uL — ABNORMAL HIGH (ref 0.0–0.5)
Eosinophils Relative: 16 %
HCT: 35.9 % — ABNORMAL LOW (ref 39.0–52.0)
Hemoglobin: 11.5 g/dL — ABNORMAL LOW (ref 13.0–17.0)
Immature Granulocytes: 0 %
Lymphocytes Relative: 39 %
Lymphs Abs: 1.7 10*3/uL (ref 0.7–4.0)
MCH: 31.3 pg (ref 26.0–34.0)
MCHC: 32 g/dL (ref 30.0–36.0)
MCV: 97.6 fL (ref 80.0–100.0)
Monocytes Absolute: 0.7 10*3/uL (ref 0.1–1.0)
Monocytes Relative: 16 %
Neutro Abs: 1.2 10*3/uL — ABNORMAL LOW (ref 1.7–7.7)
Neutrophils Relative %: 27 %
Platelet Count: 130 10*3/uL — ABNORMAL LOW (ref 150–400)
RBC: 3.68 MIL/uL — ABNORMAL LOW (ref 4.22–5.81)
RDW: 14.7 % (ref 11.5–15.5)
WBC Count: 4.4 10*3/uL (ref 4.0–10.5)
nRBC: 0 % (ref 0.0–0.2)

## 2022-11-26 LAB — PROTEIN ELECTROPHORESIS, SERUM, WITH REFLEX
A/G Ratio: 1.6 (ref 0.7–1.7)
Albumin ELP: 3.4 g/dL (ref 2.9–4.4)
Alpha-1-Globulin: 0.2 g/dL (ref 0.0–0.4)
Alpha-2-Globulin: 0.6 g/dL (ref 0.4–1.0)
Beta Globulin: 0.7 g/dL (ref 0.7–1.3)
Gamma Globulin: 0.5 g/dL (ref 0.4–1.8)
Globulin, Total: 2.1 g/dL — ABNORMAL LOW (ref 2.2–3.9)
M-Spike, %: 0.3 g/dL — ABNORMAL HIGH
SPEP Interpretation: 0
Total Protein ELP: 5.5 g/dL — ABNORMAL LOW (ref 6.0–8.5)

## 2022-11-26 LAB — IMMUNOFIXATION REFLEX, SERUM
IgA: 47 mg/dL — ABNORMAL LOW (ref 61–437)
IgG (Immunoglobin G), Serum: 709 mg/dL (ref 603–1613)
IgM (Immunoglobulin M), Srm: 8 mg/dL — ABNORMAL LOW (ref 15–143)

## 2022-11-26 MED ORDER — DIPHENHYDRAMINE HCL 25 MG PO CAPS: 50.0000 mg | ORAL_CAPSULE | Freq: Once | ORAL | Status: DC

## 2022-11-26 MED ORDER — ACETAMINOPHEN 325 MG PO TABS: 650.0000 mg | ORAL_TABLET | Freq: Once | ORAL | Status: DC

## 2022-11-26 MED ORDER — DARATUMUMAB-HYALURONIDASE-FIHJ 1800-30000 MG-UT/15ML ~~LOC~~ SOLN
1800.0000 mg | Freq: Once | SUBCUTANEOUS | Status: AC
  Administered 2022-11-26: 1800 mg via SUBCUTANEOUS
  Filled 2022-11-26: qty 15

## 2022-11-26 MED ORDER — BORTEZOMIB CHEMO SQ INJECTION 3.5 MG (2.5MG/ML)
1.3000 mg/m2 | Freq: Once | INTRAMUSCULAR | Status: AC
  Administered 2022-11-26: 2.75 mg via SUBCUTANEOUS
  Filled 2022-11-26: qty 1.1

## 2022-11-26 NOTE — Patient Instructions (Signed)
Daratumumab Injection What is this medication? DARATUMUMAB (dar a toom ue mab) treats multiple myeloma, a type of bone marrow cancer. It works by helping your immune system slow or stop the spread of cancer cells. It is a monoclonal antibody. This medicine may be used for other purposes; ask your health care provider or pharmacist if you have questions. COMMON BRAND NAME(S): DARZALEX What should I tell my care team before I take this medication? They need to know if you have any of these conditions: Hereditary fructose intolerance Infection, such as chickenpox, herpes, hepatitis B virus Lung or breathing disease, such as asthma, COPD An unusual or allergic reaction to daratumumab, sorbitol, other medications, foods, dyes, or preservatives Pregnant or trying to get pregnant Breast-feeding How should I use this medication? This medication is injected into a vein. It is given by your care team in a hospital or clinic setting. Talk to your care team about the use of this medication in children. Special care may be needed. Overdosage: If you think you have taken too much of this medicine contact a poison control center or emergency room at once. NOTE: This medicine is only for you. Do not share this medicine with others. What if I miss a dose? Keep appointments for follow-up doses. It is important not to miss your dose. Call your care team if you are unable to keep an appointment. What may interact with this medication? Interactions have not been studied. This list may not describe all possible interactions. Give your health care provider a list of all the medicines, herbs, non-prescription drugs, or dietary supplements you use. Also tell them if you smoke, drink alcohol, or use illegal drugs. Some items may interact with your medicine. What should I watch for while using this medication? Your condition will be monitored carefully while you are receiving this medication. This medication can cause  serious allergic reactions. To reduce your risk, your care team may give you other medication to take before receiving this one. Be sure to follow the directions from your care team. This medication can affect the results of blood tests to match your blood type. These changes can last for up to 6 months after the final dose. Your care team will do blood tests to match your blood type before you start treatment. Tell all of your care team that you are being treated with this medication before receiving a blood transfusion. This medication can affect the results of some tests used to determine treatment response; extra tests may be needed to evaluate response. Talk to your care team if you wish to become pregnant or think you are pregnant. This medication can cause serious birth defects if taken during pregnancy and for 3 months after the last dose. A reliable form of contraception is recommended while taking this medication and for 3 months after the last dose. Talk to your care team about effective forms of contraception. Do not breast-feed while taking this medication. What side effects may I notice from receiving this medication? Side effects that you should report to your care team as soon as possible: Allergic reactions--skin rash, itching, hives, swelling of the face, lips, tongue, or throat Infection--fever, chills, cough, sore throat, wounds that don't heal, pain or trouble when passing urine, general feeling of discomfort or being unwell Infusion reactions--chest pain, shortness of breath or trouble breathing, feeling faint or lightheaded Unusual bruising or bleeding Side effects that usually do not require medical attention (report to your care team if they continue   or are bothersome): Constipation Diarrhea Fatigue Nausea Pain, tingling, or numbness in the hands or feet Swelling of the ankles, hands, or feet This list may not describe all possible side effects. Call your doctor for medical  advice about side effects. You may report side effects to FDA at 1-800-FDA-1088. Where should I keep my medication? This medication is given in a hospital or clinic. It will not be stored at home. NOTE: This sheet is a summary. It may not cover all possible information. If you have questions about this medicine, talk to your doctor, pharmacist, or health care provider.  2023 Elsevier/Gold Standard (2022-01-14 00:00:00) Bortezomib Injection What is this medication? BORTEZOMIB (bor TEZ oh mib) treats lymphoma. It may also be used to treat multiple myeloma, a type of bone marrow cancer. It works by blocking a protein that causes cancer cells to grow and multiply. This helps to slow or stop the spread of cancer cells. This medicine may be used for other purposes; ask your health care provider or pharmacist if you have questions. COMMON BRAND NAME(S): Velcade What should I tell my care team before I take this medication? They need to know if you have any of these conditions: Dehydration Diabetes Heart disease Liver disease Tingling of the fingers or toes or other nerve disorder An unusual or allergic reaction to bortezomib, other medications, foods, dyes, or preservatives If you or your partner are pregnant or trying to get pregnant Breastfeeding How should I use this medication? This medication is injected into a vein or under the skin. It is given by your care team in a hospital or clinic setting. Talk to your care team about the use of this medication in children. Special care may be needed. Overdosage: If you think you have taken too much of this medicine contact a poison control center or emergency room at once. NOTE: This medicine is only for you. Do not share this medicine with others. What if I miss a dose? Keep appointments for follow-up doses. It is important not to miss your dose. Call your care team if you are unable to keep an appointment. What may interact with this  medication? Ketoconazole Rifampin This list may not describe all possible interactions. Give your health care provider a list of all the medicines, herbs, non-prescription drugs, or dietary supplements you use. Also tell them if you smoke, drink alcohol, or use illegal drugs. Some items may interact with your medicine. What should I watch for while using this medication? Your condition will be monitored carefully while you are receiving this medication. You may need blood work while taking this medication. This medication may affect your coordination, reaction time, or judgment. Do not drive or operate machinery until you know how this medication affects you. Sit up or stand slowly to reduce the risk of dizzy or fainting spells. Drinking alcohol with this medication can increase the risk of these side effects. This medication may increase your risk of getting an infection. Call your care team for advice if you get a fever, chills, sore throat, or other symptoms of a cold or flu. Do not treat yourself. Try to avoid being around people who are sick. Check with your care team if you have severe diarrhea, nausea, and vomiting, or if you sweat a lot. The loss of too much body fluid may make it dangerous for you to take this medication. Talk to your care team if you may be pregnant. Serious birth defects can occur if you take this   medication during pregnancy and for 7 months after the last dose. You will need a negative pregnancy test before starting this medication. Contraception is recommended while taking this medication and for 7 months after the last dose. Your care team can help you find the option that works for you. If your partner can get pregnant, use a condom during sex while taking this medication and for 4 months after the last dose. Do not breastfeed while taking this medication and for 2 months after the last dose. This medication may cause infertility. Talk to your care team if you are  concerned about your fertility. What side effects may I notice from receiving this medication? Side effects that you should report to your care team as soon as possible: Allergic reactions--skin rash, itching, hives, swelling of the face, lips, tongue, or throat Bleeding--bloody or black, tar-like stools, vomiting blood or brown material that looks like coffee grounds, red or dark brown urine, small red or purple spots on skin, unusual bruising or bleeding Bleeding in the brain--severe headache, stiff neck, confusion, dizziness, change in vision, numbness or weakness of the face, arm, or leg, trouble speaking, trouble walking, vomiting Bowel blockage--stomach cramping, unable to have a bowel movement or pass gas, loss of appetite, vomiting Heart failure--shortness of breath, swelling of the ankles, feet, or hands, sudden weight gain, unusual weakness or fatigue Infection--fever, chills, cough, sore throat, wounds that don't heal, pain or trouble when passing urine, general feeling of discomfort or being unwell Liver injury--right upper belly pain, loss of appetite, nausea, light-colored stool, dark yellow or brown urine, yellowing skin or eyes, unusual weakness or fatigue Low blood pressure--dizziness, feeling faint or lightheaded, blurry vision Lung injury--shortness of breath or trouble breathing, cough, spitting up blood, chest pain, fever Pain, tingling, or numbness in the hands or feet Severe or prolonged diarrhea Stomach pain, bloody diarrhea, pale skin, unusual weakness or fatigue, decrease in the amount of urine, which may be signs of hemolytic uremic syndrome Sudden and severe headache, confusion, change in vision, seizures, which may be signs of posterior reversible encephalopathy syndrome (PRES) TTP--purple spots on the skin or inside the mouth, pale skin, yellowing skin or eyes, unusual weakness or fatigue, fever, fast or irregular heartbeat, confusion, change in vision, trouble speaking,  trouble walking Tumor lysis syndrome (TLS)--nausea, vomiting, diarrhea, decrease in the amount of urine, dark urine, unusual weakness or fatigue, confusion, muscle pain or cramps, fast or irregular heartbeat, joint pain Side effects that usually do not require medical attention (report to your care team if they continue or are bothersome): Constipation Diarrhea Fatigue Loss of appetite Nausea This list may not describe all possible side effects. Call your doctor for medical advice about side effects. You may report side effects to FDA at 1-800-FDA-1088. Where should I keep my medication? This medication is given in a hospital or clinic. It will not be stored at home. NOTE: This sheet is a summary. It may not cover all possible information. If you have questions about this medicine, talk to your doctor, pharmacist, or health care provider.  2023 Elsevier/Gold Standard (2022-02-18 00:00:00)  

## 2022-11-26 NOTE — Telephone Encounter (Signed)
Advised via MyChart.

## 2022-11-26 NOTE — Telephone Encounter (Signed)
-----   Message from Volanda Napoleon, MD sent at 11/26/2022  4:14 PM EST ----- Please call let him know that the myeloma keeps getting better.  His abnormal protein is now down to 0.3.  This is fantastic news.  Thanks.  Laurey Arrow

## 2022-11-26 NOTE — Progress Notes (Signed)
OK to treat with ANC value from today per Dr. Marin Olp.

## 2022-12-01 ENCOUNTER — Other Ambulatory Visit: Payer: Self-pay

## 2022-12-01 ENCOUNTER — Encounter (HOSPITAL_BASED_OUTPATIENT_CLINIC_OR_DEPARTMENT_OTHER): Payer: Self-pay | Admitting: Surgery

## 2022-12-01 NOTE — Progress Notes (Signed)
Chart reviewed with Dr. Smith Robert, anesthesiologist, including 10/19/22 cardiology note by Ambrose Pancoast, NP. Per Dr. Smith Robert, okay to proceed with surgery at [facility] as planned d/t patient denies any cardiac symptoms at this time.

## 2022-12-04 NOTE — Progress Notes (Signed)

## 2022-12-07 NOTE — Anesthesia Preprocedure Evaluation (Signed)
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 No wall motion abnormalities  The left ventricular ejection fraction is normal (55-65%).  There was no ST segment deviation noted during stress.  Defect 1: There is a small defect of mild severity present in the mid anteroseptal and apex location. The mid anteroseptal region likely represents old anterior infarct. Apical thinning present.  This is a low risk study. No significant ischemia identified.    Neuro/Psych negative neurological ROS  negative psych ROS   GI/Hepatic Neg liver ROS, hiatal hernia,GERD  Medicated and Controlled,,  Endo/Other  diabetes  Multiple myeloma- currently being treated  Renal/GU negative Renal ROS  negative genitourinary   Musculoskeletal  (+) Arthritis , Osteoarthritis,    Abdominal   Peds  Hematology  (+) Blood dyscrasia, anemia Hb 11.5, plt 130   Anesthesia Other Findings   Reproductive/Obstetrics negative OB ROS                              Anesthesia Physical Anesthesia Plan  ASA: 3  Anesthesia Plan: General   Post-op Pain Management: Tylenol PO (pre-op)*   Induction: Intravenous  PONV Risk  Score and Plan: 2 and Ondansetron and Treatment may vary due to age or medical condition  Airway Management Planned: Oral ETT  Additional Equipment: None  Intra-op Plan:   Post-operative Plan: Extubation in OR  Informed Consent: I have reviewed the patients History and Physical, chart, labs and discussed the procedure including the risks, benefits and alternatives for the proposed anesthesia with the patient or authorized representative who has indicated his/her understanding and acceptance.     Dental advisory given  Plan Discussed with: CRNA  Anesthesia Plan Comments:          Anesthesia Quick Evaluation

## 2022-12-08 ENCOUNTER — Ambulatory Visit (HOSPITAL_BASED_OUTPATIENT_CLINIC_OR_DEPARTMENT_OTHER)
Admission: RE | Admit: 2022-12-08 | Discharge: 2022-12-08 | Disposition: A | Discharge status: Home / Self Care | Payer: Medicare Other | Attending: Surgery | Admitting: Surgery

## 2022-12-08 ENCOUNTER — Encounter (HOSPITAL_BASED_OUTPATIENT_CLINIC_OR_DEPARTMENT_OTHER): Payer: Self-pay | Admitting: Surgery

## 2022-12-08 ENCOUNTER — Ambulatory Visit (HOSPITAL_BASED_OUTPATIENT_CLINIC_OR_DEPARTMENT_OTHER): Payer: Medicare Other | Admitting: Anesthesiology

## 2022-12-08 ENCOUNTER — Encounter (HOSPITAL_BASED_OUTPATIENT_CLINIC_OR_DEPARTMENT_OTHER)
Admission: RE | Disposition: A | Discharge status: Home / Self Care | Payer: Self-pay | Source: Home / Self Care | Attending: Surgery

## 2022-12-08 ENCOUNTER — Other Ambulatory Visit: Payer: Self-pay

## 2022-12-08 DIAGNOSIS — I252 Old myocardial infarction: Secondary | ICD-10-CM

## 2022-12-08 DIAGNOSIS — E119 Type 2 diabetes mellitus without complications: Secondary | ICD-10-CM | POA: Insufficient documentation

## 2022-12-08 DIAGNOSIS — K420 Umbilical hernia with obstruction, without gangrene: Secondary | ICD-10-CM | POA: Diagnosis present

## 2022-12-08 DIAGNOSIS — Z7902 Long term (current) use of antithrombotics/antiplatelets: Secondary | ICD-10-CM | POA: Diagnosis not present

## 2022-12-08 DIAGNOSIS — D759 Disease of blood and blood-forming organs, unspecified: Secondary | ICD-10-CM | POA: Diagnosis not present

## 2022-12-08 DIAGNOSIS — C9000 Multiple myeloma not having achieved remission: Secondary | ICD-10-CM | POA: Diagnosis not present

## 2022-12-08 DIAGNOSIS — I2510 Atherosclerotic heart disease of native coronary artery without angina pectoris: Secondary | ICD-10-CM

## 2022-12-08 DIAGNOSIS — D649 Anemia, unspecified: Secondary | ICD-10-CM | POA: Insufficient documentation

## 2022-12-08 DIAGNOSIS — Z01818 Encounter for other preprocedural examination: Secondary | ICD-10-CM

## 2022-12-08 HISTORY — DX: Pneumonia, unspecified organism: J18.9

## 2022-12-08 HISTORY — DX: Presence of other cardiac implants and grafts: Z95.818

## 2022-12-08 HISTORY — PX: UMBILICAL HERNIA REPAIR: SHX196

## 2022-12-08 SURGERY — REPAIR, HERNIA, UMBILICAL, ADULT
Anesthesia: General | Site: Abdomen

## 2022-12-08 MED ORDER — ACETAMINOPHEN 500 MG PO TABS
ORAL_TABLET | ORAL | Status: AC
  Filled 2022-12-08: qty 2

## 2022-12-08 MED ORDER — ONDANSETRON HCL 4 MG/2ML IJ SOLN: 4.0000 mg | Freq: Once | INTRAMUSCULAR | Status: DC | PRN

## 2022-12-08 MED ORDER — FENTANYL CITRATE (PF) 100 MCG/2ML IJ SOLN: 25.0000 ug | INTRAMUSCULAR | Status: DC | PRN

## 2022-12-08 MED ORDER — LIDOCAINE HCL (CARDIAC) PF 100 MG/5ML IV SOSY
PREFILLED_SYRINGE | INTRAVENOUS | Status: DC | PRN
  Administered 2022-12-08: 60 mg via INTRAVENOUS

## 2022-12-08 MED ORDER — BUPIVACAINE HCL (PF) 0.25 % IJ SOLN
INTRAMUSCULAR | Status: DC | PRN
  Administered 2022-12-08: 10 mL

## 2022-12-08 MED ORDER — ONDANSETRON HCL 4 MG/2ML IJ SOLN
INTRAMUSCULAR | Status: DC | PRN
  Administered 2022-12-08: 4 mg via INTRAVENOUS

## 2022-12-08 MED ORDER — ROCURONIUM BROMIDE 100 MG/10ML IV SOLN
INTRAVENOUS | Status: DC | PRN
  Administered 2022-12-08: 100 mg via INTRAVENOUS

## 2022-12-08 MED ORDER — ACETAMINOPHEN 500 MG PO TABS: 1000.0000 mg | ORAL_TABLET | ORAL | Status: AC

## 2022-12-08 MED ORDER — PHENYLEPHRINE HCL (PRESSORS) 10 MG/ML IV SOLN
INTRAVENOUS | Status: DC | PRN
  Administered 2022-12-08: 40 ug via INTRAVENOUS
  Administered 2022-12-08: 80 ug via INTRAVENOUS
  Administered 2022-12-08 (×4): 40 ug via INTRAVENOUS

## 2022-12-08 MED ORDER — ACETAMINOPHEN 500 MG PO TABS
1000.0000 mg | ORAL_TABLET | Freq: Once | ORAL | Status: AC
  Administered 2022-12-08: 1000 mg via ORAL

## 2022-12-08 MED ORDER — DEXAMETHASONE SODIUM PHOSPHATE 4 MG/ML IJ SOLN
INTRAMUSCULAR | Status: DC | PRN
  Administered 2022-12-08: 4 mg via INTRAVENOUS

## 2022-12-08 MED ORDER — OXYCODONE HCL 5 MG PO TABS: 5.0000 mg | ORAL_TABLET | Freq: Once | ORAL | Status: DC | PRN

## 2022-12-08 MED ORDER — CEFAZOLIN SODIUM-DEXTROSE 2-4 GM/100ML-% IV SOLN
2.0000 g | INTRAVENOUS | Status: AC
  Administered 2022-12-08: 2 g via INTRAVENOUS

## 2022-12-08 MED ORDER — FENTANYL CITRATE (PF) 100 MCG/2ML IJ SOLN
INTRAMUSCULAR | Status: DC | PRN
  Administered 2022-12-08: 50 ug via INTRAVENOUS

## 2022-12-08 MED ORDER — CHLORHEXIDINE GLUCONATE CLOTH 2 % EX PADS: 6.0000 | MEDICATED_PAD | Freq: Once | CUTANEOUS | Status: DC

## 2022-12-08 MED ORDER — BUPIVACAINE HCL (PF) 0.25 % IJ SOLN
INTRAMUSCULAR | Status: AC
  Filled 2022-12-08: qty 30

## 2022-12-08 MED ORDER — LACTATED RINGERS IV SOLN: INTRAVENOUS | Status: DC

## 2022-12-08 MED ORDER — 0.9 % SODIUM CHLORIDE (POUR BTL) OPTIME
TOPICAL | Status: DC | PRN
  Administered 2022-12-08: 100 mL

## 2022-12-08 MED ORDER — CEFAZOLIN SODIUM-DEXTROSE 2-4 GM/100ML-% IV SOLN
INTRAVENOUS | Status: AC
  Filled 2022-12-08: qty 100

## 2022-12-08 MED ORDER — OXYCODONE HCL 5 MG/5ML PO SOLN: 5.0000 mg | Freq: Once | ORAL | Status: DC | PRN

## 2022-12-08 MED ORDER — FENTANYL CITRATE (PF) 100 MCG/2ML IJ SOLN
INTRAMUSCULAR | Status: AC
  Filled 2022-12-08: qty 2

## 2022-12-08 MED ORDER — PROPOFOL 10 MG/ML IV BOLUS
INTRAVENOUS | Status: DC | PRN
  Administered 2022-12-08: 100 mg via INTRAVENOUS
  Administered 2022-12-08: 50 mg via INTRAVENOUS

## 2022-12-08 MED ORDER — SUGAMMADEX SODIUM 200 MG/2ML IV SOLN
INTRAVENOUS | Status: DC | PRN
  Administered 2022-12-08: 200 mg via INTRAVENOUS

## 2022-12-08 MED ORDER — OXYCODONE HCL 5 MG PO TABS: 5.0000 mg | ORAL_TABLET | Freq: Four times a day (QID) | ORAL | 0 refills | Status: DC | PRN

## 2022-12-08 MED ORDER — EPHEDRINE SULFATE (PRESSORS) 50 MG/ML IJ SOLN
INTRAMUSCULAR | Status: DC | PRN
  Administered 2022-12-08 (×2): 10 mg via INTRAVENOUS

## 2022-12-08 SURGICAL SUPPLY — 58 items
APL PRP STRL LF DISP 70% ISPRP (MISCELLANEOUS) ×1
APL SKNCLS STERI-STRIP NONHPOA (GAUZE/BANDAGES/DRESSINGS) ×1
APL SWBSTK 6 STRL LF DISP (MISCELLANEOUS)
APPLICATOR COTTON TIP 6 STRL (MISCELLANEOUS) IMPLANT
APPLICATOR COTTON TIP 6IN STRL (MISCELLANEOUS)
BENZOIN TINCTURE PRP APPL 2/3 (GAUZE/BANDAGES/DRESSINGS) ×2 IMPLANT
BLADE CLIPPER SURG (BLADE) ×2 IMPLANT
BLADE HEX COATED 2.75 (ELECTRODE) ×2 IMPLANT
BLADE SURG 15 STRL LF DISP TIS (BLADE) ×2 IMPLANT
BLADE SURG 15 STRL SS (BLADE) ×1
CANISTER SUCT 1200ML W/VALVE (MISCELLANEOUS) IMPLANT
CHLORAPREP W/TINT 26 (MISCELLANEOUS) ×2 IMPLANT
COVER BACK TABLE 60X90IN (DRAPES) ×2 IMPLANT
COVER MAYO STAND STRL (DRAPES) ×2 IMPLANT
DRAPE LAPAROTOMY 100X72 PEDS (DRAPES) ×2 IMPLANT
DRAPE UTILITY XL STRL (DRAPES) ×2 IMPLANT
DRSG TEGADERM 4X4.75 (GAUZE/BANDAGES/DRESSINGS) ×2 IMPLANT
ELECT REM PT RETURN 9FT ADLT (ELECTROSURGICAL) ×1
ELECTRODE REM PT RTRN 9FT ADLT (ELECTROSURGICAL) ×2 IMPLANT
GAUZE SPONGE 2X2 STRL 8-PLY (GAUZE/BANDAGES/DRESSINGS) ×2 IMPLANT
GAUZE SPONGE 4X4 12PLY STRL LF (GAUZE/BANDAGES/DRESSINGS) IMPLANT
GLOVE BIO SURGEON STRL SZ7 (GLOVE) ×2 IMPLANT
GLOVE BIOGEL PI IND STRL 7.0 (GLOVE) IMPLANT
GLOVE BIOGEL PI IND STRL 7.5 (GLOVE) ×2 IMPLANT
GLOVE SURG SS PI 7.0 STRL IVOR (GLOVE) IMPLANT
GOWN STRL REUS W/ TWL LRG LVL3 (GOWN DISPOSABLE) ×4 IMPLANT
GOWN STRL REUS W/TWL LRG LVL3 (GOWN DISPOSABLE) ×2
MESH VENTRALEX ST 1-7/10 CRC S (Mesh General) IMPLANT
NDL HYPO 25X1 1.5 SAFETY (NEEDLE) ×2 IMPLANT
NDL SAFETY ECLIP 18X1.5 (MISCELLANEOUS) IMPLANT
NEEDLE HYPO 25X1 1.5 SAFETY (NEEDLE) ×1 IMPLANT
NS IRRIG 1000ML POUR BTL (IV SOLUTION) IMPLANT
PACK BASIN DAY SURGERY FS (CUSTOM PROCEDURE TRAY) ×2 IMPLANT
PENCIL SMOKE EVACUATOR (MISCELLANEOUS) ×2 IMPLANT
SLEEVE SCD COMPRESS KNEE MED (STOCKING) ×2 IMPLANT
SPIKE FLUID TRANSFER (MISCELLANEOUS) IMPLANT
STAPLER VISISTAT 35W (STAPLE) IMPLANT
STRIP CLOSURE SKIN 1/2X4 (GAUZE/BANDAGES/DRESSINGS) ×2 IMPLANT
SUT MNCRL AB 4-0 PS2 18 (SUTURE) ×2 IMPLANT
SUT NOVA 0 T19/GS 22DT (SUTURE) IMPLANT
SUT NOVA NAB DX-16 0-1 5-0 T12 (SUTURE) IMPLANT
SUT NOVA NAB GS-21 1 T12 (SUTURE) IMPLANT
SUT PDS AB 0 CT 36 (SUTURE) IMPLANT
SUT PROLENE 0 CT 2 (SUTURE) IMPLANT
SUT SILK 3 0 TIES 17X18 (SUTURE)
SUT SILK 3-0 18XBRD TIE BLK (SUTURE) IMPLANT
SUT VIC AB 2-0 CT1 27 (SUTURE)
SUT VIC AB 2-0 CT1 TAPERPNT 27 (SUTURE) IMPLANT
SUT VIC AB 3-0 SH 27 (SUTURE) ×1
SUT VIC AB 3-0 SH 27X BRD (SUTURE) ×2 IMPLANT
SUT VIC AB 4-0 BRD 54 (SUTURE) IMPLANT
SUT VIC AB 4-0 SH 18 (SUTURE) IMPLANT
SUT VICRYL 4-0 PS2 18IN ABS (SUTURE) IMPLANT
SYR BULB EAR ULCER 3OZ GRN STR (SYRINGE) IMPLANT
SYR CONTROL 10ML LL (SYRINGE) ×2 IMPLANT
TOWEL GREEN STERILE FF (TOWEL DISPOSABLE) ×2 IMPLANT
TUBE CONNECTING 20X1/4 (TUBING) ×2 IMPLANT
YANKAUER SUCT BULB TIP NO VENT (SUCTIONS) ×2 IMPLANT

## 2022-12-08 NOTE — Anesthesia Postprocedure Evaluation (Signed)
Anesthesia Post Note  Patient: Alexander Armstrong  Procedure(s) Performed: UMBILICAL HERNIA REPAIR WITH MESH (Abdomen)     Patient location during evaluation: PACU Anesthesia Type: General Level of consciousness: awake and alert, oriented and patient cooperative Pain management: pain level controlled Vital Signs Assessment: post-procedure vital signs reviewed and stable Respiratory status: spontaneous breathing, nonlabored ventilation and respiratory function stable Cardiovascular status: blood pressure returned to baseline and stable Postop Assessment: no apparent nausea or vomiting Anesthetic complications: no   No notable events documented.  Last Vitals:  Vitals:   12/08/22 0915 12/08/22 1017  BP: 118/61 118/62  Pulse: 84 79  Resp: 20 20  Temp:  36.7 C  SpO2: 95% 97%    Last Pain:  Vitals:   12/08/22 1017  TempSrc: Tympanic  PainSc: 0-No pain                 Pervis Hocking

## 2022-12-08 NOTE — Discharge Instructions (Addendum)
[facility] _______[facility]  UMBILICAL HERNIA REPAIR: POST OP INSTRUCTIONS  Always review your discharge instruction sheet given to you by the facility where your surgery was performed. IF YOU HAVE DISABILITY OR FAMILY LEAVE FORMS, YOU MUST BRING THEM TO THE OFFICE FOR PROCESSING.   DO NOT GIVE THEM TO YOUR DOCTOR.  1. A  prescription for pain medication may be given to you upon discharge.  Take your pain medication as prescribed, if needed.  If narcotic pain medicine is not needed, then you may take acetaminophen (Tylenol) or ibuprofen (Advil) as needed. 2. Take your usually prescribed medications unless otherwise directed. If you need a refill on your pain medication, please contact your pharmacy.  They will contact our office to request authorization. Prescriptions will not be filled after 5 pm or on week-ends. 3. You should follow a light diet the first 24 hours after arrival home, such as soup and crackers, etc.  Be sure to include lots of fluids daily.  Resume your normal diet the day after surgery. 4.Most patients will experience some swelling and bruising around the umbilicus or in the groin and scrotum.  Ice packs and reclining will help.  Swelling and bruising can take several days to resolve.  6. It is common to experience some constipation if taking pain medication after surgery.  Increasing fluid intake and taking a stool softener (such as Colace) will usually help or prevent this problem from occurring.  A mild laxative (Milk of Magnesia or Miralax) should be taken according to package directions if there are no bowel movements after 48 hours. 7. Unless discharge instructions indicate otherwise, you may remove your bandages 24-48 hours after surgery, and you may shower at that time.  You may have steri-strips (small skin tapes) in place directly over the incision.  These strips should be left on the skin for 7-10 days.  If your surgeon used skin glue on the incision, you may  shower in 24 hours.  The glue will flake off over the next 2-3 weeks.  Any sutures or staples will be removed at the office during your follow-up visit. 8. ACTIVITIES:  You may resume regular (light) daily activities beginning the next day--such as daily self-care, walking, climbing stairs--gradually increasing activities as tolerated.  You may have sexual intercourse when it is comfortable.  Refrain from any heavy lifting or straining until approved by your doctor.  a.You may drive when you are no longer taking prescription pain medication, you can comfortably wear a seatbelt, and you can safely maneuver your car and apply brakes. b.RETURN TO WORK:   _____________________________________________  9.You should see your doctor in the office for a follow-up appointment approximately 2-3 weeks after your surgery.  Make sure that you call for this appointment within a day or two after you arrive home to insure a convenient appointment time. 10.OTHER INSTRUCTIONS: _________________________    _____________________________________  WHEN TO CALL YOUR DOCTOR: Fever over 101.0 Inability to urinate Nausea and/or vomiting Extreme swelling or bruising Continued bleeding from incision. Increased pain, redness, or drainage from the incision  The clinic staff is available to answer your questions during regular business hours.  Please don't hesitate to call and ask to speak to one of the nurses for clinical concerns.  If you have a medical emergency, go to the nearest emergency room or call 911.  A surgeon from [facility] is always on call at the hospital   [address] [place], [place], [place]  [id number] ?  [address], [place], [place]   [address] [phone] ? [phone] ? FAX [phone] Web site: [email or web address]  No Tylenol until after 12:50pm today if needed  Post Anesthesia Home Care Instructions  Activity: Get plenty of rest for the remainder of  the day. A responsible individual must stay with you for 24 hours following the procedure.  For the next 24 hours, DO NOT: -Drive a car -Paediatric nurse -Drink alcoholic beverages -Take any medication unless instructed by your physician -Make any legal decisions or sign important papers.  Meals: Start with liquid foods such as gelatin or soup. Progress to regular foods as tolerated. Avoid greasy, spicy, heavy foods. If nausea and/or vomiting occur, drink only clear liquids until the nausea and/or vomiting subsides. Call your physician if vomiting continues.  Special Instructions/Symptoms: Your throat may feel dry or sore from the anesthesia or the breathing tube placed in your throat during surgery. If this causes discomfort, gargle with warm salt water. The discomfort should disappear within 24 hours.  If you had a scopolamine patch placed behind your ear for the management of post- operative nausea and/or vomiting:  1. The medication in the patch is effective for 72 hours, after which it should be removed.  Wrap patch in a tissue and discard in the trash. Wash hands thoroughly with soap and water. 2. You may remove the patch earlier than 72 hours if you experience unpleasant side effects which may include dry mouth, dizziness or visual disturbances. 3. Avoid touching the patch. Wash your hands with soap and water after contact with the patch.

## 2022-12-08 NOTE — Anesthesia Procedure Notes (Signed)
Procedure Name: Intubation Date/Time: 12/08/2022 7:37 AM  Performed by: Quintasia Theroux, Ernesta Amble, CRNAPre-anesthesia Checklist: Patient identified, Emergency Drugs available, Suction available and Patient being monitored Patient Re-evaluated:Patient Re-evaluated prior to induction Oxygen Delivery Method: Circle system utilized Preoxygenation: Pre-oxygenation with 100% oxygen Induction Type: IV induction Ventilation: Mask ventilation without difficulty Laryngoscope Size: Mac and 3 Grade View: Grade I Tube type: Oral Tube size: 7.0 mm Number of attempts: 1 Airway Equipment and Method: Stylet and Oral airway Placement Confirmation: ETT inserted through vocal cords under direct vision, positive ETCO2 and breath sounds checked- equal and bilateral Secured at: 21 cm Tube secured with: Tape Dental Injury: Teeth and Oropharynx as per pre-operative assessment

## 2022-12-08 NOTE — H&P (Signed)
Subjective    Chief Complaint: New Consultation (umb hernia)   Oncology - [facility] - Dr. [name]- History of Present Illness: Alexander Armstrong is a 82 y.o. male who is seen today as an office consultation at the request of Dr. Marin Olp for evaluation of New Consultation (umb hernia) .   This is a 82 year old male with multiple myeloma currently undergoing treatment by Dr. Marin Olp.  He was evaluated in the emergency department on 09/21/2022 to the with a small bowel obstruction.  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  We were able to reduce the hernia and his obstruction resolved.  The patient is chronically anticoagulated on Plavix.  He is still early in his treatment protocol.   Since discharge from the hospital, he still has a little bit of soreness around his umbilicus but the hernia remains reducible.  He denies any obstructive symptoms.  He is having daily bowel movements.  Appetite and bowel movements remain normal.   The patient had primary repair of this umbilical hernia about 50 years ago.     Review of Systems: A complete review of systems was obtained from the patient.  I have reviewed this information and discussed as appropriate with the patient.  See HPI as well for other ROS.   Review of Systems  Constitutional: Negative.   HENT: Negative.    Eyes: Negative.   Respiratory: Negative.    Cardiovascular: Negative.   Gastrointestinal: Negative.   Genitourinary: Negative.   Musculoskeletal: Negative.   Skin: Negative.   Neurological: Negative.   Endo/Heme/Allergies: Negative.   Psychiatric/Behavioral: Negative.          Medical History: Past Medical History         Past Medical History:  Diagnosis Date   History of cancer               Patient Active Problem List  Diagnosis   Atherosclerotic heart disease of native coronary artery without angina pectoris   BPH associated with nocturia   Chronic respiratory  insufficiency   Gastroesophageal reflux disease without esophagitis   Hyperlipidemia associated with type 2 diabetes mellitus    History of acute anterior wall MI   Multiple myeloma (CMS-HCC)   Partial small bowel obstruction (CMS-HCC)   Type 2 diabetes mellitus without complication, without long-term current use of insulin (CMS-HCC)   Umbilical hernia      Past Surgical History           Past Surgical History:  Procedure Laterality Date   Cardiac catheterization       Coronary stent intervention       Electrophysiologic study       HOLTER MONITOR       Left heart cath and coronary angiography       Transthoracic echocardiogram            Allergies           Allergies  Allergen Reactions   Latex Other (See Comments)      "takes the skin off" (tape)   "takes the skin off"   Adhesive Tape-Silicones Rash        No current outpatient medications on file prior to visit.    No current facility-administered medications on file prior to visit.      Family History  History reviewed. No pertinent family history.      Social History         Tobacco Use  Smoking  Status Never  Smokeless Tobacco Never      Social History  Social History           Socioeconomic History   Marital status: Married  Tobacco Use   Smoking status: Never   Smokeless tobacco: Never  Substance and Sexual Activity   Alcohol use: Never   Drug use: Never        Objective:           Vitals:   Body mass index is 25.9 kg/m.   Physical Exam    Constitutional:  WDWN in NAD, conversant, no obvious deformities; lying in bed comfortably Eyes:  Pupils equal, round; sclera anicteric; moist conjunctiva; no lid lag HENT:  Oral mucosa moist; good dentition  Neck:  No masses palpated, trachea midline; no thyromegaly Lungs:  CTA bilaterally; normal respiratory effort CV:  Regular rate and rhythm; no murmurs; extremities well-perfused with no edema Abd:  +bowel sounds, soft, non-tender, no  palpable organomegaly; large umbilical hernia that is reducible with direct palpation.  The fascial defect is approximately 3 cm in diameter. Musc:  normal gait; no apparent clubbing or cyanosis in extremities Lymphatic:  No palpable cervical or axillary lymphadenopathy Skin:  Warm, dry; no sign of jaundice Psychiatric - alert and oriented x 4; calm mood and affect     Labs, Imaging and Diagnostic Testing: CLINICAL DATA:  Nausea, vomiting, abdominal distention   EXAM: CT ABDOMEN AND PELVIS WITH CONTRAST   TECHNIQUE: Multidetector CT imaging of the abdomen and pelvis was performed using the standard protocol following bolus administration of intravenous contrast.   RADIATION DOSE REDUCTION: This exam was performed according to the departmental dose-optimization program which includes automated exposure control, adjustment of the mA and/or kV according to patient size and/or use of iterative reconstruction technique.   CONTRAST:  49m OMNIPAQUE IOHEXOL 300 MG/ML  SOLN   COMPARISON:  Abdomen radiographs done today   FINDINGS: Lower chest: Small linear patchy infiltrates are seen in both lower lung fields, more so in the left lower lobe. Coronary artery calcifications are seen.   Hepatobiliary: No focal abnormalities are seen in liver. There is no dilation of bile ducts. Gallbladder is distended. There is no wall thickening in gallbladder.   Pancreas: No focal abnormalities are seen.   Spleen: Unremarkable.   Adrenals/Urinary Tract: Adrenals are unremarkable. There is no hydronephrosis. There are no renal or ureteral stones. Urinary bladder is unremarkable.   Stomach/Bowel: Small hiatal hernia is seen. Stomach is unremarkable. There is abnormal dilation of proximal small bowel loops measuring up to 5 cm in diameter. Distal small bowel loops are decompressed. There is umbilical hernia containing short segment of a small bowel loop. There is mild edema in the fat planes  within the umbilical hernia. Small bowel loops in the left side of the abdomen proximal to the hernia appear to be dilated and small bowel loops in the right side of abdomen distal to the hernia appear to be partly decompressed. Appendix is not dilated. There is no significant wall thickening in colon. Multiple diverticula are seen in left colon without signs of focal acute diverticulitis.   Vascular/Lymphatic: Scattered arterial calcifications are seen in aorta and its major branches.   Reproductive: Coarse calcification is seen in prostate.   Other: There is no ascites or pneumoperitoneum. Right inguinal hernia containing fat is seen.   Musculoskeletal: Schmorl's node is seen in the upper endplate of body of L2 vertebra. Degenerative changes are noted with encroachment of neural foramina  at multiple levels, more so at L4-L5 and L5-S1 levels.   IMPRESSION: There is abnormal dilation of proximal small bowel loops with decompression of distal small bowel loops. There is umbilical hernia containing short segment of small bowel loop. Findings suggest partial obstruction of small bowel related to incarcerated obstructing umbilical hernia. Surgical consultation should be considered.   There is no hydronephrosis. Appendix is not dilated. 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 Small hiatal hernia.   There are linear densities in the lower lung fields suggesting subsegmental atelectasis. Minimal right pleural effusion. Coronary artery disease. Aortic arteriosclerosis.   Imaging findings were relayed to patient's provider in the emergency room by telephone call.     Electronically Signed   By: Elmer Picker M.D.   On: 09/21/2022 16:53   Assessment and Plan:  Diagnoses and all orders for this visit:   Umbilical hernia without obstruction or gangrene    Umbilical hernia repair with mesh.  The surgical procedure has been discussed with the patient.   Potential risks, benefits, alternative treatments, and expected outcomes have been explained.  All of the patient's questions at this time have been answered.  The likelihood of reaching the patient's treatment goal is good.  The patient understand the proposed surgical procedure and wishes to proceed.    Imogene Burn. Georgette Dover, MD, [facility]  General Surgery   12/08/2022 7:10 AM

## 2022-12-08 NOTE — Op Note (Signed)
Indications:  This is a 82 year old male with multiple myeloma currently undergoing treatment by Dr. Marin Olp.  He was evaluated in the emergency department on 09/21/2022 to the with a small bowel obstruction.  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  We were able to reduce the hernia and his obstruction resolved.  The patient is chronically anticoagulated on Plavix.  He is still early in his treatment protocol.   Since discharge from the hospital, he still has a little bit of soreness around his umbilicus but the hernia remains reducible.  He denies any obstructive symptoms.  He is having daily bowel movements.  Appetite and bowel movements remain normal.   The patient had primary repair of this umbilical hernia about 50 years ago.  The patient was examined and we recommended umbilical hernia repair with mesh.  Pre-operative diagnosis:  Recurrent incarcerated umbilical hernia  Post-operative diagnosis:  Same  Procedure:  Recurrent umbilical hernia repair with mesh  Anesthesia:  General  Procedure Details  The patient was seen again in the Holding Room. The risks, benefits, complications, treatment options, and expected outcomes were discussed with the patient. The possibilities of reaction to medication, pulmonary aspiration, perforation of viscus, bleeding, recurrent infection, the need for additional procedures, and development of a complication requiring transfusion or further operation were discussed with the patient and/or family. There was concurrence with the proposed plan, and informed consent was obtained. The site of surgery was properly noted/marked. The patient was taken to the Operating Room, identified as [name], and the procedure verified as umbilical hernia repair. A Time Out was held and the above information confirmed.  After an adequate level of general anesthesia was obtained, the patient's abdomen was prepped with Chloraprep and  draped in sterile fashion.  We anesthetized with local anesthetic.  We made a transverse incision below the umbilicus through the old incision.  Dissection was carried down to the hernia sac with cautery.  We dissected bluntly around the hernia sac down to the edge of the fascial defect.  The hernia defect is quite large (5 cm) and is densely adherent to the posterior surface of the dermis of the umbilicus.  We carefully peeled the hernia sac away from the dermis.  We then opened the hernia sac which contained mostly omentum.  We dissected the omentum away from the hernia sac and reduced it back into the peritoneal cavity.  One section of transverse colon had an appendices epiploicae adherent to the sac which was also dissected away and reduced.  The hernia sac was amputated at the fascial edge.  The fascial defect measured 2.5 cm.  We cleared the fascia in all directions.  A small Ventralex mesh was inserted into the pre-peritoneal space and was deployed.  The mesh was secured with four trans-fascial sutures of 0 Novofil.  The fascial defect was closed with multiple interrupted figure-of-eight 0 Novofil sutures.  The base of the umbilicus was tacked down with 3-0 Vicryl.  3-0 Vicryl was used to close the subcutaneous tissues and 4-0 Monocryl was used to close the skin.  Steri-strips and clean dressing were applied.  The patient was extubated and brought to the recovery room in stable condition.  All sponge, instrument, and needle counts were correct prior to closure and at the conclusion of the case.   Estimated Blood Loss: less than 50 mL          Complications: None; patient tolerated the procedure well.  Disposition: PACU - hemodynamically stable.         Condition: stable  Imogene Burn. Georgette Dover, MD, [facility]  General Surgery   12/08/2022 8:46 AM

## 2022-12-08 NOTE — Transfer of Care (Signed)
Immediate Anesthesia Transfer of Care Note  Patient: [name]  Procedure(s) Performed: UMBILICAL HERNIA REPAIR WITH MESH (Abdomen)  Patient Location: PACU  Anesthesia Type:General  Level of Consciousness: drowsy and patient cooperative  Airway & Oxygen Therapy: Patient Spontanous Breathing and Patient connected to face mask oxygen  Post-op Assessment: Report given to RN and Post -op Vital signs reviewed and stable  Post vital signs: Reviewed and stable  Last Vitals:  Vitals Value Taken Time  BP    Temp    Pulse 87 12/08/22 0851  Resp 16 12/08/22 0851  SpO2 99 % 12/08/22 0851  Vitals shown include unvalidated device data.  Last Pain:  Vitals:   12/08/22 0641  TempSrc: Temporal  PainSc: 0-No pain      Patients Stated Pain Goal: 3 ([id number])  Complications: No notable events documented.

## 2022-12-09 ENCOUNTER — Encounter (HOSPITAL_BASED_OUTPATIENT_CLINIC_OR_DEPARTMENT_OTHER): Payer: Self-pay | Admitting: Surgery

## 2022-12-23 ENCOUNTER — Encounter: Payer: Self-pay | Admitting: Family Medicine

## 2022-12-23 ENCOUNTER — Ambulatory Visit (INDEPENDENT_AMBULATORY_CARE_PROVIDER_SITE_OTHER): Payer: Medicare Other | Admitting: Family Medicine

## 2022-12-23 VITALS — BP 120/62 | HR 71 | Temp 97.9°F | Ht 69.0 in | Wt 189.0 lb

## 2022-12-23 DIAGNOSIS — C9000 Multiple myeloma not having achieved remission: Secondary | ICD-10-CM | POA: Diagnosis not present

## 2022-12-23 DIAGNOSIS — Z79899 Other long term (current) drug therapy: Secondary | ICD-10-CM

## 2022-12-23 DIAGNOSIS — I2510 Atherosclerotic heart disease of native coronary artery without angina pectoris: Secondary | ICD-10-CM | POA: Diagnosis not present

## 2022-12-23 DIAGNOSIS — E559 Vitamin D deficiency, unspecified: Secondary | ICD-10-CM | POA: Insufficient documentation

## 2022-12-23 DIAGNOSIS — R351 Nocturia: Secondary | ICD-10-CM

## 2022-12-23 DIAGNOSIS — E119 Type 2 diabetes mellitus without complications: Secondary | ICD-10-CM | POA: Diagnosis not present

## 2022-12-23 DIAGNOSIS — E1169 Type 2 diabetes mellitus with other specified complication: Secondary | ICD-10-CM

## 2022-12-23 DIAGNOSIS — E785 Hyperlipidemia, unspecified: Secondary | ICD-10-CM

## 2022-12-23 DIAGNOSIS — N401 Enlarged prostate with lower urinary tract symptoms: Secondary | ICD-10-CM

## 2022-12-23 LAB — LIPID PANEL
Cholesterol: 142 mg/dL (ref 0–200)
HDL: 41.6 mg/dL (ref >39.00)
LDL Cholesterol: 78 mg/dL (ref 0–99)
NonHDL: 100.8
Total CHOL/HDL Ratio: 3
Triglycerides: 116 mg/dL (ref 0.0–149.0)
VLDL: 23.2 mg/dL (ref 0.0–40.0)

## 2022-12-23 LAB — VITAMIN D 25 HYDROXY (VIT D DEFICIENCY, FRACTURES): VITD: 32.13 ng/mL (ref 30.00–100.00)

## 2022-12-23 LAB — HEMOGLOBIN A1C: Hgb A1c MFr Bld: 7.1 % — ABNORMAL HIGH (ref 4.6–6.5)

## 2022-12-23 LAB — VITAMIN B12: Vitamin B-12: 1021 pg/mL — ABNORMAL HIGH (ref 211–911)

## 2022-12-23 NOTE — Progress Notes (Signed)
Phone [phone] In person visit   Subjective:   Hildred Wicke is a 82 y.o. year old very pleasant male patient who presents for/with See problem oriented charting Chief Complaint  Patient presents with   Follow-up   Diabetes   Past Medical History-  Patient Active Problem List   Diagnosis Date Noted   Partial small bowel obstruction ([place]) 09/21/2022    Priority: High   Multiple myeloma ([place]) 08/13/2022    Priority: High   Wide-complex tachycardia - WCT ([place]) 12/09/2016    Priority: High   Cardiac syncope 03/10/2016    Priority: High   Cardiac device in situ 11/18/2015    Priority: High   Type 2 diabetes mellitus without complication, without long-term current use of insulin ([place]) 11/18/2015    Priority: High   Paroxysmal VT ([place]) 09/17/2015    Priority: High   History of acute anterior wall MI 01/26/2008    Priority: High   Coronary artery disease involving native coronary artery of native heart without angina pectoris 11/03/2004    Priority: High   Vitamin D deficiency 12/23/2022    Priority: Medium    Hypotension 02/22/2018    Priority: Medium    BPH associated with nocturia 11/18/2015    Priority: Medium    Gastroesophageal reflux disease without esophagitis 11/18/2015    Priority: Medium    Hyperlipidemia associated with type 2 diabetes mellitus ([place]) 01/26/2008    Priority: Medium    ESOPHAGEAL STRICTURE 12/26/2007    Priority: Medium    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    Priority: Medium    Exercise intolerance 02/20/2019    Priority: Low   Keratoconjunctivitis sicca of both eyes not specified as Sjogren's 10/08/2017    Priority: Low   Urge incontinence 12/09/2016    Priority: Low   Palpitations 11/18/2015    Priority: Low   PVD (posterior vitreous detachment), both eyes 11/18/2015    Priority: Low   Umbilical hernia [id number]    Priority: Low   Personal history of other malignant neoplasm of skin 07/20/2011    Priority: Low   HIATAL HERNIA  12/26/2007    Priority: Low   DIVERTICULOSIS, COLON 12/26/2007    Priority: Low   Family history of glaucoma 10/08/2017    Priority: 1.   Other acquired hammer toe 05/27/2017    Priority: 1.   Acute right-sided low back pain without sciatica 11/18/2015    Priority: 1.   Ingrown nail 11/18/2015    Priority: 1.    Medications- reviewed and updated Current Outpatient Medications  Medication Sig Dispense Refill   acetaminophen (TYLENOL) 500 MG tablet Take 500 mg by mouth 3 (three) times daily as needed for moderate pain or headache.     Cholecalciferol (VITAMIN D3) 1000 units CAPS Take 1,000 Units by mouth at bedtime.     clopidogrel (PLAVIX) 75 MG tablet TAKE 1 TABLET(75 MG) BY MOUTH DAILY (Patient taking differently: Take 75 mg by mouth in the morning.) 90 tablet 2   Cyanocobalamin 1000 MCG TBCR Take 1,000 mcg by mouth daily.     dexamethasone (DECADRON) 4 MG tablet Take 5 tabs (20 mg) weekly the day of daratumumab. Take with breakfast. (Patient taking differently: Take 20 mg by mouth See admin instructions. Take 20 mg with breakfast by mouth one hour prior to Daratumumab infusion on Mondays) 60 tablet 5   diltiazem (CARDIZEM) 60 MG tablet Take 1 tablet (60 mg total) by mouth 4 (four) times daily. As needed for fast heart rate 60  tablet 1   diphenhydrAMINE (BENADRYL ALLERGY) 25 MG tablet Take 50 mg by mouth See admin instructions. Take 50 mg by mouth one hour prior to Daratumumab infusion on Mondays     Docusate Sodium (DSS) 100 MG CAPS Take by mouth daily.     famciclovir (FAMVIR) 250 MG tablet Take 1 tablet (250 mg total) by mouth daily. (Patient taking differently: Take 250 mg by mouth at bedtime.) 30 tablet 6   fluticasone (FLONASE) 50 MCG/ACT nasal spray Place 1 spray into both nostrils daily as needed for allergies or rhinitis.     lenalidomide (REVLIMID) 15 MG capsule Take 1 capsule (15 mg total) by mouth daily. Fanny Dance # [id number]   Date Obtained 10/28/2022 (Patient taking  differently: Take 15 mg by mouth daily. [employer] Auth # [id number]   Date Obtained 10/28/2022 11/19/2022 On 21 days, off 7 days.) 21 capsule 0   loratadine (CLARITIN) 10 MG tablet Take 10 mg by mouth in the morning.     MAGNESIUM PO Take by mouth daily.     montelukast (SINGULAIR) 10 MG tablet Take 1 tablet (10 mg total) by mouth at bedtime. (Patient taking differently: Take 10 mg by mouth See admin instructions. Take 10 mg by mouth in the morning and an additional 10 mg one hour prior to Daratumumab infusion on Mondays) 30 tablet 6   pantoprazole (PROTONIX) 40 MG tablet Take 1 tablet (40 mg total) by mouth daily. (Patient taking differently: Take 40 mg by mouth daily before breakfast.) 90 tablet 3   prochlorperazine (COMPAZINE) 10 MG tablet Take 1 tablet (10 mg total) by mouth every 6 (six) hours as needed for nausea or vomiting. 30 tablet 1   REFRESH PLUS 0.5 % SOLN Place 1 drop into both eyes 3 (three) times daily as needed (for dryness).     rosuvastatin (CRESTOR) 20 MG tablet Take 1 tablet (20 mg total) by mouth daily. 90 tablet 3   senna-docusate (SENOKOT-S) 8.6-50 MG tablet Take 2 tablets by mouth 2 (two) times daily.     tamsulosin (FLOMAX) 0.4 MG CAPS capsule Take 0.4 mg by mouth at bedtime.     No current facility-administered medications for this visit.     Objective:  BP 120/62   Pulse 71   Temp 97.9 F (36.6 C)   Ht 5\' 9"  (1.753 m)   Wt 189 lb (85.7 kg)   SpO2 99%   BMI 27.91 kg/m  Gen: NAD, resting comfortably CV: RRR no murmurs rubs or gallops Lungs: CTAB no crackles, wheeze, rhonchi Abdomen: soft/nontender/nondistended/normal bowel sounds. No rebound or guarding. Healing abdominal scar from recent hernia surgery Ext: no edema Skin: warm, dry     Assessment and Plan   # Umbilical hernia surgery on 12/08/2022-patient reports recovering well   # Multiple myeloma-follows with Dr. Marin Olp S: Patient with ongoing infusions through oncology as of February 2024-adjusted  around his surgery.  Also on Revlimid - but on hold around time of hernia surgery -does get some loose bowels/constipation alternating A/P: Overall stable-continue close follow-up with oncology- sees them tomorrow   #CAD-follows with Dr. [name] with history of MI and PCI #hyperlipidemia-LDL goal under 70 #Palpitations-as needed diltiazem 60 mg - not needing S: Medication:Plavix 75 mg, rosuvastatin 20 mg -No chest pain or shortness of breath reported.  Palpitations not having lately.   Lab Results  Component Value Date   CHOL 119 02/02/2022   HDL 34 (A) 02/02/2022   LDLCALC 66 02/02/2022   TRIG 136 02/02/2022  A/P: CAD asymptomatic-continue current medications Lipids with excellent control-not quite due for full repeat but will be due at next visit Palpitations-continue diltiazem as needed    # Diabetes S: Medication: diet controlled in the past but number slipped up last visit. Reports getting steroids recently running this up some as well though not checking sugar regularly Exercise and diet- still active but no regular structured exercise. Feels eating reasonably healthy and good variety- trying to cut down on sweets . Feels could improve on carbs Lab Results  Component Value Date   HGBA1C 7.7 (H) 06/24/2022  A/P: hopefully stable or improved-  update a1c today. Continue current meds for now- at his age consider goal 8 or less but honestly would prefer under 7 but want to minimize meds- could consider low dose metformin if above 8  # GERD with history of Barrett's esophagus S:Medication:  pantoprazole 40 mg B12 levels related to PPI use: Low normal in 2013-has had mild chronic anemia and was advised to remain on B12 long-term- he is taking  Lab Results  Component Value Date   VITAMINB12 275 04/05/2012  A/P: stable- continue current medicines -check b12 level- hoepfully improved   #Vitamin D deficiency S: Medication: 1000 units daily Last vitamin D-none on file A/P:  hopefully stable- update vttamin D today. Continue current meds for now   #Overactive bladder/BPH S: medication: tamsulosin 0.4 mg -In May was restarted on tamsulosin (prior lower blood pressures and stopped)-I do not see oxybutynin noted - had been on in past A/P: stable- continue current medicines   #balance issues/leg weakness- worse since covid in 2021 (hospital for 8 weeks) but has had issues for years. PT was somewhat helpful. -overall stable - continue to monitor    Recommended follow up: Return in about 14 weeks (around 03/31/2023) for followup or sooner if needed.Schedule b4 you leave. Future Appointments  Date Time Provider [place]  12/24/2022  9:45 AM [facility] None  12/24/2022 10:00 AM [facility], Rudell Cobb, MD [facility] None  12/24/2022 10:30 AM [facility] A3 [facility] None  01/15/2023  9:20 AM Marin Olp, MD [facility]  03/24/2023 11:20 AM [name], MD [facility] None   Lab/Order associations:   ICD-10-CM   1. Type 2 diabetes mellitus without complication, without long-term current use of insulin (HCC)  E11.9 HgB A1c    Lipid panel    2. High risk medication use  Z79.899 Vitamin B12    3. Vitamin D deficiency  E55.9 VITAMIN D 25 Hydroxy (Vit-D Deficiency, Fractures)    4. Coronary artery disease involving native coronary artery of native heart without angina pectoris  I25.10     5. Multiple myeloma, remission status unspecified (HCC)  C90.00     6. BPH associated with nocturia  N40.1    R35.1     7. Hyperlipidemia associated with type 2 diabetes mellitus ([place])  E11.69    E78.5       No orders of the defined types were placed in this encounter.   Return precautions advised.  Garret Reddish, MD

## 2022-12-23 NOTE — Patient Instructions (Addendum)
Consider Tdap at pharmacy- check with oncology first  Send [place] dates of 1st shingrix with [facility]  Please stop by lab before you go If you have mychart- we will send your results within 3 business days of [place] receiving them.  If you do not have mychart- we will call you about results within 5 business days of [place] receiving them.  *please also note that you will see labs on mychart as soon as they post. I will later go in and write notes on them- will say "notes from Dr. Yong Channel"   Recommended follow up: Return in about 14 weeks (around 03/31/2023) for followup or sooner if needed.Schedule b4 you leave. Cancel your April visit on the way out

## 2022-12-24 ENCOUNTER — Other Ambulatory Visit: Payer: Self-pay | Admitting: *Deleted

## 2022-12-24 ENCOUNTER — Other Ambulatory Visit (HOSPITAL_BASED_OUTPATIENT_CLINIC_OR_DEPARTMENT_OTHER): Payer: Self-pay

## 2022-12-24 ENCOUNTER — Inpatient Hospital Stay: Payer: Medicare Other

## 2022-12-24 ENCOUNTER — Encounter: Payer: Self-pay | Admitting: Hematology & Oncology

## 2022-12-24 ENCOUNTER — Inpatient Hospital Stay: Payer: Medicare Other | Attending: Hematology & Oncology | Admitting: Hematology & Oncology

## 2022-12-24 VITALS — BP 116/73 | HR 58 | Temp 97.6°F | Resp 20 | Ht 69.0 in | Wt 189.0 lb

## 2022-12-24 VITALS — BP 100/78 | HR 72 | Resp 18

## 2022-12-24 DIAGNOSIS — D630 Anemia in neoplastic disease: Secondary | ICD-10-CM | POA: Diagnosis not present

## 2022-12-24 DIAGNOSIS — C9000 Multiple myeloma not having achieved remission: Secondary | ICD-10-CM | POA: Diagnosis not present

## 2022-12-24 DIAGNOSIS — Z5112 Encounter for antineoplastic immunotherapy: Secondary | ICD-10-CM | POA: Insufficient documentation

## 2022-12-24 LAB — CBC WITH DIFFERENTIAL (CANCER CENTER ONLY)
Abs Immature Granulocytes: 0.01 10*3/uL (ref 0.00–0.07)
Basophils Absolute: 0 10*3/uL (ref 0.0–0.1)
Basophils Relative: 0 %
Eosinophils Absolute: 0.2 10*3/uL (ref 0.0–0.5)
Eosinophils Relative: 3 %
HCT: 35.8 % — ABNORMAL LOW (ref 39.0–52.0)
Hemoglobin: 11.8 g/dL — ABNORMAL LOW (ref 13.0–17.0)
Immature Granulocytes: 0 %
Lymphocytes Relative: 37 %
Lymphs Abs: 2.3 10*3/uL (ref 0.7–4.0)
MCH: 31.2 pg (ref 26.0–34.0)
MCHC: 33 g/dL (ref 30.0–36.0)
MCV: 94.7 fL (ref 80.0–100.0)
Monocytes Absolute: 0.7 10*3/uL (ref 0.1–1.0)
Monocytes Relative: 12 %
Neutro Abs: 2.9 10*3/uL (ref 1.7–7.7)
Neutrophils Relative %: 48 %
Platelet Count: 167 10*3/uL (ref 150–400)
RBC: 3.78 MIL/uL — ABNORMAL LOW (ref 4.22–5.81)
RDW: 14.5 % (ref 11.5–15.5)
WBC Count: 6.1 10*3/uL (ref 4.0–10.5)
nRBC: 0 % (ref 0.0–0.2)

## 2022-12-24 LAB — CMP (CANCER CENTER ONLY)
ALT: 10 U/L (ref 0–44)
AST: 15 U/L (ref 15–41)
Albumin: 4.1 g/dL (ref 3.5–5.0)
Alkaline Phosphatase: 72 U/L (ref 38–126)
Anion gap: 7 (ref 5–15)
BUN: 22 mg/dL (ref 8–23)
CO2: 26 mmol/L (ref 22–32)
Calcium: 9.2 mg/dL (ref 8.9–10.3)
Chloride: 104 mmol/L (ref 98–111)
Creatinine: 1.07 mg/dL (ref 0.61–1.24)
GFR, Estimated: >60 mL/min (ref >60)
Glucose, Bld: 115 mg/dL — ABNORMAL HIGH (ref 70–99)
Potassium: 4.2 mmol/L (ref 3.5–5.1)
Sodium: 137 mmol/L (ref 135–145)
Total Bilirubin: 0.4 mg/dL (ref 0.3–1.2)
Total Protein: 6 g/dL — ABNORMAL LOW (ref 6.5–8.1)

## 2022-12-24 LAB — LACTATE DEHYDROGENASE: LDH: 108 U/L (ref 98–192)

## 2022-12-24 MED ORDER — DIPHENHYDRAMINE HCL 25 MG PO CAPS: 50.0000 mg | ORAL_CAPSULE | Freq: Once | ORAL | Status: DC

## 2022-12-24 MED ORDER — LENALIDOMIDE 15 MG PO CAPS: 15.0000 mg | ORAL_CAPSULE | Freq: Every day | ORAL | 0 refills | Status: DC

## 2022-12-24 MED ORDER — BORTEZOMIB CHEMO SQ INJECTION 3.5 MG (2.5MG/ML)
1.3000 mg/m2 | Freq: Once | INTRAMUSCULAR | Status: AC
  Administered 2022-12-24: 2.75 mg via SUBCUTANEOUS
  Filled 2022-12-24: qty 1.1

## 2022-12-24 MED ORDER — TETANUS-DIPHTH-ACELL PERTUSSIS 5-2.5-18.5 LF-MCG/0.5 IM SUSY
PREFILLED_SYRINGE | INTRAMUSCULAR | 0 refills | Status: DC
  Filled 2022-12-24: qty 0.5, 1d supply, fill #0

## 2022-12-24 MED ORDER — ACETAMINOPHEN 325 MG PO TABS: 650.0000 mg | ORAL_TABLET | Freq: Once | ORAL | Status: DC

## 2022-12-24 MED ORDER — DARATUMUMAB-HYALURONIDASE-FIHJ 1800-30000 MG-UT/15ML ~~LOC~~ SOLN
1800.0000 mg | Freq: Once | SUBCUTANEOUS | Status: AC
  Administered 2022-12-24: 1800 mg via SUBCUTANEOUS
  Filled 2022-12-24: qty 15

## 2022-12-24 NOTE — Patient Instructions (Signed)
[facility]  Discharge Instructions: Thank you for choosing [facility] to provide your oncology and hematology care.   If you have a lab appointment with the Cancer Center, please go directly to the Cancer Center and check in at the registration area.  Wear comfortable clothing and clothing appropriate for easy access to any Portacath or PICC line.   We strive to give you quality time with your provider. You may need to reschedule your appointment if you arrive late (15 or more minutes).  Arriving late affects you and other patients whose appointments are after yours.  Also, if you miss three or more appointments without notifying the office, you may be dismissed from the clinic at the provider's discretion.      For prescription refill requests, have your pharmacy contact our office and allow 72 hours for refills to be completed.    Today you received the following chemotherapy and/or immunotherapy agents Velcade, Darzalex.      To help prevent nausea and vomiting after your treatment, we encourage you to take your nausea medication as directed.  BELOW ARE SYMPTOMS THAT SHOULD BE REPORTED IMMEDIATELY: *FEVER GREATER THAN 100.4 F (38 C) OR HIGHER *CHILLS OR SWEATING *NAUSEA AND VOMITING THAT IS NOT CONTROLLED WITH YOUR NAUSEA MEDICATION *UNUSUAL SHORTNESS OF BREATH *UNUSUAL BRUISING OR BLEEDING *URINARY PROBLEMS (pain or burning when urinating, or frequent urination) *BOWEL PROBLEMS (unusual diarrhea, constipation, pain near the anus) TENDERNESS IN MOUTH AND THROAT WITH OR WITHOUT PRESENCE OF ULCERS (sore throat, sores in mouth, or a toothache) UNUSUAL RASH, SWELLING OR PAIN  UNUSUAL VAGINAL DISCHARGE OR ITCHING   Items with * indicate a potential emergency and should be followed up as soon as possible or go to the Emergency Department if any problems should occur.  Please show the CHEMOTHERAPY ALERT CARD or IMMUNOTHERAPY ALERT CARD  at check-in to the Emergency Department and triage nurse. Should you have questions after your visit or need to cancel or reschedule your appointment, please contact [facility]  [phone] and follow the prompts.  Office hours are 8:00 a.m. to 4:30 p.m. Monday - Friday. Please note that voicemails left after 4:00 p.m. may not be returned until the following business day.  We are closed weekends and major holidays. You have access to a nurse at all times for urgent questions. Please call the main number to the clinic [phone] and follow the prompts.  For any non-urgent questions, you may also contact your provider using MyChart. We now offer e-Visits for anyone 18 and older to request care online for non-urgent symptoms. For details visit [email or web address].   Also download the MyChart app! Go to the app store, search "MyChart", open the app, select [facility], and log in with your MyChart username and password.   

## 2022-12-24 NOTE — Progress Notes (Addendum)
Hematology and Oncology Follow Up Visit  Alexander Armstrong [id number] 10/22/1940 82 y.o. 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 --Start on 10/29/2022     Interim History:  Alexander Armstrong is back for follow-up.  He had surgery for the hernia.  This was a couple weeks ago.  He did well with this.  He is done incredibly well with his treatments.  His last monoclonal spike was down to 0.3 g/dL.  The IgG level was 650 mg/dL.  The Kappa light chain was down to 2.4 mg/dL.  He has had no problems with fever.  He has had no bleeding.  There is been no change in bowel or bladder habits.  He has had no rashes.  There is been no leg swelling.  He has been off the Revlimid.  He has restarted this.  We will see how his blood counts go in the future.    He has had no problems with headache.  Overall, I would say that his performance status is probably ECOG 1.   .    Medications:  Current Outpatient Medications:    acetaminophen (TYLENOL) 500 MG tablet, Take 500 mg by mouth 3 (three) times daily as needed for moderate pain or headache., Disp: , Rfl:    Cholecalciferol (VITAMIN D3) 1000 units CAPS, Take 1,000 Units by mouth at bedtime., Disp: , Rfl:    clopidogrel (PLAVIX) 75 MG tablet, TAKE 1 TABLET(75 MG) BY MOUTH DAILY (Patient taking differently: Take 75 mg by mouth in the morning.), Disp: 90 tablet, Rfl: 2   Cyanocobalamin 1000 MCG TBCR, Take 1,000 mcg by mouth daily., Disp: , Rfl:    dexamethasone (DECADRON) 4 MG tablet, Take 5 tabs (20 mg) weekly the day of daratumumab. Take with breakfast. (Patient taking differently: Take 20 mg by mouth See admin instructions. Take 20 mg with breakfast by mouth one hour prior to Daratumumab infusion on Mondays), Disp: 60  tablet, Rfl: 5   diphenhydrAMINE (BENADRYL ALLERGY) 25 MG tablet, Take 50 mg by mouth See admin instructions. Take 50 mg by mouth one hour prior to Daratumumab infusion on Mondays, Disp: , Rfl:    Docusate Sodium (DSS) 100 MG CAPS, Take by mouth daily., Disp: , Rfl:    famciclovir (FAMVIR) 250 MG tablet, Take 1 tablet (250 mg total) by mouth daily. (Patient taking differently: Take 250 mg by mouth at bedtime.), Disp: 30 tablet, Rfl: 6   fluticasone (FLONASE) 50 MCG/ACT nasal spray, Place 1 spray into both nostrils daily as needed for allergies or rhinitis., Disp: , Rfl:    lenalidomide (REVLIMID) 15 MG capsule, Take 1 capsule (15 mg total) by mouth daily. Fanny Dance # [id number]   Date Obtained 10/28/2022 (Patient taking differently: Take 15 mg by mouth daily. [employer] Auth # [id number]   Date Obtained 10/28/2022 11/19/2022 On 21 days, off 7 days.), Disp: 21 capsule, Rfl: 0   loratadine (CLARITIN) 10 MG tablet, Take 10 mg by mouth in the morning., Disp: , Rfl:    MAGNESIUM PO, Take by mouth daily., Disp: , Rfl:    montelukast (SINGULAIR) 10 MG tablet, Take 1 tablet (10 mg total) by mouth at bedtime. (Patient taking differently: Take 10 mg by mouth See admin instructions. Take 10 mg by mouth in the morning and an additional 10  mg one hour prior to Daratumumab infusion on Mondays), Disp: 30 tablet, Rfl: 6   pantoprazole (PROTONIX) 40 MG tablet, Take 1 tablet (40 mg total) by mouth daily. (Patient taking differently: Take 40 mg by mouth daily before breakfast.), Disp: 90 tablet, Rfl: 3   polyethylene glycol (MIRALAX / GLYCOLAX) 17 g packet, Take 17 g by mouth daily., Disp: , Rfl:    rosuvastatin (CRESTOR) 20 MG tablet, Take 1 tablet (20 mg total) by mouth daily., Disp: 90 tablet, Rfl: 3   tamsulosin (FLOMAX) 0.4 MG CAPS capsule, Take 0.4 mg by mouth at bedtime., Disp: , Rfl:    diltiazem (CARDIZEM) 60 MG tablet, Take 1 tablet (60 mg total) by mouth 4 (four) times daily. As needed for fast heart rate (Patient not  taking: Reported on 12/24/2022), Disp: 60 tablet, Rfl: 1   prochlorperazine (COMPAZINE) 10 MG tablet, Take 1 tablet (10 mg total) by mouth every 6 (six) hours as needed for nausea or vomiting. (Patient not taking: Reported on 12/24/2022), Disp: 30 tablet, Rfl: 1   REFRESH PLUS 0.5 % SOLN, Place 1 drop into both eyes 3 (three) times daily as needed (for dryness). (Patient not taking: Reported on 12/24/2022), Disp: , Rfl:   Allergies:  Allergies  Allergen Reactions   Latex Other (See Comments)    "takes the skin off" (tape)    Past Medical History, Surgical history, Social history, and Family History were reviewed and updated.  Review of Systems: Review of Systems  Constitutional: Negative.   HENT:  Negative.    Eyes: Negative.   Respiratory: Negative.    Cardiovascular: Negative.   Gastrointestinal: Negative.   Endocrine: Negative.   Genitourinary: Negative.    Musculoskeletal: Negative.   Skin: Negative.   Neurological: Negative.   Hematological: Negative.   Psychiatric/Behavioral: Negative.      Physical Exam:  height is 5\' 9"  (1.753 m) and weight is 189 lb (85.7 kg). His oral temperature is 97.6 F (36.4 C). His blood pressure is 116/73 and his pulse is 58 (abnormal). His respiration is 20 and oxygen saturation is 100%.   Wt Readings from Last 3 Encounters:  12/24/22 189 lb (85.7 kg)  12/23/22 189 lb (85.7 kg)  12/08/22 186 lb 15.2 oz (84.8 kg)    Physical Exam Vitals reviewed.  HENT:     Head: Normocephalic and atraumatic.  Eyes:     Pupils: Pupils are equal, round, and reactive to light.  Cardiovascular:     Rate and Rhythm: Normal rate and regular rhythm.     Heart sounds: Normal heart sounds.  Pulmonary:     Effort: Pulmonary effort is normal.     Breath sounds: Normal breath sounds.  Abdominal:     General: Bowel sounds are normal.     Palpations: Abdomen is soft.     Comments: Abdominal exam is somewhat distended.  The umbilical hernia is reducible.  He has  active bowel sounds.  There is no guarding or rebound tenderness.  He has no palpable liver or spleen tip.  There is no fluid wave.    Musculoskeletal:        General: No tenderness or deformity. Normal range of motion.     Cervical back: Normal range of motion.  Lymphadenopathy:     Cervical: No cervical adenopathy.  Skin:    General: Skin is warm and dry.     Findings: No erythema or rash.  Neurological:     Mental Status: He is alert and oriented  to person, place, and time.  Psychiatric:        Behavior: Behavior normal.        Thought Content: Thought content normal.        Judgment: Judgment normal.     Lab Results  Component Value Date   WBC 6.1 12/24/2022   HGB 11.8 (L) 12/24/2022   HCT 35.8 (L) 12/24/2022   MCV 94.7 12/24/2022   PLT 167 12/24/2022     Chemistry      Component Value Date/Time   NA 141 11/26/2022 0958   K 4.9 11/26/2022 0958   CL 108 11/26/2022 0958   CO2 27 11/26/2022 0958   BUN 15 11/26/2022 0958   CREATININE 1.10 11/26/2022 0958   CREATININE 1.33 (H) 01/21/2017 1616      Component Value Date/Time   CALCIUM 9.3 11/26/2022 0958   ALKPHOS 65 11/26/2022 0958   AST 13 (L) 11/26/2022 0958   ALT 12 11/26/2022 0958   BILITOT 0.4 11/26/2022 0958       Impression and Plan: Mr. Obriant is a very nice 82 year old white male.  He has IgG kappa myeloma.  I think what troubles me is his cytogenetics.  He has a very complex karyotype.  Again he probably has about 12 different abnormalities with his chromosomes in the bone marrow.  I suspect this probably will put him at high risk.  I am glad to see that his blood counts are looking pretty good.  Again, it will be interesting to see how his blood counts trend with respect to the Revlimid being restarted.  I suspect that the Revlimid might be held his blood counts in light of the multiple chromosomal abnormalities.  We will go ahead with his 5th cycle of treatment.  I am  happy that he has done so well.      Volanda Napoleon, MD 3/21/202410:33 AM

## 2022-12-25 LAB — KAPPA/LAMBDA LIGHT CHAINS
Kappa free light chain: 10.5 mg/L (ref 3.3–19.4)
Kappa, lambda light chain ratio: 1.81 — ABNORMAL HIGH (ref 0.26–1.65)
Lambda free light chains: 5.8 mg/L (ref 5.7–26.3)

## 2022-12-26 LAB — IGG, IGA, IGM
IgA: 20 mg/dL — ABNORMAL LOW (ref 61–437)
IgG (Immunoglobin G), Serum: 577 mg/dL — ABNORMAL LOW (ref 603–1613)
IgM (Immunoglobulin M), Srm: 6 mg/dL — ABNORMAL LOW (ref 15–143)

## 2022-12-28 LAB — PROTEIN ELECTROPHORESIS, SERUM, WITH REFLEX
A/G Ratio: 1.7 (ref 0.7–1.7)
Albumin ELP: 3.5 g/dL (ref 2.9–4.4)
Alpha-1-Globulin: 0.2 g/dL (ref 0.0–0.4)
Alpha-2-Globulin: 0.6 g/dL (ref 0.4–1.0)
Beta Globulin: 0.7 g/dL (ref 0.7–1.3)
Gamma Globulin: 0.5 g/dL (ref 0.4–1.8)
Globulin, Total: 2.1 g/dL — ABNORMAL LOW (ref 2.2–3.9)
Total Protein ELP: 5.6 g/dL — ABNORMAL LOW (ref 6.0–8.5)

## 2022-12-31 ENCOUNTER — Inpatient Hospital Stay: Payer: Medicare Other

## 2022-12-31 VITALS — BP 100/52 | HR 75 | Temp 97.6°F | Resp 16

## 2022-12-31 DIAGNOSIS — C9000 Multiple myeloma not having achieved remission: Secondary | ICD-10-CM

## 2022-12-31 DIAGNOSIS — Z5112 Encounter for antineoplastic immunotherapy: Secondary | ICD-10-CM | POA: Diagnosis not present

## 2022-12-31 LAB — CMP (CANCER CENTER ONLY)
ALT: 11 U/L (ref 0–44)
AST: 15 U/L (ref 15–41)
Albumin: 4.2 g/dL (ref 3.5–5.0)
Alkaline Phosphatase: 77 U/L (ref 38–126)
Anion gap: 7 (ref 5–15)
BUN: 17 mg/dL (ref 8–23)
CO2: 29 mmol/L (ref 22–32)
Calcium: 9.3 mg/dL (ref 8.9–10.3)
Chloride: 103 mmol/L (ref 98–111)
Creatinine: 1.13 mg/dL (ref 0.61–1.24)
GFR, Estimated: >60 mL/min (ref >60)
Glucose, Bld: 169 mg/dL — ABNORMAL HIGH (ref 70–99)
Potassium: 4.5 mmol/L (ref 3.5–5.1)
Sodium: 139 mmol/L (ref 135–145)
Total Bilirubin: 0.4 mg/dL (ref 0.3–1.2)
Total Protein: 5.8 g/dL — ABNORMAL LOW (ref 6.5–8.1)

## 2022-12-31 LAB — CBC WITH DIFFERENTIAL (CANCER CENTER ONLY)
Abs Immature Granulocytes: 0.03 10*3/uL (ref 0.00–0.07)
Basophils Absolute: 0 10*3/uL (ref 0.0–0.1)
Basophils Relative: 0 %
Eosinophils Absolute: 0.2 10*3/uL (ref 0.0–0.5)
Eosinophils Relative: 3 %
HCT: 35.5 % — ABNORMAL LOW (ref 39.0–52.0)
Hemoglobin: 12 g/dL — ABNORMAL LOW (ref 13.0–17.0)
Immature Granulocytes: 0 %
Lymphocytes Relative: 28 %
Lymphs Abs: 1.9 10*3/uL (ref 0.7–4.0)
MCH: 31.8 pg (ref 26.0–34.0)
MCHC: 33.8 g/dL (ref 30.0–36.0)
MCV: 94.2 fL (ref 80.0–100.0)
Monocytes Absolute: 0.7 10*3/uL (ref 0.1–1.0)
Monocytes Relative: 10 %
Neutro Abs: 4 10*3/uL (ref 1.7–7.7)
Neutrophils Relative %: 59 %
Platelet Count: 155 10*3/uL (ref 150–400)
RBC: 3.77 MIL/uL — ABNORMAL LOW (ref 4.22–5.81)
RDW: 14.6 % (ref 11.5–15.5)
WBC Count: 6.8 10*3/uL (ref 4.0–10.5)
nRBC: 0 % (ref 0.0–0.2)

## 2022-12-31 MED ORDER — DIPHENHYDRAMINE HCL 25 MG PO CAPS
50.0000 mg | ORAL_CAPSULE | Freq: Once | ORAL | Status: DC
  Filled 2022-12-31: qty 2

## 2022-12-31 MED ORDER — DARATUMUMAB-HYALURONIDASE-FIHJ 1800-30000 MG-UT/15ML ~~LOC~~ SOLN
1800.0000 mg | Freq: Once | SUBCUTANEOUS | Status: AC
  Administered 2022-12-31: 1800 mg via SUBCUTANEOUS
  Filled 2022-12-31: qty 15

## 2022-12-31 MED ORDER — ACETAMINOPHEN 325 MG PO TABS
650.0000 mg | ORAL_TABLET | Freq: Once | ORAL | Status: DC
  Filled 2022-12-31: qty 2

## 2022-12-31 MED ORDER — BORTEZOMIB CHEMO SQ INJECTION 3.5 MG (2.5MG/ML)
1.3000 mg/m2 | Freq: Once | INTRAMUSCULAR | Status: AC
  Administered 2022-12-31: 2.75 mg via SUBCUTANEOUS
  Filled 2022-12-31: qty 1.1

## 2023-01-05 ENCOUNTER — Other Ambulatory Visit: Payer: Self-pay

## 2023-01-07 ENCOUNTER — Inpatient Hospital Stay: Payer: Medicare Other

## 2023-01-13 ENCOUNTER — Encounter: Payer: Self-pay | Admitting: *Deleted

## 2023-01-15 ENCOUNTER — Ambulatory Visit: Payer: Medicare Other | Admitting: Family Medicine

## 2023-01-21 ENCOUNTER — Other Ambulatory Visit: Payer: Self-pay

## 2023-01-21 ENCOUNTER — Inpatient Hospital Stay: Payer: Medicare Other

## 2023-01-21 ENCOUNTER — Inpatient Hospital Stay: Payer: Medicare Other | Attending: Hematology & Oncology | Admitting: Medical Oncology

## 2023-01-21 ENCOUNTER — Encounter: Payer: Self-pay | Admitting: Medical Oncology

## 2023-01-21 ENCOUNTER — Inpatient Hospital Stay: Payer: Medicare Other | Admitting: Medical Oncology

## 2023-01-21 VITALS — BP 89/58 | HR 74 | Temp 98.0°F | Resp 18 | Ht 69.0 in | Wt 187.0 lb

## 2023-01-21 DIAGNOSIS — I959 Hypotension, unspecified: Secondary | ICD-10-CM | POA: Insufficient documentation

## 2023-01-21 DIAGNOSIS — D469 Myelodysplastic syndrome, unspecified: Secondary | ICD-10-CM | POA: Insufficient documentation

## 2023-01-21 DIAGNOSIS — Z5112 Encounter for antineoplastic immunotherapy: Secondary | ICD-10-CM | POA: Diagnosis not present

## 2023-01-21 DIAGNOSIS — D630 Anemia in neoplastic disease: Secondary | ICD-10-CM | POA: Insufficient documentation

## 2023-01-21 DIAGNOSIS — C9000 Multiple myeloma not having achieved remission: Secondary | ICD-10-CM

## 2023-01-21 LAB — CBC WITH DIFFERENTIAL (CANCER CENTER ONLY)
Abs Immature Granulocytes: 0.01 10*3/uL (ref 0.00–0.07)
Basophils Absolute: 0.1 10*3/uL (ref 0.0–0.1)
Basophils Relative: 2 %
Eosinophils Absolute: 0.5 10*3/uL (ref 0.0–0.5)
Eosinophils Relative: 11 %
HCT: 35.5 % — ABNORMAL LOW (ref 39.0–52.0)
Hemoglobin: 11.7 g/dL — ABNORMAL LOW (ref 13.0–17.0)
Immature Granulocytes: 0 %
Lymphocytes Relative: 31 %
Lymphs Abs: 1.6 10*3/uL (ref 0.7–4.0)
MCH: 31.5 pg (ref 26.0–34.0)
MCHC: 33 g/dL (ref 30.0–36.0)
MCV: 95.7 fL (ref 80.0–100.0)
Monocytes Absolute: 0.4 10*3/uL (ref 0.1–1.0)
Monocytes Relative: 7 %
Neutro Abs: 2.4 10*3/uL (ref 1.7–7.7)
Neutrophils Relative %: 49 %
Platelet Count: 188 10*3/uL (ref 150–400)
RBC: 3.71 MIL/uL — ABNORMAL LOW (ref 4.22–5.81)
RDW: 14.9 % (ref 11.5–15.5)
WBC Count: 5 10*3/uL (ref 4.0–10.5)
nRBC: 0 % (ref 0.0–0.2)

## 2023-01-21 LAB — CMP (CANCER CENTER ONLY)
ALT: 14 U/L (ref 0–44)
AST: 16 U/L (ref 15–41)
Albumin: 3.9 g/dL (ref 3.5–5.0)
Alkaline Phosphatase: 76 U/L (ref 38–126)
Anion gap: 8 (ref 5–15)
BUN: 19 mg/dL (ref 8–23)
CO2: 27 mmol/L (ref 22–32)
Calcium: 8.9 mg/dL (ref 8.9–10.3)
Chloride: 104 mmol/L (ref 98–111)
Creatinine: 1.2 mg/dL (ref 0.61–1.24)
GFR, Estimated: >60 mL/min (ref >60)
Glucose, Bld: 130 mg/dL — ABNORMAL HIGH (ref 70–99)
Potassium: 4.4 mmol/L (ref 3.5–5.1)
Sodium: 139 mmol/L (ref 135–145)
Total Bilirubin: 0.5 mg/dL (ref 0.3–1.2)
Total Protein: 6 g/dL — ABNORMAL LOW (ref 6.5–8.1)

## 2023-01-21 LAB — LACTATE DEHYDROGENASE: LDH: 110 U/L (ref 98–192)

## 2023-01-21 MED ORDER — DIPHENHYDRAMINE HCL 25 MG PO CAPS: 50.0000 mg | ORAL_CAPSULE | Freq: Once | ORAL | Status: DC

## 2023-01-21 MED ORDER — BORTEZOMIB CHEMO SQ INJECTION 3.5 MG (2.5MG/ML)
1.3000 mg/m2 | Freq: Once | INTRAMUSCULAR | Status: AC
  Administered 2023-01-21: 2.75 mg via SUBCUTANEOUS
  Filled 2023-01-21: qty 1.1

## 2023-01-21 MED ORDER — ACETAMINOPHEN 325 MG PO TABS: 650.0000 mg | ORAL_TABLET | Freq: Once | ORAL | Status: DC

## 2023-01-21 MED ORDER — DARATUMUMAB-HYALURONIDASE-FIHJ 1800-30000 MG-UT/15ML ~~LOC~~ SOLN
1800.0000 mg | Freq: Once | SUBCUTANEOUS | Status: AC
  Administered 2023-01-21: 1800 mg via SUBCUTANEOUS
  Filled 2023-01-21: qty 15

## 2023-01-21 NOTE — Progress Notes (Addendum)
Hematology and Oncology Follow Up Visit  Alexander Armstrong [id number] 08/01/1941 81 y.o. 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 --Start on 10/29/2022     Interim History:  Alexander Armstrong is back for follow-up and consideration for treatment.   He is done incredibly well with his treatments. He reports no side effects or concerns.  His last monoclonal spike was down not observed.  The IgG level was 577 mg/dL.  The Kappa light chain was down to 1.81 mg/dL.  He has had no problems with fever.  He has had no bleeding per say but he did have an accident on his zero turn lawn mower which did result in some bruising.  There is been no change in bowel or bladder habits.  He has had no rashes.  There is been no leg swelling.  Back on the Revlimid and tolerating it well thus far.   Overall, I would say that his performance status is probably ECOG 1.     Wt Readings from Last 3 Encounters:  01/21/23 187 lb (84.8 kg)  12/24/22 189 lb (85.7 kg)  12/23/22 189 lb (85.7 kg)   .    Medications:  Current Outpatient Medications:  .  acetaminophen (TYLENOL) 500 MG tablet, Take 500 mg by mouth 3 (three) times daily as needed for moderate pain or headache., Disp: , Rfl:  .  Cholecalciferol (VITAMIN D3) 1000 units CAPS, Take 1,000 Units by mouth at bedtime., Disp: , Rfl:  .  clopidogrel (PLAVIX) 75 MG tablet, TAKE 1 TABLET(75 MG) BY MOUTH DAILY (Patient taking differently: Take 75 mg by mouth in the morning.), Disp: 90 tablet, Rfl: 2 .  Cyanocobalamin 1000 MCG TBCR, Take 1,000 mcg by mouth daily., Disp: , Rfl:  .  dexamethasone (DECADRON) 4 MG tablet, Take 5 tabs (20 mg) weekly the day of daratumumab. Take with breakfast. (Patient taking differently: Take 20 mg by mouth  See admin instructions. Take 20 mg with breakfast by mouth one hour prior to Daratumumab infusion on Mondays), Disp: 60 tablet, Rfl: 5 .  diphenhydrAMINE (BENADRYL ALLERGY) 25 MG tablet, Take 50 mg by mouth See admin instructions. Take 50 mg by mouth one hour prior to Daratumumab infusion on Mondays, Disp: , Rfl:  .  Docusate Sodium (DSS) 100 MG CAPS, Take by mouth daily., Disp: , Rfl:  .  famciclovir (FAMVIR) 250 MG tablet, Take 1 tablet (250 mg total) by mouth daily. (Patient taking differently: Take 250 mg by mouth at bedtime.), Disp: 30 tablet, Rfl: 6 .  fluticasone (FLONASE) 50 MCG/ACT nasal spray, Place 1 spray into both nostrils daily as needed for allergies or rhinitis., Disp: , Rfl:  .  lenalidomide (REVLIMID) 15 MG capsule, Take 1 capsule (15 mg total) by mouth daily. [employer] Auth # [id number]   Date Obtained:  12/24/2022, Disp: 21 capsule, Rfl: 0 .  loratadine (CLARITIN) 10 MG tablet, Take 10 mg by mouth in the morning., Disp: , Rfl:  .  MAGNESIUM PO, Take by mouth daily., Disp: , Rfl:  .  montelukast (SINGULAIR) 10 MG tablet, Take 1 tablet (10 mg total) by mouth at bedtime. (Patient taking differently: Take 10 mg by mouth See admin instructions. Take 10 mg by mouth in the morning and an additional 10 mg one hour prior to Daratumumab infusion   on Mondays), Disp: 30 tablet, Rfl: 6 .  pantoprazole (PROTONIX) 40 MG tablet, Take 1 tablet (40 mg total) by mouth daily. (Patient taking differently: Take 40 mg by mouth daily before breakfast.), Disp: 90 tablet, Rfl: 3 .  polyethylene glycol (MIRALAX / GLYCOLAX) 17 g packet, Take 17 g by mouth daily., Disp: , Rfl:  .  REFRESH PLUS 0.5 % SOLN, Place 1 drop into both eyes 3 (three) times daily as needed (for dryness)., Disp: , Rfl:  .  rosuvastatin (CRESTOR) 20 MG tablet, Take 1 tablet (20 mg total) by mouth daily., Disp: 90 tablet, Rfl: 3 .  tamsulosin (FLOMAX) 0.4 MG CAPS capsule, Take 0.4 mg by mouth at bedtime., Disp: , Rfl:  .  diltiazem (CARDIZEM) 60  MG tablet, Take 1 tablet (60 mg total) by mouth 4 (four) times daily. As needed for fast heart rate (Patient not taking: Reported on 12/24/2022), Disp: 60 tablet, Rfl: 1 .  prochlorperazine (COMPAZINE) 10 MG tablet, Take 1 tablet (10 mg total) by mouth every 6 (six) hours as needed for nausea or vomiting. (Patient not taking: Reported on 12/24/2022), Disp: 30 tablet, Rfl: 1  Allergies:  Allergies  Allergen Reactions  . Latex Other (See Comments)    "takes the skin off" (tape)    Past Medical History, Surgical history, Social history, and Family History were reviewed and updated.  Review of Systems: Review of Systems  Constitutional: Negative.   HENT:  Negative.    Eyes: Negative.   Respiratory: Negative.    Cardiovascular: Negative.   Gastrointestinal: Negative.   Endocrine: Negative.   Genitourinary: Negative.    Musculoskeletal: Negative.   Skin: Negative.   Neurological: Negative.   Hematological: Negative.   Psychiatric/Behavioral: Negative.      Physical Exam:  height is 5' 9" (1.753 m) and weight is 187 lb (84.8 kg). His oral temperature is 98 F (36.7 C). His blood pressure is 89/58 (abnormal) and his pulse is 74. His respiration is 18 and oxygen saturation is 100%.   Wt Readings from Last 3 Encounters:  01/21/23 187 lb (84.8 kg)  12/24/22 189 lb (85.7 kg)  12/23/22 189 lb (85.7 kg)    Physical Exam Vitals reviewed.  HENT:     Head: Normocephalic and atraumatic.  Eyes:     Pupils: Pupils are equal, round, and reactive to light.  Cardiovascular:     Rate and Rhythm: Normal rate and regular rhythm.     Heart sounds: Normal heart sounds.  Pulmonary:     Effort: Pulmonary effort is normal.     Breath sounds: Normal breath sounds.  Abdominal:     Comments: .    Musculoskeletal:        General: No tenderness or deformity. Normal range of motion.     Cervical back: Normal range of motion.  Lymphadenopathy:     Cervical: No cervical adenopathy.  Skin:     General: Skin is warm and dry.     Findings: No erythema or rash.  Neurological:     Mental Status: He is alert and oriented to person, place, and time.  Psychiatric:        Behavior: Behavior normal.        Thought Content: Thought content normal.        Judgment: Judgment normal.    Lab Results  Component Value Date   WBC 5.0 01/21/2023   HGB 11.7 (L) 01/21/2023   HCT 35.5 (L) 01/21/2023   MCV 95.7 01/21/2023     PLT 188 01/21/2023     Chemistry      Component Value Date/Time   NA 139 12/31/2022 1031   K 4.5 12/31/2022 1031   CL 103 12/31/2022 1031   CO2 29 12/31/2022 1031   BUN 17 12/31/2022 1031   CREATININE 1.13 12/31/2022 1031   CREATININE 1.33 (H) 01/21/2017 1616      Component Value Date/Time   CALCIUM 9.3 12/31/2022 1031   ALKPHOS 77 12/31/2022 1031   AST 15 12/31/2022 1031   ALT 11 12/31/2022 1031   BILITOT 0.4 12/31/2022 1031       Impression and Plan: Mr. Audia is a very nice 81-year-old white male.  He has IgG kappa myeloma.He has a very complex karyotype.  Again he probably has about 12 different abnormalities with his chromosomes in the bone marrow.   Myeloma labs continue to trend in the right direction. Will need to continue to watch his blood work with him restarting his Revlimid. Today his BP is a bit soft- hasn't had anything to eat or drink so I have offered him snacks and drinks here.   We will go ahead with his 6th cycle of treatment.   RTC 3 weeks MD, labs, Cycle 7 + aranesp   Deslyn Cavenaugh M Shirline Kendle, PA-C 4/18/202410:25 AM 

## 2023-01-21 NOTE — Patient Instructions (Signed)
[facility]  Discharge Instructions: Thank you for choosing [facility] to provide your oncology and hematology care.   If you have a lab appointment with the Cancer Center, please go directly to the Cancer Center and check in at the registration area.  Wear comfortable clothing and clothing appropriate for easy access to any Portacath or PICC line.   We strive to give you quality time with your provider. You may need to reschedule your appointment if you arrive late (15 or more minutes).  Arriving late affects you and other patients whose appointments are after yours.  Also, if you miss three or more appointments without notifying the office, you may be dismissed from the clinic at the provider's discretion.      For prescription refill requests, have your pharmacy contact our office and allow 72 hours for refills to be completed.    Today you received the following chemotherapy and/or immunotherapy agents velcade, faspro     To help prevent nausea and vomiting after your treatment, we encourage you to take your nausea medication as directed.  BELOW ARE SYMPTOMS THAT SHOULD BE REPORTED IMMEDIATELY: *FEVER GREATER THAN 100.4 F (38 C) OR HIGHER *CHILLS OR SWEATING *NAUSEA AND VOMITING THAT IS NOT CONTROLLED WITH YOUR NAUSEA MEDICATION *UNUSUAL SHORTNESS OF BREATH *UNUSUAL BRUISING OR BLEEDING *URINARY PROBLEMS (pain or burning when urinating, or frequent urination) *BOWEL PROBLEMS (unusual diarrhea, constipation, pain near the anus) TENDERNESS IN MOUTH AND THROAT WITH OR WITHOUT PRESENCE OF ULCERS (sore throat, sores in mouth, or a toothache) UNUSUAL RASH, SWELLING OR PAIN  UNUSUAL VAGINAL DISCHARGE OR ITCHING   Items with * indicate a potential emergency and should be followed up as soon as possible or go to the Emergency Department if any problems should occur.  Please show the CHEMOTHERAPY ALERT CARD or IMMUNOTHERAPY ALERT CARD at  check-in to the Emergency Department and triage nurse. Should you have questions after your visit or need to cancel or reschedule your appointment, please contact [facility]  [phone] and follow the prompts.  Office hours are 8:00 a.m. to 4:30 p.m. Monday - Friday. Please note that voicemails left after 4:00 p.m. may not be returned until the following business day.  We are closed weekends and major holidays. You have access to a nurse at all times for urgent questions. Please call the main number to the clinic [phone] and follow the prompts.  For any non-urgent questions, you may also contact your provider using MyChart. We now offer e-Visits for anyone 18 and older to request care online for non-urgent symptoms. For details visit [email or web address].   Also download the MyChart app! Go to the app store, search "MyChart", open the app, select , and log in with your MyChart username and password.   

## 2023-01-24 LAB — IGG, IGA, IGM
IgA: 29 mg/dL — ABNORMAL LOW (ref 61–437)
IgG (Immunoglobin G), Serum: 612 mg/dL (ref 603–1613)
IgM (Immunoglobulin M), Srm: 7 mg/dL — ABNORMAL LOW (ref 15–143)

## 2023-01-25 LAB — KAPPA/LAMBDA LIGHT CHAINS
Kappa free light chain: 14.1 mg/L (ref 3.3–19.4)
Kappa, lambda light chain ratio: 1.74 — ABNORMAL HIGH (ref 0.26–1.65)
Lambda free light chains: 8.1 mg/L (ref 5.7–26.3)

## 2023-01-26 LAB — PROTEIN ELECTROPHORESIS, SERUM, WITH REFLEX
A/G Ratio: 1.8 — ABNORMAL HIGH (ref 0.7–1.7)
Albumin ELP: 3.7 g/dL (ref 2.9–4.4)
Alpha-1-Globulin: 0.2 g/dL (ref 0.0–0.4)
Alpha-2-Globulin: 0.6 g/dL (ref 0.4–1.0)
Beta Globulin: 0.7 g/dL (ref 0.7–1.3)
Gamma Globulin: 0.4 g/dL (ref 0.4–1.8)
Globulin, Total: 2.1 g/dL — ABNORMAL LOW (ref 2.2–3.9)
Total Protein ELP: 5.8 g/dL — ABNORMAL LOW (ref 6.0–8.5)

## 2023-01-28 ENCOUNTER — Other Ambulatory Visit: Payer: Self-pay | Admitting: *Deleted

## 2023-01-28 ENCOUNTER — Inpatient Hospital Stay: Payer: Medicare Other

## 2023-01-28 ENCOUNTER — Encounter: Payer: Self-pay | Admitting: Medical Oncology

## 2023-01-28 ENCOUNTER — Inpatient Hospital Stay (HOSPITAL_BASED_OUTPATIENT_CLINIC_OR_DEPARTMENT_OTHER): Payer: Medicare Other | Admitting: Medical Oncology

## 2023-01-28 ENCOUNTER — Other Ambulatory Visit: Payer: Self-pay

## 2023-01-28 DIAGNOSIS — C9000 Multiple myeloma not having achieved remission: Secondary | ICD-10-CM

## 2023-01-28 DIAGNOSIS — Z5112 Encounter for antineoplastic immunotherapy: Secondary | ICD-10-CM | POA: Diagnosis not present

## 2023-01-28 LAB — CMP (CANCER CENTER ONLY)
ALT: 19 U/L (ref 0–44)
AST: 18 U/L (ref 15–41)
Albumin: 3.8 g/dL (ref 3.5–5.0)
Alkaline Phosphatase: 70 U/L (ref 38–126)
Anion gap: 9 (ref 5–15)
BUN: 20 mg/dL (ref 8–23)
CO2: 27 mmol/L (ref 22–32)
Calcium: 8.7 mg/dL — ABNORMAL LOW (ref 8.9–10.3)
Chloride: 103 mmol/L (ref 98–111)
Creatinine: 1.15 mg/dL (ref 0.61–1.24)
GFR, Estimated: >60 mL/min (ref >60)
Glucose, Bld: 134 mg/dL — ABNORMAL HIGH (ref 70–99)
Potassium: 4.3 mmol/L (ref 3.5–5.1)
Sodium: 139 mmol/L (ref 135–145)
Total Bilirubin: 0.4 mg/dL (ref 0.3–1.2)
Total Protein: 5.6 g/dL — ABNORMAL LOW (ref 6.5–8.1)

## 2023-01-28 LAB — CBC WITH DIFFERENTIAL (CANCER CENTER ONLY)
Abs Immature Granulocytes: 0.02 10*3/uL (ref 0.00–0.07)
Basophils Absolute: 0.1 10*3/uL (ref 0.0–0.1)
Basophils Relative: 1 %
Eosinophils Absolute: 0.3 10*3/uL (ref 0.0–0.5)
Eosinophils Relative: 4 %
HCT: 34.7 % — ABNORMAL LOW (ref 39.0–52.0)
Hemoglobin: 11.6 g/dL — ABNORMAL LOW (ref 13.0–17.0)
Immature Granulocytes: 0 %
Lymphocytes Relative: 28 %
Lymphs Abs: 2.3 10*3/uL (ref 0.7–4.0)
MCH: 31.4 pg (ref 26.0–34.0)
MCHC: 33.4 g/dL (ref 30.0–36.0)
MCV: 94 fL (ref 80.0–100.0)
Monocytes Absolute: 1.3 10*3/uL — ABNORMAL HIGH (ref 0.1–1.0)
Monocytes Relative: 16 %
Neutro Abs: 4.1 10*3/uL (ref 1.7–7.7)
Neutrophils Relative %: 51 %
Platelet Count: 139 10*3/uL — ABNORMAL LOW (ref 150–400)
RBC: 3.69 MIL/uL — ABNORMAL LOW (ref 4.22–5.81)
RDW: 14.6 % (ref 11.5–15.5)
WBC Count: 8 10*3/uL (ref 4.0–10.5)
nRBC: 0 % (ref 0.0–0.2)

## 2023-01-28 MED ORDER — BORTEZOMIB CHEMO SQ INJECTION 3.5 MG (2.5MG/ML)
1.3000 mg/m2 | Freq: Once | INTRAMUSCULAR | Status: AC
  Administered 2023-01-28: 2.75 mg via SUBCUTANEOUS
  Filled 2023-01-28: qty 1.1

## 2023-01-28 MED ORDER — PROCHLORPERAZINE MALEATE 10 MG PO TABS: 10.0000 mg | ORAL_TABLET | Freq: Once | ORAL | Status: DC

## 2023-01-28 MED ORDER — LENALIDOMIDE 15 MG PO CAPS: 15.0000 mg | ORAL_CAPSULE | Freq: Every day | ORAL | 0 refills | Status: DC

## 2023-01-28 NOTE — Progress Notes (Addendum)
Hematology and Oncology Follow Up Visit  Alexander Armstrong [id number] 07-20-1941 82 y.o. 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 --Start on 10/29/2022     Interim History:  [name] is back for follow-up and consideration for treatment.   He continues to do really well with treatment- noting no side effects. His last monoclonal spike was down not observed.  The IgG level was 612 mg/dL.  The Kappa light chain was down to 1.74 mg/dL.  No fevers, bleeding episodes, bruising, SOB, peripheral edema.  There is been no change in bowel or bladder habits.  He has had no rashes.  There is been no leg swelling.  The biggest concern at this time is his lower blood pressure. He reports that it is not uncommon for him to go a whole day and forget to drink water. This often leads to lower blood pressure values and very rare dizziness. He is no longer on any BP medications. He reports that he is working on keeping a water bottle near him so he can have a reminder to drink water.   Overall, I would say that his performance status is probably ECOG 1.     Wt Readings from Last 3 Encounters:  01/28/23 188 lb 12.8 oz (85.6 kg)  01/21/23 187 lb (84.8 kg)  12/24/22 189 lb (85.7 kg)   .    Medications:  Current Outpatient Medications:    acetaminophen (TYLENOL) 500 MG tablet, Take 500 mg by mouth 3 (three) times daily as needed for moderate pain or headache., Disp: , Rfl:    Cholecalciferol (VITAMIN D3) 1000 units CAPS, Take 1,000 Units by mouth at bedtime., Disp: , Rfl:    clopidogrel (PLAVIX) 75 MG tablet, TAKE 1 TABLET(75 MG) BY MOUTH DAILY (Patient taking differently: Take 75 mg by mouth in the morning.), Disp: 90 tablet, Rfl: 2   Cyanocobalamin 1000 MCG TBCR,  Take 1,000 mcg by mouth daily., Disp: , Rfl:    dexamethasone (DECADRON) 4 MG tablet, Take 5 tabs (20 mg) weekly the day of daratumumab. Take with breakfast. (Patient taking differently: Take 20 mg by mouth See admin instructions. Take 20 mg with breakfast by mouth one hour prior to Daratumumab infusion on Mondays), Disp: 60 tablet, Rfl: 5   diphenhydrAMINE (BENADRYL ALLERGY) 25 MG tablet, Take 50 mg by mouth See admin instructions. Take 50 mg by mouth one hour prior to Daratumumab infusion on Mondays, Disp: , Rfl:    Docusate Sodium (DSS) 100 MG CAPS, Take by mouth daily., Disp: , Rfl:    famciclovir (FAMVIR) 250 MG tablet, Take 1 tablet (250 mg total) by mouth daily. (Patient taking differently: Take 250 mg by mouth at bedtime.), Disp: 30 tablet, Rfl: 6   fluticasone (FLONASE) 50 MCG/ACT nasal spray, Place 1 spray into both nostrils daily as needed for allergies or rhinitis., Disp: , Rfl:    loratadine (CLARITIN) 10 MG tablet, Take 10 mg by mouth in the morning., Disp: , Rfl:    MAGNESIUM PO, Take by mouth daily., Disp: , Rfl:    montelukast (SINGULAIR) 10 MG tablet, Take 1 tablet (10 mg total) by mouth at bedtime. (Patient taking differently: Take 10 mg by mouth See admin instructions. Take 10 mg by mouth in the morning and an additional 10 mg one hour  prior to Daratumumab infusion on Mondays), Disp: 30 tablet, Rfl: 6   pantoprazole (PROTONIX) 40 MG tablet, Take 1 tablet (40 mg total) by mouth daily. (Patient taking differently: Take 40 mg by mouth daily before breakfast.), Disp: 90 tablet, Rfl: 3   polyethylene glycol (MIRALAX / GLYCOLAX) 17 g packet, Take 17 g by mouth daily., Disp: , Rfl:    REFRESH PLUS 0.5 % SOLN, Place 1 drop into both eyes 3 (three) times daily as needed (for dryness)., Disp: , Rfl:    rosuvastatin (CRESTOR) 20 MG tablet, Take 1 tablet (20 mg total) by mouth daily., Disp: 90 tablet, Rfl: 3   tamsulosin (FLOMAX) 0.4 MG CAPS capsule, Take 0.4 mg by mouth at bedtime., Disp: ,  Rfl:    lenalidomide (REVLIMID) 15 MG capsule, Take 1 capsule (15 mg total) by mouth daily. [name] #[id number]    Date Obtained:  01/28/2023, Disp: 21 capsule, Rfl: 0 No current facility-administered medications for this visit.  Facility-Administered Medications Ordered in Other Visits:    prochlorperazine (COMPAZINE) tablet 10 mg, 10 mg, Oral, Once, Ennever, Rose Phi, MD  Allergies:  Allergies  Allergen Reactions   Latex Other (See Comments)    "takes the skin off" (tape)    Past Medical History, Surgical history, Social history, and Family History were reviewed and updated.  Review of Systems: Review of Systems  Constitutional: Negative.   HENT:  Negative.    Eyes: Negative.   Respiratory: Negative.    Cardiovascular: Negative.   Gastrointestinal: Negative.   Endocrine: Negative.   Genitourinary: Negative.    Musculoskeletal: Negative.   Skin: Negative.   Neurological: Negative.   Hematological: Negative.   Psychiatric/Behavioral: Negative.      Physical Exam:  height is  (1.753 m) and weight is 188 lb 12.8 oz (85.6 kg). His oral temperature is 97.9 F (36.6 C). His blood pressure is 84/69 (abnormal) and his pulse is 71. His respiration is 18 and oxygen saturation is 98%.   Wt Readings from Last 3 Encounters:  01/28/23 188 lb 12.8 oz (85.6 kg)  01/21/23 187 lb (84.8 kg)  12/24/22 189 lb (85.7 kg)    Physical Exam Vitals reviewed.  HENT:     Head: Normocephalic and atraumatic.  Eyes:     Pupils: Pupils are equal, round, and reactive to light.  Cardiovascular:     Rate and Rhythm: Normal rate and regular rhythm.     Heart sounds: Normal heart sounds.  Pulmonary:     Effort: Pulmonary effort is normal.     Breath sounds: Normal breath sounds.  Abdominal:     Comments: [name]    Musculoskeletal:        General: No tenderness or deformity. Normal range of motion.     Cervical back: Normal range of motion.  Lymphadenopathy:     Cervical: No cervical  adenopathy.  Skin:    General: Skin is warm and dry.     Findings: No erythema or rash.  Neurological:     Mental Status: He is alert and oriented to person, place, and time.  Psychiatric:        Behavior: Behavior normal.        Thought Content: Thought content normal.        Judgment: Judgment normal.     Lab Results  Component Value Date   WBC 8.0 01/28/2023   HGB 11.6 (L) 01/28/2023   HCT 34.7 (L) 01/28/2023   MCV 94.0 01/28/2023   PLT  139 (L) 01/28/2023     Chemistry      Component Value Date/Time   NA 139 01/28/2023 0923   K 4.3 01/28/2023 0923   CL 103 01/28/2023 0923   CO2 27 01/28/2023 0923   BUN 20 01/28/2023 0923   CREATININE 1.15 01/28/2023 0923   CREATININE 1.33 (H) 01/21/2017 1616      Component Value Date/Time   CALCIUM 8.7 (L) 01/28/2023 0923   ALKPHOS 70 01/28/2023 0923   AST 18 01/28/2023 0923   ALT 19 01/28/2023 0923   BILITOT 0.4 01/28/2023 0923       Impression and Plan: Mr. Tay is a very nice 82 year old white male.  He has IgG kappa myeloma.He has a very complex karyotype.  Again he probably has about 12 different abnormalities with his chromosomes in the bone marrow.   He is doing well other than the hypotension. We had a long discussion about his blood pressure and my recommendations for him to increase his salt and water intake. He is agreeable. He is getting Aranesp today which may help.   We reviewed his labs from today (CBC/CMP) which are acceptable for treatment. We will go ahead with his 7th cycle of treatment.    Disposition: RTC 3 weeks MD, labs(CBC w/, CMP, light chains, IgG, LDH, Serum protein electrophoresis), Cycle 8 + aranesp -[place]  Rushie Chestnut, PA-C 4/25/202410:30 AM

## 2023-01-28 NOTE — Patient Instructions (Signed)
[facility]  Discharge Instructions: Thank you for choosing [facility] to provide your oncology and hematology care.   If you have a lab appointment with the Cancer Center, please go directly to the Cancer Center and check in at the registration area.  Wear comfortable clothing and clothing appropriate for easy access to any Portacath or PICC line.   We strive to give you quality time with your provider. You may need to reschedule your appointment if you arrive late (15 or more minutes).  Arriving late affects you and other patients whose appointments are after yours.  Also, if you miss three or more appointments without notifying the office, you may be dismissed from the clinic at the provider's discretion.      For prescription refill requests, have your pharmacy contact our office and allow 72 hours for refills to be completed.    Today you received the following chemotherapy and/or immunotherapy agents Velcade.      To help prevent nausea and vomiting after your treatment, we encourage you to take your nausea medication as directed.  BELOW ARE SYMPTOMS THAT SHOULD BE REPORTED IMMEDIATELY: *FEVER GREATER THAN 100.4 F (38 C) OR HIGHER *CHILLS OR SWEATING *NAUSEA AND VOMITING THAT IS NOT CONTROLLED WITH YOUR NAUSEA MEDICATION *UNUSUAL SHORTNESS OF BREATH *UNUSUAL BRUISING OR BLEEDING *URINARY PROBLEMS (pain or burning when urinating, or frequent urination) *BOWEL PROBLEMS (unusual diarrhea, constipation, pain near the anus) TENDERNESS IN MOUTH AND THROAT WITH OR WITHOUT PRESENCE OF ULCERS (sore throat, sores in mouth, or a toothache) UNUSUAL RASH, SWELLING OR PAIN  UNUSUAL VAGINAL DISCHARGE OR ITCHING   Items with * indicate a potential emergency and should be followed up as soon as possible or go to the Emergency Department if any problems should occur.  Please show the CHEMOTHERAPY ALERT CARD or IMMUNOTHERAPY ALERT CARD at check-in  to the Emergency Department and triage nurse. Should you have questions after your visit or need to cancel or reschedule your appointment, please contact [facility]  [phone] and follow the prompts.  Office hours are 8:00 a.m. to 4:30 p.m. Monday - Friday. Please note that voicemails left after 4:00 p.m. may not be returned until the following business day.  We are closed weekends and major holidays. You have access to a nurse at all times for urgent questions. Please call the main number to the clinic [phone] and follow the prompts.  For any non-urgent questions, you may also contact your provider using MyChart. We now offer e-Visits for anyone 18 and older to request care online for non-urgent symptoms. For details visit [email or web address].   Also download the MyChart app! Go to the app store, search "MyChart", open the app, select [facility], and log in with your MyChart username and password.   

## 2023-01-28 NOTE — Addendum Note (Signed)
Addended by: Clent Jacks on: 01/28/2023 10:30 AM   Modules accepted: Orders

## 2023-02-03 ENCOUNTER — Telehealth: Payer: Self-pay | Admitting: *Deleted

## 2023-02-03 NOTE — Telephone Encounter (Signed)
I called patient and informed him that his paperwork for his cancer policy is at the front desk in a [employer]. I printed Dr. Gustavo Lah office note stating the cancer diagnosis, FISH and cytogenetics results. He will need to fax these documents to the office where the cancer policy is located. Instructed him to call the office if he had any questions.

## 2023-02-17 ENCOUNTER — Other Ambulatory Visit: Payer: Self-pay

## 2023-02-17 DIAGNOSIS — C9000 Multiple myeloma not having achieved remission: Secondary | ICD-10-CM

## 2023-02-18 ENCOUNTER — Inpatient Hospital Stay: Payer: Medicare Other | Attending: Hematology & Oncology

## 2023-02-18 ENCOUNTER — Encounter: Payer: Self-pay | Admitting: Hematology & Oncology

## 2023-02-18 ENCOUNTER — Inpatient Hospital Stay: Payer: Medicare Other

## 2023-02-18 ENCOUNTER — Other Ambulatory Visit: Payer: Self-pay

## 2023-02-18 ENCOUNTER — Inpatient Hospital Stay (HOSPITAL_BASED_OUTPATIENT_CLINIC_OR_DEPARTMENT_OTHER): Payer: Medicare Other | Admitting: Hematology & Oncology

## 2023-02-18 VITALS — BP 113/63 | HR 80 | Temp 98.0°F | Resp 19 | Ht 69.0 in | Wt 190.0 lb

## 2023-02-18 DIAGNOSIS — D630 Anemia in neoplastic disease: Secondary | ICD-10-CM | POA: Insufficient documentation

## 2023-02-18 DIAGNOSIS — Z5112 Encounter for antineoplastic immunotherapy: Secondary | ICD-10-CM | POA: Insufficient documentation

## 2023-02-18 DIAGNOSIS — C9000 Multiple myeloma not having achieved remission: Secondary | ICD-10-CM

## 2023-02-18 LAB — CBC WITH DIFFERENTIAL (CANCER CENTER ONLY)
Abs Immature Granulocytes: 0.01 10*3/uL (ref 0.00–0.07)
Basophils Absolute: 0.1 10*3/uL (ref 0.0–0.1)
Basophils Relative: 3 %
Eosinophils Absolute: 0.3 10*3/uL (ref 0.0–0.5)
Eosinophils Relative: 6 %
HCT: 34.2 % — ABNORMAL LOW (ref 39.0–52.0)
Hemoglobin: 11.2 g/dL — ABNORMAL LOW (ref 13.0–17.0)
Immature Granulocytes: 0 %
Lymphocytes Relative: 43 %
Lymphs Abs: 2 10*3/uL (ref 0.7–4.0)
MCH: 31.4 pg (ref 26.0–34.0)
MCHC: 32.7 g/dL (ref 30.0–36.0)
MCV: 95.8 fL (ref 80.0–100.0)
Monocytes Absolute: 0.4 10*3/uL (ref 0.1–1.0)
Monocytes Relative: 9 %
Neutro Abs: 1.9 10*3/uL (ref 1.7–7.7)
Neutrophils Relative %: 39 %
Platelet Count: 184 10*3/uL (ref 150–400)
RBC: 3.57 MIL/uL — ABNORMAL LOW (ref 4.22–5.81)
RDW: 15.1 % (ref 11.5–15.5)
WBC Count: 4.8 10*3/uL (ref 4.0–10.5)
nRBC: 0 % (ref 0.0–0.2)

## 2023-02-18 LAB — CMP (CANCER CENTER ONLY)
ALT: 14 U/L (ref 0–44)
AST: 15 U/L (ref 15–41)
Albumin: 4.3 g/dL (ref 3.5–5.0)
Alkaline Phosphatase: 77 U/L (ref 38–126)
Anion gap: 8 (ref 5–15)
BUN: 19 mg/dL (ref 8–23)
CO2: 28 mmol/L (ref 22–32)
Calcium: 9.5 mg/dL (ref 8.9–10.3)
Chloride: 104 mmol/L (ref 98–111)
Creatinine: 1.08 mg/dL (ref 0.61–1.24)
GFR, Estimated: >60 mL/min (ref >60)
Glucose, Bld: 127 mg/dL — ABNORMAL HIGH (ref 70–99)
Potassium: 4.2 mmol/L (ref 3.5–5.1)
Sodium: 140 mmol/L (ref 135–145)
Total Bilirubin: 0.4 mg/dL (ref 0.3–1.2)
Total Protein: 6.2 g/dL — ABNORMAL LOW (ref 6.5–8.1)

## 2023-02-18 LAB — LACTATE DEHYDROGENASE: LDH: 117 U/L (ref 98–192)

## 2023-02-18 MED ORDER — ACETAMINOPHEN 325 MG PO TABS
650.0000 mg | ORAL_TABLET | Freq: Once | ORAL | Status: AC
  Administered 2023-02-18: 650 mg via ORAL
  Filled 2023-02-18: qty 2

## 2023-02-18 MED ORDER — DARATUMUMAB-HYALURONIDASE-FIHJ 1800-30000 MG-UT/15ML ~~LOC~~ SOLN
1800.0000 mg | Freq: Once | SUBCUTANEOUS | Status: AC
  Administered 2023-02-18: 1800 mg via SUBCUTANEOUS
  Filled 2023-02-18: qty 15

## 2023-02-18 MED ORDER — DIPHENHYDRAMINE HCL 25 MG PO CAPS
50.0000 mg | ORAL_CAPSULE | Freq: Once | ORAL | Status: AC
  Administered 2023-02-18: 50 mg via ORAL
  Filled 2023-02-18: qty 2

## 2023-02-18 MED ORDER — BORTEZOMIB CHEMO SQ INJECTION 3.5 MG (2.5MG/ML)
1.3000 mg/m2 | Freq: Once | INTRAMUSCULAR | Status: AC
  Administered 2023-02-18: 2.75 mg via SUBCUTANEOUS
  Filled 2023-02-18: qty 1.1

## 2023-02-18 NOTE — Patient Instructions (Signed)
[facility]  Discharge Instructions: Thank you for choosing [facility] to provide your oncology and hematology care.   If you have a lab appointment with the Cancer Center, please go directly to the Cancer Center and check in at the registration area.  Wear comfortable clothing and clothing appropriate for easy access to any Portacath or PICC line.   We strive to give you quality time with your provider. You may need to reschedule your appointment if you arrive late (15 or more minutes).  Arriving late affects you and other patients whose appointments are after yours.  Also, if you miss three or more appointments without notifying the office, you may be dismissed from the clinic at the provider's discretion.      For prescription refill requests, have your pharmacy contact our office and allow 72 hours for refills to be completed.    Today you received the following chemotherapy and/or immunotherapy agents Darzalex, Velcade.   To help prevent nausea and vomiting after your treatment, we encourage you to take your nausea medication as directed.  BELOW ARE SYMPTOMS THAT SHOULD BE REPORTED IMMEDIATELY: *FEVER GREATER THAN 100.4 F (38 C) OR HIGHER *CHILLS OR SWEATING *NAUSEA AND VOMITING THAT IS NOT CONTROLLED WITH YOUR NAUSEA MEDICATION *UNUSUAL SHORTNESS OF BREATH *UNUSUAL BRUISING OR BLEEDING *URINARY PROBLEMS (pain or burning when urinating, or frequent urination) *BOWEL PROBLEMS (unusual diarrhea, constipation, pain near the anus) TENDERNESS IN MOUTH AND THROAT WITH OR WITHOUT PRESENCE OF ULCERS (sore throat, sores in mouth, or a toothache) UNUSUAL RASH, SWELLING OR PAIN  UNUSUAL VAGINAL DISCHARGE OR ITCHING   Items with * indicate a potential emergency and should be followed up as soon as possible or go to the Emergency Department if any problems should occur.  Please show the CHEMOTHERAPY ALERT CARD or IMMUNOTHERAPY ALERT CARD at  check-in to the Emergency Department and triage nurse. Should you have questions after your visit or need to cancel or reschedule your appointment, please contact [facility]  [phone] and follow the prompts.  Office hours are 8:00 a.m. to 4:30 p.m. Monday - Friday. Please note that voicemails left after 4:00 p.m. may not be returned until the following business day.  We are closed weekends and major holidays. You have access to a nurse at all times for urgent questions. Please call the main number to the clinic [phone] and follow the prompts.  For any non-urgent questions, you may also contact your provider using MyChart. We now offer e-Visits for anyone 18 and older to request care online for non-urgent symptoms. For details visit [email or web address].   Also download the MyChart app! Go to the app store, search "MyChart", open the app, select [facility], and log in with your MyChart username and password.   

## 2023-02-18 NOTE — Progress Notes (Signed)
Hematology and Oncology Follow Up Visit  Alexander Armstrong [id number] 03-27-1941 82 y.o. 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 --Start on 10/29/2022     Interim History:  Alexander Armstrong is back for follow-up.  Unfortunately, the problem now is that his wife was diagnosed with a sarcoma.  This appeared to be behind her the right knee.  She had surgery for this.  She had this I think couple weeks ago.  I just feel bad that she has this.  Hopefully this is just localized.  If so, we will be more than happy to help her out.  Otherwise, he is doing okay.  He has had no problems with cough or shortness of breath.  He has had hernia surgery.  He had this back in February.  He tolerated this.  He is doing quite well.  His monoclonal studies have all been doing quite well.  When we last saw him back in April, there was no monoclonal spike in his blood.  His IgG level was 612 mg/dL.  The Kappa light chain was 1.4 mg/dL.  He has had no change in bowel or bladder habits.  We now have him off Revlimid.  He was having a lot of problems Revlimid with toxicity.  Mostly, he is having a lot of fatigue and loss of stamina.  Since being off Revlimid, he feels a lot better.  He has had no bleeding.  There has been no rashes.  Overall, his performance status is ECOG 1.    Medications:  Current Outpatient Medications:    acetaminophen (TYLENOL) 500 MG tablet, Take 500 mg by mouth 3 (three) times daily as needed for moderate pain or headache., Disp: , Rfl:    Cholecalciferol (VITAMIN D3) 1000 units CAPS, Take 1,000 Units by mouth at bedtime., Disp: , Rfl:    clopidogrel (PLAVIX) 75 MG tablet, TAKE 1 TABLET(75 MG) BY MOUTH DAILY (Patient taking differently: Take 75 mg by  mouth in the morning.), Disp: 90 tablet, Rfl: 2   Cyanocobalamin 1000 MCG TBCR, Take 1,000 mcg by mouth daily., Disp: , Rfl:    dexamethasone (DECADRON) 4 MG tablet, Take 5 tabs (20 mg) weekly the day of daratumumab. Take with breakfast. (Patient taking differently: Take 20 mg by mouth See admin instructions. Take 20 mg with breakfast by mouth one hour prior to Daratumumab infusion on Mondays), Disp: 60 tablet, Rfl: 5   diphenhydrAMINE (BENADRYL ALLERGY) 25 MG tablet, Take 50 mg by mouth See admin instructions. Take 50 mg by mouth one hour prior to Daratumumab infusion on Mondays, Disp: , Rfl:    Docusate Sodium (DSS) 100 MG CAPS, Take by mouth daily., Disp: , Rfl:    famciclovir (FAMVIR) 250 MG tablet, Take 1 tablet (250 mg total) by mouth daily. (Patient taking differently: Take 250 mg by mouth at bedtime.), Disp: 30 tablet, Rfl: 6   fluticasone (FLONASE) 50 MCG/ACT nasal spray, Place 1 spray into both nostrils daily as needed for allergies or rhinitis., Disp: , Rfl:    lenalidomide (REVLIMID) 15 MG capsule, Take 1 capsule (15 mg total) by mouth daily. Alexander Armstrong #[id number]    Date Obtained:  01/28/2023, Disp: 21 capsule, Rfl: 0   loratadine (CLARITIN) 10 MG tablet, Take 10 mg by mouth in the morning., Disp: ,  Rfl:    MAGNESIUM PO, Take by mouth daily., Disp: , Rfl:    montelukast (SINGULAIR) 10 MG tablet, Take 1 tablet (10 mg total) by mouth at bedtime. (Patient taking differently: Take 10 mg by mouth See admin instructions. Take 10 mg by mouth in the morning and an additional 10 mg one hour prior to Daratumumab infusion on Mondays), Disp: 30 tablet, Rfl: 6   pantoprazole (PROTONIX) 40 MG tablet, Take 1 tablet (40 mg total) by mouth daily. (Patient taking differently: Take 40 mg by mouth daily before breakfast.), Disp: 90 tablet, Rfl: 3   polyethylene glycol (MIRALAX / GLYCOLAX) 17 g packet, Take 17 g by mouth daily., Disp: , Rfl:    REFRESH PLUS 0.5 % SOLN, Place 1 drop into both eyes 3 (three)  times daily as needed (for dryness)., Disp: , Rfl:    rosuvastatin (CRESTOR) 20 MG tablet, Take 1 tablet (20 mg total) by mouth daily., Disp: 90 tablet, Rfl: 3   tamsulosin (FLOMAX) 0.4 MG CAPS capsule, Take 0.4 mg by mouth at bedtime., Disp: , Rfl:   Allergies:  Allergies  Allergen Reactions   Latex Other (See Comments)    "takes the skin off" (tape)    Past Medical History, Surgical history, Social history, and Family History were reviewed and updated.  Review of Systems: Review of Systems  Constitutional: Negative.   HENT:  Negative.    Eyes: Negative.   Respiratory: Negative.    Cardiovascular: Negative.   Gastrointestinal: Negative.   Endocrine: Negative.   Genitourinary: Negative.    Musculoskeletal: Negative.   Skin: Negative.   Neurological: Negative.   Hematological: Negative.   Psychiatric/Behavioral: Negative.      Physical Exam:  height is 5\' 9"  (1.753 m) and weight is 190 lb (86.2 kg). His oral temperature is 98 F (36.7 C). His blood pressure is 113/63 and his pulse is 80. His respiration is 19 and oxygen saturation is 99%.   Wt Readings from Last 3 Encounters:  02/18/23 190 lb (86.2 kg)  01/28/23 188 lb 12.8 oz (85.6 kg)  01/21/23 187 lb (84.8 kg)    Physical Exam Vitals reviewed.  HENT:     Head: Normocephalic and atraumatic.  Eyes:     Pupils: Pupils are equal, round, and reactive to light.  Cardiovascular:     Rate and Rhythm: Normal rate and regular rhythm.     Heart sounds: Normal heart sounds.  Pulmonary:     Effort: Pulmonary effort is normal.     Breath sounds: Normal breath sounds.  Abdominal:     General: Bowel sounds are normal.     Palpations: Abdomen is soft.     Comments: Abdominal exam is somewhat distended.  The umbilical hernia is reducible.  He has active bowel sounds.  There is no guarding or rebound tenderness.  He has no palpable liver or spleen tip.  There is no fluid wave.    Musculoskeletal:        General: No tenderness  or deformity. Normal range of motion.     Cervical back: Normal range of motion.  Lymphadenopathy:     Cervical: No cervical adenopathy.  Skin:    General: Skin is warm and dry.     Findings: No erythema or rash.  Neurological:     Mental Status: He is alert and oriented to person, place, and time.  Psychiatric:        Behavior: Behavior normal.        Thought  Content: Thought content normal.        Judgment: Judgment normal.    Lab Results  Component Value Date   WBC 4.8 02/18/2023   HGB 11.2 (L) 02/18/2023   HCT 34.2 (L) 02/18/2023   MCV 95.8 02/18/2023   PLT 184 02/18/2023     Chemistry      Component Value Date/Time   NA 140 02/18/2023 1021   K 4.2 02/18/2023 1021   CL 104 02/18/2023 1021   CO2 28 02/18/2023 1021   BUN 19 02/18/2023 1021   CREATININE 1.08 02/18/2023 1021   CREATININE 1.33 (H) 01/21/2017 1616      Component Value Date/Time   CALCIUM 9.5 02/18/2023 1021   ALKPHOS 77 02/18/2023 1021   AST 15 02/18/2023 1021   ALT 14 02/18/2023 1021   BILITOT 0.4 02/18/2023 1021       Impression and Plan: Mr. Guilfoyle is a very nice 82 year old white male.  He has IgG kappa myeloma.  I think what troubles me is his cytogenetics.  He has a very complex karyotype.  Again he probably has about 12 different abnormalities with his chromosomes in the bone marrow.  I suspect this probably will put him at high risk.  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  At some point, are probably going to have to do a bone marrow test on him.  It was nice to see how the bone marrow looks.  More importantly, it be important to see how the chromosomes look.  I will probably think about doing a bone marrow biopsy on him in the summer.  For right now, we will just continue him on his current protocol.  I think he is doing well with this.  Thankfully, he does not need any Aranesp.  We will plan to see him back in  another month.  If we need to see his wife, I will be more than happy to help her out.    Josph Macho, MD 5/16/202411:36 AM

## 2023-02-19 LAB — IGG, IGA, IGM
IgA: 34 mg/dL — ABNORMAL LOW (ref 61–437)
IgG (Immunoglobin G), Serum: 595 mg/dL — ABNORMAL LOW (ref 603–1613)
IgM (Immunoglobulin M), Srm: 6 mg/dL — ABNORMAL LOW (ref 15–143)

## 2023-02-19 LAB — KAPPA/LAMBDA LIGHT CHAINS
Kappa free light chain: 14.5 mg/L (ref 3.3–19.4)
Kappa, lambda light chain ratio: 1.56 (ref 0.26–1.65)
Lambda free light chains: 9.3 mg/L (ref 5.7–26.3)

## 2023-02-22 ENCOUNTER — Encounter: Payer: Self-pay | Admitting: Hematology & Oncology

## 2023-02-23 LAB — PROTEIN ELECTROPHORESIS, SERUM
A/G Ratio: 1.7 (ref 0.7–1.7)
Albumin ELP: 3.7 g/dL (ref 2.9–4.4)
Alpha-1-Globulin: 0.2 g/dL (ref 0.0–0.4)
Alpha-2-Globulin: 0.7 g/dL (ref 0.4–1.0)
Beta Globulin: 0.8 g/dL (ref 0.7–1.3)
Gamma Globulin: 0.5 g/dL (ref 0.4–1.8)
Globulin, Total: 2.2 g/dL (ref 2.2–3.9)
Total Protein ELP: 5.9 g/dL — ABNORMAL LOW (ref 6.0–8.5)

## 2023-02-25 ENCOUNTER — Inpatient Hospital Stay: Payer: Medicare Other | Admitting: Hematology & Oncology

## 2023-02-25 ENCOUNTER — Inpatient Hospital Stay: Payer: Medicare Other

## 2023-02-25 ENCOUNTER — Other Ambulatory Visit: Payer: Self-pay | Admitting: Hematology & Oncology

## 2023-02-25 VITALS — BP 127/57 | HR 56 | Temp 97.7°F | Resp 18

## 2023-02-25 DIAGNOSIS — C9000 Multiple myeloma not having achieved remission: Secondary | ICD-10-CM

## 2023-02-25 DIAGNOSIS — Z5112 Encounter for antineoplastic immunotherapy: Secondary | ICD-10-CM | POA: Diagnosis not present

## 2023-02-25 LAB — CBC WITH DIFFERENTIAL (CANCER CENTER ONLY)
Abs Immature Granulocytes: 0.01 10*3/uL (ref 0.00–0.07)
Basophils Absolute: 0.1 10*3/uL (ref 0.0–0.1)
Basophils Relative: 2 %
Eosinophils Absolute: 0.3 10*3/uL (ref 0.0–0.5)
Eosinophils Relative: 5 %
HCT: 33.9 % — ABNORMAL LOW (ref 39.0–52.0)
Hemoglobin: 11.1 g/dL — ABNORMAL LOW (ref 13.0–17.0)
Immature Granulocytes: 0 %
Lymphocytes Relative: 27 %
Lymphs Abs: 1.7 10*3/uL (ref 0.7–4.0)
MCH: 31.6 pg (ref 26.0–34.0)
MCHC: 32.7 g/dL (ref 30.0–36.0)
MCV: 96.6 fL (ref 80.0–100.0)
Monocytes Absolute: 1 10*3/uL (ref 0.1–1.0)
Monocytes Relative: 15 %
Neutro Abs: 3.4 10*3/uL (ref 1.7–7.7)
Neutrophils Relative %: 51 %
Platelet Count: 114 10*3/uL — ABNORMAL LOW (ref 150–400)
RBC: 3.51 MIL/uL — ABNORMAL LOW (ref 4.22–5.81)
RDW: 15.1 % (ref 11.5–15.5)
WBC Count: 6.6 10*3/uL (ref 4.0–10.5)
nRBC: 0 % (ref 0.0–0.2)

## 2023-02-25 LAB — CMP (CANCER CENTER ONLY)
ALT: 14 U/L (ref 0–44)
AST: 17 U/L (ref 15–41)
Albumin: 4 g/dL (ref 3.5–5.0)
Alkaline Phosphatase: 67 U/L (ref 38–126)
Anion gap: 5 (ref 5–15)
BUN: 25 mg/dL — ABNORMAL HIGH (ref 8–23)
CO2: 27 mmol/L (ref 22–32)
Calcium: 9 mg/dL (ref 8.9–10.3)
Chloride: 106 mmol/L (ref 98–111)
Creatinine: 1.16 mg/dL (ref 0.61–1.24)
GFR, Estimated: >60 mL/min (ref >60)
Glucose, Bld: 197 mg/dL — ABNORMAL HIGH (ref 70–99)
Potassium: 4.2 mmol/L (ref 3.5–5.1)
Sodium: 138 mmol/L (ref 135–145)
Total Bilirubin: 0.5 mg/dL (ref 0.3–1.2)
Total Protein: 5.7 g/dL — ABNORMAL LOW (ref 6.5–8.1)

## 2023-02-25 MED ORDER — PROCHLORPERAZINE MALEATE 10 MG PO TABS: 10.0000 mg | ORAL_TABLET | Freq: Once | ORAL | Status: DC

## 2023-02-25 MED ORDER — BORTEZOMIB CHEMO SQ INJECTION 3.5 MG (2.5MG/ML)
1.3000 mg/m2 | Freq: Once | INTRAMUSCULAR | Status: AC
  Administered 2023-02-25: 2.75 mg via SUBCUTANEOUS
  Filled 2023-02-25: qty 1.1

## 2023-03-05 ENCOUNTER — Other Ambulatory Visit: Payer: Self-pay

## 2023-03-11 ENCOUNTER — Encounter: Payer: Self-pay | Admitting: Physician Assistant

## 2023-03-11 ENCOUNTER — Ambulatory Visit (INDEPENDENT_AMBULATORY_CARE_PROVIDER_SITE_OTHER): Payer: Medicare Other | Admitting: Physician Assistant

## 2023-03-11 VITALS — BP 98/62 | HR 62 | Temp 98.2°F | Ht 69.0 in | Wt 188.8 lb

## 2023-03-11 DIAGNOSIS — Z79899 Other long term (current) drug therapy: Secondary | ICD-10-CM | POA: Diagnosis not present

## 2023-03-11 DIAGNOSIS — R21 Rash and other nonspecific skin eruption: Secondary | ICD-10-CM | POA: Diagnosis not present

## 2023-03-11 DIAGNOSIS — S0006XA Insect bite (nonvenomous) of scalp, initial encounter: Secondary | ICD-10-CM

## 2023-03-11 DIAGNOSIS — W57XXXA Bitten or stung by nonvenomous insect and other nonvenomous arthropods, initial encounter: Secondary | ICD-10-CM | POA: Diagnosis not present

## 2023-03-11 LAB — CBC WITH DIFFERENTIAL/PLATELET
Basophils Absolute: 0.1 10*3/uL (ref 0.0–0.1)
Basophils Relative: 1.1 % (ref 0.0–3.0)
Eosinophils Absolute: 0.4 10*3/uL (ref 0.0–0.7)
Eosinophils Relative: 7 % — ABNORMAL HIGH (ref 0.0–5.0)
HCT: 33.3 % — ABNORMAL LOW (ref 39.0–52.0)
Hemoglobin: 11.2 g/dL — ABNORMAL LOW (ref 13.0–17.0)
Lymphocytes Relative: 23.6 % (ref 12.0–46.0)
Lymphs Abs: 1.5 10*3/uL (ref 0.7–4.0)
MCHC: 33.6 g/dL (ref 30.0–36.0)
MCV: 94.9 fl (ref 78.0–100.0)
Monocytes Absolute: 1 10*3/uL (ref 0.1–1.0)
Monocytes Relative: 15.1 % — ABNORMAL HIGH (ref 3.0–12.0)
Neutro Abs: 3.4 10*3/uL (ref 1.4–7.7)
Neutrophils Relative %: 53.2 % (ref 43.0–77.0)
Platelets: 178 10*3/uL (ref 150.0–400.0)
RBC: 3.51 Mil/uL — ABNORMAL LOW (ref 4.22–5.81)
RDW: 16.6 % — ABNORMAL HIGH (ref 11.5–15.5)
WBC: 6.3 10*3/uL (ref 4.0–10.5)

## 2023-03-11 LAB — COMPREHENSIVE METABOLIC PANEL
ALT: 14 U/L (ref 0–53)
AST: 18 U/L (ref 0–37)
Albumin: 3.8 g/dL (ref 3.5–5.2)
Alkaline Phosphatase: 70 U/L (ref 39–117)
BUN: 23 mg/dL (ref 6–23)
CO2: 26 mEq/L (ref 19–32)
Calcium: 8.8 mg/dL (ref 8.4–10.5)
Chloride: 105 mEq/L (ref 96–112)
Creatinine, Ser: 1.14 mg/dL (ref 0.40–1.50)
GFR: 60.19 mL/min (ref >60.00)
Glucose, Bld: 102 mg/dL — ABNORMAL HIGH (ref 70–99)
Potassium: 4.2 mEq/L (ref 3.5–5.1)
Sodium: 138 mEq/L (ref 135–145)
Total Bilirubin: 0.6 mg/dL (ref 0.2–1.2)
Total Protein: 6 g/dL (ref 6.0–8.3)

## 2023-03-11 MED ORDER — DOXYCYCLINE HYCLATE 100 MG PO TABS
100.0000 mg | ORAL_TABLET | Freq: Two times a day (BID) | ORAL | 0 refills | Status: AC
Start: 2023-03-11 — End: 2023-03-21

## 2023-03-11 NOTE — Progress Notes (Signed)
Subjective:    Patient ID: Alexander Armstrong, male    DOB: 08/03/1941, 82 y.o.   MRN: [id number]  Chief Complaint  Patient presents with   Rash    Pt in office c/o bad rash all over his body; recent chemo infusion 02/25/23; Pt states not itched much but starting to itch in some places; Not had any negative reactions till now and been taking them weekly since November; pt had recent tick bite on Monday under hair back of head;     HPI Patient is in today for new rash x 2-3 days. Here with his wife.   Rash started two days ago faintly on inner thighs, but much worse yesterday. Now having it on forearms and torso. Neck itches somewhat, otherwise not feeling very itchy.  No new medications per patient. No new soaps or detergents.   Hasn't felt "well" in the last few days, just more tired that normal. Some chills. Took Tylenol last night for some back pain. No chest pain. No fever.  No SOB. No throat or lip swelling. No HA or dizziness.   No hx of skin issues in his life. Reports one case of hives with Gain detergent.   Pulled engorged tick off his head a few days prior to this starting.   Last infusion bortezomib was 02/25/23.  Next infusion scheduled for 03/18/23.  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 Ant Stemi (11/03/04) mLAD - PCI: Taxus DES 3.0 x 20; b. (11/06/04) staged RCA DES PCI: Taxus DES 3.5 x 16; c. (01/2017) High Risk Myoview (~fixed anterior defect with normal WM) @ [facility] (Dr. Herbie Baltimore) Synergy DES 3.0 x 12 (for 99% lesion @ prox edge of old stent   Cardiac syncope    No episode since starting flecainide. Had documented WIDE COMPLEX Tachycardia on loop recorder.   Cataract bilateral cataracts   Diverticulosis    Esophageal stricture    GERD (gastroesophageal reflux disease)    Hiatal hernia 11/05/2010   HLD (hyperlipidemia)     Implantable loop recorder present    Multiple myeloma (HCC) 08/13/2022   Nonsustained paroxysmal ventricular tachycardia (HCC) 2014   Initially controlled with flecainide. Unable to induce during EP study.;  Due to CAD, flecainide discontinued -> intolerant of both mexiletine and amiodarone -> Dr. Elberta Fortis (EP-Cards) d/c'd mexilitine & statrted low dose Diltizem XT; Holter Monitor 12/2018 - HR 47-114, rate PACs/PVCs, no Afib/flutter or SVT, VT.    Pneumonia    2022   Reflux gastritis    STEMI involving left anterior descending coronary artery (HCC) 11/03/2004   [facility]: Occluded LAD treated with DES. Staged PCI to the RCA.    Past Surgical History:  Procedure Laterality Date   CARDIAC CATHETERIZATION  ~2011   [facility]: re-look Cath - patent stents   CORONARY PRESSURE/FFR STUDY N/A 01/27/2017   Procedure: Intravascular Pressure Wire/FFR Study;  Surgeon: Marykay Lex, MD;  Location: [facility] INVASIVE CV LAB;  Service: Cardiovascular;  Laterality: N/A;   CORONARY STENT INTERVENTION N/A 01/27/2017   Procedure: Coronary Stent Intervention;  Surgeon: Marykay Lex, MD;  Location: [facility] INVASIVE CV LAB: mLAD (just after D1) overlapping prior DES -> SYNERGY DES 3X12 drug eluting stent   ELECTROPHYSIOLOGIC STUDY  2014   Unable to stimulate VT seen on loop recorder  HOLTER MONITOR  12/2018    (Off of mexiletine, only on diltiazem) heart rate ranged from 47 to 114 bpm.  Rare PACs and PVCs.  Sinus rhythm noted no A. fib or other arrhythmia.   implanted loop heart monitor x2  ~2011; 2014   Per report- batter out of date; apparently captured a prolonged episode of wide complex tachycardia associated with an episode of weakness/near syncope   LEFT HEART CATH AND CORONARY ANGIOGRAPHY N/A 01/27/2017   Procedure: Left Heart Cath and Coronary Angiography;  Surgeon: Marykay Lex, MD;  Location: [facility] INVASIVE CV LAB: mLAD 99% stenosis pre-Stent & ISR --> PCI. Ost DI ~50%. pRCA DES ~50-60% FFR 0.86.    NM MYOVIEW LTD  07/2014   Unremarkable EKG. No evidence of ischemia or infarct. Mild anterior attenuation, cannot exclude prior infarct, but nonreversible. EF 66%.   NM MYOVIEW LTD  12/22/2016   INTERMEDIATE RISK. EF 58%. Defect 1: Medium size severe defect in the mid anterior, apical anterior and apical wall consistent with prior anterior MI. Defect to: Large defect and moderate severity in the basal inferoseptal basal inferior and inferoseptal mid inferior wall consistent with ischemia.   NM MYOVIEW LTD  04/05/2019   EF 55 to 65%.  Small size, mild severity fixed perfusion defect in the mid-apical anterior septal wall.  Likely represents old anterior infarct.   No evidence of ischemia. LOW RISK.    PERCUTANEOUS CORONARY STENT INTERVENTION (PCI-S)  January-February 2006   a. mLAD - Taxus DES 3.0 x 20; b. (11/06/04) staged PCI to RCA Taxus DES 3.5 x 16; c. 01/27/2017: PCI to mLAD prox to old  stent - Synergy DES 3.0 x 12   TRANSTHORACIC ECHOCARDIOGRAM  10/11/2020    [facility]):  Normal LV size & function. EF 55-60%. NO RWMA. Normal filling parameters.  Mild TR however estimated moderate pulmonary hypertension with RVSP 52 mmHg.  IVC is mildly dilated.  No vegetations noted.  No significant change from last study.   TRANSTHORACIC ECHOCARDIOGRAM  05/2020   [facility]: 05/21/2020: Normal LV size and function.  Normal valves.  No effusion.; 06/04/2020: Normal LV size and function EF 60 to 65%.  Trace MR and TR.  No pulmonary hypertension.  Stable   UMBILICAL HERNIA REPAIR     UMBILICAL HERNIA REPAIR N/A 12/08/2022   Procedure: UMBILICAL HERNIA REPAIR WITH MESH;  Surgeon: Manus Rudd, MD;  Location: [facility];  Service: General;  Laterality: N/A;    Family History  Problem Relation Age of Onset   Uterine cancer Mother    Multiple myeloma Mother    Heart disease Mother    Prostate cancer Father    Cancer Father        sarcoma   Macular degeneration Father    Heart attack  Brother    Glaucoma Paternal Aunt    Colon cancer Neg Hx    Esophageal cancer Neg Hx    Rectal cancer Neg Hx    Stomach cancer Neg Hx     Social History   Tobacco Use   Smoking status: Never   Smokeless tobacco: Never  Vaping Use   Vaping Use: Never used  Substance Use Topics   Alcohol use: No   Drug use: No     Allergies  Allergen Reactions   Latex Other (See Comments)    "takes the skin off" (tape)    Review of Systems NEGATIVE UNLESS OTHERWISE INDICATED IN HPI      Objective:  BP 98/62 (BP Location: Left Arm)   Pulse 62   Temp 98.2 F (36.8 C) (Temporal)   Ht 5\' 9"  (1.753 m)   Wt 188 lb 12.8 oz (85.6 kg)   SpO2 98%   BMI 27.88 kg/m   Wt Readings from Last 3 Encounters:  03/11/23 188 lb 12.8 oz (85.6 kg)  02/18/23 190 lb (86.2 kg)  01/28/23 188 lb 12.8 oz (85.6 kg)    BP Readings from Last 3 Encounters:  03/11/23 98/62  02/25/23 (!) 127/57  02/18/23 113/63     Physical Exam Vitals and nursing note reviewed.  Constitutional:      General: He is not in acute distress.    Appearance: Normal appearance. He is not ill-appearing.  HENT:     Nose: Nose normal.     Mouth/Throat:     Pharynx: Oropharynx is clear. No oropharyngeal exudate or posterior oropharyngeal erythema.  Eyes:     Conjunctiva/sclera: Conjunctivae normal.  Cardiovascular:     Rate and Rhythm: Normal rate and regular rhythm.     Pulses: Normal pulses.     Heart sounds: No murmur heard. Pulmonary:     Effort: Pulmonary effort is normal.     Breath sounds: Normal breath sounds.  Skin:    Findings: Rash (see photos below) present. No lesion (did not note any lesions in scalp).  Neurological:     General: No focal deficit present.     Mental Status: He is alert and oriented to person, place, and time.  Psychiatric:        Mood and Affect: Mood normal.              Assessment & Plan:  Rash and nonspecific skin eruption -     CBC with Differential/Platelet -      Comprehensive metabolic panel  Tick bite of scalp, initial encounter -     Doxycycline Hyclate; Take 1 tablet (100 mg total) by mouth 2 (two) times daily for 10 days.  Dispense: 20 tablet; Refill: 0  High risk medication use    82 yo male with hx T2DM currently undergoing MM treatment with bortezomib infusion, presenting today with 2 days history of diffuse urticarial / drug eruption type rash. No mucosal involvement. Vitals stable. No acute distress. Hx of engorged tick removal approx 72-84 hours ago. Rash spares the palms and soles, however, with chills and history of just not feeling well, plan to start on doxycycline treatment for possible tick illness x 10 days. Check CBC and CMP today. Considered lyme and RMSF testing, but this early could yield false neg results. Plan to have him push fluids, monitor symptoms and rash closely. He will keep [place] updated. I reached out to patient's PCP who is agreeable with plan. Also reached out to patient's oncologist and waiting to hear back with any further advice or input.  Red flag symptoms discussed with pt. Again, he will keep [place] updated. Recheck prn.    Return if symptoms worsen or fail to improve.  This note was prepared with assistance of Conservation officer, historic buildings. Occasional wrong-word or sound-a-like substitutions may have occurred due to the inherent limitations of voice recognition software.  Time Spent: 30 minutes of total time was spent on the date of the encounter performing the following actions: chart review prior to seeing the patient, obtaining history, performing a medically necessary exam, counseling on the treatment plan, placing orders, and documenting in our EHR.       Ananiah Maciolek  M Kaveri Perras, PA-C

## 2023-03-12 ENCOUNTER — Other Ambulatory Visit: Payer: Self-pay

## 2023-03-18 ENCOUNTER — Other Ambulatory Visit: Payer: Self-pay | Admitting: *Deleted

## 2023-03-18 ENCOUNTER — Inpatient Hospital Stay: Payer: Medicare Other

## 2023-03-18 ENCOUNTER — Inpatient Hospital Stay: Payer: Medicare Other | Attending: Hematology & Oncology

## 2023-03-18 ENCOUNTER — Inpatient Hospital Stay (HOSPITAL_BASED_OUTPATIENT_CLINIC_OR_DEPARTMENT_OTHER): Payer: Medicare Other | Admitting: Hematology & Oncology

## 2023-03-18 ENCOUNTER — Encounter: Payer: Self-pay | Admitting: Hematology & Oncology

## 2023-03-18 VITALS — BP 124/67 | HR 62 | Temp 97.8°F | Resp 20 | Ht 69.0 in | Wt 190.1 lb

## 2023-03-18 VITALS — BP 98/64 | HR 54 | Temp 97.7°F

## 2023-03-18 DIAGNOSIS — Q999 Chromosomal abnormality, unspecified: Secondary | ICD-10-CM | POA: Diagnosis not present

## 2023-03-18 DIAGNOSIS — Z5112 Encounter for antineoplastic immunotherapy: Secondary | ICD-10-CM | POA: Diagnosis present

## 2023-03-18 DIAGNOSIS — C9000 Multiple myeloma not having achieved remission: Secondary | ICD-10-CM | POA: Insufficient documentation

## 2023-03-18 DIAGNOSIS — D630 Anemia in neoplastic disease: Secondary | ICD-10-CM | POA: Diagnosis not present

## 2023-03-18 DIAGNOSIS — D469 Myelodysplastic syndrome, unspecified: Secondary | ICD-10-CM | POA: Insufficient documentation

## 2023-03-18 LAB — CMP (CANCER CENTER ONLY)
ALT: 18 U/L (ref 0–44)
AST: 16 U/L (ref 15–41)
Albumin: 4.2 g/dL (ref 3.5–5.0)
Alkaline Phosphatase: 75 U/L (ref 38–126)
Anion gap: 6 (ref 5–15)
BUN: 26 mg/dL — ABNORMAL HIGH (ref 8–23)
CO2: 28 mmol/L (ref 22–32)
Calcium: 9.8 mg/dL (ref 8.9–10.3)
Chloride: 107 mmol/L (ref 98–111)
Creatinine: 1.16 mg/dL (ref 0.61–1.24)
GFR, Estimated: >60 mL/min (ref >60)
Glucose, Bld: 145 mg/dL — ABNORMAL HIGH (ref 70–99)
Potassium: 4.3 mmol/L (ref 3.5–5.1)
Sodium: 141 mmol/L (ref 135–145)
Total Bilirubin: 0.5 mg/dL (ref 0.3–1.2)
Total Protein: 6.1 g/dL — ABNORMAL LOW (ref 6.5–8.1)

## 2023-03-18 LAB — CBC WITH DIFFERENTIAL (CANCER CENTER ONLY)
Abs Immature Granulocytes: 0.02 10*3/uL (ref 0.00–0.07)
Basophils Absolute: 0.2 10*3/uL — ABNORMAL HIGH (ref 0.0–0.1)
Basophils Relative: 3 %
Eosinophils Absolute: 0.3 10*3/uL (ref 0.0–0.5)
Eosinophils Relative: 5 %
HCT: 32.6 % — ABNORMAL LOW (ref 39.0–52.0)
Hemoglobin: 11 g/dL — ABNORMAL LOW (ref 13.0–17.0)
Immature Granulocytes: 0 %
Lymphocytes Relative: 29 %
Lymphs Abs: 1.4 10*3/uL (ref 0.7–4.0)
MCH: 32.4 pg (ref 26.0–34.0)
MCHC: 33.7 g/dL (ref 30.0–36.0)
MCV: 96.2 fL (ref 80.0–100.0)
Monocytes Absolute: 0.6 10*3/uL (ref 0.1–1.0)
Monocytes Relative: 12 %
Neutro Abs: 2.5 10*3/uL (ref 1.7–7.7)
Neutrophils Relative %: 51 %
Platelet Count: 185 10*3/uL (ref 150–400)
RBC: 3.39 MIL/uL — ABNORMAL LOW (ref 4.22–5.81)
RDW: 15.1 % (ref 11.5–15.5)
WBC Count: 5 10*3/uL (ref 4.0–10.5)
nRBC: 0 % (ref 0.0–0.2)

## 2023-03-18 LAB — LACTATE DEHYDROGENASE: LDH: 142 U/L (ref 98–192)

## 2023-03-18 MED ORDER — BORTEZOMIB CHEMO SQ INJECTION 3.5 MG (2.5MG/ML)
1.3000 mg/m2 | Freq: Once | INTRAMUSCULAR | Status: AC
  Administered 2023-03-18: 2.75 mg via SUBCUTANEOUS
  Filled 2023-03-18: qty 1.1

## 2023-03-18 MED ORDER — DARATUMUMAB-HYALURONIDASE-FIHJ 1800-30000 MG-UT/15ML ~~LOC~~ SOLN
1800.0000 mg | Freq: Once | SUBCUTANEOUS | Status: AC
  Administered 2023-03-18: 1800 mg via SUBCUTANEOUS
  Filled 2023-03-18: qty 15

## 2023-03-18 MED ORDER — LENALIDOMIDE 15 MG PO CAPS: 15.0000 mg | ORAL_CAPSULE | Freq: Every day | ORAL | 0 refills | Status: DC

## 2023-03-18 MED ORDER — ACETAMINOPHEN 325 MG PO TABS: 650.0000 mg | ORAL_TABLET | Freq: Once | ORAL | Status: DC

## 2023-03-18 MED ORDER — DIPHENHYDRAMINE HCL 25 MG PO CAPS: 50.0000 mg | ORAL_CAPSULE | Freq: Once | ORAL | Status: DC

## 2023-03-18 NOTE — Progress Notes (Signed)
Careplan updated per Dr. Gustavo Lah instructions.   Patient will receive Dara/Velcade d1 and Velcade d15 of each 28 day cycle beginning with cycle 7.

## 2023-03-18 NOTE — Progress Notes (Signed)
Hematology and Oncology Follow Up Visit  Alexander Armstrong [id number] [birth year] 82 y.o. 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 --Start on 10/29/2022     Interim History:  Alexander Armstrong is back for follow-up.  He is doing okay.  He has been tolerating treatment quite nicely.  He has responded as a thought.  However, we really need to get a bone marrow biopsy on him.  We really need to get a follow-up to see how everything looks, more importantly, to see how his chromosomes look.  When we last saw him, there was no monoclonal spike in his blood.  His IgG level was 595 mg/dL.  His Kappa light chain was 1.5 mg/dL.  He has had no fever.  He has had no bleeding.  He has recovered from his hernia surgery.  Caveats he is back on Revlimid.  I think this is okay.  He seems to be tolerating this fairly well.  He has had no bleeding.  He has had no rashes.  He has had no problems with leg swelling.  Overall, I would say that his performance status is probably ECOG 1.    Medications:  Current Outpatient Medications:    acetaminophen (TYLENOL) 500 MG tablet, Take 500 mg by mouth 3 (three) times daily as needed for moderate pain or headache., Disp: , Rfl:    Cholecalciferol (VITAMIN D3) 1000 units CAPS, Take 1,000 Units by mouth at bedtime., Disp: , Rfl:    clopidogrel (PLAVIX) 75 MG tablet, TAKE 1 TABLET(75 MG) BY MOUTH DAILY (Patient taking differently: Take 75 mg by mouth in the morning.), Disp: 90 tablet, Rfl: 2   Cyanocobalamin 1000 MCG TBCR, Take 1,000 mcg by mouth daily., Disp: , Rfl:    dexamethasone (DECADRON) 4 MG tablet, Take 5 tabs (20 mg) weekly the day of daratumumab. Take with breakfast. (Patient taking differently: Take 20 mg  by mouth See admin instructions. Take 20 mg with breakfast by mouth one hour prior to Daratumumab infusion on Mondays), Disp: 60 tablet, Rfl: 5   diphenhydrAMINE (BENADRYL ALLERGY) 25 MG tablet, Take 50 mg by mouth See admin instructions. Take 50 mg by mouth one hour prior to Daratumumab infusion on Mondays, Disp: , Rfl:    Docusate Sodium (DSS) 100 MG CAPS, Take by mouth daily., Disp: , Rfl:    doxycycline (VIBRA-TABS) 100 MG tablet, Take 1 tablet (100 mg total) by mouth 2 (two) times daily for 10 days., Disp: 20 tablet, Rfl: 0   famciclovir (FAMVIR) 250 MG tablet, TAKE 1 TABLET BY MOUTH EVERY DAY, Disp: 90 tablet, Rfl: 2   fluticasone (FLONASE) 50 MCG/ACT nasal spray, Place 1 spray into both nostrils daily as needed for allergies or rhinitis., Disp: , Rfl:    lenalidomide (REVLIMID) 15 MG capsule, Take 1 capsule (15 mg total) by mouth daily. Alexander Armstrong #[id number]    Date Obtained:  01/28/2023, Disp: 21 capsule, Rfl: 0   loratadine (CLARITIN) 10 MG tablet, Take 10 mg by mouth in the morning., Disp: , Rfl:    MAGNESIUM PO, Take by mouth daily., Disp: , Rfl:    montelukast (SINGULAIR) 10 MG tablet, TAKE 1 TABLET BY MOUTH EVERYDAY AT BEDTIME, Disp: 90 tablet, Rfl: 2   pantoprazole (PROTONIX) 40 MG tablet, Take 1 tablet (40  mg total) by mouth daily. (Patient taking differently: Take 40 mg by mouth daily before breakfast.), Disp: 90 tablet, Rfl: 3   polyethylene glycol (MIRALAX / GLYCOLAX) 17 g packet, Take 17 g by mouth daily., Disp: , Rfl:    REFRESH PLUS 0.5 % SOLN, Place 1 drop into both eyes 3 (three) times daily as needed (for dryness)., Disp: , Rfl:    rosuvastatin (CRESTOR) 20 MG tablet, Take 1 tablet (20 mg total) by mouth daily., Disp: 90 tablet, Rfl: 3   tamsulosin (FLOMAX) 0.4 MG CAPS capsule, Take 0.4 mg by mouth at bedtime., Disp: , Rfl:   Allergies:  Allergies  Allergen Reactions   Latex Other (See Comments)    "takes the skin off" (tape)    Past Medical History, Surgical history,  Social history, and Family History were reviewed and updated.  Review of Systems: Review of Systems  Constitutional: Negative.   HENT:  Negative.    Eyes: Negative.   Respiratory: Negative.    Cardiovascular: Negative.   Gastrointestinal: Negative.   Endocrine: Negative.   Genitourinary: Negative.    Musculoskeletal: Negative.   Skin: Negative.   Neurological: Negative.   Hematological: Negative.   Psychiatric/Behavioral: Negative.      Physical Exam:  height is 5\' 9"  (1.753 m) and weight is 190 lb 1.3 oz (86.2 kg). His oral temperature is 97.8 F (36.6 C). His blood pressure is 124/67 and his pulse is 62. His respiration is 20 and oxygen saturation is 100%.   Wt Readings from Last 3 Encounters:  03/18/23 190 lb 1.3 oz (86.2 kg)  03/11/23 188 lb 12.8 oz (85.6 kg)  02/18/23 190 lb (86.2 kg)    Physical Exam Vitals reviewed.  HENT:     Head: Normocephalic and atraumatic.  Eyes:     Pupils: Pupils are equal, round, and reactive to light.  Cardiovascular:     Rate and Rhythm: Normal rate and regular rhythm.     Heart sounds: Normal heart sounds.  Pulmonary:     Effort: Pulmonary effort is normal.     Breath sounds: Normal breath sounds.  Abdominal:     General: Bowel sounds are normal.     Palpations: Abdomen is soft.     Comments: Abdominal exam is somewhat distended.  The umbilical hernia is reducible.  He has active bowel sounds.  There is no guarding or rebound tenderness.  He has no palpable liver or spleen tip.  There is no fluid wave.    Musculoskeletal:        General: No tenderness or deformity. Normal range of motion.     Cervical back: Normal range of motion.  Lymphadenopathy:     Cervical: No cervical adenopathy.  Skin:    General: Skin is warm and dry.     Findings: No erythema or rash.  Neurological:     Mental Status: He is alert and oriented to person, place, and time.  Psychiatric:        Behavior: Behavior normal.        Thought Content: Thought  content normal.        Judgment: Judgment normal.     Lab Results  Component Value Date   WBC 5.0 03/18/2023   HGB 11.0 (L) 03/18/2023   HCT 32.6 (L) 03/18/2023   MCV 96.2 03/18/2023   PLT 185 03/18/2023     Chemistry      Component Value Date/Time   NA 141 03/18/2023 0927   K 4.3 03/18/2023  0927   CL 107 03/18/2023 0927   CO2 28 03/18/2023 0927   BUN 26 (H) 03/18/2023 0927   CREATININE 1.16 03/18/2023 0927   CREATININE 1.33 (H) 01/21/2017 1616      Component Value Date/Time   CALCIUM 9.8 03/18/2023 0927   ALKPHOS 75 03/18/2023 0927   AST 16 03/18/2023 0927   ALT 18 03/18/2023 0927   BILITOT 0.5 03/18/2023 1610       Impression and Plan: Mr. Imperatore is a very nice 82 year old white male.  He has IgG kappa myeloma.  I think what troubles me is his cytogenetics.  He has a very complex karyotype.  Again he probably has about 12 different abnormalities with his chromosomes in the bone marrow.  I suspect this probably will put him at high risk.  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  We will see what a bone marrow biopsy shows.  I think this would be incredibly important for [place] to get.  I will try to set this up for a couple weeks.  I am just happy that he is responding to treatment for the myeloma.  I do suspect that there might be some underlying myelodysplasia given his chromosomal abnormalities.  This might be where we we will see a benefit from Revlimid.  We will still get him back in 1 month.    Josph Macho, MD 6/13/202411:02 AM

## 2023-03-18 NOTE — Patient Instructions (Signed)
[facility]  Discharge Instructions: Thank you for choosing [facility] to provide your oncology and hematology care.   If you have a lab appointment with the Cancer Center, please go directly to the Cancer Center and check in at the registration area.  Wear comfortable clothing and clothing appropriate for easy access to any Portacath or PICC line.   We strive to give you quality time with your provider. You may need to reschedule your appointment if you arrive late (15 or more minutes).  Arriving late affects you and other patients whose appointments are after yours.  Also, if you miss three or more appointments without notifying the office, you may be dismissed from the clinic at the provider's discretion.      For prescription refill requests, have your pharmacy contact our office and allow 72 hours for refills to be completed.    Today you received the following chemotherapy and/or immunotherapy agents Velcade and Faspro   To help prevent nausea and vomiting after your treatment, we encourage you to take your nausea medication as directed.  BELOW ARE SYMPTOMS THAT SHOULD BE REPORTED IMMEDIATELY: *FEVER GREATER THAN 100.4 F (38 C) OR HIGHER *CHILLS OR SWEATING *NAUSEA AND VOMITING THAT IS NOT CONTROLLED WITH YOUR NAUSEA MEDICATION *UNUSUAL SHORTNESS OF BREATH *UNUSUAL BRUISING OR BLEEDING *URINARY PROBLEMS (pain or burning when urinating, or frequent urination) *BOWEL PROBLEMS (unusual diarrhea, constipation, pain near the anus) TENDERNESS IN MOUTH AND THROAT WITH OR WITHOUT PRESENCE OF ULCERS (sore throat, sores in mouth, or a toothache) UNUSUAL RASH, SWELLING OR PAIN  UNUSUAL VAGINAL DISCHARGE OR ITCHING   Items with * indicate a potential emergency and should be followed up as soon as possible or go to the Emergency Department if any problems should occur.  Please show the CHEMOTHERAPY ALERT CARD or IMMUNOTHERAPY ALERT CARD at  check-in to the Emergency Department and triage nurse. Should you have questions after your visit or need to cancel or reschedule your appointment, please contact [facility]  [phone] and follow the prompts.  Office hours are 8:00 a.m. to 4:30 p.m. Monday - Friday. Please note that voicemails left after 4:00 p.m. may not be returned until the following business day.  We are closed weekends and major holidays. You have access to a nurse at all times for urgent questions. Please call the main number to the clinic [phone] and follow the prompts.  For any non-urgent questions, you may also contact your provider using MyChart. We now offer e-Visits for anyone 18 and older to request care online for non-urgent symptoms. For details visit [email or web address].   Also download the MyChart app! Go to the app store, search "MyChart", open the app, select [facility], and log in with your MyChart username and password.  

## 2023-03-19 LAB — KAPPA/LAMBDA LIGHT CHAINS
Kappa free light chain: 15.6 mg/L (ref 3.3–19.4)
Kappa, lambda light chain ratio: 1.39 (ref 0.26–1.65)
Lambda free light chains: 11.2 mg/L (ref 5.7–26.3)

## 2023-03-19 LAB — IGG, IGA, IGM
IgA: 37 mg/dL — ABNORMAL LOW (ref 61–437)
IgG (Immunoglobin G), Serum: 635 mg/dL (ref 603–1613)
IgM (Immunoglobulin M), Srm: 9 mg/dL — ABNORMAL LOW (ref 15–143)

## 2023-03-22 LAB — PROTEIN ELECTROPHORESIS, SERUM, WITH REFLEX
A/G Ratio: 1.5 (ref 0.7–1.7)
Albumin ELP: 3.5 g/dL (ref 2.9–4.4)
Alpha-1-Globulin: 0.3 g/dL (ref 0.0–0.4)
Alpha-2-Globulin: 0.7 g/dL (ref 0.4–1.0)
Beta Globulin: 0.8 g/dL (ref 0.7–1.3)
Gamma Globulin: 0.6 g/dL (ref 0.4–1.8)
Globulin, Total: 2.4 g/dL (ref 2.2–3.9)
Total Protein ELP: 5.9 g/dL — ABNORMAL LOW (ref 6.0–8.5)

## 2023-03-23 ENCOUNTER — Inpatient Hospital Stay: Payer: Medicare Other

## 2023-03-23 DIAGNOSIS — C9000 Multiple myeloma not having achieved remission: Secondary | ICD-10-CM

## 2023-03-23 DIAGNOSIS — Z5112 Encounter for antineoplastic immunotherapy: Secondary | ICD-10-CM | POA: Diagnosis not present

## 2023-03-23 IMAGING — CR DG CHEST 2V
2 series · 2 of 2 positions shown · non-contrast
Comparison: 10/07/2020

CLINICAL DATA: Cough

EXAM:
CHEST - 2 VIEW

[w chest pa]
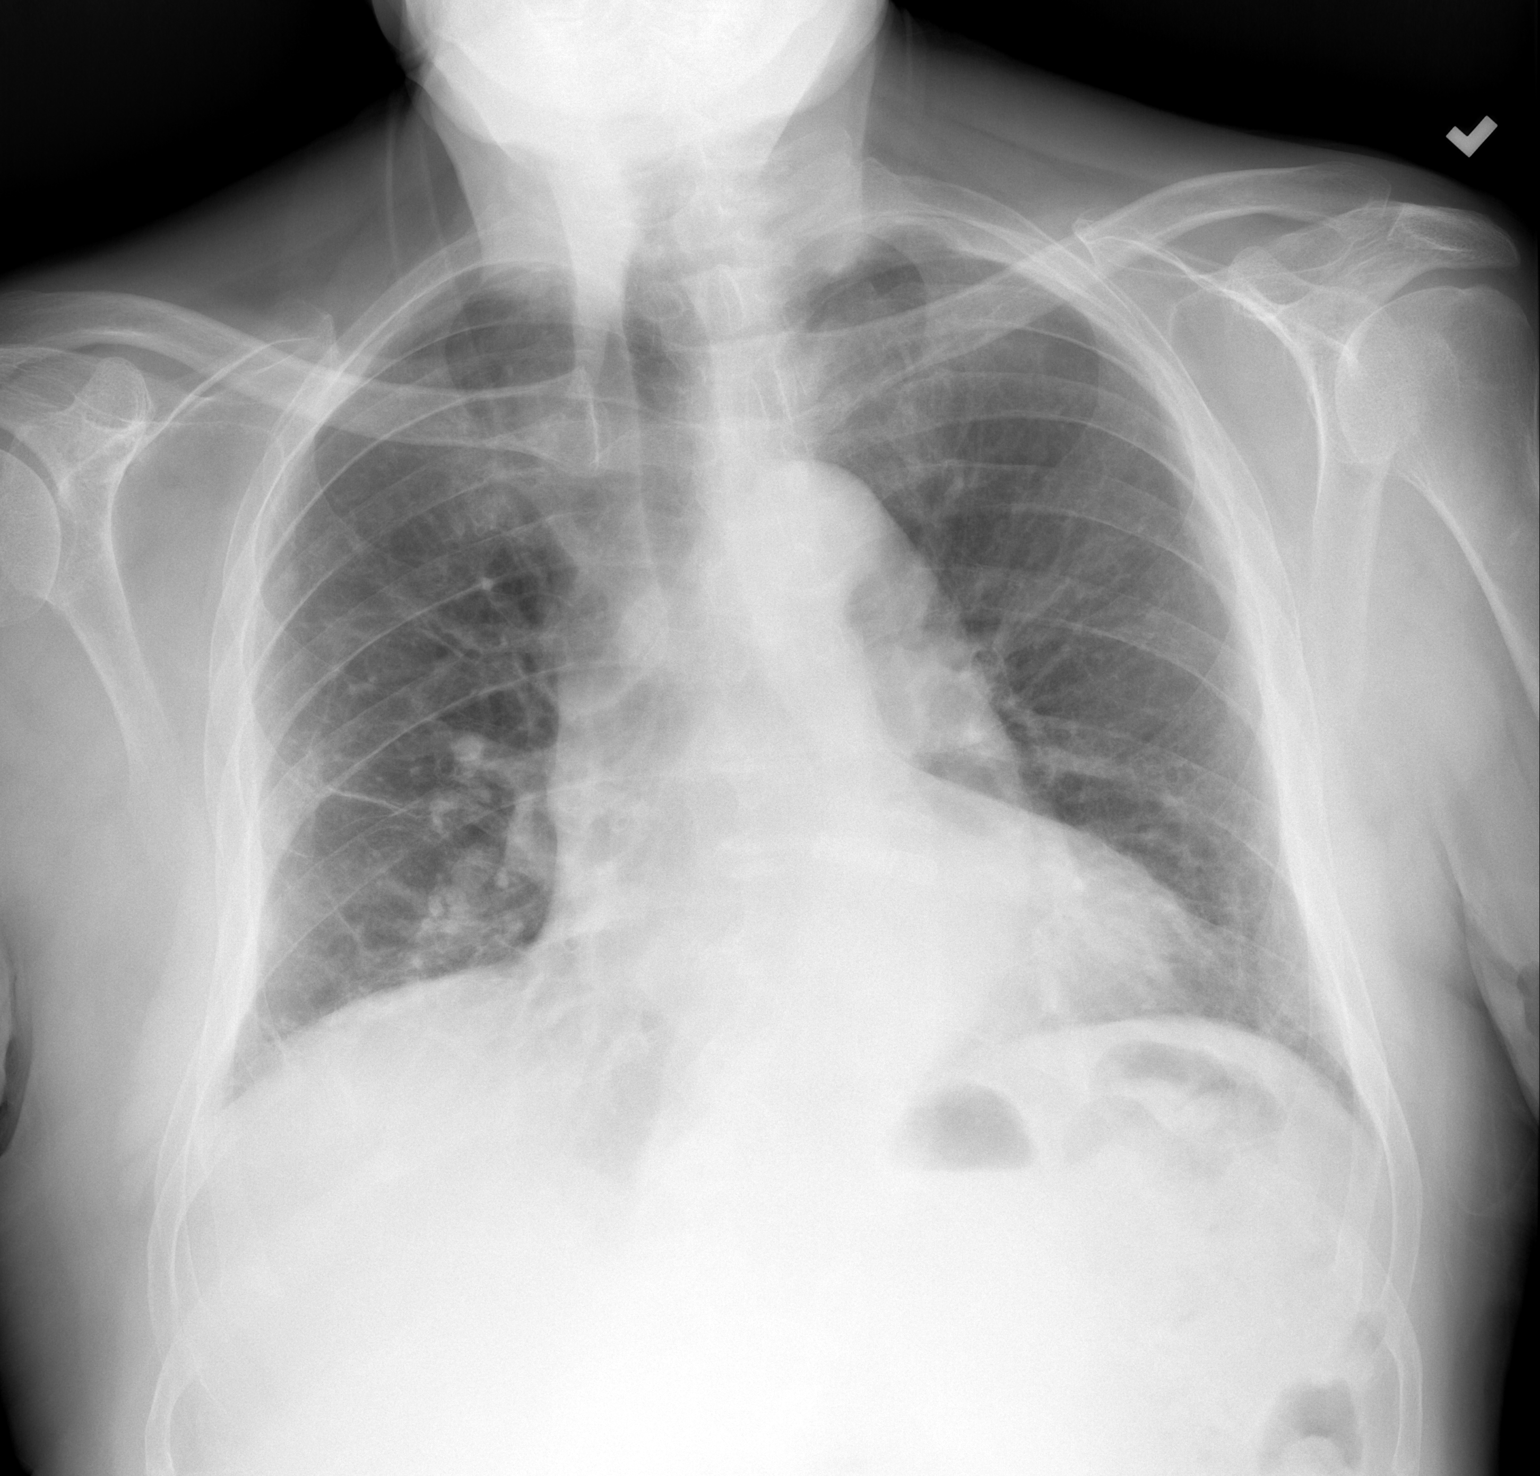

[w chest lat]
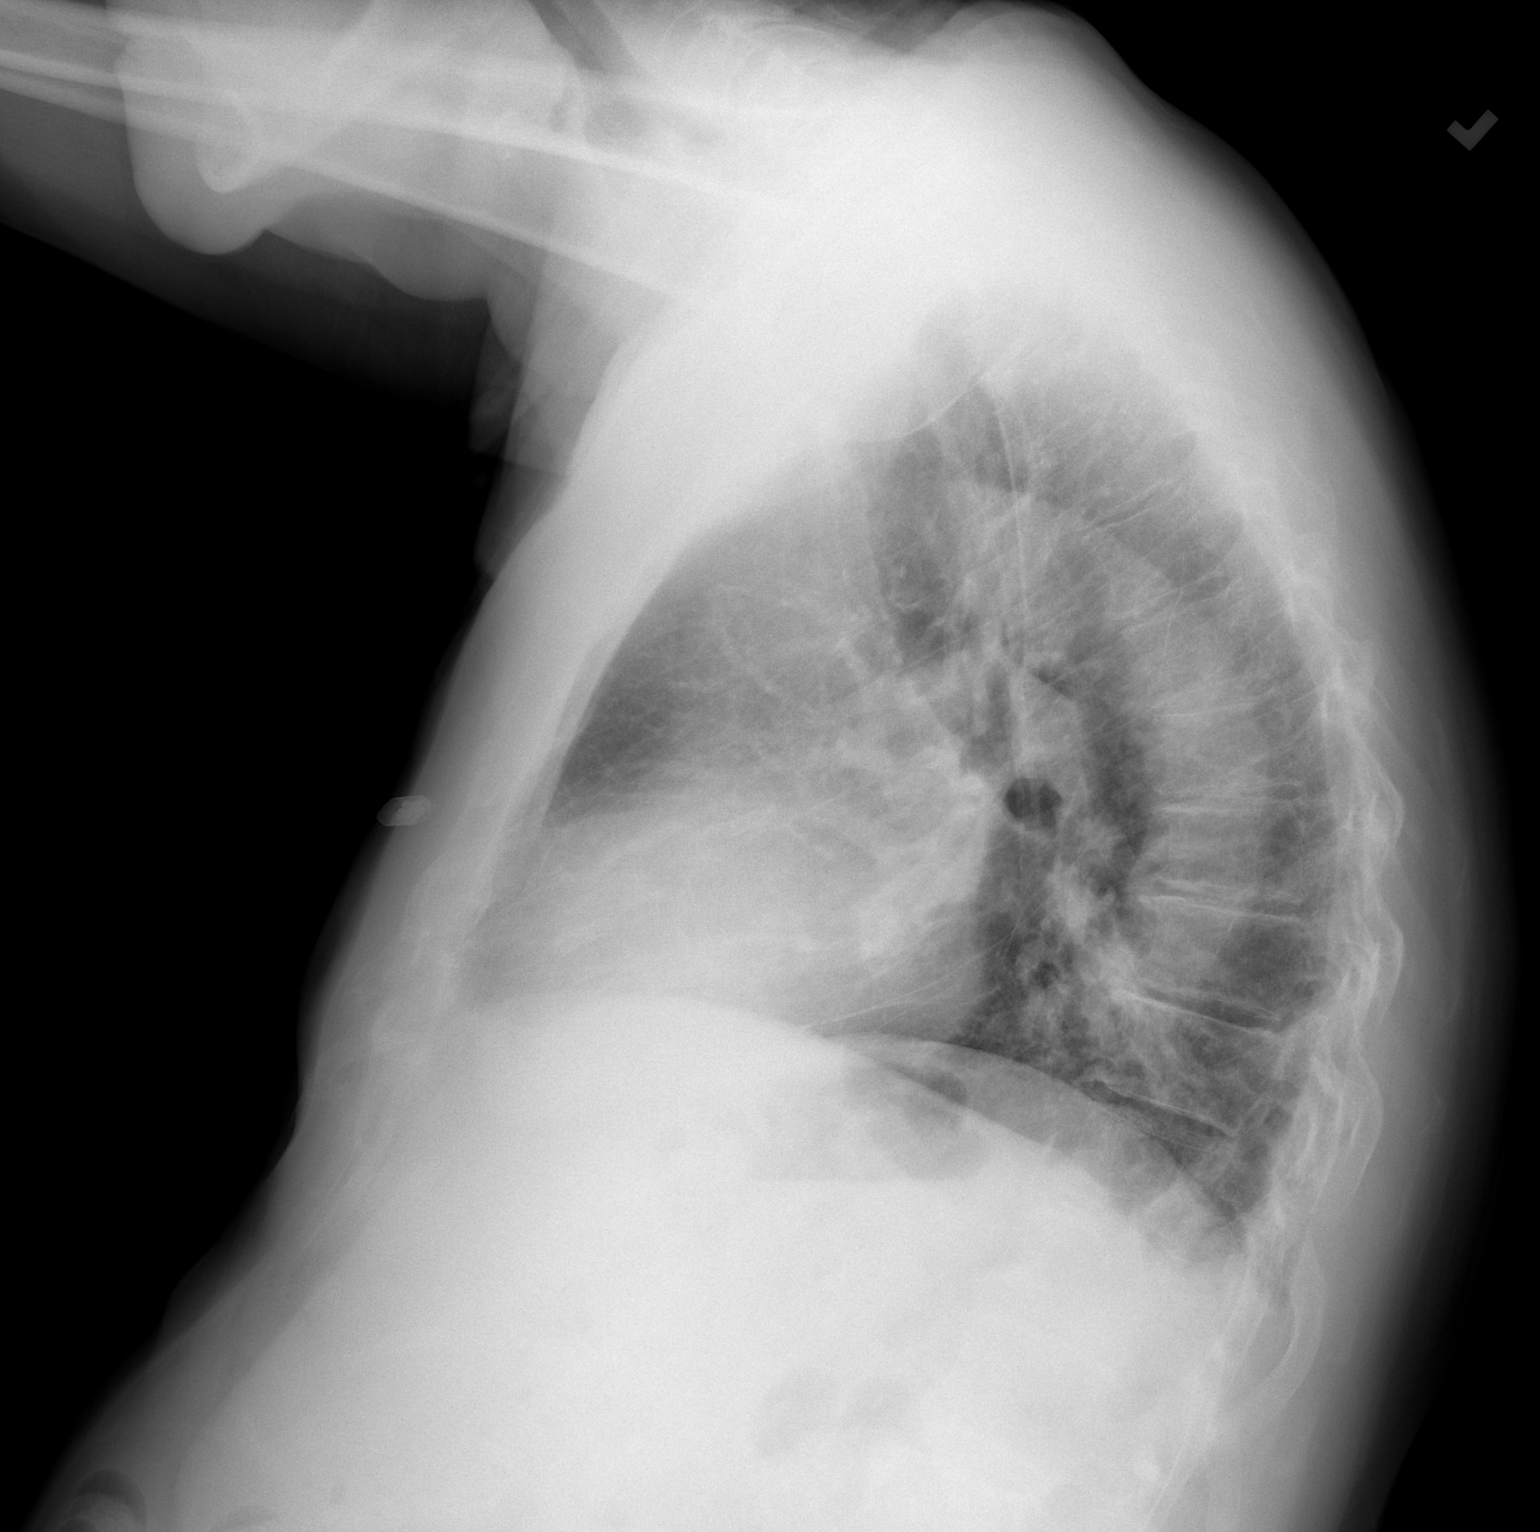

[2 of 2 positions shown; findings below may reference images not displayed]

FINDINGS: Heart size upper limits of normal. Chronic tortuosity of the aorta.
Loop recorder in place. Radiographic improvement with near complete
resolution of patchy densities in the mid and lower lungs. There may
be very minimal residual density at the bases, which could represent
scarring or minimal basilar infiltrate. No effusion. No acute bone
finding.
IMPRESSION: Pulmonary infiltrates seen in Monday October, 2020 have resolved. Minimal
patchy density at the bases could be scarring or minimal recurrent
disease.

## 2023-03-24 ENCOUNTER — Ambulatory Visit: Payer: Medicare Other | Attending: Cardiology | Admitting: Cardiology

## 2023-03-24 VITALS — BP 96/52 | HR 82 | Ht 70.0 in | Wt 190.0 lb

## 2023-03-24 DIAGNOSIS — E1169 Type 2 diabetes mellitus with other specified complication: Secondary | ICD-10-CM

## 2023-03-24 DIAGNOSIS — I9589 Other hypotension: Secondary | ICD-10-CM | POA: Insufficient documentation

## 2023-03-24 DIAGNOSIS — I4729 Other ventricular tachycardia: Secondary | ICD-10-CM | POA: Insufficient documentation

## 2023-03-24 DIAGNOSIS — I2510 Atherosclerotic heart disease of native coronary artery without angina pectoris: Secondary | ICD-10-CM | POA: Insufficient documentation

## 2023-03-24 DIAGNOSIS — E785 Hyperlipidemia, unspecified: Secondary | ICD-10-CM | POA: Diagnosis present

## 2023-03-24 DIAGNOSIS — R002 Palpitations: Secondary | ICD-10-CM | POA: Insufficient documentation

## 2023-03-24 MED ORDER — DILTIAZEM HCL ER BEADS 120 MG PO CP24: 120.0000 mg | ORAL_CAPSULE | Freq: Every day | ORAL | 3 refills | Status: DC

## 2023-03-24 NOTE — Progress Notes (Signed)
Primary Care Provider: Shelva Majestic, MD [facility] Cardiologist: Bryan Lemma, MD Electrophysiologist: Will Jorja Loa, MD Oncologist: Dr. Myna Hidalgo  Clinic Note: Chief Complaint  Patient presents with   Follow-up    6 months.  Doing relatively well.   Coronary Artery Disease    No angina   Palpitations    Noting more frequent palpitations.    ===================================  ASSESSMENT/PLAN   Problem List Items Addressed This Visit       Cardiology Problems   Paroxysmal VT (HCC) (Chronic)    He has had some short bursts of fast heart rate spells, but not lasting all that long.  He had been doing well on diltiazem, but came off because of issues with blood pressure lipids illnesses.  Plan: Restart low-dose diltiazem 120 mg daily.  Episodes will not last long enough for her to take PRN short acting diltiazem.  Thankfully, ischemic evaluation was negative.      Relevant Medications   diltiazem (TIAZAC) 120 MG 24 hr capsule   Hypotension (Chronic)    He still has a blood pressure in the 90s.  I think it would be okay with low-dose diltiazem as long as he takes it in the afternoon or at least after breakfast and lunch.      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  Will continue diltiazem 20 mg daily for now, but low threshold to titrate further.  I think this is probably having to do with his chemotherapy treatments.  I would be reluctant to titrate up statin while he is on Revlimid.  A1c 7.1 also probably thrown off by his dexamethasone etc.  He actually is not on any medications for diabetes-would consider SGLT2 inhibitor.        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  He had PCI to the LAD in 2018 and done well since then.  Completely asymptomatic.  Most recent Myoview was in July 2022 that was normal with normal echocardiogram.  This was to evaluate bursts of VT.  Plan: GDMT is limited by his borderline blood pressures.  Had not tolerated beta-blocker because of fatigue, or ARB due to hypotension. With palpitations recurring, we will restart low-dose diltiazem 120 mg daily. Continue rosuvastatin 25 daily.  Lipids been well-controlled. Continue maintenance Plavix-okay to hold for procedures or surgeries.  (5 to 7 days depending on procedure).      Relevant Medications   diltiazem (TIAZAC) 120 MG 24 hr capsule     Other   Palpitations - Primary (Chronic)    Thankfully, when he wore monitor last time he did not have any PET A-fib or SVT just short little ectopy runs.  As such, I think were probably okay go back to diltiazem 120 mg daily.  Adequate hydration.      ===================================  HPI:    Alexander Armstrong is a 82 y.o. male with a PMH notable for CAD-PCI, paroxysmal VT, CRF's of DM-2 (A1c now 7.1), HTN, HLD and multiple Aloma with myelodysplasia.  Alexander Armstrong presents today for essentially 81-month follow-up.  Alexander Armstrong was last seen on 08/07/2022 => doing well from  a cardiac standpoint.  No reported heart failure or angina symptoms.  No new symptoms..  Has been referred to Dr. Myna Hidalgo for evaluation of M spike and UPEP/SPEP.  The initial discovery was due to fatigue-exercise tolerance and found to have anemia.  This was initially thought to be related to COVID and with back-to-back hospitalizations, but was found to have more than that.  No angina or heart failure symptoms.  Exercise intolerance due to anemia.  Had put back on weight-regaining strength.  Still has some poor balance.  Most recent visit was a virtual preop clearance visit on October 20, 2022 -> continue to do well.  No major issues.  No chest pain dyspnea edema, fatigue, palpitations etc. => OK for surgery-hold Plavix also probable bone marrow biopsy 7 days.  Recent Hospitalizations:  Umbilical Hernia Repair 12/08/2022  Most recent visit with Dr. Myna Hidalgo on June 13-> for follow-up for his IgG kappa myeloma with troublesome cytogenetics.  Plan for bone marrow biopsy.  Thankful for decent response to treatment but also consider possible underlying myelodysplasia.  Hopefully benefiting from Revlimid.  Reviewed  CV studies:    The following studies were reviewed today: (if available, images/films reviewed: From Epic Chart or Care Everywhere) No new studies:  Interval History:   Alexander Armstrong returns today stating that over the last month or 2 he has been noticing more frequent little short bursts of fast heart rate.  He had been doing relatively well off of diltiazem but now seems to think that the palpitations are getting worse.  Not lasting long enough for her to get syncope or near syncope symptoms but he does get dizzy and little worn out when they have occur.  With count of random.  Processed feelings lipid fatigue from his treatments for multiple multilevel lumbar, he is doing pretty well overall.  He denies chest pain or pressure/exertion.  No PND orthopnea or edema.  He is not as active as he had been, probably because of all the treatments and office visits etc.  Still looser making office chairs.  CV Review of Symptoms (Summary): no chest pain or dyspnea on exertion positive for - irregular heartbeat, palpitations, rapid heart rate, and please episodes occurring little more frequently than had been before. negative for - edema, orthopnea, paroxysmal nocturnal dyspnea, shortness of breath, or syncope/near syncope or TIA/RCVS or claudication.  REVIEWED OF SYSTEMS   Review of Systems  Constitutional:  Positive for malaise/fatigue (Just not as active as he had been.   Little tired, but getting stronger.). Negative for weight loss.  HENT:  Negative for congestion and nosebleeds.   Respiratory:  Negative for cough and shortness of breath.   Cardiovascular:  Positive for palpitations. Negative for leg swelling.  Gastrointestinal:  Negative for blood in stool and melena.  Genitourinary:  Negative for hematuria.  Musculoskeletal:  Negative for back pain and joint pain.  Neurological:  Positive for dizziness. Negative for focal weakness.  Psychiatric/Behavioral:  Negative for depression and memory loss. The patient is not nervous/anxious and does not have insomnia.     I have reviewed and (if needed) personally updated the patient's problem list, medications, allergies, past medical and surgical history, social and family history.   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 Ant Stemi (11/03/04)  mLAD - PCI: Taxus DES 3.0 x 20; b. (11/06/04) staged RCA DES PCI: Taxus DES 3.5 x 16; c. (01/2017) High Risk Myoview (~fixed anterior defect with normal WM) @ [facility] (Dr. Herbie Baltimore) Synergy DES 3.0 x 12 (for 99% lesion @ prox edge of old stent   Cardiac syncope    No episode since starting flecainide. Had documented WIDE COMPLEX Tachycardia on loop recorder.   Cataract bilateral cataracts   Diverticulosis    Esophageal stricture    GERD (gastroesophageal reflux disease)    Hiatal hernia 11/05/2010   HLD (hyperlipidemia)    Implantable loop recorder present    Multiple myeloma (HCC) 08/13/2022   Nonsustained paroxysmal ventricular tachycardia (HCC) 2014   Initially controlled with flecainide. Unable to induce during EP study.;  Due to CAD, flecainide discontinued -> intolerant of both mexiletine and amiodarone -> Dr. Elberta Fortis (EP-Cards) d/c'd mexilitine & statrted low dose Diltizem XT; Holter Monitor 12/2018  - HR 47-114, rate PACs/PVCs, no Afib/flutter or SVT, VT.    Pneumonia    2022   Reflux gastritis    STEMI involving left anterior descending coronary artery (HCC) 11/03/2004   [facility]: Occluded LAD treated with DES. Staged PCI to the RCA.    PAST SURGICAL HISTORY   Past Surgical History:  Procedure Laterality Date   CARDIAC CATHETERIZATION  ~2011   [facility]: re-look Cath - patent stents   CORONARY PRESSURE/FFR STUDY N/A 01/27/2017   Procedure: Intravascular Pressure Wire/FFR Study;  Surgeon: Marykay Lex, MD;  Location: [facility] INVASIVE CV LAB;  Service: Cardiovascular;  Laterality: N/A;   CORONARY STENT INTERVENTION N/A 01/27/2017   Procedure: Coronary Stent Intervention;  Surgeon: Marykay Lex, MD;  Location: [facility] INVASIVE CV LAB: mLAD (just after D1) overlapping prior DES -> SYNERGY DES 3X12 drug eluting stent   ELECTROPHYSIOLOGIC STUDY  2014   Unable to stimulate VT seen on loop recorder   HOLTER MONITOR  12/2018    (Off of mexiletine, only on diltiazem) heart rate ranged from 47 to 114 bpm.  Rare PACs and PVCs.  Sinus rhythm noted no A. fib or other arrhythmia.   implanted loop heart monitor x2  ~2011; 2014   Per report- batter out of date; apparently captured a prolonged episode of wide complex tachycardia associated with an episode of weakness/near syncope   LEFT HEART CATH AND CORONARY ANGIOGRAPHY N/A 01/27/2017   Procedure: Left Heart Cath and Coronary Angiography;  Surgeon: Marykay Lex, MD;  Location: [facility] INVASIVE CV LAB: mLAD 99% stenosis pre-Stent & ISR --> PCI. Ost DI ~50%. pRCA DES ~50-60% FFR 0.86.   NM MYOVIEW LTD  07/2014   Unremarkable EKG. No evidence of ischemia or infarct. Mild anterior attenuation, cannot exclude prior infarct, but nonreversible. EF 66%.   NM MYOVIEW LTD  12/22/2016   INTERMEDIATE RISK. EF 58%. Defect 1: Medium size severe defect in the mid anterior, apical anterior and apical wall consistent with prior anterior MI. Defect to: Large  defect and moderate severity in the basal inferoseptal basal inferior and inferoseptal mid inferior wall consistent with ischemia.   NM MYOVIEW LTD  04/05/2019   EF 55 to 65%.  Small size, mild severity fixed perfusion defect in the mid-apical anterior septal wall.  Likely represents old anterior infarct.   No evidence of ischemia. LOW RISK.    PERCUTANEOUS CORONARY STENT INTERVENTION (PCI-S)  January-February 2006   a. mLAD - Taxus DES 3.0 x 20; b. (11/06/04) staged PCI to RCA Taxus DES  3.5 x 16; c. 01/27/2017: PCI to mLAD prox to old  stent - Synergy DES 3.0 x 12   TRANSTHORACIC ECHOCARDIOGRAM  10/11/2020    [facility]):  Normal LV size & function. EF 55-60%. NO RWMA. Normal filling parameters.  Mild TR however estimated moderate pulmonary hypertension with RVSP 52 mmHg.  IVC is mildly dilated.  No vegetations noted.  No significant change from last study.   TRANSTHORACIC ECHOCARDIOGRAM  05/2020   [facility]: 05/21/2020: Normal LV size and function.  Normal valves.  No effusion.; 06/04/2020: Normal LV size and function EF 60 to 65%.  Trace MR and TR.  No pulmonary hypertension.  Stable   UMBILICAL HERNIA REPAIR     UMBILICAL HERNIA REPAIR N/A 12/08/2022   Procedure: UMBILICAL HERNIA REPAIR WITH MESH;  Surgeon: Manus Rudd, MD;  Location: [facility];  Service: General;  Laterality: N/A;    Most Recent Cath -> (01/2017).  @ [facility] (Dr. Herbie Baltimore) CATH-PCI LAD: Prox RCA 55% ISR (FFR 0.86), 50% D1 @ bifurcation. Synergy DES 3.0 x 12 (for 99% mLAD lesion @ prox edge of old stent) - overlaps old stent with PTCA of old stent ISR>               MEDICATIONS/ALLERGIES   Current Meds  Medication Sig   acetaminophen (TYLENOL) 500 MG tablet Take 500 mg by mouth 3 (three) times daily as needed for moderate pain or headache.   Cholecalciferol (VITAMIN D3) 1000 units CAPS Take 1,000 Units by mouth at bedtime.   clopidogrel (PLAVIX) 75 MG tablet TAKE 1 TABLET(75 MG) BY MOUTH DAILY (Patient  taking differently: Take 75 mg by mouth in the morning.)   Cyanocobalamin 1000 MCG TBCR Take 1,000 mcg by mouth daily.   dexamethasone (DECADRON) 4 MG tablet Take 5 tabs (20 mg) weekly the day of daratumumab. Take with breakfast. (Patient taking differently: Take 20 mg by mouth See admin instructions. Take 20 mg with breakfast by mouth one hour prior to Daratumumab infusion on Mondays)   diltiazem (TIAZAC) 120 MG 24 hr capsule Take 1 capsule (120 mg total) by mouth daily.   diphenhydrAMINE (BENADRYL ALLERGY) 25 MG tablet Take 50 mg by mouth See admin instructions. Take 50 mg by mouth one hour prior to Daratumumab infusion on Mondays   Docusate Sodium (DSS) 100 MG CAPS Take by mouth daily.   famciclovir (FAMVIR) 250 MG tablet TAKE 1 TABLET BY MOUTH EVERY DAY   fluticasone (FLONASE) 50 MCG/ACT nasal spray Place 1 spray into both nostrils daily as needed for allergies or rhinitis.   lenalidomide (REVLIMID) 15 MG capsule Take 1 capsule (15 mg total) by mouth daily. Alexander Armstrong #[id number]    Date Obtained:  03/18/23   loratadine (CLARITIN) 10 MG tablet Take 10 mg by mouth in the morning.   MAGNESIUM PO Take by mouth daily.   montelukast (SINGULAIR) 10 MG tablet TAKE 1 TABLET BY MOUTH EVERYDAY AT BEDTIME   pantoprazole (PROTONIX) 40 MG tablet Take 1 tablet (40 mg total) by mouth daily. (Patient taking differently: Take 40 mg by mouth daily before breakfast.)   polyethylene glycol (MIRALAX / GLYCOLAX) 17 g packet Take 17 g by mouth daily.   REFRESH PLUS 0.5 % SOLN Place 1 drop into both eyes 3 (three) times daily as needed (for dryness).   rosuvastatin (CRESTOR) 20 MG tablet Take 1 tablet (20 mg total) by mouth daily.   tamsulosin (FLOMAX) 0.4 MG CAPS capsule Take 0.4 mg by mouth at  bedtime.    Allergies  Allergen Reactions   Latex Other (See Comments)    "takes the skin off" (tape)    SOCIAL HISTORY/FAMILY HISTORY   Reviewed in Epic:  Pertinent findings: Never smoker.  Does not drink.  Social  History   Social History Narrative   Married 63 years in 2023. 1 child Conne in [place].  1 grandchild in [place].       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 9 hours a day.       Hobbies: work, singing in church, some guitar (owns several guitars)    OBJCTIVE -PE, EKG, labs   Wt Readings from Last 3 Encounters:  03/24/23 190 lb (86.2 kg)  03/18/23 190 lb 1.3 oz (86.2 kg)  03/11/23 188 lb 12.8 oz (85.6 kg)    Physical Exam: BP (!) 96/52 (BP Location: Left Arm, Patient Position: Sitting, Cuff Size: Normal)   Pulse 82   Ht 5\' 10"  (1.778 m)   Wt 190 lb (86.2 kg)   SpO2 98%   BMI 27.26 kg/m  Physical Exam Vitals reviewed.  Constitutional:      General: He is not in acute distress.    Appearance: Normal appearance. He is normal weight. He is not ill-appearing (Healthy-appearing.  Well-nourished and well-groomed.) or toxic-appearing.  HENT:     Head: Normocephalic and atraumatic.  Neck:     Vascular: No carotid bruit.  Cardiovascular:     Rate and Rhythm: Normal rate and regular rhythm.     Pulses: Normal pulses.     Heart sounds: Normal heart sounds. No murmur heard.    No friction rub. No gallop.  Pulmonary:     Effort: Pulmonary effort is normal. No respiratory distress.     Breath sounds: Normal breath sounds. No wheezing, rhonchi or rales.  Chest:     Chest wall: No tenderness.  Musculoskeletal:        General: Swelling present. Normal range of motion.     Cervical back: Normal range of motion and neck supple.  Skin:    General: Skin is warm and dry.  Neurological:     General: No focal deficit present.     Mental Status: He is alert and oriented to person, place, and time.     Gait: Gait normal.  Psychiatric:        Mood and Affect: Mood normal.        Behavior: Behavior normal.        Thought Content: Thought content normal.        Judgment: Judgment normal.      Adult ECG Report Not checked  Recent Labs:  Reviewed Lab Results  Component Value Date   CHOL 142 12/23/2022   HDL 41.60 12/23/2022   LDLCALC 78 12/23/2022   TRIG 116.0 12/23/2022   CHOLHDL 3 12/23/2022   Lab Results  Component Value Date   CREATININE 1.16 03/18/2023   BUN 26 (H) 03/18/2023   NA 141 03/18/2023   K 4.3 03/18/2023   CL 107 03/18/2023   CO2 28 03/18/2023      Latest Ref Rng & Units 03/18/2023    9:27 AM 03/11/2023    1:17 PM 02/25/2023    8:40 AM  CBC  WBC 4.0 - 10.5 K/uL 5.0  6.3  6.6   Hemoglobin 13.0 - 17.0 g/dL 16.1  09.6  04.5   Hematocrit 39.0 - 52.0 % 32.6  33.3  33.9  Platelets 150 - 400 K/uL 185  178.0  114     Lab Results  Component Value Date   HGBA1C 7.1 (H) 12/23/2022   Lab Results  Component Value Date   TSH 2.44 04/05/2012    ================================================== I spent a total of 26 minutes with the patient spent in direct patient consultation.  Additional time spent with chart review  / charting (studies, outside notes, etc): 15 min Total Time: 41 min  Current medicines are reviewed at length with the patient today.  (+/- concerns) N/A  Notice: This dictation was prepared with Dragon dictation along with smart phrase technology. Any transcriptional errors that result from this process are unintentional and may not be corrected upon review.  Studies Ordered:   No orders of the defined types were placed in this encounter.    Meds ordered this encounter  Medications   diltiazem (TIAZAC) 120 MG 24 hr capsule    Sig: Take 1 capsule (120 mg total) by mouth daily.    Dispense:  90 capsule    Refill:  3    Patient Instructions / Medication Changes & Studies & Tests Ordered   Patient Instructions  Medication Instructions:    Start taking Diltiazem CD/XR 120 mg take daily  take either  at lunch or evening ,which every meal is your heaviest.  *If you need a refill on your cardiac medications before your next appointment, please call your pharmacy*   Lab  Work: Not needed   Testing/Procedures: Not needed   Follow-Up: At [facility], you and your health needs are our priority.  As part of our continuing mission to provide you with exceptional heart care, we have created designated Provider Care Teams.  These Care Teams include your primary Cardiologist (physician) and Advanced Practice Providers (APPs -  Physician Assistants and Nurse Practitioners) who all work together to provide you with the care you need, when you need it.     Your next appointment:   6 month(s)  The format for your next appointment:   In Person  Provider:   Bryan Lemma, MD      Marykay Lex, MD, MS Bryan Lemma, M.D., M.S. Interventional Cardiologist  [facility]  Pager # [phone] Phone # [phone] [address] [place], [place] [address]   Thank you for choosing [facility]!!

## 2023-03-24 NOTE — Patient Instructions (Addendum)
Medication Instructions:    Start taking Diltiazem CD/XR 120 mg take daily  take either  at lunch or evening ,which every meal is your heaviest.  *If you need a refill on your cardiac medications before your next appointment, please call your pharmacy*   Lab Work: Not needed   Testing/Procedures: Not needed   Follow-Up: At [facility], you and your health needs are our priority.  As part of our continuing mission to provide you with exceptional heart care, we have created designated Provider Care Teams.  These Care Teams include your primary Cardiologist (physician) and Advanced Practice Providers (APPs -  Physician Assistants and Nurse Practitioners) who all work together to provide you with the care you need, when you need it.     Your next appointment:   6 month(s)  The format for your next appointment:   In Person  Provider:   Bryan Lemma, MD

## 2023-03-25 ENCOUNTER — Encounter: Payer: Self-pay | Admitting: Cardiology

## 2023-03-25 ENCOUNTER — Other Ambulatory Visit: Payer: Self-pay

## 2023-03-25 ENCOUNTER — Inpatient Hospital Stay: Payer: Medicare Other

## 2023-03-25 ENCOUNTER — Encounter: Payer: Self-pay | Admitting: Physician Assistant

## 2023-03-26 LAB — UPEP/UIFE/LIGHT CHAINS/TP, 24-HR UR
% BETA, Urine: 44.7 %
ALPHA 1 URINE: 4.8 %
Albumin, U: 11.7 %
Alpha 2, Urine: 13.6 %
Free Kappa Lt Chains,Ur: 21.24 mg/L (ref 1.17–86.46)
Free Kappa/Lambda Ratio: 12.57 (ref 1.83–14.26)
Free Lambda Lt Chains,Ur: 1.69 mg/L (ref 0.27–15.21)
GAMMA GLOBULIN URINE: 25.2 %
Total Protein, Urine-Ur/day: 152 mg/24 hr — ABNORMAL HIGH (ref 30–150)
Total Protein, Urine: 7.6 mg/dL
Total Volume: 2000

## 2023-03-30 ENCOUNTER — Encounter: Payer: Self-pay | Admitting: Cardiology

## 2023-03-30 NOTE — Assessment & Plan Note (Signed)
Interestingly, he never really had angina despite having significant LAD lesion.  He had PCI to the LAD in 2018 and done well since then.  Completely asymptomatic.  Most recent Myoview was in July 2022 that was normal with normal echocardiogram.  This was to evaluate bursts of VT.  Plan: GDMT is limited by his borderline blood pressures.  Had not tolerated beta-blocker because of fatigue, or ARB due to hypotension. With palpitations recurring, we will restart low-dose diltiazem 120 mg daily. Continue rosuvastatin 25 daily.  Lipids been well-controlled. Continue maintenance Plavix-okay to hold for procedures or surgeries.  (5 to 7 days depending on procedure).

## 2023-03-30 NOTE — Assessment & Plan Note (Signed)
Thankfully, when he wore monitor last time he did not have any PET A-fib or SVT just short little ectopy runs.  As such, I think were probably okay go back to diltiazem 120 mg daily.  Adequate hydration.

## 2023-03-30 NOTE — Assessment & Plan Note (Signed)
LDL is up a little bit to 96-EAV previously been 66.  Will continue diltiazem 20 mg daily for now, but low threshold to titrate further.  I think this is probably having to do with his chemotherapy treatments.  I would be reluctant to titrate up statin while he is on Revlimid.  A1c 7.1 also probably thrown off by his dexamethasone etc.  He actually is not on any medications for diabetes-would consider SGLT2 inhibitor.

## 2023-03-30 NOTE — Assessment & Plan Note (Signed)
He still has a blood pressure in the 90s.  I think it would be okay with low-dose diltiazem as long as he takes it in the afternoon or at least after breakfast and lunch.

## 2023-03-30 NOTE — Assessment & Plan Note (Signed)
He has had some short bursts of fast heart rate spells, but not lasting all that long.  He had been doing well on diltiazem, but came off because of issues with blood pressure lipids illnesses.  Plan: Restart low-dose diltiazem 120 mg daily.  Episodes will not last long enough for her to take PRN short acting diltiazem.  Thankfully, ischemic evaluation was negative.

## 2023-04-01 ENCOUNTER — Encounter: Payer: Self-pay | Admitting: Hematology & Oncology

## 2023-04-01 ENCOUNTER — Inpatient Hospital Stay: Payer: Medicare Other

## 2023-04-01 ENCOUNTER — Ambulatory Visit (INDEPENDENT_AMBULATORY_CARE_PROVIDER_SITE_OTHER): Payer: Medicare Other | Admitting: Family Medicine

## 2023-04-01 ENCOUNTER — Encounter: Payer: Self-pay | Admitting: Family Medicine

## 2023-04-01 VITALS — BP 100/60 | HR 66 | Temp 97.6°F | Ht 70.0 in | Wt 194.4 lb

## 2023-04-01 VITALS — BP 109/52 | HR 70 | Temp 98.4°F | Resp 18

## 2023-04-01 DIAGNOSIS — E785 Hyperlipidemia, unspecified: Secondary | ICD-10-CM | POA: Diagnosis not present

## 2023-04-01 DIAGNOSIS — E1169 Type 2 diabetes mellitus with other specified complication: Secondary | ICD-10-CM | POA: Diagnosis not present

## 2023-04-01 DIAGNOSIS — E119 Type 2 diabetes mellitus without complications: Secondary | ICD-10-CM

## 2023-04-01 DIAGNOSIS — I2510 Atherosclerotic heart disease of native coronary artery without angina pectoris: Secondary | ICD-10-CM | POA: Diagnosis not present

## 2023-04-01 DIAGNOSIS — C9000 Multiple myeloma not having achieved remission: Secondary | ICD-10-CM

## 2023-04-01 DIAGNOSIS — Z5112 Encounter for antineoplastic immunotherapy: Secondary | ICD-10-CM | POA: Diagnosis not present

## 2023-04-01 LAB — CMP (CANCER CENTER ONLY)
ALT: 12 U/L (ref 0–44)
AST: 14 U/L — ABNORMAL LOW (ref 15–41)
Albumin: 3.8 g/dL (ref 3.5–5.0)
Alkaline Phosphatase: 66 U/L (ref 38–126)
Anion gap: 7 (ref 5–15)
BUN: 26 mg/dL — ABNORMAL HIGH (ref 8–23)
CO2: 26 mmol/L (ref 22–32)
Calcium: 9 mg/dL (ref 8.9–10.3)
Chloride: 108 mmol/L (ref 98–111)
Creatinine: 1.09 mg/dL (ref 0.61–1.24)
GFR, Estimated: >60 mL/min (ref >60)
Glucose, Bld: 203 mg/dL — ABNORMAL HIGH (ref 70–99)
Potassium: 3.8 mmol/L (ref 3.5–5.1)
Sodium: 141 mmol/L (ref 135–145)
Total Bilirubin: 0.6 mg/dL (ref 0.3–1.2)
Total Protein: 5.2 g/dL — ABNORMAL LOW (ref 6.5–8.1)

## 2023-04-01 LAB — CBC WITH DIFFERENTIAL (CANCER CENTER ONLY)
Abs Immature Granulocytes: 0.03 10*3/uL (ref 0.00–0.07)
Basophils Absolute: 0 10*3/uL (ref 0.0–0.1)
Basophils Relative: 1 %
Eosinophils Absolute: 1 10*3/uL — ABNORMAL HIGH (ref 0.0–0.5)
Eosinophils Relative: 12 %
HCT: 31.1 % — ABNORMAL LOW (ref 39.0–52.0)
Hemoglobin: 10.4 g/dL — ABNORMAL LOW (ref 13.0–17.0)
Immature Granulocytes: 0 %
Lymphocytes Relative: 16 %
Lymphs Abs: 1.4 10*3/uL (ref 0.7–4.0)
MCH: 33.1 pg (ref 26.0–34.0)
MCHC: 33.4 g/dL (ref 30.0–36.0)
MCV: 99 fL (ref 80.0–100.0)
Monocytes Absolute: 0.4 10*3/uL (ref 0.1–1.0)
Monocytes Relative: 4 %
Neutro Abs: 5.8 10*3/uL (ref 1.7–7.7)
Neutrophils Relative %: 67 %
Platelet Count: 101 10*3/uL — ABNORMAL LOW (ref 150–400)
RBC: 3.14 MIL/uL — ABNORMAL LOW (ref 4.22–5.81)
RDW: 15.5 % (ref 11.5–15.5)
WBC Count: 8.6 10*3/uL (ref 4.0–10.5)
nRBC: 0 % (ref 0.0–0.2)

## 2023-04-01 LAB — POCT GLYCOSYLATED HEMOGLOBIN (HGB A1C): Hemoglobin A1C: 6.3 % — AB (ref 4.0–5.6)

## 2023-04-01 MED ORDER — DOXYCYCLINE HYCLATE 100 MG PO TABS: 100.0000 mg | ORAL_TABLET | Freq: Two times a day (BID) | ORAL | 0 refills | Status: AC

## 2023-04-01 MED ORDER — BORTEZOMIB CHEMO SQ INJECTION 3.5 MG (2.5MG/ML)
1.3000 mg/m2 | Freq: Once | INTRAMUSCULAR | Status: AC
  Administered 2023-04-01: 2.75 mg via SUBCUTANEOUS
  Filled 2023-04-01: qty 1.1

## 2023-04-01 MED ORDER — TAMSULOSIN HCL 0.4 MG PO CAPS: 0.4000 mg | ORAL_CAPSULE | Freq: Every day | ORAL | 3 refills | Status: DC

## 2023-04-01 NOTE — Addendum Note (Signed)
Addended by: Gwenette Greet on: 04/01/2023 01:41 PM   Modules accepted: Orders

## 2023-04-01 NOTE — Progress Notes (Signed)
Phone [phone] In person visit   Subjective:   Alexander Armstrong is a 82 y.o. year old very pleasant male patient who presents for/with See problem oriented charting Chief Complaint  Patient presents with   Medical Management of Chronic Issues   Diabetes   Rash    Pt states rash had almost gone away but its now back, it does not itch nor painful   Past Medical History-  Patient Active Problem List   Diagnosis Date Noted   Partial small bowel obstruction (HCC) 09/21/2022    Priority: High   Multiple myeloma (HCC) 08/13/2022    Priority: High   Wide-complex tachycardia - WCT (HCC) 12/09/2016    Priority: High   Cardiac syncope 03/10/2016    Priority: High   Cardiac device in situ 11/18/2015    Priority: High   Type 2 diabetes mellitus without complication, without long-term current use of insulin (HCC) 11/18/2015    Priority: High   Paroxysmal VT (HCC) 09/17/2015    Priority: High   History of acute anterior wall MI 01/26/2008    Priority: High   Coronary artery disease involving native coronary artery of native heart without angina pectoris 11/03/2004    Priority: High   Vitamin D deficiency 12/23/2022    Priority: Medium    Hypotension 02/22/2018    Priority: Medium    BPH associated with nocturia 11/18/2015    Priority: Medium    Gastroesophageal reflux disease without esophagitis 11/18/2015    Priority: Medium    Hyperlipidemia associated with type 2 diabetes mellitus (HCC) 01/26/2008    Priority: Medium    ESOPHAGEAL STRICTURE 12/26/2007    Priority: Medium    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    Priority: Medium    Exercise intolerance 02/20/2019    Priority: Low   Keratoconjunctivitis sicca of both eyes not specified as Sjogren's 10/08/2017    Priority: Low   Urge incontinence 12/09/2016    Priority: Low   Palpitations 11/18/2015    Priority: Low   PVD (posterior vitreous detachment), both eyes 11/18/2015    Priority: Low   Umbilical hernia 11/18/2015     Priority: Low   Personal history of other malignant neoplasm of skin 07/20/2011    Priority: Low   HIATAL HERNIA 12/26/2007    Priority: Low   DIVERTICULOSIS, COLON 12/26/2007    Priority: Low   Family history of glaucoma 10/08/2017    Priority: 1.   Other acquired hammer toe 05/27/2017    Priority: 1.   Acute right-sided low back pain without sciatica 11/18/2015    Priority: 1.   Ingrown nail 11/18/2015    Priority: 1.   MRSA bacteremia 11/05/2020   Pneumonia of both lower lobes due to methicillin resistant Staphylococcus aureus (MRSA) (HCC) 11/05/2020   Chronic respiratory insufficiency 10/16/2020   Generalized weakness 05/17/2020   Hyponatremia with decreased serum osmolality 05/17/2020   Thrombocytopenia (HCC) 05/17/2020   Acquired deformity of toe 05/27/2017   Reflux gastritis 12/26/2007    Medications- reviewed and updated Current Outpatient Medications  Medication Sig Dispense Refill   acetaminophen (TYLENOL) 500 MG tablet Take 500 mg by mouth 3 (three) times daily as needed for moderate pain or headache.     Cholecalciferol (VITAMIN D3) 1000 units CAPS Take 1,000 Units by mouth at bedtime.     clopidogrel (PLAVIX) 75 MG tablet TAKE 1 TABLET(75 MG) BY MOUTH DAILY (Patient taking differently: Take 75 mg by mouth in the morning.) 90 tablet 2   Cyanocobalamin 1000 MCG  TBCR Take 1,000 mcg by mouth daily.     dexamethasone (DECADRON) 4 MG tablet Take 5 tabs (20 mg) weekly the day of daratumumab. Take with breakfast. (Patient taking differently: Take 20 mg by mouth See admin instructions. Take 20 mg with breakfast by mouth one hour prior to Daratumumab infusion on Mondays) 60 tablet 5   diltiazem (TIAZAC) 120 MG 24 hr capsule Take 1 capsule (120 mg total) by mouth daily. 90 capsule 3   diphenhydrAMINE (BENADRYL ALLERGY) 25 MG tablet Take 50 mg by mouth See admin instructions. Take 50 mg by mouth one hour prior to Daratumumab infusion on Mondays     Docusate Sodium (DSS) 100 MG  CAPS Take by mouth daily.     doxycycline (VIBRA-TABS) 100 MG tablet Take 1 tablet (100 mg total) by mouth 2 (two) times daily for 10 days. 20 tablet 0   famciclovir (FAMVIR) 250 MG tablet TAKE 1 TABLET BY MOUTH EVERY DAY 90 tablet 2   fluticasone (FLONASE) 50 MCG/ACT nasal spray Place 1 spray into both nostrils daily as needed for allergies or rhinitis.     lenalidomide (REVLIMID) 15 MG capsule Take 1 capsule (15 mg total) by mouth daily. [name] #[id number]    Date Obtained:  03/18/23 21 capsule 0   loratadine (CLARITIN) 10 MG tablet Take 10 mg by mouth in the morning.     MAGNESIUM PO Take by mouth daily.     montelukast (SINGULAIR) 10 MG tablet TAKE 1 TABLET BY MOUTH EVERYDAY AT BEDTIME 90 tablet 2   pantoprazole (PROTONIX) 40 MG tablet Take 1 tablet (40 mg total) by mouth daily. (Patient taking differently: Take 40 mg by mouth daily before breakfast.) 90 tablet 3   polyethylene glycol (MIRALAX / GLYCOLAX) 17 g packet Take 17 g by mouth daily.     REFRESH PLUS 0.5 % SOLN Place 1 drop into both eyes 3 (three) times daily as needed (for dryness).     rosuvastatin (CRESTOR) 20 MG tablet Take 1 tablet (20 mg total) by mouth daily. 90 tablet 3   tamsulosin (FLOMAX) 0.4 MG CAPS capsule Take 1 capsule (0.4 mg total) by mouth at bedtime. 90 capsule 3   No current facility-administered medications for this visit.     Objective:  BP 100/60   Pulse 66   Temp 97.6 F (36.4 C)   Ht 5\' 10"  (1.778 m)   Wt 194 lb 6.4 oz (88.2 kg)   SpO2 98%   BMI 27.89 kg/m  Gen: NAD, resting comfortably CV: RRR no murmurs rubs or gallops Lungs: CTAB no crackles, wheeze, rhonchi Ext: trace edema Skin:  diffuse papular rash spares face, hands, soles of feet    Assessment and Plan   #Rash S: Patient was seen by Ila Mcgill, PA on 03/11/2023 with 2-day history of diffuse urticarial rash without mucosal involvement.  Vital signs were stable in no acute distress was noted.  History of engorged tick removal  about 72 hours previously.  No rash on palms or soles.  He was have some chills and feeling poorly so they opted to treat with doxycycline for 10 days to be on the safe side.  They opted out of Lyme and RMSF testing and joint discussion.  CBC and CMP were reassuring. -Of note patient on bortezomib infusions for multiple myelomaand he just received 1 today.  Also on Revlimid. He mentioned rash to Dr. Myna Hidalgo who did not think related to cancer treatments  Today he reports rash nearly went  away- was very light on antibiotics but seemed to recur afterwards. Multiple papules not pruritic but so many that they essentially coalesce. Not feeling ill like he did previously. He reports this rash started before he restarted revlimid. -tried to stop using bounce without relief    ROS-not ill appearing, no fever/chills. No new medications. is immunocompromised. No mucus membrane involvement.  A/P: Rash with unclear cause. Responsive heavily to doxycycline after tick bite but recurred/worsened when came off- he and wife would like to retry this- also considered prednisone but opted out due to potential to raise blood sugar and further immunodepress him -warned of risk for rash in sun  # Multiple myeloma-follows with Dr. Myna Hidalgo S: Patient with ongoing infusions through oncology as of January 2024.   A/P: overall stable as far as aware- has upcoming bone marrow biopsy on monday   #CAD-follows with Dr. Herbie Baltimore with history of MI and PCI #hyperlipidemia-LDL goal under 70 #Palpitations-as needed diltiazem 60 mg in past. In past also 120 mg extended release and restarted recently S: Medication:Plavix 75 mg, rosuvastatin 20 mg. No chest pain or shortness of breath  Lab Results  Component Value Date   CHOL 142 12/23/2022   HDL 41.60 12/23/2022   LDLCALC 78 12/23/2022   TRIG 116.0 12/23/2022   CHOLHDL 3 12/23/2022  A/P: CAD asymptomatic- continue current medications Lipids very close to  goal continue current  medications- declines increase for now- wants to work on dietary changes Palpitations- improved- continue current medications- blood pressure lowe normal   # Diabetes S: Medication: diet controlled Lab Results  Component Value Date   HGBA1C 7.1 (H) 12/23/2022   HGBA1C 7.7 (H) 06/24/2022   A/P: hopefully stable- update Point of Care (POC) a1c today. Continue current meds for now  -feels has space to tighten diet- has loosened recently  # GERD with history of Barrett's esophagus S:Medication:  pantoprazole 40 mg A/P: no recent issues- continue to monitor    #Vitamin D deficiency S: Medication: 1000 units daily Last vitamin D Lab Results  Component Value Date   VD25OH 32.13 12/23/2022  A/P: stable- continue current medicines    #Overactive bladder/BPH S: medication: tamsulosin 0.4 mg. Prior oxybutynin A/P: he has been out of tamsulosin and wants to restart- sent this in today. Be very careful with position changes/low blood pressure on this- we may have to stop again  -ongoing balance issues- no worse  Recommended follow up: Return in about 4 months (around 08/01/2023) for followup or sooner if needed.Schedule b4 you leave. Future Appointments  Date Time Provider Department Center  04/05/2023  7:30 AM [facility] ROOM [facility] None  04/05/2023  9:00 AM [facility] 1 [facility] [place]  04/15/2023  9:45 AM [facility] None  04/15/2023 10:00 AM Ennever, Rose Phi, MD [facility] None  04/15/2023 10:30 AM [facility] A3 [facility] None  04/22/2023  9:30 AM [facility] LAB [facility] None  04/22/2023 10:00 AM [facility] A1 [facility] None  05/13/2023  9:30 AM [facility] None  05/13/2023  9:45 AM Ennever, Rose Phi, MD [facility] None  05/13/2023 10:00 AM [facility] A1 [facility] None  05/20/2023  9:30 AM [facility] LAB [facility] None  05/20/2023 10:00 AM [facility] A1 [facility] None  09/10/2023 11:40 AM Marykay Lex, MD [facility] None    Lab/Order associations: No diagnosis found.  Meds ordered this encounter  Medications    doxycycline (VIBRA-TABS) 100 MG tablet    Sig: Take 1 tablet (100 mg total) by mouth 2 (two) times daily for  10 days.    Dispense:  20 tablet    Refill:  0   tamsulosin (FLOMAX) 0.4 MG CAPS capsule    Sig: Take 1 capsule (0.4 mg total) by mouth at bedtime.    Dispense:  90 capsule    Refill:  3    Return precautions advised.  Tana Conch, MD

## 2023-04-01 NOTE — Patient Instructions (Signed)
Bortezomib Injection What is this medication? BORTEZOMIB (bor TEZ oh mib) treats lymphoma. It may also be used to treat multiple myeloma, a type of bone marrow cancer. It works by blocking a protein that causes cancer cells to grow and multiply. This helps to slow or stop the spread of cancer cells. This medicine may be used for other purposes; ask your health care provider or pharmacist if you have questions. COMMON BRAND NAME(S): Velcade What should I tell my care team before I take this medication? They need to know if you have any of these conditions: Dehydration Diabetes Heart disease Liver disease Tingling of the fingers or toes or other nerve disorder An unusual or allergic reaction to bortezomib, other medications, foods, dyes, or preservatives If you or your partner are pregnant or trying to get pregnant Breastfeeding How should I use this medication? This medication is injected into a vein or under the skin. It is given by your care team in a hospital or clinic setting. Talk to your care team about the use of this medication in children. Special care may be needed. Overdosage: If you think you have taken too much of this medicine contact a poison control center or emergency room at once. NOTE: This medicine is only for you. Do not share this medicine with others. What if I miss a dose? Keep appointments for follow-up doses. It is important not to miss your dose. Call your care team if you are unable to keep an appointment. What may interact with this medication? Ketoconazole Rifampin This list may not describe all possible interactions. Give your health care provider a list of all the medicines, herbs, non-prescription drugs, or dietary supplements you use. Also tell them if you smoke, drink alcohol, or use illegal drugs. Some items may interact with your medicine. What should I watch for while using this medication? Your condition will be monitored carefully while you are  receiving this medication. You may need blood work while taking this medication. This medication may affect your coordination, reaction time, or judgment. Do not drive or operate machinery until you know how this medication affects you. Sit up or stand slowly to reduce the risk of dizzy or fainting spells. Drinking alcohol with this medication can increase the risk of these side effects. This medication may increase your risk of getting an infection. Call your care team for advice if you get a fever, chills, sore throat, or other symptoms of a cold or flu. Do not treat yourself. Try to avoid being around people who are sick. Check with your care team if you have severe diarrhea, nausea, and vomiting, or if you sweat a lot. The loss of too much body fluid may make it dangerous for you to take this medication. Talk to your care team if you may be pregnant. Serious birth defects can occur if you take this medication during pregnancy and for 7 months after the last dose. You will need a negative pregnancy test before starting this medication. Contraception is recommended while taking this medication and for 7 months after the last dose. Your care team can help you find the option that works for you. If your partner can get pregnant, use a condom during sex while taking this medication and for 4 months after the last dose. Do not breastfeed while taking this medication and for 2 months after the last dose. This medication may cause infertility. Talk to your care team if you are concerned about your fertility. What side effects   may I notice from receiving this medication? Side effects that you should report to your care team as soon as possible: Allergic reactions--skin rash, itching, hives, swelling of the face, lips, tongue, or throat Bleeding--bloody or black, tar-like stools, vomiting blood or brown material that looks like coffee grounds, red or dark brown urine, small red or purple spots on skin, unusual  bruising or bleeding Bleeding in the brain--severe headache, stiff neck, confusion, dizziness, change in vision, numbness or weakness of the face, arm, or leg, trouble speaking, trouble walking, vomiting Bowel blockage--stomach cramping, unable to have a bowel movement or pass gas, loss of appetite, vomiting Heart failure--shortness of breath, swelling of the ankles, feet, or hands, sudden weight gain, unusual weakness or fatigue Infection--fever, chills, cough, sore throat, wounds that don't heal, pain or trouble when passing urine, general feeling of discomfort or being unwell Liver injury--right upper belly pain, loss of appetite, nausea, light-colored stool, dark yellow or brown urine, yellowing skin or eyes, unusual weakness or fatigue Low blood pressure--dizziness, feeling faint or lightheaded, blurry vision Lung injury--shortness of breath or trouble breathing, cough, spitting up blood, chest pain, fever Pain, tingling, or numbness in the hands or feet Severe or prolonged diarrhea Stomach pain, bloody diarrhea, pale skin, unusual weakness or fatigue, decrease in the amount of urine, which may be signs of hemolytic uremic syndrome Sudden and severe headache, confusion, change in vision, seizures, which may be signs of posterior reversible encephalopathy syndrome (PRES) TTP--purple spots on the skin or inside the mouth, pale skin, yellowing skin or eyes, unusual weakness or fatigue, fever, fast or irregular heartbeat, confusion, change in vision, trouble speaking, trouble walking Tumor lysis syndrome (TLS)--nausea, vomiting, diarrhea, decrease in the amount of urine, dark urine, unusual weakness or fatigue, confusion, muscle pain or cramps, fast or irregular heartbeat, joint pain Side effects that usually do not require medical attention (report to your care team if they continue or are bothersome): Constipation Diarrhea Fatigue Loss of appetite Nausea This list may not describe all possible  side effects. Call your doctor for medical advice about side effects. You may report side effects to FDA at 1-800-FDA-1088. Where should I keep my medication? This medication is given in a hospital or clinic. It will not be stored at home. NOTE: This sheet is a summary. It may not cover all possible information. If you have questions about this medicine, talk to your doctor, pharmacist, or health care provider.  2024 [employer]vier/Gold Standard (2022-02-24 00:00:00)  

## 2023-04-01 NOTE — Patient Instructions (Addendum)
A1c before he goes- I will message about results  Rash with unclear cause. Responsive heavily to doxycycline after tick bite but recurred/worsened when came off- he and wife would like to retry this- also considered prednisone but opted out due to potential to raise blood sugar and further immunodepress him -warned of risk for rash in sun on doxycycline  Restart tamsulosin - but if you get lightheaded/dizzy with this can come back off- be very careful at least first 24 hours on it with blood pressure dropping with standing  Recommended follow up: Return in about 4 months (around 08/01/2023) for followup or sooner if needed.Schedule b4 you leave.

## 2023-04-02 ENCOUNTER — Other Ambulatory Visit: Payer: Self-pay | Admitting: Physician Assistant

## 2023-04-02 DIAGNOSIS — Z01818 Encounter for other preprocedural examination: Secondary | ICD-10-CM

## 2023-04-04 NOTE — H&P (Signed)
Referring Physician(s): Ennever,Peter R  Supervising Physician: Malachy Moan  Patient Status:  [facility]  Chief Complaint:  'I'm getting a bone marrow biopsy"  Subjective: Pt known to IR team from bone marrow bx on 08/04/22. 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 He is scheduled today for f/u CT guided BM bx to assess treatment response/rule out underlying myelodysplasia. Additional med hx as below.  He denies fever,HA,CP,dyspnea, cough, abd pain,N/V or bleeding. 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 Ant Stemi (11/03/04) mLAD - PCI: Taxus DES 3.0 x 20; b. (11/06/04) staged RCA DES PCI: Taxus DES 3.5 x 16; c. (01/2017) High Risk Myoview (~fixed anterior defect with normal WM) @ [facility] (Dr. Herbie Baltimore) Synergy DES 3.0 x 12 (for 99% lesion @ prox edge of old stent   Cardiac syncope    No episode since starting flecainide. Had documented WIDE COMPLEX Tachycardia on loop recorder.   Cataract bilateral cataracts   Diverticulosis    Esophageal stricture    GERD (gastroesophageal reflux disease)    Hiatal hernia 11/05/2010   HLD (hyperlipidemia)    Implantable loop recorder present    Multiple myeloma (HCC) 08/13/2022   Nonsustained paroxysmal ventricular tachycardia (HCC) 2014   Initially controlled with flecainide. Unable to induce during EP study.;  Due to CAD, flecainide discontinued -> intolerant of both mexiletine and amiodarone -> Dr. Elberta Fortis (EP-Cards) d/c'd mexilitine & statrted low dose Diltizem XT; Holter Monitor 12/2018 - HR 47-114, rate PACs/PVCs, no Afib/flutter or SVT, VT.    Pneumonia    2022   Reflux gastritis    STEMI involving left anterior descending coronary artery (HCC) 11/03/2004   [facility]:  Occluded LAD treated with DES. Staged PCI to the RCA.   Past Surgical History:  Procedure Laterality Date   CARDIAC CATHETERIZATION  ~2011   [facility]: re-look Cath - patent stents   CORONARY PRESSURE/FFR STUDY N/A 01/27/2017   Procedure: Intravascular Pressure Wire/FFR Study;  Surgeon: Marykay Lex, MD;  Location: [facility] INVASIVE CV LAB;  Service: Cardiovascular;  Laterality: N/A;   CORONARY STENT INTERVENTION N/A 01/27/2017   Procedure: Coronary Stent Intervention;  Surgeon: Marykay Lex, MD;  Location: [facility] INVASIVE CV LAB: mLAD (just after D1) overlapping prior DES -> SYNERGY DES 3X12 drug eluting stent   ELECTROPHYSIOLOGIC STUDY  2014   Unable to stimulate VT seen on loop recorder   HOLTER MONITOR  12/2018    (Off of mexiletine, only on diltiazem) heart rate ranged from 47 to 114 bpm.  Rare PACs and PVCs.  Sinus rhythm noted no A. fib or other arrhythmia.   implanted loop heart monitor x2  ~2011; 2014   Per report- batter out of date; apparently captured a prolonged episode of wide complex tachycardia associated with an episode of weakness/near syncope   LEFT HEART CATH AND CORONARY ANGIOGRAPHY N/A 01/27/2017   Procedure: Left Heart Cath and Coronary Angiography;  Surgeon: Marykay Lex, MD;  Location: [facility] INVASIVE CV LAB: mLAD 99% stenosis pre-Stent & ISR --> PCI. Ost DI ~50%. pRCA DES ~50-60% FFR 0.86.   NM MYOVIEW LTD  07/2014   Unremarkable EKG. No evidence of ischemia or infarct. Mild  anterior attenuation, cannot exclude prior infarct, but nonreversible. EF 66%.   NM MYOVIEW LTD  12/22/2016   INTERMEDIATE RISK. EF 58%. Defect 1: Medium size severe defect in the mid anterior, apical anterior and apical wall consistent with prior anterior MI. Defect to: Large defect and moderate severity in the basal inferoseptal basal inferior and inferoseptal mid inferior wall consistent with ischemia.   NM MYOVIEW LTD  04/05/2019   EF 55 to 65%.  Small size, mild severity fixed perfusion defect in  the mid-apical anterior septal wall.  Likely represents old anterior infarct.   No evidence of ischemia. LOW RISK.    PERCUTANEOUS CORONARY STENT INTERVENTION (PCI-S)  January-February 2006   a. mLAD - Taxus DES 3.0 x 20; b. (11/06/04) staged PCI to RCA Taxus DES 3.5 x 16; c. 01/27/2017: PCI to mLAD prox to old  stent - Synergy DES 3.0 x 12   TRANSTHORACIC ECHOCARDIOGRAM  10/11/2020    [facility]):  Normal LV size & function. EF 55-60%. NO RWMA. Normal filling parameters.  Mild TR however estimated moderate pulmonary hypertension with RVSP 52 mmHg.  IVC is mildly dilated.  No vegetations noted.  No significant change from last study.   TRANSTHORACIC ECHOCARDIOGRAM  05/2020   [facility]: 05/21/2020: Normal LV size and function.  Normal valves.  No effusion.; 06/04/2020: Normal LV size and function EF 60 to 65%.  Trace MR and TR.  No pulmonary hypertension.  Stable   UMBILICAL HERNIA REPAIR     UMBILICAL HERNIA REPAIR N/A 12/08/2022   Procedure: UMBILICAL HERNIA REPAIR WITH MESH;  Surgeon: Manus Rudd, MD;  Location:  SURGERY CENTER;  Service: General;  Laterality: N/A;      Allergies: Latex  Medications: Prior to Admission medications   Medication Sig Start Date End Date Taking? Authorizing Provider  acetaminophen (TYLENOL) 500 MG tablet Take 500 mg by mouth 3 (three) times daily as needed for moderate pain or headache.    [provider]  Cholecalciferol (VITAMIN D3) 1000 units CAPS Take 1,000 Units by mouth at bedtime.    [provider]  clopidogrel (PLAVIX) 75 MG tablet TAKE 1 TABLET(75 MG) BY MOUTH DAILY Patient taking differently: Take 75 mg by mouth in the morning. 11/02/19   Marykay Lex, MD  Cyanocobalamin 1000 MCG TBCR Take 1,000 mcg by mouth daily. 01/13/22   [provider]  dexamethasone (DECADRON) 4 MG tablet Take 5 tabs (20 mg) weekly the day of daratumumab. Take with breakfast. Patient taking differently: Take 20 mg by mouth See  admin instructions. Take 20 mg with breakfast by mouth one hour prior to Daratumumab infusion on Mondays 09/09/22   Josph Macho, MD  diltiazem [facility]) 120 MG 24 hr capsule Take 1 capsule (120 mg total) by mouth daily. 03/24/23   Marykay Lex, MD  diphenhydrAMINE (BENADRYL ALLERGY) 25 MG tablet Take 50 mg by mouth See admin instructions. Take 50 mg by mouth one hour prior to Daratumumab infusion on Mondays    [provider]  Docusate Sodium (DSS) 100 MG CAPS Take by mouth daily. 09/23/22   [provider]  doxycycline (VIBRA-TABS) 100 MG tablet Take 1 tablet (100 mg total) by mouth 2 (two) times daily for 10 days. 04/01/23 04/11/23  Shelva Majestic, MD  famciclovir (FAMVIR) 250 MG tablet TAKE 1 TABLET BY MOUTH EVERY DAY 02/25/23   Josph Macho, MD  fluticasone (FLONASE) 50 MCG/ACT nasal spray Place 1 spray into both nostrils daily as  needed for allergies or rhinitis.    [provider]  lenalidomide (REVLIMID) 15 MG capsule Take 1 capsule (15 mg total) by mouth daily. [name] #[id number]    Date Obtained:  03/18/23 03/18/23   Josph Macho, MD  loratadine (CLARITIN) 10 MG tablet Take 10 mg by mouth in the morning.    [provider]  MAGNESIUM PO Take by mouth daily.    [provider]  montelukast (SINGULAIR) 10 MG tablet TAKE 1 TABLET BY MOUTH EVERYDAY AT BEDTIME 02/25/23   Josph Macho, MD  pantoprazole (PROTONIX) 40 MG tablet Take 1 tablet (40 mg total) by mouth daily. Patient taking differently: Take 40 mg by mouth daily before breakfast. 06/24/22   Shelva Majestic, MD  polyethylene glycol (MIRALAX / GLYCOLAX) 17 g packet Take 17 g by mouth daily.    [provider]  REFRESH PLUS 0.5 % SOLN Place 1 drop into both eyes 3 (three) times daily as needed (for dryness).    [provider]  rosuvastatin (CRESTOR) 20 MG tablet Take 1 tablet (20 mg total) by mouth daily. 06/24/22   Shelva Majestic, MD  tamsulosin (FLOMAX)  0.4 MG CAPS capsule Take 1 capsule (0.4 mg total) by mouth at bedtime. 04/01/23   Shelva Majestic, MD     Vital Signs: Vitals:   04/05/23 0745  BP: 128/79  Pulse: 74  Resp: 18  Temp: 97.8 F (36.6 C)  SpO2: 96%      Code Status: FULL CODE  Physical Exam: awake/alert; chest- CTA bilat; heart- RRR;  abd- soft,+BS,NT; no sig LE edema; diffuse erythematous macular skin rash; loop recorders left chest region  Imaging: No results found.  Labs:  CBC: Recent Labs    02/25/23 0840 03/11/23 1317 03/18/23 0927 04/01/23 0918  WBC 6.6 6.3 5.0 8.6  HGB 11.1* 11.2* 11.0* 10.4*  HCT 33.9* 33.3* 32.6* 31.1*  PLT 114* 178.0 185 101*    COAGS: No results for input(s): "INR", "APTT" in the last 8760 hours.  BMP: Recent Labs    02/18/23 1021 02/25/23 0840 03/11/23 1317 03/18/23 0927 04/01/23 0918  NA 140 138 138 141 141  K 4.2 4.2 4.2 4.3 3.8  CL 104 106 105 107 108  CO2 28 27 26 28 26   GLUCOSE 127* 197* 102* 145* 203*  BUN 19 25* 23 26* 26*  CALCIUM 9.5 9.0 8.8 9.8 9.0  CREATININE 1.08 1.16 1.14 1.16 1.09  GFRNONAA >60 >60  --  >60 >60    LIVER FUNCTION TESTS: Recent Labs    02/25/23 0840 03/11/23 1317 03/18/23 0927 04/01/23 0918  BILITOT 0.5 0.6 0.5 0.6  AST 17 18 16  14*  ALT 14 14 18 12   ALKPHOS 67 70 75 66  PROT 5.7* 6.0 6.1* 5.2*  ALBUMIN 4.0 3.8 4.2 3.8    Assessment and Plan: 82 yo male with PMH sig for  arthritis, skin cancer, CAD with prior MI/stenting, diverticulosis, GERD, HLD, loop recorder, anemia and 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 He is scheduled today for f/u CT guided BM bx to assess treatment response/rule out underlying myelodysplasia.Risks and benefits of procedure was discussed with the patient  including, but not limited to bleeding, infection, damage to adjacent structures or low yield requiring additional tests.  All of the questions were answered and there is agreement to proceed.  Consent signed and in  chart.    Electronically Signed: D. Jeananne Rama, PA-C 04/04/2023, 5:14 PM  I spent a total of 20 minutes at the the patient's bedside AND on the patient's hospital floor or unit, greater than 50% of which was counseling/coordinating care for CT guided bone marrow biopsy

## 2023-04-05 ENCOUNTER — Encounter (HOSPITAL_COMMUNITY): Payer: Self-pay

## 2023-04-05 ENCOUNTER — Other Ambulatory Visit: Payer: Self-pay

## 2023-04-05 ENCOUNTER — Ambulatory Visit (HOSPITAL_COMMUNITY)
Admission: RE | Admit: 2023-04-05 | Discharge: 2023-04-05 | Disposition: A | Discharge status: Home / Self Care | Payer: Medicare Other | Source: Ambulatory Visit | Attending: Hematology & Oncology | Admitting: Hematology & Oncology

## 2023-04-05 DIAGNOSIS — Z01818 Encounter for other preprocedural examination: Secondary | ICD-10-CM

## 2023-04-05 DIAGNOSIS — D649 Anemia, unspecified: Secondary | ICD-10-CM | POA: Diagnosis not present

## 2023-04-05 DIAGNOSIS — C9000 Multiple myeloma not having achieved remission: Secondary | ICD-10-CM | POA: Diagnosis not present

## 2023-04-05 LAB — GLUCOSE, CAPILLARY: Glucose-Capillary: 132 mg/dL — ABNORMAL HIGH (ref 70–99)

## 2023-04-05 LAB — CBC WITH DIFFERENTIAL/PLATELET
Abs Immature Granulocytes: 0.04 10*3/uL (ref 0.00–0.07)
Basophils Absolute: 0.1 10*3/uL (ref 0.0–0.1)
Basophils Relative: 1 %
Eosinophils Absolute: 1 10*3/uL — ABNORMAL HIGH (ref 0.0–0.5)
Eosinophils Relative: 12 %
HCT: 31.1 % — ABNORMAL LOW (ref 39.0–52.0)
Hemoglobin: 10.5 g/dL — ABNORMAL LOW (ref 13.0–17.0)
Immature Granulocytes: 1 %
Lymphocytes Relative: 19 %
Lymphs Abs: 1.6 10*3/uL (ref 0.7–4.0)
MCH: 33.7 pg (ref 26.0–34.0)
MCHC: 33.8 g/dL (ref 30.0–36.0)
MCV: 99.7 fL (ref 80.0–100.0)
Monocytes Absolute: 0.9 10*3/uL (ref 0.1–1.0)
Monocytes Relative: 11 %
Neutro Abs: 4.7 10*3/uL (ref 1.7–7.7)
Neutrophils Relative %: 56 %
Platelets: 83 10*3/uL — ABNORMAL LOW (ref 150–400)
RBC: 3.12 MIL/uL — ABNORMAL LOW (ref 4.22–5.81)
RDW: 15.2 % (ref 11.5–15.5)
WBC: 8.3 10*3/uL (ref 4.0–10.5)
nRBC: 0 % (ref 0.0–0.2)

## 2023-04-05 MED ORDER — MIDAZOLAM HCL 2 MG/2ML IJ SOLN
INTRAMUSCULAR | Status: AC
  Filled 2023-04-05: qty 4

## 2023-04-05 MED ORDER — MIDAZOLAM HCL 2 MG/2ML IJ SOLN
INTRAMUSCULAR | Status: AC | PRN
  Administered 2023-04-05 (×2): 1 mg via INTRAVENOUS

## 2023-04-05 MED ORDER — FENTANYL CITRATE (PF) 100 MCG/2ML IJ SOLN
INTRAMUSCULAR | Status: AC
  Filled 2023-04-05: qty 4

## 2023-04-05 MED ORDER — NALOXONE HCL 0.4 MG/ML IJ SOLN
INTRAMUSCULAR | Status: AC
  Filled 2023-04-05: qty 1

## 2023-04-05 MED ORDER — LIDOCAINE HCL (PF) 1 % IJ SOLN
INTRAMUSCULAR | Status: AC | PRN
  Administered 2023-04-05: 10 mL

## 2023-04-05 MED ORDER — SODIUM CHLORIDE 0.9 % IV SOLN: INTRAVENOUS | Status: DC

## 2023-04-05 MED ORDER — FENTANYL CITRATE (PF) 100 MCG/2ML IJ SOLN
INTRAMUSCULAR | Status: AC | PRN
  Administered 2023-04-05 (×2): 50 ug via INTRAVENOUS

## 2023-04-05 MED ORDER — FLUMAZENIL 0.5 MG/5ML IV SOLN
INTRAVENOUS | Status: AC
  Filled 2023-04-05: qty 5

## 2023-04-05 NOTE — Discharge Instructions (Signed)
Please call Interventional Radiology clinic [phone] with any questions or concerns. ? ?You may remove your dressing and shower tomorrow. ? ? ?Bone Marrow Aspiration and Bone Marrow Biopsy, Adult, Care After ?This sheet gives you information about how to care for yourself after your procedure. Your health care provider may also give you more specific instructions. If you have problems or questions, contact your health care provider. ?What can I expect after the procedure? ?After the procedure, it is common to have: ?Mild pain and tenderness. ?Swelling. ?Bruising. ?Follow these instructions at home: ?Puncture site care ?Follow instructions from your health care provider about how to take care of the puncture site. Make sure you: ?Wash your hands with soap and water before and after you change your bandage (dressing). If soap and water are not available, use hand sanitizer. ?Change your dressing as told by your health care provider. ?Check your puncture site every day for signs of infection. Check for: ?More redness, swelling, or pain. ?Fluid or blood. ?Warmth. ?Pus or a bad smell.   ?Activity ?Return to your normal activities as told by your health care provider. Ask your health care provider what activities are safe for you. ?Do not lift anything that is heavier than 10 lb (4.5 kg), or the limit that you are told, until your health care provider says that it is safe. ?Do not drive for 24 hours if you were given a sedative during your procedure. ?General instructions ?Take over-the-counter and prescription medicines only as told by your health care provider. ?Do not take baths, swim, or use a hot tub until your health care provider approves. Ask your health care provider if you may take showers. You may only be allowed to take sponge baths. ?If directed, put ice on the affected area. To do this: ?Put ice in a plastic bag. ?Place a towel between your skin and the bag. ?Leave the ice on for 20 minutes, 2-3 times a  day. ?Keep all follow-up visits as told by your health care provider. This is important.   ?Contact a health care provider if: ?Your pain is not controlled with medicine. ?You have a fever. ?You have more redness, swelling, or pain around the puncture site. ?You have fluid or blood coming from the puncture site. ?Your puncture site feels warm to the touch. ?You have pus or a bad smell coming from the puncture site. ?Summary ?After the procedure, it is common to have mild pain, tenderness, swelling, and bruising. ?Follow instructions from your health care provider about how to take care of the puncture site and what activities are safe for you. ?Take over-the-counter and prescription medicines only as told by your health care provider. ?Contact a health care provider if you have any signs of infection, such as fluid or blood coming from the puncture site. ?This information is not intended to replace advice given to you by your health care provider. Make sure you discuss any questions you have with your health care provider. ?Document Revised: 02/07/2019 Document Reviewed: 02/07/2019 ?Elsevier Patient Education ? 2021 Elsevier Inc. ? ? ?Moderate Conscious Sedation, Adult, Care After ?This sheet gives you information about how to care for yourself after your procedure. Your health care provider may also give you more specific instructions. If you have problems or questions, contact your health care provider. ?What can I expect after the procedure? ?After the procedure, it is common to have: ?Sleepiness for several hours. ?Impaired judgment for several hours. ?Difficulty with balance. ?Vomiting if you eat too   soon. ?Follow these instructions at home: ?For the time period you were told by your health care provider: ?Rest. ?Do not participate in activities where you could fall or become injured. ?Do not drive or use machinery. ?Do not drink alcohol. ?Do not take sleeping pills or medicines that cause drowsiness. ?Do not  make important decisions or sign legal documents. ?Do not take care of children on your own.  ?  ?  ?Eating and drinking ?Follow the diet recommended by your health care provider. ?Drink enough fluid to keep your urine pale yellow. ?If you vomit: ?Drink water, juice, or soup when you can drink without vomiting. ?Make sure you have little or no nausea before eating solid foods.   ?General instructions ?Take over-the-counter and prescription medicines only as told by your health care provider. ?Have a responsible adult stay with you for the time you are told. It is important to have someone help care for you until you are awake and alert. ?Do not smoke. ?Keep all follow-up visits as told by your health care provider. This is important. ?Contact a health care provider if: ?You are still sleepy or having trouble with balance after 24 hours. ?You feel light-headed. ?You keep feeling nauseous or you keep vomiting. ?You develop a rash. ?You have a fever. ?You have redness or swelling around the IV site. ?Get help right away if: ?You have trouble breathing. ?You have new-onset confusion at home. ?Summary ?After the procedure, it is common to feel sleepy, have impaired judgment, or feel nauseous if you eat too soon. ?Rest after you get home. Know the things you should not do after the procedure. ?Follow the diet recommended by your health care provider and drink enough fluid to keep your urine pale yellow. ?Get help right away if you have trouble breathing or new-onset confusion at home. ?This information is not intended to replace advice given to you by your health care provider. Make sure you discuss any questions you have with your health care provider. ?Document Revised: 01/19/2020 Document Reviewed: 08/17/2019 ?Elsevier Patient Education ? 2021 Elsevier Inc.  ?

## 2023-04-05 NOTE — Procedures (Signed)
Interventional Radiology Procedure Note  Procedure: CT guided aspirate and core biopsy of right iliac bone Complications: None Recommendations: - Bedrest supine x 1 hrs - Hydrocodone PRN  Pain - Follow biopsy results  Signed,  Meliya Mcconahy K. Harrietta Incorvaia, MD   

## 2023-04-05 NOTE — Sedation Documentation (Signed)
Sample #1 obtained 

## 2023-04-05 NOTE — Sedation Documentation (Signed)
Sample #3 obtained 

## 2023-04-05 NOTE — Sedation Documentation (Signed)
Sample #2 obtained 

## 2023-04-07 ENCOUNTER — Other Ambulatory Visit: Payer: Self-pay | Admitting: *Deleted

## 2023-04-07 ENCOUNTER — Telehealth: Payer: Self-pay

## 2023-04-07 LAB — SURGICAL PATHOLOGY

## 2023-04-07 MED ORDER — LENALIDOMIDE 15 MG PO CAPS: 15.0000 mg | ORAL_CAPSULE | Freq: Every day | ORAL | 0 refills | Status: DC

## 2023-04-07 NOTE — Telephone Encounter (Signed)
-----   Message from Josph Macho, MD sent at 04/07/2023  2:08 PM EDT ----- Please call and let him know that the bone marrow actually is normal now.  He only has 3% plasma cells.  This is fantastic.  Great job.  Cindee Lame

## 2023-04-07 NOTE — Telephone Encounter (Signed)
Advised via MyChart.

## 2023-04-09 ENCOUNTER — Encounter (HOSPITAL_COMMUNITY): Payer: Self-pay | Admitting: Hematology & Oncology

## 2023-04-15 ENCOUNTER — Inpatient Hospital Stay: Payer: Medicare Other | Attending: Hematology & Oncology | Admitting: Hematology & Oncology

## 2023-04-15 ENCOUNTER — Encounter: Payer: Self-pay | Admitting: Hematology & Oncology

## 2023-04-15 ENCOUNTER — Inpatient Hospital Stay: Payer: Medicare Other

## 2023-04-15 VITALS — BP 104/58 | HR 62 | Temp 98.0°F | Resp 20 | Ht 70.0 in | Wt 193.4 lb

## 2023-04-15 DIAGNOSIS — C9000 Multiple myeloma not having achieved remission: Secondary | ICD-10-CM

## 2023-04-15 DIAGNOSIS — D630 Anemia in neoplastic disease: Secondary | ICD-10-CM | POA: Insufficient documentation

## 2023-04-15 DIAGNOSIS — Z5112 Encounter for antineoplastic immunotherapy: Secondary | ICD-10-CM | POA: Diagnosis present

## 2023-04-15 LAB — CMP (CANCER CENTER ONLY)
ALT: 17 U/L (ref 0–44)
AST: 15 U/L (ref 15–41)
Albumin: 4 g/dL (ref 3.5–5.0)
Alkaline Phosphatase: 81 U/L (ref 38–126)
Anion gap: 6 (ref 5–15)
BUN: 26 mg/dL — ABNORMAL HIGH (ref 8–23)
CO2: 27 mmol/L (ref 22–32)
Calcium: 9.4 mg/dL (ref 8.9–10.3)
Chloride: 107 mmol/L (ref 98–111)
Creatinine: 1.06 mg/dL (ref 0.61–1.24)
GFR, Estimated: >60 mL/min (ref >60)
Glucose, Bld: 145 mg/dL — ABNORMAL HIGH (ref 70–99)
Potassium: 4.6 mmol/L (ref 3.5–5.1)
Sodium: 140 mmol/L (ref 135–145)
Total Bilirubin: 0.5 mg/dL (ref 0.3–1.2)
Total Protein: 6 g/dL — ABNORMAL LOW (ref 6.5–8.1)

## 2023-04-15 LAB — LACTATE DEHYDROGENASE: LDH: 126 U/L (ref 98–192)

## 2023-04-15 LAB — CBC WITH DIFFERENTIAL (CANCER CENTER ONLY)
Abs Immature Granulocytes: 0.01 10*3/uL (ref 0.00–0.07)
Basophils Absolute: 0.1 10*3/uL (ref 0.0–0.1)
Basophils Relative: 2 %
Eosinophils Absolute: 0.9 10*3/uL — ABNORMAL HIGH (ref 0.0–0.5)
Eosinophils Relative: 23 %
HCT: 30.3 % — ABNORMAL LOW (ref 39.0–52.0)
Hemoglobin: 10 g/dL — ABNORMAL LOW (ref 13.0–17.0)
Immature Granulocytes: 0 %
Lymphocytes Relative: 32 %
Lymphs Abs: 1.2 10*3/uL (ref 0.7–4.0)
MCH: 33.4 pg (ref 26.0–34.0)
MCHC: 33 g/dL (ref 30.0–36.0)
MCV: 101.3 fL — ABNORMAL HIGH (ref 80.0–100.0)
Monocytes Absolute: 0.6 10*3/uL (ref 0.1–1.0)
Monocytes Relative: 16 %
Neutro Abs: 1.1 10*3/uL — ABNORMAL LOW (ref 1.7–7.7)
Neutrophils Relative %: 27 %
Platelet Count: 175 10*3/uL (ref 150–400)
RBC: 2.99 MIL/uL — ABNORMAL LOW (ref 4.22–5.81)
RDW: 15.5 % (ref 11.5–15.5)
WBC Count: 3.9 10*3/uL — ABNORMAL LOW (ref 4.0–10.5)
nRBC: 0 % (ref 0.0–0.2)

## 2023-04-15 MED ORDER — DARATUMUMAB-HYALURONIDASE-FIHJ 1800-30000 MG-UT/15ML ~~LOC~~ SOLN
1800.0000 mg | Freq: Once | SUBCUTANEOUS | Status: AC
  Administered 2023-04-15: 1800 mg via SUBCUTANEOUS
  Filled 2023-04-15: qty 15

## 2023-04-15 MED ORDER — BORTEZOMIB CHEMO SQ INJECTION 3.5 MG (2.5MG/ML)
1.3000 mg/m2 | Freq: Once | INTRAMUSCULAR | Status: AC
  Administered 2023-04-15: 2.75 mg via SUBCUTANEOUS
  Filled 2023-04-15: qty 1.1

## 2023-04-15 MED ORDER — DIPHENHYDRAMINE HCL 25 MG PO CAPS: 50.0000 mg | ORAL_CAPSULE | Freq: Once | ORAL | Status: DC

## 2023-04-15 MED ORDER — ACETAMINOPHEN 325 MG PO TABS: 650.0000 mg | ORAL_TABLET | Freq: Once | ORAL | Status: DC

## 2023-04-15 NOTE — Patient Instructions (Signed)
[facility]  Discharge Instructions: Thank you for choosing [facility] to provide your oncology and hematology care.   If you have a lab appointment with the Cancer Center, please go directly to the Cancer Center and check in at the registration area.  Wear comfortable clothing and clothing appropriate for easy access to any Portacath or PICC line.   We strive to give you quality time with your provider. You may need to reschedule your appointment if you arrive late (15 or more minutes).  Arriving late affects you and other patients whose appointments are after yours.  Also, if you miss three or more appointments without notifying the office, you may be dismissed from the clinic at the provider's discretion.      For prescription refill requests, have your pharmacy contact our office and allow 72 hours for refills to be completed.    Today you received the following chemotherapy and/or immunotherapy agents faspro, velcade    To help prevent nausea and vomiting after your treatment, we encourage you to take your nausea medication as directed.  BELOW ARE SYMPTOMS THAT SHOULD BE REPORTED IMMEDIATELY: *FEVER GREATER THAN 100.4 F (38 C) OR HIGHER *CHILLS OR SWEATING *NAUSEA AND VOMITING THAT IS NOT CONTROLLED WITH YOUR NAUSEA MEDICATION *UNUSUAL SHORTNESS OF BREATH *UNUSUAL BRUISING OR BLEEDING *URINARY PROBLEMS (pain or burning when urinating, or frequent urination) *BOWEL PROBLEMS (unusual diarrhea, constipation, pain near the anus) TENDERNESS IN MOUTH AND THROAT WITH OR WITHOUT PRESENCE OF ULCERS (sore throat, sores in mouth, or a toothache) UNUSUAL RASH, SWELLING OR PAIN  UNUSUAL VAGINAL DISCHARGE OR ITCHING   Items with * indicate a potential emergency and should be followed up as soon as possible or go to the Emergency Department if any problems should occur.  Please show the CHEMOTHERAPY ALERT CARD or IMMUNOTHERAPY ALERT CARD at  check-in to the Emergency Department and triage nurse. Should you have questions after your visit or need to cancel or reschedule your appointment, please contact  [facility]  [phone] and follow the prompts.  Office hours are 8:00 a.m. to 4:30 p.m. Monday - Friday. Please note that voicemails left after 4:00 p.m. may not be returned until the following business day.  We are closed weekends and major holidays. You have access to a nurse at all times for urgent questions. Please call the main number to the clinic [phone] and follow the prompts.  For any non-urgent questions, you may also contact your provider using MyChart. We now offer e-Visits for anyone 18 and older to request care online for non-urgent symptoms. For details visit [email or web address].   Also download the MyChart app! Go to the app store, search "MyChart", open the app, select , and log in with your MyChart username and password.   

## 2023-04-15 NOTE — Progress Notes (Signed)
Hematology and Oncology Follow Up Visit  Alexander Armstrong [id number] 01/09/1941 82 y.o. 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 --Start on 10/29/2022     Interim History:  Alexander Armstrong is back for follow-up.  He did have a bone marrow biopsy that was done.  This was done on 04/05/2023.  The pathology report [id number]) showed basically a normocellular bone marrow.  There is only 3% plasma cells.  As such, I said that he is in remission.  This is probably a very good remission/complete remission.  When we initially treated him, he think he had 35% plasma cells.  At this point, I think we start decrease the intensity of his therapy.  I will go ahead and stop the Revlimid.  I think he was seen by dermatology recently because of a rash.  I think that the Revlimid probably was causing the rash.  His wife is currently undergoing radiation therapy for a sarcoma behind her knee.  I think she is doing okay.  His last monoclonal studies back in June did not show monoclonal spike in his blood.  His IgG level was 635 mg/dL.  The Kappa light chain was 1.6 mg/dL.  He is eating well.  He has gone through his hernia surgery.  I think this was back in February.  He has had no cough or shortness of breath.  He has had no bleeding.  He said no change in bowel or bladder habits.  Overall, I would say his performance status is probably ECOG 0.   Medications:  Current Outpatient Medications:    Cholecalciferol (VITAMIN D3) 1000 units CAPS, Take 1,000 Units by mouth at bedtime., Disp: , Rfl:    clopidogrel (PLAVIX) 75 MG tablet, TAKE 1 TABLET(75 MG) BY MOUTH DAILY (Patient taking differently: Take 75 mg by mouth in the morning.),  Disp: 90 tablet, Rfl: 2   Cyanocobalamin 1000 MCG TBCR, Take 1,000 mcg by mouth daily., Disp: , Rfl:    dexamethasone (DECADRON) 4 MG tablet, Take 5 tabs (20 mg) weekly the day of daratumumab. Take with breakfast. (Patient taking differently: Take 20 mg by mouth See admin instructions. Take 20 mg with breakfast by mouth one hour prior to Daratumumab infusion on Mondays), Disp: 60 tablet, Rfl: 5   diphenhydrAMINE (BENADRYL ALLERGY) 25 MG tablet, Take 50 mg by mouth See admin instructions. Take 50 mg by mouth one hour prior to Daratumumab infusion on Mondays, Disp: , Rfl:    Docusate Sodium (DSS) 100 MG CAPS, Take by mouth daily., Disp: , Rfl:    famciclovir (FAMVIR) 250 MG tablet, TAKE 1 TABLET BY MOUTH EVERY DAY, Disp: 90 tablet, Rfl: 2   fluticasone (FLONASE) 50 MCG/ACT nasal spray, Place 1 spray into both nostrils daily as needed for allergies or rhinitis., Disp: , Rfl:    lenalidomide (REVLIMID) 15 MG capsule, Take 1 capsule (15 mg total) by mouth daily. [employer] Harvey #[id number], Disp: 21 capsule, Rfl: 0   loratadine (CLARITIN) 10 MG tablet, Take 10 mg by mouth in the morning., Disp: , Rfl:    MAGNESIUM PO, Take by mouth daily., Disp: , Rfl:    montelukast (SINGULAIR) 10 MG tablet, TAKE 1 TABLET BY MOUTH EVERYDAY AT BEDTIME, Disp: 90 tablet, Rfl: 2   pantoprazole (PROTONIX)  40 MG tablet, Take 1 tablet (40 mg total) by mouth daily. (Patient taking differently: Take 40 mg by mouth daily before breakfast.), Disp: 90 tablet, Rfl: 3   polyethylene glycol (MIRALAX / GLYCOLAX) 17 g packet, Take 17 g by mouth daily., Disp: , Rfl:    REFRESH PLUS 0.5 % SOLN, Place 1 drop into both eyes 3 (three) times daily as needed (for dryness)., Disp: , Rfl:    rosuvastatin (CRESTOR) 20 MG tablet, Take 1 tablet (20 mg total) by mouth daily., Disp: 90 tablet, Rfl: 3   tamsulosin (FLOMAX) 0.4 MG CAPS capsule, Take 1 capsule (0.4 mg total) by mouth at bedtime., Disp: 90 capsule, Rfl: 3   acetaminophen (TYLENOL) 500 MG  tablet, Take 500 mg by mouth 3 (three) times daily as needed for moderate pain or headache. (Patient not taking: Reported on 04/15/2023), Disp: , Rfl:    diltiazem (TIAZAC) 120 MG 24 hr capsule, Take 1 capsule (120 mg total) by mouth daily. (Patient not taking: Reported on 04/15/2023), Disp: 90 capsule, Rfl: 3  Allergies:  Allergies  Allergen Reactions   Latex Other (See Comments)    "takes the skin off" (tape)    Past Medical History, Surgical history, Social history, and Family History were reviewed and updated.  Review of Systems: Review of Systems  Constitutional: Negative.   HENT:  Negative.    Eyes: Negative.   Respiratory: Negative.    Cardiovascular: Negative.   Gastrointestinal: Negative.   Endocrine: Negative.   Genitourinary: Negative.    Musculoskeletal: Negative.   Skin: Negative.   Neurological: Negative.   Hematological: Negative.   Psychiatric/Behavioral: Negative.      Physical Exam:  height is 5\' 10"  (1.778 m) and weight is 193 lb 6.4 oz (87.7 kg). His oral temperature is 98 F (36.7 C). His blood pressure is 104/58 (abnormal) and his pulse is 62. His respiration is 20 and oxygen saturation is 99%.   Wt Readings from Last 3 Encounters:  04/15/23 193 lb 6.4 oz (87.7 kg)  04/05/23 195 lb (88.5 kg)  04/01/23 194 lb 6.4 oz (88.2 kg)    Physical Exam Vitals reviewed.  HENT:     Head: Normocephalic and atraumatic.  Eyes:     Pupils: Pupils are equal, round, and reactive to light.  Cardiovascular:     Rate and Rhythm: Normal rate and regular rhythm.     Heart sounds: Normal heart sounds.  Pulmonary:     Effort: Pulmonary effort is normal.     Breath sounds: Normal breath sounds.  Abdominal:     General: Bowel sounds are normal.     Palpations: Abdomen is soft.     Comments: Abdominal exam is somewhat distended.  The umbilical hernia is reducible.  He has active bowel sounds.  There is no guarding or rebound tenderness.  He has no palpable liver or spleen  tip.  There is no fluid wave.    Musculoskeletal:        General: No tenderness or deformity. Normal range of motion.     Cervical back: Normal range of motion.  Lymphadenopathy:     Cervical: No cervical adenopathy.  Skin:    General: Skin is warm and dry.     Findings: No erythema or rash.  Neurological:     Mental Status: He is alert and oriented to person, place, and time.  Psychiatric:        Behavior: Behavior normal.        Thought Content:  Thought content normal.        Judgment: Judgment normal.     Lab Results  Component Value Date   WBC 3.9 (L) 04/15/2023   HGB 10.0 (L) 04/15/2023   HCT 30.3 (L) 04/15/2023   MCV 101.3 (H) 04/15/2023   PLT 175 04/15/2023     Chemistry      Component Value Date/Time   NA 140 04/15/2023 0939   K 4.6 04/15/2023 0939   CL 107 04/15/2023 0939   CO2 27 04/15/2023 0939   BUN 26 (H) 04/15/2023 0939   CREATININE 1.06 04/15/2023 0939   CREATININE 1.33 (H) 01/21/2017 1616      Component Value Date/Time   CALCIUM 9.4 04/15/2023 0939   ALKPHOS 81 04/15/2023 0939   AST 15 04/15/2023 0939   ALT 17 04/15/2023 0939   BILITOT 0.5 04/15/2023 0939       Impression and Plan: Alexander Armstrong is a very nice 82 year old white male.  He has IgG kappa myeloma.  I do not have back yet the cytogenetics.  When we saw him initially, he had incredibly complex karyotype.  Will be interesting to see what this looks like now.  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  Again, we will pull back on the intensity of the myeloma therapy now.  I think this would be very reasonable.  We will going to still plan to see him back monthly.  Again he will stop the Revlimid.  I realize that this may be helpful if he does have an element of myelodysplasia.   Josph Macho, MD 7/11/20245:53 PM

## 2023-04-15 NOTE — Progress Notes (Signed)
Per Dr. Ennever, okay to treat today despite labs ?

## 2023-04-16 ENCOUNTER — Encounter (HOSPITAL_COMMUNITY): Payer: Self-pay | Admitting: Hematology & Oncology

## 2023-04-16 LAB — KAPPA/LAMBDA LIGHT CHAINS
Kappa free light chain: 20.3 mg/L — ABNORMAL HIGH (ref 3.3–19.4)
Kappa, lambda light chain ratio: 1.8 — ABNORMAL HIGH (ref 0.26–1.65)
Lambda free light chains: 11.3 mg/L (ref 5.7–26.3)

## 2023-04-17 LAB — IGG, IGA, IGM
IgA: 47 mg/dL — ABNORMAL LOW (ref 61–437)
IgG (Immunoglobin G), Serum: 683 mg/dL (ref 603–1613)
IgM (Immunoglobulin M), Srm: 11 mg/dL — ABNORMAL LOW (ref 15–143)

## 2023-04-19 LAB — PROTEIN ELECTROPHORESIS, SERUM, WITH REFLEX
A/G Ratio: 1.8 — ABNORMAL HIGH (ref 0.7–1.7)
Albumin ELP: 3.5 g/dL (ref 2.9–4.4)
Alpha-1-Globulin: 0.2 g/dL (ref 0.0–0.4)
Alpha-2-Globulin: 0.5 g/dL (ref 0.4–1.0)
Beta Globulin: 0.7 g/dL (ref 0.7–1.3)
Gamma Globulin: 0.6 g/dL (ref 0.4–1.8)
Globulin, Total: 2 g/dL — ABNORMAL LOW (ref 2.2–3.9)
Total Protein ELP: 5.5 g/dL — ABNORMAL LOW (ref 6.0–8.5)

## 2023-04-22 ENCOUNTER — Inpatient Hospital Stay: Payer: Medicare Other

## 2023-04-29 ENCOUNTER — Inpatient Hospital Stay: Payer: Medicare Other

## 2023-04-29 VITALS — BP 116/58 | HR 59 | Temp 98.1°F | Resp 19

## 2023-04-29 DIAGNOSIS — Z5112 Encounter for antineoplastic immunotherapy: Secondary | ICD-10-CM | POA: Diagnosis not present

## 2023-04-29 DIAGNOSIS — C9000 Multiple myeloma not having achieved remission: Secondary | ICD-10-CM

## 2023-04-29 LAB — CBC WITH DIFFERENTIAL (CANCER CENTER ONLY)
Abs Immature Granulocytes: 0.01 10*3/uL (ref 0.00–0.07)
Basophils Absolute: 0.1 10*3/uL (ref 0.0–0.1)
Basophils Relative: 3 %
Eosinophils Absolute: 0.3 10*3/uL (ref 0.0–0.5)
Eosinophils Relative: 6 %
HCT: 31.5 % — ABNORMAL LOW (ref 39.0–52.0)
Hemoglobin: 10.3 g/dL — ABNORMAL LOW (ref 13.0–17.0)
Immature Granulocytes: 0 %
Lymphocytes Relative: 27 %
Lymphs Abs: 1.4 10*3/uL (ref 0.7–4.0)
MCH: 33.3 pg (ref 26.0–34.0)
MCHC: 32.7 g/dL (ref 30.0–36.0)
MCV: 101.9 fL — ABNORMAL HIGH (ref 80.0–100.0)
Monocytes Absolute: 0.4 10*3/uL (ref 0.1–1.0)
Monocytes Relative: 8 %
Neutro Abs: 2.9 10*3/uL (ref 1.7–7.7)
Neutrophils Relative %: 56 %
Platelet Count: 110 10*3/uL — ABNORMAL LOW (ref 150–400)
RBC: 3.09 MIL/uL — ABNORMAL LOW (ref 4.22–5.81)
RDW: 15.4 % (ref 11.5–15.5)
WBC Count: 5.2 10*3/uL (ref 4.0–10.5)
nRBC: 0 % (ref 0.0–0.2)

## 2023-04-29 LAB — CMP (CANCER CENTER ONLY)
ALT: 18 U/L (ref 0–44)
AST: 17 U/L (ref 15–41)
Albumin: 4 g/dL (ref 3.5–5.0)
Alkaline Phosphatase: 63 U/L (ref 38–126)
Anion gap: 7 (ref 5–15)
BUN: 30 mg/dL — ABNORMAL HIGH (ref 8–23)
CO2: 27 mmol/L (ref 22–32)
Calcium: 9.7 mg/dL (ref 8.9–10.3)
Chloride: 107 mmol/L (ref 98–111)
Creatinine: 1.11 mg/dL (ref 0.61–1.24)
GFR, Estimated: >60 mL/min (ref >60)
Glucose, Bld: 167 mg/dL — ABNORMAL HIGH (ref 70–99)
Potassium: 4.7 mmol/L (ref 3.5–5.1)
Sodium: 141 mmol/L (ref 135–145)
Total Bilirubin: 0.5 mg/dL (ref 0.3–1.2)
Total Protein: 5.8 g/dL — ABNORMAL LOW (ref 6.5–8.1)

## 2023-04-29 MED ORDER — BORTEZOMIB CHEMO SQ INJECTION 3.5 MG (2.5MG/ML)
1.3000 mg/m2 | Freq: Once | INTRAMUSCULAR | Status: AC
  Administered 2023-04-29: 2.75 mg via SUBCUTANEOUS
  Filled 2023-04-29: qty 1.1

## 2023-04-29 MED ORDER — PROCHLORPERAZINE MALEATE 10 MG PO TABS
10.0000 mg | ORAL_TABLET | Freq: Once | ORAL | Status: AC
  Administered 2023-04-29: 10 mg via ORAL
  Filled 2023-04-29: qty 1

## 2023-04-29 NOTE — Patient Instructions (Signed)
[facility]  Discharge Instructions: Thank you for choosing [facility] to provide your oncology and hematology care.   If you have a lab appointment with the Cancer Center, please go directly to the Cancer Center and check in at the registration area.  Wear comfortable clothing and clothing appropriate for easy access to any Portacath or PICC line.   We strive to give you quality time with your provider. You may need to reschedule your appointment if you arrive late (15 or more minutes).  Arriving late affects you and other patients whose appointments are after yours.  Also, if you miss three or more appointments without notifying the office, you may be dismissed from the clinic at the provider's discretion.      For prescription refill requests, have your pharmacy contact our office and allow 72 hours for refills to be completed.    Today you received the following chemotherapy and/or immunotherapy agents Velcade.      To help prevent nausea and vomiting after your treatment, we encourage you to take your nausea medication as directed.  BELOW ARE SYMPTOMS THAT SHOULD BE REPORTED IMMEDIATELY: *FEVER GREATER THAN 100.4 F (38 C) OR HIGHER *CHILLS OR SWEATING *NAUSEA AND VOMITING THAT IS NOT CONTROLLED WITH YOUR NAUSEA MEDICATION *UNUSUAL SHORTNESS OF BREATH *UNUSUAL BRUISING OR BLEEDING *URINARY PROBLEMS (pain or burning when urinating, or frequent urination) *BOWEL PROBLEMS (unusual diarrhea, constipation, pain near the anus) TENDERNESS IN MOUTH AND THROAT WITH OR WITHOUT PRESENCE OF ULCERS (sore throat, sores in mouth, or a toothache) UNUSUAL RASH, SWELLING OR PAIN  UNUSUAL VAGINAL DISCHARGE OR ITCHING   Items with * indicate a potential emergency and should be followed up as soon as possible or go to the Emergency Department if any problems should occur.  Please show the CHEMOTHERAPY ALERT CARD or IMMUNOTHERAPY ALERT CARD at check-in  to the Emergency Department and triage nurse. Should you have questions after your visit or need to cancel or reschedule your appointment, please contact [facility]  [phone] and follow the prompts.  Office hours are 8:00 a.m. to 4:30 p.m. Monday - Friday. Please note that voicemails left after 4:00 p.m. may not be returned until the following business day.  We are closed weekends and major holidays. You have access to a nurse at all times for urgent questions. Please call the main number to the clinic [phone] and follow the prompts.  For any non-urgent questions, you may also contact your provider using MyChart. We now offer e-Visits for anyone 18 and older to request care online for non-urgent symptoms. For details visit [email or web address].   Also download the MyChart app! Go to the app store, search "MyChart", open the app, select [facility], and log in with your MyChart username and password.   

## 2023-04-30 ENCOUNTER — Other Ambulatory Visit: Payer: Self-pay | Admitting: *Deleted

## 2023-04-30 MED ORDER — LENALIDOMIDE 15 MG PO CAPS: 15.0000 mg | ORAL_CAPSULE | Freq: Every day | ORAL | 0 refills | Status: DC

## 2023-05-12 ENCOUNTER — Encounter: Payer: Self-pay | Admitting: Hematology & Oncology

## 2023-05-12 ENCOUNTER — Other Ambulatory Visit: Payer: Self-pay

## 2023-05-12 ENCOUNTER — Inpatient Hospital Stay: Payer: Medicare Other | Attending: Hematology & Oncology

## 2023-05-12 ENCOUNTER — Inpatient Hospital Stay (HOSPITAL_BASED_OUTPATIENT_CLINIC_OR_DEPARTMENT_OTHER): Payer: Medicare Other | Admitting: Hematology & Oncology

## 2023-05-12 VITALS — BP 107/60 | HR 75 | Temp 98.0°F | Resp 18 | Ht 70.0 in | Wt 194.1 lb

## 2023-05-12 DIAGNOSIS — C9000 Multiple myeloma not having achieved remission: Secondary | ICD-10-CM

## 2023-05-12 DIAGNOSIS — Z5112 Encounter for antineoplastic immunotherapy: Secondary | ICD-10-CM | POA: Diagnosis present

## 2023-05-12 DIAGNOSIS — D630 Anemia in neoplastic disease: Secondary | ICD-10-CM | POA: Insufficient documentation

## 2023-05-12 LAB — LACTATE DEHYDROGENASE: LDH: 128 U/L (ref 98–192)

## 2023-05-12 LAB — IRON AND IRON BINDING CAPACITY (CC-WL,HP ONLY)
Iron: 88 ug/dL (ref 45–182)
Saturation Ratios: 31 % (ref 17.9–39.5)
TIBC: 287 ug/dL (ref 250–450)
UIBC: 199 ug/dL (ref 117–376)

## 2023-05-12 LAB — CBC WITH DIFFERENTIAL (CANCER CENTER ONLY)
Abs Immature Granulocytes: 0.07 10*3/uL (ref 0.00–0.07)
Basophils Absolute: 0 10*3/uL (ref 0.0–0.1)
Basophils Relative: 0 %
Eosinophils Absolute: 0.3 10*3/uL (ref 0.0–0.5)
Eosinophils Relative: 5 %
HCT: 33.1 % — ABNORMAL LOW (ref 39.0–52.0)
Hemoglobin: 11.1 g/dL — ABNORMAL LOW (ref 13.0–17.0)
Immature Granulocytes: 1 %
Lymphocytes Relative: 25 %
Lymphs Abs: 1.5 10*3/uL (ref 0.7–4.0)
MCH: 33.7 pg (ref 26.0–34.0)
MCHC: 33.5 g/dL (ref 30.0–36.0)
MCV: 100.6 fL — ABNORMAL HIGH (ref 80.0–100.0)
Monocytes Absolute: 0.6 10*3/uL (ref 0.1–1.0)
Monocytes Relative: 10 %
Neutro Abs: 3.5 10*3/uL (ref 1.7–7.7)
Neutrophils Relative %: 59 %
Platelet Count: 141 10*3/uL — ABNORMAL LOW (ref 150–400)
RBC: 3.29 MIL/uL — ABNORMAL LOW (ref 4.22–5.81)
RDW: 14.8 % (ref 11.5–15.5)
WBC Count: 6 10*3/uL (ref 4.0–10.5)
nRBC: 0 % (ref 0.0–0.2)

## 2023-05-12 LAB — CMP (CANCER CENTER ONLY)
ALT: 15 U/L (ref 0–44)
AST: 16 U/L (ref 15–41)
Albumin: 4.3 g/dL (ref 3.5–5.0)
Alkaline Phosphatase: 61 U/L (ref 38–126)
Anion gap: 7 (ref 5–15)
BUN: 17 mg/dL (ref 8–23)
CO2: 27 mmol/L (ref 22–32)
Calcium: 9.1 mg/dL (ref 8.9–10.3)
Chloride: 106 mmol/L (ref 98–111)
Creatinine: 1.04 mg/dL (ref 0.61–1.24)
GFR, Estimated: >60 mL/min (ref >60)
Glucose, Bld: 119 mg/dL — ABNORMAL HIGH (ref 70–99)
Potassium: 4 mmol/L (ref 3.5–5.1)
Sodium: 140 mmol/L (ref 135–145)
Total Bilirubin: 0.4 mg/dL (ref 0.3–1.2)
Total Protein: 6.3 g/dL — ABNORMAL LOW (ref 6.5–8.1)

## 2023-05-12 LAB — RETICULOCYTES
Immature Retic Fract: 14.9 % (ref 2.3–15.9)
RBC.: 3.32 MIL/uL — ABNORMAL LOW (ref 4.22–5.81)
Retic Count, Absolute: 54.4 10*3/uL (ref 19.0–186.0)
Retic Ct Pct: 1.6 % (ref 0.4–3.1)

## 2023-05-12 LAB — FERRITIN: Ferritin: 71 ng/mL (ref 24–336)

## 2023-05-12 NOTE — Progress Notes (Signed)
Hematology and Oncology Follow Up Visit  Alexander Armstrong [id number] 19-Nov-1940 82 y.o. 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 --Start on 10/29/2022     Interim History:  Alexander Armstrong is back for follow-up.  He is doing quite well.  He really has had no complaints.  What I think is amazing his fact that he is complex karyotype is now normal.  This certainly is a good indicator that he responded well to the treatment.  His last monoclonal spike was negative with his blood.  He has had no nausea or vomiting.  There has been no change in bowel or bladder habits.  He has had no cough or shortness of breath.  He has had no rashes.  He has had no leg swelling.  Overall, I would say his performance status is probably ECOG 1.      Medications:  Current Outpatient Medications:    Cholecalciferol (VITAMIN D3) 1000 units CAPS, Take 1,000 Units by mouth at bedtime., Disp: , Rfl:    clopidogrel (PLAVIX) 75 MG tablet, TAKE 1 TABLET(75 MG) BY MOUTH DAILY (Patient taking differently: Take 75 mg by mouth in the morning.), Disp: 90 tablet, Rfl: 2   Cyanocobalamin 1000 MCG TBCR, Take 1,000 mcg by mouth daily., Disp: , Rfl:    dexamethasone (DECADRON) 4 MG tablet, Take 5 tabs (20 mg) weekly the day of daratumumab. Take with breakfast. (Patient taking differently: Take 20 mg by mouth See admin instructions. Take 20 mg with breakfast by mouth one hour prior to Daratumumab infusion on Mondays), Disp: 60 tablet, Rfl: 5   diphenhydrAMINE (BENADRYL ALLERGY) 25 MG tablet, Take 50 mg by mouth See admin instructions. Take 50 mg by mouth one hour prior to Daratumumab infusion on Mondays, Disp: , Rfl:     Docusate Sodium (DSS) 100 MG CAPS, Take by mouth daily., Disp: , Rfl:    famciclovir (FAMVIR) 250 MG tablet, TAKE 1 TABLET BY MOUTH EVERY DAY, Disp: 90 tablet, Rfl: 2   fluticasone (FLONASE) 50 MCG/ACT nasal spray, Place 1 spray into both nostrils daily as needed for allergies or rhinitis., Disp: , Rfl:    lenalidomide (REVLIMID) 15 MG capsule, Take 1 capsule (15 mg total) by mouth daily. [employer] Harvey #[id number], Disp: 21 capsule, Rfl: 0   loratadine (CLARITIN) 10 MG tablet, Take 10 mg by mouth in the morning., Disp: , Rfl:    MAGNESIUM PO, Take by mouth daily., Disp: , Rfl:    montelukast (SINGULAIR) 10 MG tablet, TAKE 1 TABLET BY MOUTH EVERYDAY AT BEDTIME, Disp: 90 tablet, Rfl: 2   pantoprazole (PROTONIX) 40 MG tablet, Take 1 tablet (40 mg total) by mouth daily. (Patient taking differently: Take 40 mg by mouth daily before breakfast.), Disp: 90 tablet, Rfl: 3   polyethylene glycol (MIRALAX / GLYCOLAX) 17 g packet, Take 17 g by mouth daily., Disp: , Rfl:    REFRESH PLUS 0.5 % SOLN, Place 1 drop into both eyes 3 (three) times daily as needed (for dryness)., Disp: , Rfl:    rosuvastatin (CRESTOR) 20 MG tablet, Take 1 tablet (20 mg total) by mouth daily., Disp: 90 tablet, Rfl: 3   tamsulosin (FLOMAX) 0.4 MG CAPS capsule, Take 1 capsule (0.4 mg total) by mouth at  bedtime., Disp: 90 capsule, Rfl: 3   acetaminophen (TYLENOL) 500 MG tablet, Take 500 mg by mouth 3 (three) times daily as needed for moderate pain or headache. (Patient not taking: Reported on 04/15/2023), Disp: , Rfl:    diltiazem (TIAZAC) 120 MG 24 hr capsule, Take 1 capsule (120 mg total) by mouth daily. (Patient not taking: Reported on 04/15/2023), Disp: 90 capsule, Rfl: 3  Allergies:  Allergies  Allergen Reactions   Latex Other (See Comments)    "takes the skin off" (tape)    Past Medical History, Surgical history, Social history, and Family History were reviewed and updated.  Review of Systems: Review of Systems  Constitutional:  Negative.   HENT:  Negative.    Eyes: Negative.   Respiratory: Negative.    Cardiovascular: Negative.   Gastrointestinal: Negative.   Endocrine: Negative.   Genitourinary: Negative.    Musculoskeletal: Negative.   Skin: Negative.   Neurological: Negative.   Hematological: Negative.   Psychiatric/Behavioral: Negative.      Physical Exam:  height is 5\' 10"  (1.778 m) and weight is 193 lb 12.5 oz (87.9 kg). His oral temperature is 98 F (36.7 C). His blood pressure is 107/60 and his pulse is 75. His respiration is 18 and oxygen saturation is 100%.   Wt Readings from Last 3 Encounters:  05/12/23 193 lb 12.5 oz (87.9 kg)  04/15/23 193 lb 6.4 oz (87.7 kg)  04/05/23 195 lb (88.5 kg)    Physical Exam Vitals reviewed.  HENT:     Head: Normocephalic and atraumatic.  Eyes:     Pupils: Pupils are equal, round, and reactive to light.  Cardiovascular:     Rate and Rhythm: Normal rate and regular rhythm.     Heart sounds: Normal heart sounds.  Pulmonary:     Effort: Pulmonary effort is normal.     Breath sounds: Normal breath sounds.  Abdominal:     General: Bowel sounds are normal.     Palpations: Abdomen is soft.     Comments: Abdominal exam is somewhat distended.  The umbilical hernia is reducible.  He has active bowel sounds.  There is no guarding or rebound tenderness.  He has no palpable liver or spleen tip.  There is no fluid wave.    Musculoskeletal:        General: No tenderness or deformity. Normal range of motion.     Cervical back: Normal range of motion.  Lymphadenopathy:     Cervical: No cervical adenopathy.  Skin:    General: Skin is warm and dry.     Findings: No erythema or rash.  Neurological:     Mental Status: He is alert and oriented to person, place, and time.  Psychiatric:        Behavior: Behavior normal.        Thought Content: Thought content normal.        Judgment: Judgment normal.     Lab Results  Component Value Date   WBC 6.0 05/12/2023    HGB 11.1 (L) 05/12/2023   HCT 33.1 (L) 05/12/2023   MCV 100.6 (H) 05/12/2023   PLT 141 (L) 05/12/2023     Chemistry      Component Value Date/Time   NA 141 04/29/2023 0915   K 4.7 04/29/2023 0915   CL 107 04/29/2023 0915   CO2 27 04/29/2023 0915   BUN 30 (H) 04/29/2023 0915   CREATININE 1.11 04/29/2023 0915   CREATININE 1.33 (H) 01/21/2017 1616  Component Value Date/Time   CALCIUM 9.7 04/29/2023 0915   ALKPHOS 63 04/29/2023 0915   AST 17 04/29/2023 0915   ALT 18 04/29/2023 0915   BILITOT 0.5 04/29/2023 0915       Impression and Plan: Mr. Hume is a very nice 82 year old white male.  He has IgG kappa myeloma.  Again the fact that his chromosomal abnormalities resolved with treatment certainly is quite encouraging.  I am sure this is probably why his hemoglobin is better.  For right now, we will continue him on his regular protocol.  I think this is reasonable.  I am just happy that his quality life is so good.  He is helping his wife now who has sarcoma behind her knee.    I will plan to see him back in 1 month.  Josph Macho, MD 8/7/202412:44 PM

## 2023-05-13 ENCOUNTER — Inpatient Hospital Stay: Payer: Medicare Other

## 2023-05-13 ENCOUNTER — Inpatient Hospital Stay: Payer: Medicare Other | Admitting: Hematology & Oncology

## 2023-05-13 VITALS — BP 127/73 | HR 67 | Temp 98.0°F | Resp 17

## 2023-05-13 DIAGNOSIS — Z5112 Encounter for antineoplastic immunotherapy: Secondary | ICD-10-CM | POA: Diagnosis not present

## 2023-05-13 DIAGNOSIS — C9000 Multiple myeloma not having achieved remission: Secondary | ICD-10-CM

## 2023-05-13 MED ORDER — DIPHENHYDRAMINE HCL 25 MG PO CAPS: 50.0000 mg | ORAL_CAPSULE | Freq: Once | ORAL | Status: DC

## 2023-05-13 MED ORDER — DARATUMUMAB-HYALURONIDASE-FIHJ 1800-30000 MG-UT/15ML ~~LOC~~ SOLN
1800.0000 mg | Freq: Once | SUBCUTANEOUS | Status: AC
  Administered 2023-05-13: 1800 mg via SUBCUTANEOUS
  Filled 2023-05-13: qty 15

## 2023-05-13 MED ORDER — BORTEZOMIB CHEMO SQ INJECTION 3.5 MG (2.5MG/ML)
1.3000 mg/m2 | Freq: Once | INTRAMUSCULAR | Status: AC
  Administered 2023-05-13: 2.75 mg via SUBCUTANEOUS
  Filled 2023-05-13: qty 1.1

## 2023-05-13 MED ORDER — ACETAMINOPHEN 325 MG PO TABS: 650.0000 mg | ORAL_TABLET | Freq: Once | ORAL | Status: DC

## 2023-05-13 NOTE — Patient Instructions (Signed)
[facility]  Discharge Instructions: Thank you for choosing [facility] to provide your oncology and hematology care.   If you have a lab appointment with the Cancer Center, please go directly to the Cancer Center and check in at the registration area.  Wear comfortable clothing and clothing appropriate for easy access to any Portacath or PICC line.   We strive to give you quality time with your provider. You may need to reschedule your appointment if you arrive late (15 or more minutes).  Arriving late affects you and other patients whose appointments are after yours.  Also, if you miss three or more appointments without notifying the office, you may be dismissed from the clinic at the provider's discretion.      For prescription refill requests, have your pharmacy contact our office and allow 72 hours for refills to be completed.    Today you received the following chemotherapy and/or immunotherapy agents Darzalex, Velcade.   To help prevent nausea and vomiting after your treatment, we encourage you to take your nausea medication as directed.  BELOW ARE SYMPTOMS THAT SHOULD BE REPORTED IMMEDIATELY: *FEVER GREATER THAN 100.4 F (38 C) OR HIGHER *CHILLS OR SWEATING *NAUSEA AND VOMITING THAT IS NOT CONTROLLED WITH YOUR NAUSEA MEDICATION *UNUSUAL SHORTNESS OF BREATH *UNUSUAL BRUISING OR BLEEDING *URINARY PROBLEMS (pain or burning when urinating, or frequent urination) *BOWEL PROBLEMS (unusual diarrhea, constipation, pain near the anus) TENDERNESS IN MOUTH AND THROAT WITH OR WITHOUT PRESENCE OF ULCERS (sore throat, sores in mouth, or a toothache) UNUSUAL RASH, SWELLING OR PAIN  UNUSUAL VAGINAL DISCHARGE OR ITCHING   Items with * indicate a potential emergency and should be followed up as soon as possible or go to the Emergency Department if any problems should occur.  Please show the CHEMOTHERAPY ALERT CARD or IMMUNOTHERAPY ALERT CARD at  check-in to the Emergency Department and triage nurse. Should you have questions after your visit or need to cancel or reschedule your appointment, please contact [facility]  [phone] and follow the prompts.  Office hours are 8:00 a.m. to 4:30 p.m. Monday - Friday. Please note that voicemails left after 4:00 p.m. may not be returned until the following business day.  We are closed weekends and major holidays. You have access to a nurse at all times for urgent questions. Please call the main number to the clinic [phone] and follow the prompts.  For any non-urgent questions, you may also contact your provider using MyChart. We now offer e-Visits for anyone 18 and older to request care online for non-urgent symptoms. For details visit [email or web address].   Also download the MyChart app! Go to the app store, search "MyChart", open the app, select , and log in with your MyChart username and password.   

## 2023-05-20 ENCOUNTER — Inpatient Hospital Stay: Payer: Medicare Other

## 2023-05-27 ENCOUNTER — Inpatient Hospital Stay: Payer: Medicare Other

## 2023-05-27 VITALS — BP 114/62 | HR 61 | Temp 98.2°F | Resp 18

## 2023-05-27 DIAGNOSIS — C9000 Multiple myeloma not having achieved remission: Secondary | ICD-10-CM

## 2023-05-27 DIAGNOSIS — Z5112 Encounter for antineoplastic immunotherapy: Secondary | ICD-10-CM | POA: Diagnosis not present

## 2023-05-27 LAB — CBC WITH DIFFERENTIAL (CANCER CENTER ONLY)
Abs Immature Granulocytes: 0.03 10*3/uL (ref 0.00–0.07)
Basophils Absolute: 0 10*3/uL (ref 0.0–0.1)
Basophils Relative: 0 %
Eosinophils Absolute: 0.1 10*3/uL (ref 0.0–0.5)
Eosinophils Relative: 1 %
HCT: 34.5 % — ABNORMAL LOW (ref 39.0–52.0)
Hemoglobin: 11.5 g/dL — ABNORMAL LOW (ref 13.0–17.0)
Immature Granulocytes: 0 %
Lymphocytes Relative: 18 %
Lymphs Abs: 1.5 10*3/uL (ref 0.7–4.0)
MCH: 33.9 pg (ref 26.0–34.0)
MCHC: 33.3 g/dL (ref 30.0–36.0)
MCV: 101.8 fL — ABNORMAL HIGH (ref 80.0–100.0)
Monocytes Absolute: 0.7 10*3/uL (ref 0.1–1.0)
Monocytes Relative: 8 %
Neutro Abs: 6.2 10*3/uL (ref 1.7–7.7)
Neutrophils Relative %: 73 %
Platelet Count: 119 10*3/uL — ABNORMAL LOW (ref 150–400)
RBC: 3.39 MIL/uL — ABNORMAL LOW (ref 4.22–5.81)
RDW: 14.4 % (ref 11.5–15.5)
WBC Count: 8.5 10*3/uL (ref 4.0–10.5)
nRBC: 0 % (ref 0.0–0.2)

## 2023-05-27 LAB — CMP (CANCER CENTER ONLY)
ALT: 14 U/L (ref 0–44)
AST: 15 U/L (ref 15–41)
Albumin: 4.2 g/dL (ref 3.5–5.0)
Alkaline Phosphatase: 65 U/L (ref 38–126)
Anion gap: 5 (ref 5–15)
BUN: 26 mg/dL — ABNORMAL HIGH (ref 8–23)
CO2: 28 mmol/L (ref 22–32)
Calcium: 9.6 mg/dL (ref 8.9–10.3)
Chloride: 106 mmol/L (ref 98–111)
Creatinine: 1.12 mg/dL (ref 0.61–1.24)
GFR, Estimated: >60 mL/min (ref >60)
Glucose, Bld: 148 mg/dL — ABNORMAL HIGH (ref 70–99)
Potassium: 4.7 mmol/L (ref 3.5–5.1)
Sodium: 139 mmol/L (ref 135–145)
Total Bilirubin: 0.3 mg/dL (ref 0.3–1.2)
Total Protein: 6.1 g/dL — ABNORMAL LOW (ref 6.5–8.1)

## 2023-05-27 MED ORDER — PROCHLORPERAZINE MALEATE 10 MG PO TABS: 10.0000 mg | ORAL_TABLET | Freq: Once | ORAL | Status: DC

## 2023-05-27 MED ORDER — BORTEZOMIB CHEMO SQ INJECTION 3.5 MG (2.5MG/ML)
1.3000 mg/m2 | Freq: Once | INTRAMUSCULAR | Status: AC
  Administered 2023-05-27: 2.75 mg via SUBCUTANEOUS
  Filled 2023-05-27: qty 1.1

## 2023-05-27 NOTE — Patient Instructions (Signed)
[facility]  Discharge Instructions: Thank you for choosing [facility] to provide your oncology and hematology care.   If you have a lab appointment with the Cancer Center, please go directly to the Cancer Center and check in at the registration area.  Wear comfortable clothing and clothing appropriate for easy access to any Portacath or PICC line.   We strive to give you quality time with your provider. You may need to reschedule your appointment if you arrive late (15 or more minutes).  Arriving late affects you and other patients whose appointments are after yours.  Also, if you miss three or more appointments without notifying the office, you may be dismissed from the clinic at the provider's discretion.      For prescription refill requests, have your pharmacy contact our office and allow 72 hours for refills to be completed.    Today you received the following chemotherapy and/or immunotherapy agents Velcade.      To help prevent nausea and vomiting after your treatment, we encourage you to take your nausea medication as directed.  BELOW ARE SYMPTOMS THAT SHOULD BE REPORTED IMMEDIATELY: *FEVER GREATER THAN 100.4 F (38 C) OR HIGHER *CHILLS OR SWEATING *NAUSEA AND VOMITING THAT IS NOT CONTROLLED WITH YOUR NAUSEA MEDICATION *UNUSUAL SHORTNESS OF BREATH *UNUSUAL BRUISING OR BLEEDING *URINARY PROBLEMS (pain or burning when urinating, or frequent urination) *BOWEL PROBLEMS (unusual diarrhea, constipation, pain near the anus) TENDERNESS IN MOUTH AND THROAT WITH OR WITHOUT PRESENCE OF ULCERS (sore throat, sores in mouth, or a toothache) UNUSUAL RASH, SWELLING OR PAIN  UNUSUAL VAGINAL DISCHARGE OR ITCHING   Items with * indicate a potential emergency and should be followed up as soon as possible or go to the Emergency Department if any problems should occur.  Please show the CHEMOTHERAPY ALERT CARD or IMMUNOTHERAPY ALERT CARD at check-in  to the Emergency Department and triage nurse. Should you have questions after your visit or need to cancel or reschedule your appointment, please contact [facility]  [phone] and follow the prompts.  Office hours are 8:00 a.m. to 4:30 p.m. Monday - Friday. Please note that voicemails left after 4:00 p.m. may not be returned until the following business day.  We are closed weekends and major holidays. You have access to a nurse at all times for urgent questions. Please call the main number to the clinic [phone] and follow the prompts.  For any non-urgent questions, you may also contact your provider using MyChart. We now offer e-Visits for anyone 18 and older to request care online for non-urgent symptoms. For details visit [email or web address].   Also download the MyChart app! Go to the app store, search "MyChart", open the app, select [facility], and log in with your MyChart username and password.   

## 2023-06-08 LAB — HM DIABETES EYE EXAM

## 2023-06-16 ENCOUNTER — Other Ambulatory Visit: Payer: Self-pay

## 2023-06-16 DIAGNOSIS — C9000 Multiple myeloma not having achieved remission: Secondary | ICD-10-CM

## 2023-06-17 ENCOUNTER — Other Ambulatory Visit: Payer: Self-pay | Admitting: Hematology & Oncology

## 2023-06-17 ENCOUNTER — Inpatient Hospital Stay: Payer: Medicare Other

## 2023-06-17 ENCOUNTER — Inpatient Hospital Stay: Payer: Medicare Other | Attending: Hematology & Oncology

## 2023-06-17 VITALS — BP 120/73 | HR 58 | Temp 97.6°F | Resp 18

## 2023-06-17 DIAGNOSIS — C9000 Multiple myeloma not having achieved remission: Secondary | ICD-10-CM | POA: Diagnosis present

## 2023-06-17 DIAGNOSIS — Z7902 Long term (current) use of antithrombotics/antiplatelets: Secondary | ICD-10-CM | POA: Insufficient documentation

## 2023-06-17 DIAGNOSIS — Z79899 Other long term (current) drug therapy: Secondary | ICD-10-CM | POA: Diagnosis not present

## 2023-06-17 DIAGNOSIS — D469 Myelodysplastic syndrome, unspecified: Secondary | ICD-10-CM | POA: Insufficient documentation

## 2023-06-17 DIAGNOSIS — Z79624 Long term (current) use of inhibitors of nucleotide synthesis: Secondary | ICD-10-CM | POA: Diagnosis not present

## 2023-06-17 DIAGNOSIS — Z5112 Encounter for antineoplastic immunotherapy: Secondary | ICD-10-CM | POA: Insufficient documentation

## 2023-06-17 DIAGNOSIS — Z7961 Long term (current) use of immunomodulator: Secondary | ICD-10-CM | POA: Insufficient documentation

## 2023-06-17 LAB — CBC WITH DIFFERENTIAL (CANCER CENTER ONLY)
Abs Immature Granulocytes: 0.02 10*3/uL (ref 0.00–0.07)
Basophils Absolute: 0 10*3/uL (ref 0.0–0.1)
Basophils Relative: 0 %
Eosinophils Absolute: 0.1 10*3/uL (ref 0.0–0.5)
Eosinophils Relative: 1 %
HCT: 34.7 % — ABNORMAL LOW (ref 39.0–52.0)
Hemoglobin: 11.6 g/dL — ABNORMAL LOW (ref 13.0–17.0)
Immature Granulocytes: 0 %
Lymphocytes Relative: 26 %
Lymphs Abs: 1.8 10*3/uL (ref 0.7–4.0)
MCH: 33.6 pg (ref 26.0–34.0)
MCHC: 33.4 g/dL (ref 30.0–36.0)
MCV: 100.6 fL — ABNORMAL HIGH (ref 80.0–100.0)
Monocytes Absolute: 0.8 10*3/uL (ref 0.1–1.0)
Monocytes Relative: 12 %
Neutro Abs: 4.2 10*3/uL (ref 1.7–7.7)
Neutrophils Relative %: 61 %
Platelet Count: 130 10*3/uL — ABNORMAL LOW (ref 150–400)
RBC: 3.45 MIL/uL — ABNORMAL LOW (ref 4.22–5.81)
RDW: 13.5 % (ref 11.5–15.5)
WBC Count: 6.9 10*3/uL (ref 4.0–10.5)
nRBC: 0 % (ref 0.0–0.2)

## 2023-06-17 LAB — CMP (CANCER CENTER ONLY)
ALT: 12 U/L (ref 0–44)
AST: 16 U/L (ref 15–41)
Albumin: 4.2 g/dL (ref 3.5–5.0)
Alkaline Phosphatase: 60 U/L (ref 38–126)
Anion gap: 4 — ABNORMAL LOW (ref 5–15)
BUN: 23 mg/dL (ref 8–23)
CO2: 30 mmol/L (ref 22–32)
Calcium: 9.6 mg/dL (ref 8.9–10.3)
Chloride: 105 mmol/L (ref 98–111)
Creatinine: 1.1 mg/dL (ref 0.61–1.24)
GFR, Estimated: >60 mL/min (ref >60)
Glucose, Bld: 110 mg/dL — ABNORMAL HIGH (ref 70–99)
Potassium: 4.7 mmol/L (ref 3.5–5.1)
Sodium: 139 mmol/L (ref 135–145)
Total Bilirubin: 0.4 mg/dL (ref 0.3–1.2)
Total Protein: 6.1 g/dL — ABNORMAL LOW (ref 6.5–8.1)

## 2023-06-17 MED ORDER — BORTEZOMIB CHEMO SQ INJECTION 3.5 MG (2.5MG/ML)
1.3000 mg/m2 | Freq: Once | INTRAMUSCULAR | Status: AC
  Administered 2023-06-17: 2.75 mg via SUBCUTANEOUS
  Filled 2023-06-17: qty 1.1

## 2023-06-17 MED ORDER — DIPHENHYDRAMINE HCL 25 MG PO CAPS: 50.0000 mg | ORAL_CAPSULE | Freq: Once | ORAL | Status: DC

## 2023-06-17 MED ORDER — ACETAMINOPHEN 325 MG PO TABS: 650.0000 mg | ORAL_TABLET | Freq: Once | ORAL | Status: DC

## 2023-06-17 MED ORDER — DARATUMUMAB-HYALURONIDASE-FIHJ 1800-30000 MG-UT/15ML ~~LOC~~ SOLN
1800.0000 mg | Freq: Once | SUBCUTANEOUS | Status: AC
  Administered 2023-06-17: 1800 mg via SUBCUTANEOUS
  Filled 2023-06-17: qty 15

## 2023-06-17 NOTE — Patient Instructions (Signed)
[facility]  Discharge Instructions: Thank you for choosing [facility] to provide your oncology and hematology care.   If you have a lab appointment with the Cancer Center, please go directly to the Cancer Center and check in at the registration area.  Wear comfortable clothing and clothing appropriate for easy access to any Portacath or PICC line.   We strive to give you quality time with your provider. You may need to reschedule your appointment if you arrive late (15 or more minutes).  Arriving late affects you and other patients whose appointments are after yours.  Also, if you miss three or more appointments without notifying the office, you may be dismissed from the clinic at the provider's discretion.      For prescription refill requests, have your pharmacy contact our office and allow 72 hours for refills to be completed.    Today you received the following chemotherapy and/or immunotherapy agents velcade, faspro     To help prevent nausea and vomiting after your treatment, we encourage you to take your nausea medication as directed.  BELOW ARE SYMPTOMS THAT SHOULD BE REPORTED IMMEDIATELY: *FEVER GREATER THAN 100.4 F (38 C) OR HIGHER *CHILLS OR SWEATING *NAUSEA AND VOMITING THAT IS NOT CONTROLLED WITH YOUR NAUSEA MEDICATION *UNUSUAL SHORTNESS OF BREATH *UNUSUAL BRUISING OR BLEEDING *URINARY PROBLEMS (pain or burning when urinating, or frequent urination) *BOWEL PROBLEMS (unusual diarrhea, constipation, pain near the anus) TENDERNESS IN MOUTH AND THROAT WITH OR WITHOUT PRESENCE OF ULCERS (sore throat, sores in mouth, or a toothache) UNUSUAL RASH, SWELLING OR PAIN  UNUSUAL VAGINAL DISCHARGE OR ITCHING   Items with * indicate a potential emergency and should be followed up as soon as possible or go to the Emergency Department if any problems should occur.  Please show the CHEMOTHERAPY ALERT CARD or IMMUNOTHERAPY ALERT CARD at  check-in to the Emergency Department and triage nurse. Should you have questions after your visit or need to cancel or reschedule your appointment, please contact [facility]  [phone] and follow the prompts.  Office hours are 8:00 a.m. to 4:30 p.m. Monday - Friday. Please note that voicemails left after 4:00 p.m. may not be returned until the following business day.  We are closed weekends and major holidays. You have access to a nurse at all times for urgent questions. Please call the main number to the clinic [phone] and follow the prompts.  For any non-urgent questions, you may also contact your provider using MyChart. We now offer e-Visits for anyone 18 and older to request care online for non-urgent symptoms. For details visit [email or web address].   Also download the MyChart app! Go to the app store, search "MyChart", open the app, select [facility], and log in with your MyChart username and password.   

## 2023-06-18 ENCOUNTER — Other Ambulatory Visit: Payer: Self-pay | Admitting: Hematology & Oncology

## 2023-07-01 ENCOUNTER — Inpatient Hospital Stay: Payer: Medicare Other | Admitting: Hematology & Oncology

## 2023-07-01 ENCOUNTER — Inpatient Hospital Stay: Payer: Medicare Other

## 2023-07-01 ENCOUNTER — Encounter: Payer: Self-pay | Admitting: Hematology & Oncology

## 2023-07-01 ENCOUNTER — Other Ambulatory Visit: Payer: Self-pay

## 2023-07-01 DIAGNOSIS — C9000 Multiple myeloma not having achieved remission: Secondary | ICD-10-CM

## 2023-07-01 DIAGNOSIS — Z5112 Encounter for antineoplastic immunotherapy: Secondary | ICD-10-CM | POA: Diagnosis not present

## 2023-07-01 LAB — CBC WITH DIFFERENTIAL (CANCER CENTER ONLY)
Abs Immature Granulocytes: 0.03 10*3/uL (ref 0.00–0.07)
Basophils Absolute: 0 10*3/uL (ref 0.0–0.1)
Basophils Relative: 0 %
Eosinophils Absolute: 0.1 10*3/uL (ref 0.0–0.5)
Eosinophils Relative: 1 %
HCT: 35.1 % — ABNORMAL LOW (ref 39.0–52.0)
Hemoglobin: 12 g/dL — ABNORMAL LOW (ref 13.0–17.0)
Immature Granulocytes: 0 %
Lymphocytes Relative: 21 %
Lymphs Abs: 1.7 10*3/uL (ref 0.7–4.0)
MCH: 34.1 pg — ABNORMAL HIGH (ref 26.0–34.0)
MCHC: 34.2 g/dL (ref 30.0–36.0)
MCV: 99.7 fL (ref 80.0–100.0)
Monocytes Absolute: 0.7 10*3/uL (ref 0.1–1.0)
Monocytes Relative: 9 %
Neutro Abs: 5.5 10*3/uL (ref 1.7–7.7)
Neutrophils Relative %: 69 %
Platelet Count: 140 10*3/uL — ABNORMAL LOW (ref 150–400)
RBC: 3.52 MIL/uL — ABNORMAL LOW (ref 4.22–5.81)
RDW: 13.1 % (ref 11.5–15.5)
WBC Count: 8.1 10*3/uL (ref 4.0–10.5)
nRBC: 0 % (ref 0.0–0.2)

## 2023-07-01 LAB — CMP (CANCER CENTER ONLY)
ALT: 12 U/L (ref 0–44)
AST: 15 U/L (ref 15–41)
Albumin: 4 g/dL (ref 3.5–5.0)
Alkaline Phosphatase: 59 U/L (ref 38–126)
Anion gap: 8 (ref 5–15)
BUN: 18 mg/dL (ref 8–23)
CO2: 27 mmol/L (ref 22–32)
Calcium: 9 mg/dL (ref 8.9–10.3)
Chloride: 105 mmol/L (ref 98–111)
Creatinine: 1.17 mg/dL (ref 0.61–1.24)
GFR, Estimated: >60 mL/min (ref >60)
Glucose, Bld: 123 mg/dL — ABNORMAL HIGH (ref 70–99)
Potassium: 4.1 mmol/L (ref 3.5–5.1)
Sodium: 140 mmol/L (ref 135–145)
Total Bilirubin: 0.5 mg/dL (ref 0.3–1.2)
Total Protein: 5.8 g/dL — ABNORMAL LOW (ref 6.5–8.1)

## 2023-07-01 LAB — LACTATE DEHYDROGENASE: LDH: 134 U/L (ref 98–192)

## 2023-07-01 MED ORDER — BORTEZOMIB CHEMO SQ INJECTION 3.5 MG (2.5MG/ML)
1.3000 mg/m2 | Freq: Once | INTRAMUSCULAR | Status: AC
  Administered 2023-07-01: 2.75 mg via SUBCUTANEOUS
  Filled 2023-07-01: qty 1.1

## 2023-07-01 MED ORDER — PROCHLORPERAZINE MALEATE 10 MG PO TABS: 10.0000 mg | ORAL_TABLET | Freq: Once | ORAL | Status: DC

## 2023-07-01 NOTE — Progress Notes (Signed)
Hematology and Oncology Follow Up Visit  Alexander Armstrong [id number] 01-04-1941 82 y.o. 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 --Start on 10/29/2022     Interim History:  Alexander Armstrong is back for follow-up.  The big news now is that he is going need to have oculoplastic surgery.  This should be okay from my point of view.  I really do not see that he should have any problems with surgery.  Of note, he is off Revlimid which probably would be the 1 that could cause problems.  Otherwise, he feels okay.  He be going to [place] next week.  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  His monoclonal studies would last saw him and did show a monoclonal spike 0.2 g/dL.  This could be transient.  He is responding fairly well to the Aranesp.  His hemoglobin is come up quite nicely.  He has had no problems with pain.  He has had no problems with nausea or vomiting.  He has had no bleeding.  He has had no leg swelling.  His appetite has been doing quite good.  Of note, his Kappa light chain was 0.9 mg/dL.  His IgG level was 640 mg/dL.  Overall, I would say his performance status is probably ECOG 1.      Medications:  Current Outpatient Medications:    acetaminophen (TYLENOL) 500 MG tablet, Take 500 mg by mouth 3 (three) times daily as needed for moderate pain or headache. (Patient not taking: Reported on 04/15/2023), Disp: , Rfl:    Cholecalciferol (VITAMIN D3) 1000 units CAPS, Take 1,000 Units by mouth at bedtime., Disp: , Rfl:    clopidogrel (PLAVIX) 75 MG tablet, TAKE 1 TABLET(75 MG) BY MOUTH DAILY (Patient taking differently: Take 75 mg by mouth in the morning.),  Disp: 90 tablet, Rfl: 2   Cyanocobalamin 1000 MCG TBCR, Take 1,000 mcg by mouth daily., Disp: , Rfl:    dexamethasone (DECADRON) 4 MG tablet, Take 5 tabs (20 mg) weekly the day of daratumumab. Take with breakfast. (Patient taking differently: Take 20 mg by mouth See admin instructions. Take 20 mg with breakfast by mouth one hour prior to Daratumumab infusion on Mondays), Disp: 60 tablet, Rfl: 5   diltiazem (TIAZAC) 120 MG 24 hr capsule, Take 1 capsule (120 mg total) by mouth daily. (Patient not taking: Reported on 04/15/2023), Disp: 90 capsule, Rfl: 3   diphenhydrAMINE (BENADRYL ALLERGY) 25 MG tablet, Take 50 mg by mouth See admin instructions. Take 50 mg by mouth one hour prior to Daratumumab infusion on Mondays, Disp: , Rfl:    Docusate Sodium (DSS) 100 MG CAPS, Take by mouth daily., Disp: , Rfl:    famciclovir (FAMVIR) 250 MG tablet, TAKE 1 TABLET BY MOUTH EVERY DAY, Disp: 90 tablet, Rfl: 2   fluticasone (FLONASE) 50 MCG/ACT nasal spray, Place 1 spray into both nostrils daily as needed for allergies or rhinitis., Disp: , Rfl:    lenalidomide (REVLIMID) 15 MG capsule, Take 1 capsule (15 mg total) by mouth daily. [employer] [facility] #[id number], Disp: 21 capsule, Rfl: 0   loratadine (CLARITIN) 10 MG tablet, Take 10 mg by mouth in the morning., Disp: , Rfl:    MAGNESIUM PO, Take  by mouth daily., Disp: , Rfl:    montelukast (SINGULAIR) 10 MG tablet, TAKE 1 TABLET BY MOUTH EVERYDAY AT BEDTIME, Disp: 90 tablet, Rfl: 2   pantoprazole (PROTONIX) 40 MG tablet, Take 1 tablet (40 mg total) by mouth daily. (Patient taking differently: Take 40 mg by mouth daily before breakfast.), Disp: 90 tablet, Rfl: 3   polyethylene glycol (MIRALAX / GLYCOLAX) 17 g packet, Take 17 g by mouth daily., Disp: , Rfl:    REFRESH PLUS 0.5 % SOLN, Place 1 drop into both eyes 3 (three) times daily as needed (for dryness)., Disp: , Rfl:    rosuvastatin (CRESTOR) 20 MG tablet, Take 1 tablet (20 mg total) by mouth daily., Disp: 90 tablet, Rfl:  3   tamsulosin (FLOMAX) 0.4 MG CAPS capsule, Take 1 capsule (0.4 mg total) by mouth at bedtime., Disp: 90 capsule, Rfl: 3  Allergies:  Allergies  Allergen Reactions   Latex Other (See Comments)    "takes the skin off" (tape)    Past Medical History, Surgical history, Social history, and Family History were reviewed and updated.  Review of Systems: Review of Systems  Constitutional: Negative.   HENT:  Negative.    Eyes: Negative.   Respiratory: Negative.    Cardiovascular: Negative.   Gastrointestinal: Negative.   Endocrine: Negative.   Genitourinary: Negative.    Musculoskeletal: Negative.   Skin: Negative.   Neurological: Negative.   Hematological: Negative.   Psychiatric/Behavioral: Negative.      Physical Exam:  height is 5\' 10"  (1.778 m) and weight is 196 lb (88.9 kg). His oral temperature is 98.1 F (36.7 C). His blood pressure is 94/63 and his pulse is 72. His respiration is 16 and oxygen saturation is 98%.   Wt Readings from Last 3 Encounters:  07/01/23 196 lb (88.9 kg)  05/12/23 194 lb 1.9 oz (88.1 kg)  04/15/23 193 lb 6.4 oz (87.7 kg)    Physical Exam Vitals reviewed.  HENT:     Head: Normocephalic and atraumatic.  Eyes:     Pupils: Pupils are equal, round, and reactive to light.  Cardiovascular:     Rate and Rhythm: Normal rate and regular rhythm.     Heart sounds: Normal heart sounds.  Pulmonary:     Effort: Pulmonary effort is normal.     Breath sounds: Normal breath sounds.  Abdominal:     General: Bowel sounds are normal.     Palpations: Abdomen is soft.     Comments: Abdominal exam is somewhat distended.  The umbilical hernia is reducible.  He has active bowel sounds.  There is no guarding or rebound tenderness.  He has no palpable liver or spleen tip.  There is no fluid wave.    Musculoskeletal:        General: No tenderness or deformity. Normal range of motion.     Cervical back: Normal range of motion.  Lymphadenopathy:     Cervical: No  cervical adenopathy.  Skin:    General: Skin is warm and dry.     Findings: No erythema or rash.  Neurological:     Mental Status: He is alert and oriented to person, place, and time.  Psychiatric:        Behavior: Behavior normal.        Thought Content: Thought content normal.        Judgment: Judgment normal.     Lab Results  Component Value Date   WBC 8.1 07/01/2023   HGB 12.0 (L) 07/01/2023  HCT 35.1 (L) 07/01/2023   MCV 99.7 07/01/2023   PLT 140 (L) 07/01/2023     Chemistry      Component Value Date/Time   NA 140 07/01/2023 0946   K 4.1 07/01/2023 0946   CL 105 07/01/2023 0946   CO2 27 07/01/2023 0946   BUN 18 07/01/2023 0946   CREATININE 1.17 07/01/2023 0946   CREATININE 1.33 (H) 01/21/2017 1616      Component Value Date/Time   CALCIUM 9.0 07/01/2023 0946   ALKPHOS 59 07/01/2023 0946   AST 15 07/01/2023 0946   ALT 12 07/01/2023 0946   BILITOT 0.5 07/01/2023 0946       Impression and Plan: Mr. Irizarry is a very nice 82 year old white male.  He has IgG kappa myeloma.  Again the fact that his chromosomal abnormalities resolved with treatment certainly is quite encouraging.  I am sure this is probably why his hemoglobin is better.  I told let me know when he is going to have surgery for his eyes.  We can adjust his protocol dates.  For right now, we will have him come back to see me in a couple of weeks.   Josph Macho, MD 9/26/202410:47 AM

## 2023-07-01 NOTE — Patient Instructions (Signed)
Bortezomib Injection What is this medication? BORTEZOMIB (bor TEZ oh mib) treats lymphoma. It may also be used to treat multiple myeloma, a type of bone marrow cancer. It works by blocking a protein that causes cancer cells to grow and multiply. This helps to slow or stop the spread of cancer cells. This medicine may be used for other purposes; ask your health care provider or pharmacist if you have questions. COMMON BRAND NAME(S): Velcade What should I tell my care team before I take this medication? They need to know if you have any of these conditions: Dehydration Diabetes Heart disease Liver disease Tingling of the fingers or toes or other nerve disorder An unusual or allergic reaction to bortezomib, other medications, foods, dyes, or preservatives If you or your partner are pregnant or trying to get pregnant Breastfeeding How should I use this medication? This medication is injected into a vein or under the skin. It is given by your care team in a hospital or clinic setting. Talk to your care team about the use of this medication in children. Special care may be needed. Overdosage: If you think you have taken too much of this medicine contact a poison control center or emergency room at once. NOTE: This medicine is only for you. Do not share this medicine with others. What if I miss a dose? Keep appointments for follow-up doses. It is important not to miss your dose. Call your care team if you are unable to keep an appointment. What may interact with this medication? Ketoconazole Rifampin This list may not describe all possible interactions. Give your health care provider a list of all the medicines, herbs, non-prescription drugs, or dietary supplements you use. Also tell them if you smoke, drink alcohol, or use illegal drugs. Some items may interact with your medicine. What should I watch for while using this medication? Your condition will be monitored carefully while you are  receiving this medication. You may need blood work while taking this medication. This medication may affect your coordination, reaction time, or judgment. Do not drive or operate machinery until you know how this medication affects you. Sit up or stand slowly to reduce the risk of dizzy or fainting spells. Drinking alcohol with this medication can increase the risk of these side effects. This medication may increase your risk of getting an infection. Call your care team for advice if you get a fever, chills, sore throat, or other symptoms of a cold or flu. Do not treat yourself. Try to avoid being around people who are sick. Check with your care team if you have severe diarrhea, nausea, and vomiting, or if you sweat a lot. The loss of too much body fluid may make it dangerous for you to take this medication. Talk to your care team if you may be pregnant. Serious birth defects can occur if you take this medication during pregnancy and for 7 months after the last dose. You will need a negative pregnancy test before starting this medication. Contraception is recommended while taking this medication and for 7 months after the last dose. Your care team can help you find the option that works for you. If your partner can get pregnant, use a condom during sex while taking this medication and for 4 months after the last dose. Do not breastfeed while taking this medication and for 2 months after the last dose. This medication may cause infertility. Talk to your care team if you are concerned about your fertility. What side effects  may I notice from receiving this medication? Side effects that you should report to your care team as soon as possible: Allergic reactions--skin rash, itching, hives, swelling of the face, lips, tongue, or throat Bleeding--bloody or black, tar-like stools, vomiting blood or brown material that looks like coffee grounds, red or dark brown urine, small red or purple spots on skin, unusual  bruising or bleeding Bleeding in the brain--severe headache, stiff neck, confusion, dizziness, change in vision, numbness or weakness of the face, arm, or leg, trouble speaking, trouble walking, vomiting Bowel blockage--stomach cramping, unable to have a bowel movement or pass gas, loss of appetite, vomiting Heart failure--shortness of breath, swelling of the ankles, feet, or hands, sudden weight gain, unusual weakness or fatigue Infection--fever, chills, cough, sore throat, wounds that don't heal, pain or trouble when passing urine, general feeling of discomfort or being unwell Liver injury--right upper belly pain, loss of appetite, nausea, light-colored stool, dark yellow or brown urine, yellowing skin or eyes, unusual weakness or fatigue Low blood pressure--dizziness, feeling faint or lightheaded, blurry vision Lung injury--shortness of breath or trouble breathing, cough, spitting up blood, chest pain, fever Pain, tingling, or numbness in the hands or feet Severe or prolonged diarrhea Stomach pain, bloody diarrhea, pale skin, unusual weakness or fatigue, decrease in the amount of urine, which may be signs of hemolytic uremic syndrome Sudden and severe headache, confusion, change in vision, seizures, which may be signs of posterior reversible encephalopathy syndrome (PRES) TTP--purple spots on the skin or inside the mouth, pale skin, yellowing skin or eyes, unusual weakness or fatigue, fever, fast or irregular heartbeat, confusion, change in vision, trouble speaking, trouble walking Tumor lysis syndrome (TLS)--nausea, vomiting, diarrhea, decrease in the amount of urine, dark urine, unusual weakness or fatigue, confusion, muscle pain or cramps, fast or irregular heartbeat, joint pain Side effects that usually do not require medical attention (report to your care team if they continue or are bothersome): Constipation Diarrhea Fatigue Loss of appetite Nausea This list may not describe all possible  side effects. Call your doctor for medical advice about side effects. You may report side effects to FDA at 1-800-FDA-1088. Where should I keep my medication? This medication is given in a hospital or clinic. It will not be stored at home. NOTE: This sheet is a summary. It may not cover all possible information. If you have questions about this medicine, talk to your doctor, pharmacist, or health care provider.  2024 [employer]/Gold Standard (2022-02-24 00:00:00)

## 2023-07-02 LAB — KAPPA/LAMBDA LIGHT CHAINS
Kappa free light chain: 9 mg/L (ref 3.3–19.4)
Kappa, lambda light chain ratio: 1.64 (ref 0.26–1.65)
Lambda free light chains: 5.5 mg/L — ABNORMAL LOW (ref 5.7–26.3)

## 2023-07-02 LAB — IGG, IGA, IGM
IgA: 20 mg/dL — ABNORMAL LOW (ref 61–437)
IgG (Immunoglobin G), Serum: 624 mg/dL (ref 603–1613)
IgM (Immunoglobulin M), Srm: 6 mg/dL — ABNORMAL LOW (ref 15–143)

## 2023-07-05 LAB — PROTEIN ELECTROPHORESIS, SERUM, WITH REFLEX
A/G Ratio: 1.6 (ref 0.7–1.7)
Albumin ELP: 3.7 g/dL (ref 2.9–4.4)
Alpha-1-Globulin: 0.2 g/dL (ref 0.0–0.4)
Alpha-2-Globulin: 0.7 g/dL (ref 0.4–1.0)
Beta Globulin: 0.9 g/dL (ref 0.7–1.3)
Gamma Globulin: 0.5 g/dL (ref 0.4–1.8)
Globulin, Total: 2.3 g/dL (ref 2.2–3.9)
Total Protein ELP: 6 g/dL (ref 6.0–8.5)

## 2023-07-15 ENCOUNTER — Inpatient Hospital Stay: Payer: Medicare Other | Attending: Hematology & Oncology

## 2023-07-15 ENCOUNTER — Inpatient Hospital Stay: Payer: Medicare Other

## 2023-07-15 VITALS — BP 116/61 | HR 76 | Temp 97.6°F | Resp 18

## 2023-07-15 DIAGNOSIS — C9000 Multiple myeloma not having achieved remission: Secondary | ICD-10-CM | POA: Diagnosis present

## 2023-07-15 DIAGNOSIS — Z5112 Encounter for antineoplastic immunotherapy: Secondary | ICD-10-CM | POA: Insufficient documentation

## 2023-07-15 LAB — CBC WITH DIFFERENTIAL (CANCER CENTER ONLY)
Abs Immature Granulocytes: 0.03 10*3/uL (ref 0.00–0.07)
Basophils Absolute: 0 10*3/uL (ref 0.0–0.1)
Basophils Relative: 0 %
Eosinophils Absolute: 0.1 10*3/uL (ref 0.0–0.5)
Eosinophils Relative: 2 %
HCT: 34.4 % — ABNORMAL LOW (ref 39.0–52.0)
Hemoglobin: 11.7 g/dL — ABNORMAL LOW (ref 13.0–17.0)
Immature Granulocytes: 0 %
Lymphocytes Relative: 19 %
Lymphs Abs: 1.8 10*3/uL (ref 0.7–4.0)
MCH: 33.8 pg (ref 26.0–34.0)
MCHC: 34 g/dL (ref 30.0–36.0)
MCV: 99.4 fL (ref 80.0–100.0)
Monocytes Absolute: 0.8 10*3/uL (ref 0.1–1.0)
Monocytes Relative: 8 %
Neutro Abs: 6.4 10*3/uL (ref 1.7–7.7)
Neutrophils Relative %: 71 %
Platelet Count: 151 10*3/uL (ref 150–400)
RBC: 3.46 MIL/uL — ABNORMAL LOW (ref 4.22–5.81)
RDW: 12.8 % (ref 11.5–15.5)
WBC Count: 9.1 10*3/uL (ref 4.0–10.5)
nRBC: 0 % (ref 0.0–0.2)

## 2023-07-15 LAB — CMP (CANCER CENTER ONLY)
ALT: 12 U/L (ref 0–44)
AST: 15 U/L (ref 15–41)
Albumin: 4 g/dL (ref 3.5–5.0)
Alkaline Phosphatase: 64 U/L (ref 38–126)
Anion gap: 8 (ref 5–15)
BUN: 28 mg/dL — ABNORMAL HIGH (ref 8–23)
CO2: 27 mmol/L (ref 22–32)
Calcium: 9.3 mg/dL (ref 8.9–10.3)
Chloride: 104 mmol/L (ref 98–111)
Creatinine: 1.07 mg/dL (ref 0.61–1.24)
GFR, Estimated: >60 mL/min (ref >60)
Glucose, Bld: 141 mg/dL — ABNORMAL HIGH (ref 70–99)
Potassium: 4.4 mmol/L (ref 3.5–5.1)
Sodium: 139 mmol/L (ref 135–145)
Total Bilirubin: 0.4 mg/dL (ref 0.3–1.2)
Total Protein: 6.2 g/dL — ABNORMAL LOW (ref 6.5–8.1)

## 2023-07-15 MED ORDER — ALBUTEROL SULFATE HFA 108 (90 BASE) MCG/ACT IN AERS: 2.0000 | INHALATION_SPRAY | Freq: Once | RESPIRATORY_TRACT | Status: DC | PRN

## 2023-07-15 MED ORDER — DIPHENHYDRAMINE HCL 50 MG/ML IJ SOLN: 50.0000 mg | Freq: Once | INTRAMUSCULAR | Status: DC | PRN

## 2023-07-15 MED ORDER — BORTEZOMIB CHEMO SQ INJECTION 3.5 MG (2.5MG/ML)
1.3000 mg/m2 | Freq: Once | INTRAMUSCULAR | Status: AC
  Administered 2023-07-15: 2.75 mg via SUBCUTANEOUS
  Filled 2023-07-15: qty 1.1

## 2023-07-15 MED ORDER — DIPHENHYDRAMINE HCL 25 MG PO CAPS: 50.0000 mg | ORAL_CAPSULE | Freq: Once | ORAL | Status: DC

## 2023-07-15 MED ORDER — SODIUM CHLORIDE 0.9 % IV SOLN: Freq: Once | INTRAVENOUS | Status: DC | PRN

## 2023-07-15 MED ORDER — EPINEPHRINE 0.3 MG/0.3ML IJ SOAJ: 0.3000 mg | Freq: Once | INTRAMUSCULAR | Status: DC | PRN

## 2023-07-15 MED ORDER — FAMOTIDINE IN NACL 20-0.9 MG/50ML-% IV SOLN: 20.0000 mg | Freq: Once | INTRAVENOUS | Status: DC | PRN

## 2023-07-15 MED ORDER — ACETAMINOPHEN 325 MG PO TABS: 650.0000 mg | ORAL_TABLET | Freq: Once | ORAL | Status: DC

## 2023-07-15 MED ORDER — DARATUMUMAB-HYALURONIDASE-FIHJ 1800-30000 MG-UT/15ML ~~LOC~~ SOLN
1800.0000 mg | Freq: Once | SUBCUTANEOUS | Status: AC
  Administered 2023-07-15: 1800 mg via SUBCUTANEOUS
  Filled 2023-07-15: qty 15

## 2023-07-15 MED ORDER — METHYLPREDNISOLONE SODIUM SUCC 125 MG IJ SOLR: 125.0000 mg | Freq: Once | INTRAMUSCULAR | Status: DC | PRN

## 2023-07-15 NOTE — Patient Instructions (Signed)
Daratumumab; Hyaluronidase Injection What is this medication? DARATUMUMAB; HYALURONIDASE (dar a toom ue mab; hye al ur ON i dase) treats multiple myeloma, a type of bone marrow cancer. Daratumumab works by blocking a protein that causes cancer cells to grow and multiply. This helps to slow or stop the spread of cancer cells. Hyaluronidase works by increasing the absorption of other medications in the body to help them work better. This medication may also be used treat amyloidosis, a condition that causes the buildup of a protein (amyloid) in your body. It works by reducing the buildup of this protein, which decreases symptoms. It is a combination medication that contains a monoclonal antibody. This medicine may be used for other purposes; ask your health care provider or pharmacist if you have questions. COMMON BRAND NAME(S): DARZALEX FASPRO What should I tell my care team before I take this medication? They need to know if you have any of these conditions: Heart disease Infection, such as chickenpox, cold sores, herpes, hepatitis B Lung or breathing disease An unusual or allergic reaction to daratumumab, hyaluronidase, other medications, foods, dyes, or preservatives Pregnant or trying to get pregnant Breast-feeding How should I use this medication? This medication is injected under the skin. It is given by your care team in a hospital or clinic setting. Talk to your care team about the use of this medication in children. Special care may be needed. Overdosage: If you think you have taken too much of this medicine contact a poison control center or emergency room at once. NOTE: This medicine is only for you. Do not share this medicine with others. What if I miss a dose? Keep appointments for follow-up doses. It is important not to miss your dose. Call your care team if you are unable to keep an appointment. What may interact with this medication? Interactions have not been studied. This list  may not describe all possible interactions. Give your health care provider a list of all the medicines, herbs, non-prescription drugs, or dietary supplements you use. Also tell them if you smoke, drink alcohol, or use illegal drugs. Some items may interact with your medicine. What should I watch for while using this medication? Your condition will be monitored carefully while you are receiving this medication. This medication can cause serious allergic reactions. To reduce your risk, your care team may give you other medication to take before receiving this one. Be sure to follow the directions from your care team. This medication can affect the results of blood tests to match your blood type. These changes can last for up to 6 months after the final dose. Your care team will do blood tests to match your blood type before you start treatment. Tell all of your care team that you are being treated with this medication before receiving a blood transfusion. This medication can affect the results of some tests used to determine treatment response; extra tests may be needed to evaluate response. Talk to your care team if you wish to become pregnant or think you are pregnant. This medication can cause serious birth defects if taken during pregnancy and for 3 months after the last dose. A reliable form of contraception is recommended while taking this medication and for 3 months after the last dose. Talk to your care team about effective forms of contraception. Do not breast-feed while taking this medication. What side effects may I notice from receiving this medication? Side effects that you should report to your care team as soon as   possible: Allergic reactions--skin rash, itching, hives, swelling of the face, lips, tongue, or throat Heart rhythm changes--fast or irregular heartbeat, dizziness, feeling faint or lightheaded, chest pain, trouble breathing Infection--fever, chills, cough, sore throat, wounds that  don't heal, pain or trouble when passing urine, general feeling of discomfort or being unwell Infusion reactions--chest pain, shortness of breath or trouble breathing, feeling faint or lightheaded Sudden eye pain or change in vision such as blurry vision, seeing halos around lights, vision loss Unusual bruising or bleeding Side effects that usually do not require medical attention (report to your care team if they continue or are bothersome): Constipation Diarrhea Fatigue Nausea Pain, tingling, or numbness in the hands or feet Swelling of the ankles, hands, or feet This list may not describe all possible side effects. Call your doctor for medical advice about side effects. You may report side effects to FDA at 1-800-FDA-1088. Where should I keep my medication? This medication is given in a hospital or clinic. It will not be stored at home. NOTE: This sheet is a summary. It may not cover all possible information. If you have questions about this medicine, talk to your doctor, pharmacist, or health care provider.  2024 Elsevier/Gold Standard (2022-01-27 00:00:00) Bortezomib Injection What is this medication? BORTEZOMIB (bor TEZ oh mib) treats lymphoma. It may also be used to treat multiple myeloma, a type of bone marrow cancer. It works by blocking a protein that causes cancer cells to grow and multiply. This helps to slow or stop the spread of cancer cells. This medicine may be used for other purposes; ask your health care provider or pharmacist if you have questions. COMMON BRAND NAME(S): Velcade What should I tell my care team before I take this medication? They need to know if you have any of these conditions: Dehydration Diabetes Heart disease Liver disease Tingling of the fingers or toes or other nerve disorder An unusual or allergic reaction to bortezomib, other medications, foods, dyes, or preservatives If you or your partner are pregnant or trying to get  pregnant Breastfeeding How should I use this medication? This medication is injected into a vein or under the skin. It is given by your care team in a hospital or clinic setting. Talk to your care team about the use of this medication in children. Special care may be needed. Overdosage: If you think you have taken too much of this medicine contact a poison control center or emergency room at once. NOTE: This medicine is only for you. Do not share this medicine with others. What if I miss a dose? Keep appointments for follow-up doses. It is important not to miss your dose. Call your care team if you are unable to keep an appointment. What may interact with this medication? Ketoconazole Rifampin This list may not describe all possible interactions. Give your health care provider a list of all the medicines, herbs, non-prescription drugs, or dietary supplements you use. Also tell them if you smoke, drink alcohol, or use illegal drugs. Some items may interact with your medicine. What should I watch for while using this medication? Your condition will be monitored carefully while you are receiving this medication. You may need blood work while taking this medication. This medication may affect your coordination, reaction time, or judgment. Do not drive or operate machinery until you know how this medication affects you. Sit up or stand slowly to reduce the risk of dizzy or fainting spells. Drinking alcohol with this medication can increase the risk of these  side effects. This medication may increase your risk of getting an infection. Call your care team for advice if you get a fever, chills, sore throat, or other symptoms of a cold or flu. Do not treat yourself. Try to avoid being around people who are sick. Check with your care team if you have severe diarrhea, nausea, and vomiting, or if you sweat a lot. The loss of too much body fluid may make it dangerous for you to take this medication. Talk to  your care team if you may be pregnant. Serious birth defects can occur if you take this medication during pregnancy and for 7 months after the last dose. You will need a negative pregnancy test before starting this medication. Contraception is recommended while taking this medication and for 7 months after the last dose. Your care team can help you find the option that works for you. If your partner can get pregnant, use a condom during sex while taking this medication and for 4 months after the last dose. Do not breastfeed while taking this medication and for 2 months after the last dose. This medication may cause infertility. Talk to your care team if you are concerned about your fertility. What side effects may I notice from receiving this medication? Side effects that you should report to your care team as soon as possible: Allergic reactions--skin rash, itching, hives, swelling of the face, lips, tongue, or throat Bleeding--bloody or black, tar-like stools, vomiting blood or brown material that looks like coffee grounds, red or dark brown urine, small red or purple spots on skin, unusual bruising or bleeding Bleeding in the brain--severe headache, stiff neck, confusion, dizziness, change in vision, numbness or weakness of the face, arm, or leg, trouble speaking, trouble walking, vomiting Bowel blockage--stomach cramping, unable to have a bowel movement or pass gas, loss of appetite, vomiting Heart failure--shortness of breath, swelling of the ankles, feet, or hands, sudden weight gain, unusual weakness or fatigue Infection--fever, chills, cough, sore throat, wounds that don't heal, pain or trouble when passing urine, general feeling of discomfort or being unwell Liver injury--right upper belly pain, loss of appetite, nausea, light-colored stool, dark yellow or brown urine, yellowing skin or eyes, unusual weakness or fatigue Low blood pressure--dizziness, feeling faint or lightheaded, blurry  vision Lung injury--shortness of breath or trouble breathing, cough, spitting up blood, chest pain, fever Pain, tingling, or numbness in the hands or feet Severe or prolonged diarrhea Stomach pain, bloody diarrhea, pale skin, unusual weakness or fatigue, decrease in the amount of urine, which may be signs of hemolytic uremic syndrome Sudden and severe headache, confusion, change in vision, seizures, which may be signs of posterior reversible encephalopathy syndrome (PRES) TTP--purple spots on the skin or inside the mouth, pale skin, yellowing skin or eyes, unusual weakness or fatigue, fever, fast or irregular heartbeat, confusion, change in vision, trouble speaking, trouble walking Tumor lysis syndrome (TLS)--nausea, vomiting, diarrhea, decrease in the amount of urine, dark urine, unusual weakness or fatigue, confusion, muscle pain or cramps, fast or irregular heartbeat, joint pain Side effects that usually do not require medical attention (report to your care team if they continue or are bothersome): Constipation Diarrhea Fatigue Loss of appetite Nausea This list may not describe all possible side effects. Call your doctor for medical advice about side effects. You may report side effects to FDA at 1-800-FDA-1088. Where should I keep my medication? This medication is given in a hospital or clinic. It will not be stored at home. NOTE:  This sheet is a summary. It may not cover all possible information. If you have questions about this medicine, talk to your doctor, pharmacist, or health care provider.  2024 Elsevier/Gold Standard (2022-02-24 00:00:00)

## 2023-07-16 ENCOUNTER — Other Ambulatory Visit: Payer: Self-pay | Admitting: Family Medicine

## 2023-07-20 ENCOUNTER — Encounter: Payer: Self-pay | Admitting: Cardiology

## 2023-07-20 ENCOUNTER — Ambulatory Visit: Payer: Medicare Other | Attending: Cardiology | Admitting: Cardiology

## 2023-07-20 VITALS — BP 104/62 | HR 82 | Ht 71.0 in | Wt 198.8 lb

## 2023-07-20 DIAGNOSIS — R079 Chest pain, unspecified: Secondary | ICD-10-CM | POA: Insufficient documentation

## 2023-07-20 DIAGNOSIS — E1169 Type 2 diabetes mellitus with other specified complication: Secondary | ICD-10-CM | POA: Insufficient documentation

## 2023-07-20 DIAGNOSIS — I9589 Other hypotension: Secondary | ICD-10-CM | POA: Insufficient documentation

## 2023-07-20 DIAGNOSIS — I252 Old myocardial infarction: Secondary | ICD-10-CM | POA: Insufficient documentation

## 2023-07-20 DIAGNOSIS — I2510 Atherosclerotic heart disease of native coronary artery without angina pectoris: Secondary | ICD-10-CM | POA: Diagnosis not present

## 2023-07-20 DIAGNOSIS — I4729 Other ventricular tachycardia: Secondary | ICD-10-CM | POA: Diagnosis not present

## 2023-07-20 DIAGNOSIS — C9000 Multiple myeloma not having achieved remission: Secondary | ICD-10-CM | POA: Insufficient documentation

## 2023-07-20 DIAGNOSIS — E785 Hyperlipidemia, unspecified: Secondary | ICD-10-CM | POA: Insufficient documentation

## 2023-07-20 DIAGNOSIS — R002 Palpitations: Secondary | ICD-10-CM | POA: Diagnosis not present

## 2023-07-20 NOTE — Assessment & Plan Note (Addendum)
Chest Pain in patient with known CAD. Brief, sharp episodes of chest pain, possibly musculoskeletal in nature due to recent physical exertion. However, the patient also reports concurrent episodes of palpitations and dizziness. -Order a 14-day Zio monitor to evaluate for possible arrhythmias. - Low threshold to consider Cardiac Stress PET for Ischemic Evaluation.   Another possibility is that he is having discomfort from scar tissue around his 2 separate loop recorders from years ago.

## 2023-07-20 NOTE — Assessment & Plan Note (Signed)
Palpitations and Dizziness Occurring several times a week, lasting seconds, and associated with irregular heart rate on home monitoring. The patient has a history of premature ventricular contractions (PVCs) and is off diltiazem. -Plan to restart diltiazem ER 120mg  for the last four days of the 14-day Zio monitor period to assess the effect on palpitations and dizziness. - suspect partly related to poor PO hydration The patient reports low fluid intake, primarily coffee and iced tea, which are diuretics. This could be contributing to episodes of dizziness. -Increase fluid intake, aiming for at least five 12-ounce servings per day, including water with meals and a hydration supplement such as Liquid IV between meals. Consider switching to decaffeinated coffee.

## 2023-07-20 NOTE — Assessment & Plan Note (Signed)
No longer on any medications for diabetes.  Pretty much out of the diabetes range.  Simply remains on moderate-dose rosuvastatin.  Labs followed by PCP.

## 2023-07-20 NOTE — Patient Instructions (Addendum)
Medication Instructions:  When you have 4 days left on Zio restart diltiazem 120 mg daily  *If you need a refill on your cardiac medications before your next appointment, please call your pharmacy*   Lab Work: None     Testing/Procedures:  Will be mailed to your home in 3-7 days  Your physician has recommended that you wear a holter monitor-14 day zio. Holter monitors are medical devices that record the heart's electrical activity. Doctors most often use these monitors to diagnose arrhythmias. Arrhythmias are problems with the speed or rhythm of the heartbeat. The monitor is a small, portable device. You can wear one while you do your normal daily activities. This is usually used to diagnose what is causing palpitations/syncope (passing out).    Follow-Up: At [facility], you and your health needs are our priority.  As part of our continuing mission to provide you with exceptional heart care, we have created designated Provider Care Teams.  These Care Teams include your primary Cardiologist (physician) and Advanced Practice Providers (APPs -  Physician Assistants and Nurse Practitioners) who all work together to provide you with the care you need, when you need it.    Your next appointment:   7 -8week(s)  Provider:   Bryan Lemma, MD     Other Instructions Drink a full glass of water with meals try to dink 12 oz daily  You can drink the liquid Iv hydration in between meals    ZIO XT- Long Term Monitor Instructions  Your physician has requested you wear a ZIO patch monitor for 14 days.  This is a single patch monitor. [employer] supplies one patch monitor per enrollment. Additional stickers are not available. Please do not apply patch if you will be having a Nuclear Stress Test,  Echocardiogram, Cardiac CT, MRI, or Chest Xray during the period you would be wearing the  monitor. The patch cannot be worn during these tests. You cannot remove and re-apply the  ZIO XT patch  monitor.  Your ZIO patch monitor will be mailed 3 day USPS to your address on file. It may take 3-5 days  to receive your monitor after you have been enrolled.  Once you have received your monitor, please review the enclosed instructions. Your monitor  has already been registered assigning a specific monitor serial # to you.  Billing and Patient Assistance Program Information  We have supplied [employer] with any of your insurance information on file for billing purposes. [employer] offers a sliding scale Patient Assistance Program for patients that do not have  insurance, or whose insurance does not completely cover the cost of the ZIO monitor.  You must apply for the Patient Assistance Program to qualify for this discounted rate.  To apply, please call [employer] at [phone], select option 4, select option 2, ask to apply for  Patient Assistance Program. Meredeth Ide will ask your household income, and how many people  are in your household. They will quote your out-of-pocket cost based on that information.  [employer] will also be able to set up a 64-month, interest-free payment plan if needed.  Applying the monitor   Shave hair from upper left chest.  Hold abrader disc by orange tab. Rub abrader in 40 strokes over the upper left chest as  indicated in your monitor instructions.  Clean area with 4 enclosed alcohol pads. Let dry.  Apply patch as indicated in monitor instructions. Patch will be placed under collarbone on left  side of chest with arrow  pointing upward.  Rub patch adhesive wings for 2 minutes. Remove white label marked "1". Remove the white  label marked "2". Rub patch adhesive wings for 2 additional minutes.  While looking in a mirror, press and release button in center of patch. A small green light will  flash 3-4 times. This will be your only indicator that the monitor has been turned on.  Do not shower for the first 24 hours. You may shower after the first 24 hours.  Press the  button if you feel a symptom. You will hear a small click. Record Date, Time and  Symptom in the Patient Logbook.  When you are ready to remove the patch, follow instructions on the last 2 pages of Patient  Logbook. Stick patch monitor onto the last page of Patient Logbook.  Place Patient Logbook in the blue and white box. Use locking tab on box and tape box closed  securely. The blue and white box has prepaid postage on it. Please place it in the mailbox as  soon as possible. Your physician should have your test results approximately 7 days after the  monitor has been mailed back to [facility]  Call [facility] Customer Care at [phone] if you have questions regarding  your ZIO XT patch monitor. Call them immediately if you see an orange light blinking on your  monitor.  If your monitor falls off in less than 4 days, contact our Monitor department at [phone].  If your monitor becomes loose or falls off after 4 days call [employer] at [phone] for  suggestions on securing your monitor

## 2023-07-20 NOTE — Assessment & Plan Note (Signed)
Seen on Myoview in the past.  Has not had any significant CHF symptoms with preserved EF.

## 2023-07-20 NOTE — Assessment & Plan Note (Addendum)
Has never really had anginal symptoms despite significant LAD disease.  Was evaluated for flutter sensations back in 2018 with a stress test that was positive for LAD ischemia and underwent LAD PCI and had done well since then with just mild symptoms off and on.  Last Myoview July 2022 was negative for ischemia and echocardiogram was normal.  Monitors have not been forthcoming.  Remains on Plavix monotherapy for existing stents.  Okay to hold 5 to 7 days preop for surgeries or procedures.  GDMT has been limited by hypotension therefore not on beta-blocker or other antihypertensive agents.  Has not started diltiazem. Would like to try to restart the diltiazem 120 mg daily because of palpitations and for antianginal benefit but need to ensure that he stays adequately hydrated to avoid dizziness. Continue 20 mg rosuvastatin Low threshold to consider either Myoview or Stress PET if symptoms persist despite ongoing evaluation.

## 2023-07-20 NOTE — Assessment & Plan Note (Signed)
The patient is currently undergoing treatment for multiple myeloma, which may be contributing to symptoms of dizziness. -Continue current treatment and monitor symptoms.

## 2023-07-20 NOTE — Assessment & Plan Note (Signed)
Has always had low blood pressures with or without being on diltiazem, as such I do not think it would be that bad for her to restart.  He does sound like he is not taking enough oral hydration and we have stressed importance of staying adequately hydrated.  He needs to drink a full glass of 12 to 16 ounces with each meal and additional 12 to 16 ounce glass of either water or rehydration drink in between meals.  Also discussed caffeine.  Low threshold to consider midodrine although that did not help in the past

## 2023-07-20 NOTE — Progress Notes (Signed)
Cardiology Office Note:  .   Date:  07/20/2023  ID:  Alexander Armstrong, DOB Feb 02, 1941, MRN [id number] PCP: Shelva Majestic, MD  [facility] Providers Cardiologist:  Bryan Lemma, MD Electrophysiologist:  Will Jorja Loa, MD     Chief Complaint  Patient presents with   Follow-up    Complaints of intermittent episodes of chest discomfort, fluttering/palpitations as well as dizziness.   Coronary Artery Disease    Not really having angina, but has never had angina.  Just the sharp chest pains.    Patient Profile: .     Alexander Armstrong is a 82 y.o. 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    Alexander Armstrong was last seen on 03/24/2023 noticing more frequent short bursts of fast heart rate spells.  Had previously been doing well off of the diltiazem, but felt he needed to go back on it.  He was also getting some dizzy spells.  Feeling tired but getting stronger.  He did have some lower extremity edema. => We added diltiazem ER 120 mg daily.  Subjective  Discussed the use of AI scribe software for clinical note transcription with the patient, who gave verbal consent to proceed.  History of Present Illness   The patient, a known CAD-PCI(MI > 40 yr ago & recurrent Cath-PCI), h/o Paroxysmal NSVT (WCT), chronic dizziness (no longer on BP meds), & relatively new Dx of multiple myeloma, presenting with intermittent chest discomfort and episodes of dizziness. The chest discomfort was described as sharp, fleeting, and localized to the area of a previously implanted loop recorder. The patient reported that these episodes of chest discomfort were not associated with any specific activity, but did note an episode following heavy lifting. The patient also reported experiencing brief  episodes of dizziness and lightheadedness, occurring three to four times a week, each lasting only a few seconds.  Retrospectively, he does correlate these "chest pain" episodes with the weird Heartbeat episodes  The patient has been off diltiazem for the past three to four months and has noticed an increase in these symptoms since discontinuation. The patient also reported observing irregularities in his heart rate during these episodes, as noted on a home oxygen sensor.  He has h/o chronically low BP levels (even off BP meds - not tolerating GDMT).  He has a tendency for Orthostatic hypotension (mlid drop in Orthostatic BP here today).  He admits to poor PO fluid intake, with a preference for caffeinated beverages.  In summary, the patient presented with intermittent chest discomfort and brief episodes of dizziness, with a background of multiple myeloma, a history of a heart attack, The fluttering/palpitation symptoms have increased since discontinuing diltiazem, and he has been observing irregularities in his heart rate during these episodes.  Despite not taking Diltiazem, he remains dizzy.       Cardiovascular ROS: positive for - chest pain, irregular heartbeat, palpitations, and associated with lightheadedness dizziness but also orthostatic dizziness. negative for - dyspnea on exertion, edema, orthopnea, paroxysmal nocturnal dyspnea, shortness of breath, or true syncope or near syncope.  TIA or emesis BX.  ROS:  Review of Systems - Negative except symptoms noted above.     Objective   Studies Reviewed: [name]   EKG Interpretation Date/Time:  Tuesday July 20 2023 15:59:48 EDT Ventricular  Rate:  75 PR Interval:  234 QRS Duration:  84 QT Interval:  372 QTC Calculation: 415 R Axis:   23  Text Interpretation: Sinus rhythm with 1st degree A-V block Low voltage QRS Cannot rule out Anterior infarct , age undetermined When compared with ECG of 21-Sep-2022 17:32, Reduced voltage in Precordial  leads Confirmed by Bryan Lemma ([id number]) on 07/20/2023 4:04:34 PM    Most Recent Cath -> (01/2017).  @ [facility] (Dr. Herbie Baltimore) CATH-PCI LAD: Prox RCA 55% ISR (FFR 0.86), 50% D1 @ bifurcation. Synergy DES 3.0 x 12 (for 99% mLAD lesion @ prox edge of old stent) - overlaps old stent with PTCA of old stent ISR>              Myoview July 2020: Small area of mid anteroseptal-apical apical infarct but no ischemia.  Normal EF  55-65%  Risk Assessment/Calculations:              Physical Exam:   VS:  BP 104/62 (BP Location: Left Arm, Patient Position: Sitting, Cuff Size: Normal)   Pulse 82   Ht 5\' 11"  (1.803 m)   Wt 198 lb 12.8 oz (90.2 kg)   SpO2 96%   BMI 27.73 kg/m    No data found.  Wt Readings from Last 3 Encounters:  07/20/23 198 lb 12.8 oz (90.2 kg)  07/01/23 196 lb (88.9 kg)  05/12/23 194 lb 1.9 oz (88.1 kg)    GEN: Well nourished, well developed in no acute distress; healthy-appearing.  Well-groomed. NECK: No JVD; No carotid bruits CARDIAC: Normal S1, S2; RRR with occasional ectopy, no murmurs, rubs, gallops RESPIRATORY:  Clear to auscultation without rales, wheezing or rhonchi ; nonlabored, good air movement. ABDOMEN: Soft, non-tender, non-distended EXTREMITIES:  No edema; No deformity      ASSESSMENT AND PLAN: .    Problem List Items Addressed This Visit       Cardiology Problems   Coronary artery disease involving native coronary artery of native heart without angina pectoris (Chronic)    Has never really had anginal symptoms despite significant LAD disease.  Was evaluated for flutter sensations back in 2018 with a stress test that was positive for LAD ischemia and underwent LAD PCI and had done well since then with just mild symptoms off and on.  Last Myoview July 2022 was negative for ischemia and echocardiogram was normal.  Monitors have not been forthcoming.  Remains on Plavix monotherapy for existing stents.  Okay to hold 5 to 7 days preop for surgeries or  procedures.  GDMT has been limited by hypotension therefore not on beta-blocker or other antihypertensive agents.  Has not started diltiazem. Would like to try to restart the diltiazem 120 mg daily because of palpitations and for antianginal benefit but need to ensure that he stays adequately hydrated to avoid dizziness. Continue 20 mg rosuvastatin Low threshold to consider either Myoview or Stress PET if symptoms persist despite ongoing evaluation.      Hyperlipidemia associated with type 2 diabetes mellitus (HCC) (Chronic)    No longer on any medications for diabetes.  Pretty much out of the diabetes range.  Simply remains on moderate-dose rosuvastatin.  Labs followed by PCP.      Hypotension (Chronic)    Has always had low blood pressures with or without being on diltiazem, as such I do not think it would be that bad for her to restart.  He does sound like he is not taking enough oral hydration and we have stressed  importance of staying adequately hydrated.  He needs to drink a full glass of 12 to 16 ounces with each meal and additional 12 to 16 ounce glass of either water or rehydration drink in between meals.  Also discussed caffeine.  Low threshold to consider midodrine although that did not help in the past      Relevant Orders   EKG 12-Lead (Completed)   Paroxysmal VT (HCC) (Chronic)    History of wide-complex tachycardia.  Longer was on flecainide subsequently discontinued.  Had been pretty well-controlled with diltiazem but he is reluctant to take the pleasant because of orthostatic dizziness.  Plan: Check 14-day Zio patch monitor and ask him to restart diltiazem for the last 4 days of the monitor.  Will need to ensure adequate hydration to avoid orthostatic hypotension.        Other   Chest pain of uncertain etiology    Chest Pain in patient with known CAD. Brief, sharp episodes of chest pain, possibly musculoskeletal in nature due to recent physical exertion. However,  the patient also reports concurrent episodes of palpitations and dizziness. -Order a 14-day Zio monitor to evaluate for possible arrhythmias. - Low threshold to consider Cardiac Stress PET for Ischemic Evaluation.   Another possibility is that he is having discomfort from scar tissue around his 2 separate loop recorders from years ago.      History of acute anterior wall MI (Chronic)    Seen on Myoview in the past.  Has not had any significant CHF symptoms with preserved EF.      Multiple myeloma (HCC) (Chronic)    The patient is currently undergoing treatment for multiple myeloma, which may be contributing to symptoms of dizziness. -Continue current treatment and monitor symptoms.       Palpitations - Primary (Chronic)    Palpitations and Dizziness Occurring several times a week, lasting seconds, and associated with irregular heart rate on home monitoring. The patient has a history of premature ventricular contractions (PVCs) and is off diltiazem. -Plan to restart diltiazem ER 120mg  for the last four days of the 14-day Zio monitor period to assess the effect on palpitations and dizziness. - suspect partly related to poor PO hydration The patient reports low fluid intake, primarily coffee and iced tea, which are diuretics. This could be contributing to episodes of dizziness. -Increase fluid intake, aiming for at least five 12-ounce servings per day, including water with meals and a hydration supplement such as Liquid IV between meals. Consider switching to decaffeinated coffee.      Relevant Orders   LONG TERM MONITOR (3-14 DAYS)        Dispo: Return in about 6 weeks (around 08/31/2023) for Routine Follow-up after testing ~ 1-2 months (either as in person or virtual.).  Total time spent: 36 min spent with patient + 20 min spent charting = 56 min      Signed, Marykay Lex, MD, MS Bryan Lemma, M.D., M.S. Interventional Cardiologist  [facility]  Pager #  [phone] Phone # [phone] [address] [place], [place] [address]

## 2023-07-20 NOTE — Assessment & Plan Note (Signed)
History of wide-complex tachycardia.  Alexander Armstrong was on flecainide subsequently discontinued.  Had been pretty well-controlled with diltiazem but he is reluctant to take the pleasant because of orthostatic dizziness.  Plan: Check 14-day Zio patch monitor and ask him to restart diltiazem for the last 4 days of the monitor.  Will need to ensure adequate hydration to avoid orthostatic hypotension.

## 2023-07-21 ENCOUNTER — Ambulatory Visit: Payer: Medicare Other | Attending: Cardiology

## 2023-07-21 DIAGNOSIS — R002 Palpitations: Secondary | ICD-10-CM

## 2023-07-21 NOTE — Progress Notes (Unsigned)
Enrolled patient for a 14 day Zio XT  monitor to be mailed to patients home  °

## 2023-07-24 DIAGNOSIS — R002 Palpitations: Secondary | ICD-10-CM

## 2023-07-29 ENCOUNTER — Inpatient Hospital Stay: Payer: Medicare Other

## 2023-07-29 VITALS — BP 139/81 | HR 76 | Temp 97.8°F | Resp 18

## 2023-07-29 DIAGNOSIS — C9000 Multiple myeloma not having achieved remission: Secondary | ICD-10-CM

## 2023-07-29 DIAGNOSIS — Z5112 Encounter for antineoplastic immunotherapy: Secondary | ICD-10-CM | POA: Diagnosis not present

## 2023-07-29 LAB — CBC WITH DIFFERENTIAL (CANCER CENTER ONLY)
Abs Immature Granulocytes: 0.03 10*3/uL (ref 0.00–0.07)
Basophils Absolute: 0 10*3/uL (ref 0.0–0.1)
Basophils Relative: 0 %
Eosinophils Absolute: 0.2 10*3/uL (ref 0.0–0.5)
Eosinophils Relative: 2 %
HCT: 35.4 % — ABNORMAL LOW (ref 39.0–52.0)
Hemoglobin: 12 g/dL — ABNORMAL LOW (ref 13.0–17.0)
Immature Granulocytes: 0 %
Lymphocytes Relative: 20 %
Lymphs Abs: 1.7 10*3/uL (ref 0.7–4.0)
MCH: 33.9 pg (ref 26.0–34.0)
MCHC: 33.9 g/dL (ref 30.0–36.0)
MCV: 100 fL (ref 80.0–100.0)
Monocytes Absolute: 0.8 10*3/uL (ref 0.1–1.0)
Monocytes Relative: 10 %
Neutro Abs: 5.6 10*3/uL (ref 1.7–7.7)
Neutrophils Relative %: 68 %
Platelet Count: 152 10*3/uL (ref 150–400)
RBC: 3.54 MIL/uL — ABNORMAL LOW (ref 4.22–5.81)
RDW: 12.5 % (ref 11.5–15.5)
WBC Count: 8.2 10*3/uL (ref 4.0–10.5)
nRBC: 0 % (ref 0.0–0.2)

## 2023-07-29 LAB — CMP (CANCER CENTER ONLY)
ALT: 13 U/L (ref 0–44)
AST: 15 U/L (ref 15–41)
Albumin: 4.5 g/dL (ref 3.5–5.0)
Alkaline Phosphatase: 62 U/L (ref 38–126)
Anion gap: 6 (ref 5–15)
BUN: 25 mg/dL — ABNORMAL HIGH (ref 8–23)
CO2: 30 mmol/L (ref 22–32)
Calcium: 9.3 mg/dL (ref 8.9–10.3)
Chloride: 104 mmol/L (ref 98–111)
Creatinine: 1.13 mg/dL (ref 0.61–1.24)
GFR, Estimated: >60 mL/min (ref >60)
Glucose, Bld: 167 mg/dL — ABNORMAL HIGH (ref 70–99)
Potassium: 4.3 mmol/L (ref 3.5–5.1)
Sodium: 140 mmol/L (ref 135–145)
Total Bilirubin: 0.5 mg/dL (ref 0.3–1.2)
Total Protein: 6 g/dL — ABNORMAL LOW (ref 6.5–8.1)

## 2023-07-29 MED ORDER — PROCHLORPERAZINE MALEATE 10 MG PO TABS: 10.0000 mg | ORAL_TABLET | Freq: Once | ORAL | Status: DC

## 2023-07-29 MED ORDER — BORTEZOMIB CHEMO SQ INJECTION 3.5 MG (2.5MG/ML)
1.3000 mg/m2 | Freq: Once | INTRAMUSCULAR | Status: AC
  Administered 2023-07-29: 2.75 mg via SUBCUTANEOUS
  Filled 2023-07-29: qty 1.1

## 2023-07-29 NOTE — Patient Instructions (Signed)
[facility]  Discharge Instructions: Thank you for choosing [facility] to provide your oncology and hematology care.   If you have a lab appointment with the Cancer Center, please go directly to the Cancer Center and check in at the registration area.  Wear comfortable clothing and clothing appropriate for easy access to any Portacath or PICC line.   We strive to give you quality time with your provider. You may need to reschedule your appointment if you arrive late (15 or more minutes).  Arriving late affects you and other patients whose appointments are after yours.  Also, if you miss three or more appointments without notifying the office, you may be dismissed from the clinic at the provider's discretion.      For prescription refill requests, have your pharmacy contact our office and allow 72 hours for refills to be completed.    Today you received the following chemotherapy and/or immunotherapy agents Velcade.      To help prevent nausea and vomiting after your treatment, we encourage you to take your nausea medication as directed.  BELOW ARE SYMPTOMS THAT SHOULD BE REPORTED IMMEDIATELY: *FEVER GREATER THAN 100.4 F (38 C) OR HIGHER *CHILLS OR SWEATING *NAUSEA AND VOMITING THAT IS NOT CONTROLLED WITH YOUR NAUSEA MEDICATION *UNUSUAL SHORTNESS OF BREATH *UNUSUAL BRUISING OR BLEEDING *URINARY PROBLEMS (pain or burning when urinating, or frequent urination) *BOWEL PROBLEMS (unusual diarrhea, constipation, pain near the anus) TENDERNESS IN MOUTH AND THROAT WITH OR WITHOUT PRESENCE OF ULCERS (sore throat, sores in mouth, or a toothache) UNUSUAL RASH, SWELLING OR PAIN  UNUSUAL VAGINAL DISCHARGE OR ITCHING   Items with * indicate a potential emergency and should be followed up as soon as possible or go to the Emergency Department if any problems should occur.  Please show the CHEMOTHERAPY ALERT CARD or IMMUNOTHERAPY ALERT CARD at check-in  to the Emergency Department and triage nurse. Should you have questions after your visit or need to cancel or reschedule your appointment, please contact [facility]  [phone] and follow the prompts.  Office hours are 8:00 a.m. to 4:30 p.m. Monday - Friday. Please note that voicemails left after 4:00 p.m. may not be returned until the following business day.  We are closed weekends and major holidays. You have access to a nurse at all times for urgent questions. Please call the main number to the clinic [phone] and follow the prompts.  For any non-urgent questions, you may also contact your provider using MyChart. We now offer e-Visits for anyone 18 and older to request care online for non-urgent symptoms. For details visit [email or web address].   Also download the MyChart app! Go to the app store, search "MyChart", open the app, select [facility], and log in with your MyChart username and password.   

## 2023-08-12 ENCOUNTER — Encounter: Payer: Self-pay | Admitting: Medical Oncology

## 2023-08-12 ENCOUNTER — Inpatient Hospital Stay: Payer: Medicare Other

## 2023-08-12 ENCOUNTER — Inpatient Hospital Stay: Payer: Medicare Other | Attending: Hematology & Oncology | Admitting: Medical Oncology

## 2023-08-12 VITALS — BP 91/68 | HR 78 | Temp 98.2°F | Resp 18 | Wt 201.0 lb

## 2023-08-12 DIAGNOSIS — D469 Myelodysplastic syndrome, unspecified: Secondary | ICD-10-CM | POA: Diagnosis not present

## 2023-08-12 DIAGNOSIS — C9000 Multiple myeloma not having achieved remission: Secondary | ICD-10-CM

## 2023-08-12 DIAGNOSIS — Z5112 Encounter for antineoplastic immunotherapy: Secondary | ICD-10-CM | POA: Insufficient documentation

## 2023-08-12 LAB — CBC WITH DIFFERENTIAL (CANCER CENTER ONLY)
Abs Immature Granulocytes: 0.03 10*3/uL (ref 0.00–0.07)
Basophils Absolute: 0.1 10*3/uL (ref 0.0–0.1)
Basophils Relative: 1 %
Eosinophils Absolute: 0.2 10*3/uL (ref 0.0–0.5)
Eosinophils Relative: 2 %
HCT: 34 % — ABNORMAL LOW (ref 39.0–52.0)
Hemoglobin: 11.8 g/dL — ABNORMAL LOW (ref 13.0–17.0)
Immature Granulocytes: 0 %
Lymphocytes Relative: 18 %
Lymphs Abs: 1.7 10*3/uL (ref 0.7–4.0)
MCH: 34.3 pg — ABNORMAL HIGH (ref 26.0–34.0)
MCHC: 34.7 g/dL (ref 30.0–36.0)
MCV: 98.8 fL (ref 80.0–100.0)
Monocytes Absolute: 0.9 10*3/uL (ref 0.1–1.0)
Monocytes Relative: 9 %
Neutro Abs: 6.6 10*3/uL (ref 1.7–7.7)
Neutrophils Relative %: 70 %
Platelet Count: 139 10*3/uL — ABNORMAL LOW (ref 150–400)
RBC: 3.44 MIL/uL — ABNORMAL LOW (ref 4.22–5.81)
RDW: 12.4 % (ref 11.5–15.5)
WBC Count: 9.4 10*3/uL (ref 4.0–10.5)
nRBC: 0 % (ref 0.0–0.2)

## 2023-08-12 LAB — CMP (CANCER CENTER ONLY)
ALT: 11 U/L (ref 0–44)
AST: 13 U/L — ABNORMAL LOW (ref 15–41)
Albumin: 4.3 g/dL (ref 3.5–5.0)
Alkaline Phosphatase: 61 U/L (ref 38–126)
Anion gap: 5 (ref 5–15)
BUN: 20 mg/dL (ref 8–23)
CO2: 29 mmol/L (ref 22–32)
Calcium: 9.4 mg/dL (ref 8.9–10.3)
Chloride: 105 mmol/L (ref 98–111)
Creatinine: 1.12 mg/dL (ref 0.61–1.24)
GFR, Estimated: >60 mL/min (ref >60)
Glucose, Bld: 150 mg/dL — ABNORMAL HIGH (ref 70–99)
Potassium: 4.1 mmol/L (ref 3.5–5.1)
Sodium: 139 mmol/L (ref 135–145)
Total Bilirubin: 0.4 mg/dL (ref <1.2)
Total Protein: 6.1 g/dL — ABNORMAL LOW (ref 6.5–8.1)

## 2023-08-12 LAB — LACTATE DEHYDROGENASE: LDH: 137 U/L (ref 98–192)

## 2023-08-12 NOTE — Progress Notes (Signed)
Hematology and Oncology Follow Up Visit  Alexander Armstrong [id number] 29-Aug-1941 82 y.o. 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 --Start on 10/29/2022     Interim History:  Alexander Armstrong is back for follow-up.    Today he reports that he is doing well. He has no concerns at this time  His monoclonal studies would last saw him at the end of Sept did not show a M-spike. Light chains were essentially normal (lambda light chains were down slightly by 0.2 mg/L. IgG/IgM/IgA levels were not elevated.   He is responding fairly well to the Aranesp.  His hemoglobin is come up quite nicely.  He has had no problems with pain.  He has had no problems with nausea or vomiting.  He has had no bleeding.  He has had no leg swelling.  His appetite has been doing quite good.  Overall, I would say his performance status is probably ECOG 1.   Wt Readings from Last 3 Encounters:  08/12/23 201 lb (91.2 kg)  07/20/23 198 lb 12.8 oz (90.2 kg)  07/01/23 196 lb (88.9 kg)    Medications:  Current Outpatient Medications:    acetaminophen (TYLENOL) 500 MG tablet, Take 500 mg by mouth 3 (three) times daily as needed for moderate pain (pain score 4-6) or headache., Disp: , Rfl:    Cholecalciferol (VITAMIN D3) 1000 units CAPS, Take 1,000 Units by mouth at bedtime., Disp: , Rfl:    clopidogrel (PLAVIX) 75 MG tablet, TAKE 1 TABLET(75 MG) BY MOUTH DAILY (Patient taking differently: Take 75 mg by mouth in the morning.), Disp: 90 tablet, Rfl: 2   Cyanocobalamin 1000 MCG TBCR, Take 1,000 mcg by mouth daily., Disp: , Rfl:    dexamethasone (DECADRON) 4 MG tablet, Take 5 tabs (20 mg) weekly the day of  daratumumab. Take with breakfast. (Patient taking differently: Take 20 mg by mouth See admin instructions. Take 20 mg with breakfast by mouth one hour prior to Daratumumab infusion on Mondays), Disp: 60 tablet, Rfl: 5   diltiazem (TIAZAC) 120 MG 24 hr capsule, Take 1 capsule (120 mg total) by mouth daily. (Patient not taking: Reported on 07/29/2023), Disp: 90 capsule, Rfl: 3   diphenhydrAMINE (BENADRYL ALLERGY) 25 MG tablet, Take 50 mg by mouth See admin instructions. Take 50 mg by mouth one hour prior to Daratumumab infusion on Mondays, Disp: , Rfl:    Docusate Sodium (DSS) 100 MG CAPS, Take by mouth daily., Disp: , Rfl:    famciclovir (FAMVIR) 250 MG tablet, TAKE 1 TABLET BY MOUTH EVERY DAY, Disp: 90 tablet, Rfl: 2   fluticasone (FLONASE) 50 MCG/ACT nasal spray, Place 1 spray into both nostrils daily as needed for allergies or rhinitis., Disp: , Rfl:    lenalidomide (REVLIMID) 15 MG capsule, Take 1 capsule (15 mg total) by mouth daily. [employer] Harvey #[id number], Disp: 21 capsule, Rfl: 0   loratadine (CLARITIN) 10 MG tablet, Take 10 mg by mouth in the morning., Disp: , Rfl:    MAGNESIUM PO, Take by mouth daily., Disp: , Rfl:    montelukast (SINGULAIR) 10 MG tablet, TAKE 1 TABLET BY MOUTH EVERYDAY AT BEDTIME, Disp: 90 tablet, Rfl: 2   pantoprazole (PROTONIX) 40 MG tablet, Take 1 tablet (40 mg total)  by mouth daily. (Patient taking differently: Take 40 mg by mouth daily before breakfast.), Disp: 90 tablet, Rfl: 3   polyethylene glycol (MIRALAX / GLYCOLAX) 17 g packet, Take 17 g by mouth daily., Disp: , Rfl:    REFRESH PLUS 0.5 % SOLN, Place 1 drop into both eyes 3 (three) times daily as needed (for dryness)., Disp: , Rfl:    rosuvastatin (CRESTOR) 20 MG tablet, TAKE 1 TABLET BY MOUTH EVERY DAY, Disp: 90 tablet, Rfl: 3   tamsulosin (FLOMAX) 0.4 MG CAPS capsule, Take 1 capsule (0.4 mg total) by mouth at bedtime., Disp: 90 capsule, Rfl: 3  Allergies:  Allergies  Allergen Reactions   Latex Other (See  Comments)    "takes the skin off" (tape)    Past Medical History, Surgical history, Social history, and Family History were reviewed and updated.  Review of Systems: Review of Systems  Constitutional: Negative.   HENT:  Negative.    Eyes: Negative.   Respiratory: Negative.    Cardiovascular: Negative.   Gastrointestinal: Negative.   Endocrine: Negative.   Genitourinary: Negative.    Musculoskeletal: Negative.   Skin: Negative.   Neurological: Negative.   Hematological: Negative.   Psychiatric/Behavioral: Negative.      Physical Exam:  vitals were not taken for this visit.   Wt Readings from Last 3 Encounters:  07/20/23 198 lb 12.8 oz (90.2 kg)  07/01/23 196 lb (88.9 kg)  05/12/23 194 lb 1.9 oz (88.1 kg)    Physical Exam Vitals reviewed.  HENT:     Head: Normocephalic and atraumatic.  Eyes:     Pupils: Pupils are equal, round, and reactive to light.  Cardiovascular:     Rate and Rhythm: Normal rate and regular rhythm.     Heart sounds: Normal heart sounds.  Pulmonary:     Effort: Pulmonary effort is normal.     Breath sounds: Normal breath sounds.  Abdominal:     General: Bowel sounds are normal.     Palpations: Abdomen is soft.     Comments: Abdominal exam is somewhat distended.  The umbilical hernia is reducible.  He has active bowel sounds.  There is no guarding or rebound tenderness.  He has no palpable liver or spleen tip.  There is no fluid wave.    Musculoskeletal:        General: No tenderness or deformity. Normal range of motion.     Cervical back: Normal range of motion.  Lymphadenopathy:     Cervical: No cervical adenopathy.  Skin:    General: Skin is warm and dry.     Findings: No erythema or rash.  Neurological:     Mental Status: He is alert and oriented to person, place, and time.  Psychiatric:        Behavior: Behavior normal.        Thought Content: Thought content normal.        Judgment: Judgment normal.     Lab Results  Component  Value Date   WBC 9.4 08/12/2023   HGB 11.8 (L) 08/12/2023   HCT 34.0 (L) 08/12/2023   MCV 98.8 08/12/2023   PLT 139 (L) 08/12/2023     Chemistry      Component Value Date/Time   NA 140 07/29/2023 0930   K 4.3 07/29/2023 0930   CL 104 07/29/2023 0930   CO2 30 07/29/2023 0930   BUN 25 (H) 07/29/2023 0930   CREATININE 1.13 07/29/2023 0930   CREATININE 1.33 (H) 01/21/2017 1616  Component Value Date/Time   CALCIUM 9.3 07/29/2023 0930   ALKPHOS 62 07/29/2023 0930   AST 15 07/29/2023 0930   ALT 13 07/29/2023 0930   BILITOT 0.5 07/29/2023 0930     Encounter Diagnosis  Name Primary?   Multiple myeloma, remission status unspecified (HCC) Yes    Impression and Plan: Alexander Armstrong is a very nice 82 year old white male.  He has IgG kappa myeloma.  His chromosomal abnormalities resolved with treatment.   No ESA needed today given his HgB >11.  RTC 1 month MD/APP, labs (CBC w/, CMP, SPEP, LDH, Light chains, QID)-[place]  Rushie Chestnut, PA-C 11/7/202410:17 AM

## 2023-08-13 LAB — KAPPA/LAMBDA LIGHT CHAINS
Kappa free light chain: 10.6 mg/L (ref 3.3–19.4)
Kappa, lambda light chain ratio: 2.36 — ABNORMAL HIGH (ref 0.26–1.65)
Lambda free light chains: 4.5 mg/L — ABNORMAL LOW (ref 5.7–26.3)

## 2023-08-14 LAB — IGG, IGA, IGM
IgA: 20 mg/dL — ABNORMAL LOW (ref 61–437)
IgG (Immunoglobin G), Serum: 584 mg/dL — ABNORMAL LOW (ref 603–1613)
IgM (Immunoglobulin M), Srm: 6 mg/dL — ABNORMAL LOW (ref 15–143)

## 2023-08-16 LAB — PROTEIN ELECTROPHORESIS, SERUM, WITH REFLEX
A/G Ratio: 2 — ABNORMAL HIGH (ref 0.7–1.7)
Albumin ELP: 3.8 g/dL (ref 2.9–4.4)
Alpha-1-Globulin: 0.2 g/dL (ref 0.0–0.4)
Alpha-2-Globulin: 0.6 g/dL (ref 0.4–1.0)
Beta Globulin: 0.7 g/dL (ref 0.7–1.3)
Gamma Globulin: 0.4 g/dL (ref 0.4–1.8)
Globulin, Total: 1.9 g/dL — ABNORMAL LOW (ref 2.2–3.9)
Total Protein ELP: 5.7 g/dL — ABNORMAL LOW (ref 6.0–8.5)

## 2023-08-17 DIAGNOSIS — Z5112 Encounter for antineoplastic immunotherapy: Secondary | ICD-10-CM | POA: Diagnosis not present

## 2023-08-18 ENCOUNTER — Other Ambulatory Visit: Payer: Medicare Other

## 2023-08-18 DIAGNOSIS — C9000 Multiple myeloma not having achieved remission: Secondary | ICD-10-CM

## 2023-08-19 ENCOUNTER — Encounter: Payer: Self-pay | Admitting: Hematology & Oncology

## 2023-08-20 LAB — UPEP/UIFE/LIGHT CHAINS/TP, 24-HR UR
% BETA, Urine: 33.3 %
ALPHA 1 URINE: 6.9 %
Albumin, U: 19.6 %
Alpha 2, Urine: 25.8 %
Free Kappa Lt Chains,Ur: 13.26 mg/L (ref 1.17–86.46)
Free Kappa/Lambda Ratio: 5.89 (ref 1.83–14.26)
Free Lambda Lt Chains,Ur: 2.25 mg/L (ref 0.27–15.21)
GAMMA GLOBULIN URINE: 14.3 %
Total Protein, Urine-Ur/day: 138 mg/(24.h) (ref 30–150)
Total Protein, Urine: 7.1 mg/dL
Total Volume: 1950

## 2023-08-23 ENCOUNTER — Encounter: Payer: Self-pay | Admitting: *Deleted

## 2023-08-26 ENCOUNTER — Inpatient Hospital Stay: Payer: Medicare Other

## 2023-08-26 VITALS — BP 131/62 | HR 63 | Temp 97.3°F | Resp 18

## 2023-08-26 DIAGNOSIS — C9000 Multiple myeloma not having achieved remission: Secondary | ICD-10-CM

## 2023-08-26 DIAGNOSIS — Z5112 Encounter for antineoplastic immunotherapy: Secondary | ICD-10-CM | POA: Diagnosis not present

## 2023-08-26 LAB — CBC WITH DIFFERENTIAL (CANCER CENTER ONLY)
Abs Immature Granulocytes: 0.03 10*3/uL (ref 0.00–0.07)
Basophils Absolute: 0.1 10*3/uL (ref 0.0–0.1)
Basophils Relative: 1 %
Eosinophils Absolute: 0.1 10*3/uL (ref 0.0–0.5)
Eosinophils Relative: 1 %
HCT: 35.7 % — ABNORMAL LOW (ref 39.0–52.0)
Hemoglobin: 12.2 g/dL — ABNORMAL LOW (ref 13.0–17.0)
Immature Granulocytes: 0 %
Lymphocytes Relative: 20 %
Lymphs Abs: 2.1 10*3/uL (ref 0.7–4.0)
MCH: 34.1 pg — ABNORMAL HIGH (ref 26.0–34.0)
MCHC: 34.2 g/dL (ref 30.0–36.0)
MCV: 99.7 fL (ref 80.0–100.0)
Monocytes Absolute: 0.9 10*3/uL (ref 0.1–1.0)
Monocytes Relative: 8 %
Neutro Abs: 7.4 10*3/uL (ref 1.7–7.7)
Neutrophils Relative %: 70 %
Platelet Count: 142 10*3/uL — ABNORMAL LOW (ref 150–400)
RBC: 3.58 MIL/uL — ABNORMAL LOW (ref 4.22–5.81)
RDW: 12.2 % (ref 11.5–15.5)
WBC Count: 10.6 10*3/uL — ABNORMAL HIGH (ref 4.0–10.5)
nRBC: 0 % (ref 0.0–0.2)

## 2023-08-26 LAB — CMP (CANCER CENTER ONLY)
ALT: 14 U/L (ref 0–44)
AST: 15 U/L (ref 15–41)
Albumin: 4.1 g/dL (ref 3.5–5.0)
Alkaline Phosphatase: 60 U/L (ref 38–126)
Anion gap: 7 (ref 5–15)
BUN: 27 mg/dL — ABNORMAL HIGH (ref 8–23)
CO2: 27 mmol/L (ref 22–32)
Calcium: 9.6 mg/dL (ref 8.9–10.3)
Chloride: 105 mmol/L (ref 98–111)
Creatinine: 0.98 mg/dL (ref 0.61–1.24)
GFR, Estimated: >60 mL/min (ref >60)
Glucose, Bld: 180 mg/dL — ABNORMAL HIGH (ref 70–99)
Potassium: 3.8 mmol/L (ref 3.5–5.1)
Sodium: 139 mmol/L (ref 135–145)
Total Bilirubin: 0.5 mg/dL (ref <1.2)
Total Protein: 6.2 g/dL — ABNORMAL LOW (ref 6.5–8.1)

## 2023-08-26 MED ORDER — DARATUMUMAB-HYALURONIDASE-FIHJ 1800-30000 MG-UT/15ML ~~LOC~~ SOLN
1800.0000 mg | Freq: Once | SUBCUTANEOUS | Status: AC
Start: 2023-08-26 — End: 2023-08-26
  Administered 2023-08-26: 1800 mg via SUBCUTANEOUS
  Filled 2023-08-26: qty 15

## 2023-08-26 MED ORDER — BORTEZOMIB CHEMO SQ INJECTION 3.5 MG (2.5MG/ML)
1.3000 mg/m2 | Freq: Once | INTRAMUSCULAR | Status: AC
Start: 2023-08-26 — End: 2023-08-26
  Administered 2023-08-26: 2.75 mg via SUBCUTANEOUS
  Filled 2023-08-26: qty 1.1

## 2023-08-26 MED ORDER — DIPHENHYDRAMINE HCL 25 MG PO CAPS
50.0000 mg | ORAL_CAPSULE | Freq: Once | ORAL | Status: AC
  Administered 2023-08-26: 50 mg via ORAL
  Filled 2023-08-26: qty 2

## 2023-08-26 MED ORDER — ACETAMINOPHEN 325 MG PO TABS
650.0000 mg | ORAL_TABLET | Freq: Once | ORAL | Status: AC
  Administered 2023-08-26: 650 mg via ORAL
  Filled 2023-08-26: qty 2

## 2023-08-26 NOTE — Patient Instructions (Signed)
 [facility] - A DEPT OF [facility]  Discharge Instructions: Thank you for choosing [facility] to provide your oncology and hematology care.   If you have a lab appointment with the Cancer Center, please go directly to the Cancer Center and check in at the registration area.  Wear comfortable clothing and clothing appropriate for easy access to any Portacath or PICC line.   We strive to give you quality time with your provider. You may need to reschedule your appointment if you arrive late (15 or more minutes).  Arriving late affects you and other patients whose appointments are after yours.  Also, if you miss three or more appointments without notifying the office, you may be dismissed from the clinic at the provider's discretion.      For prescription refill requests, have your pharmacy contact our office and allow 72 hours for refills to be completed.    Today you received the following chemotherapy and/or immunotherapy agents Velcade/Darzalex      To help prevent nausea and vomiting after your treatment, we encourage you to take your nausea medication as directed.  BELOW ARE SYMPTOMS THAT SHOULD BE REPORTED IMMEDIATELY: *FEVER GREATER THAN 100.4 F (38 C) OR HIGHER *CHILLS OR SWEATING *NAUSEA AND VOMITING THAT IS NOT CONTROLLED WITH YOUR NAUSEA MEDICATION *UNUSUAL SHORTNESS OF BREATH *UNUSUAL BRUISING OR BLEEDING *URINARY PROBLEMS (pain or burning when urinating, or frequent urination) *BOWEL PROBLEMS (unusual diarrhea, constipation, pain near the anus) TENDERNESS IN MOUTH AND THROAT WITH OR WITHOUT PRESENCE OF ULCERS (sore throat, sores in mouth, or a toothache) UNUSUAL RASH, SWELLING OR PAIN  UNUSUAL VAGINAL DISCHARGE OR ITCHING   Items with * indicate a potential emergency and should be followed up as soon as possible or go to the Emergency Department if any problems should occur.  Please show the CHEMOTHERAPY ALERT CARD or  IMMUNOTHERAPY ALERT CARD at check-in to the Emergency Department and triage nurse. Should you have questions after your visit or need to cancel or reschedule your appointment, please contact [facility] - A DEPT OF [facility]  [phone] and follow the prompts.  Office hours are 8:00 a.m. to 4:30 p.m. Monday - Friday. Please note that voicemails left after 4:00 p.m. may not be returned until the following business day.  We are closed weekends and major holidays. You have access to a nurse at all times for urgent questions. Please call the main number to the clinic [phone] and follow the prompts.  For any non-urgent questions, you may also contact your provider using MyChart. We now offer e-Visits for anyone 64 and older to request care online for non-urgent symptoms. For details visit [email or web address].   Also download the MyChart app! Go to the app store, search "MyChart", open the app, select , and log in with your MyChart username and password.

## 2023-09-02 ENCOUNTER — Other Ambulatory Visit: Payer: Self-pay | Admitting: Family Medicine

## 2023-09-09 ENCOUNTER — Inpatient Hospital Stay: Payer: Medicare Other

## 2023-09-09 ENCOUNTER — Other Ambulatory Visit: Payer: Self-pay

## 2023-09-09 ENCOUNTER — Encounter: Payer: Self-pay | Admitting: Hematology & Oncology

## 2023-09-09 ENCOUNTER — Inpatient Hospital Stay: Payer: Medicare Other | Attending: Hematology & Oncology | Admitting: Hematology & Oncology

## 2023-09-09 VITALS — BP 95/74 | HR 69 | Resp 17

## 2023-09-09 VITALS — BP 117/66 | HR 69 | Temp 97.7°F | Resp 18 | Ht 70.0 in | Wt 201.0 lb

## 2023-09-09 DIAGNOSIS — D630 Anemia in neoplastic disease: Secondary | ICD-10-CM | POA: Diagnosis not present

## 2023-09-09 DIAGNOSIS — C9000 Multiple myeloma not having achieved remission: Secondary | ICD-10-CM | POA: Insufficient documentation

## 2023-09-09 DIAGNOSIS — Z5112 Encounter for antineoplastic immunotherapy: Secondary | ICD-10-CM | POA: Diagnosis present

## 2023-09-09 LAB — CBC WITH DIFFERENTIAL (CANCER CENTER ONLY)
Abs Immature Granulocytes: 0.02 10*3/uL (ref 0.00–0.07)
Basophils Absolute: 0.1 10*3/uL (ref 0.0–0.1)
Basophils Relative: 1 %
Eosinophils Absolute: 0.1 10*3/uL (ref 0.0–0.5)
Eosinophils Relative: 2 %
HCT: 34.9 % — ABNORMAL LOW (ref 39.0–52.0)
Hemoglobin: 11.9 g/dL — ABNORMAL LOW (ref 13.0–17.0)
Immature Granulocytes: 0 %
Lymphocytes Relative: 22 %
Lymphs Abs: 1.7 10*3/uL (ref 0.7–4.0)
MCH: 33.6 pg (ref 26.0–34.0)
MCHC: 34.1 g/dL (ref 30.0–36.0)
MCV: 98.6 fL (ref 80.0–100.0)
Monocytes Absolute: 0.9 10*3/uL (ref 0.1–1.0)
Monocytes Relative: 12 %
Neutro Abs: 5.1 10*3/uL (ref 1.7–7.7)
Neutrophils Relative %: 63 %
Platelet Count: 154 10*3/uL (ref 150–400)
RBC: 3.54 MIL/uL — ABNORMAL LOW (ref 4.22–5.81)
RDW: 12.2 % (ref 11.5–15.5)
WBC Count: 7.9 10*3/uL (ref 4.0–10.5)
nRBC: 0 % (ref 0.0–0.2)

## 2023-09-09 LAB — CMP (CANCER CENTER ONLY)
ALT: 14 U/L (ref 0–44)
AST: 16 U/L (ref 15–41)
Albumin: 4.2 g/dL (ref 3.5–5.0)
Alkaline Phosphatase: 61 U/L (ref 38–126)
Anion gap: 9 (ref 5–15)
BUN: 28 mg/dL — ABNORMAL HIGH (ref 8–23)
CO2: 28 mmol/L (ref 22–32)
Calcium: 9.6 mg/dL (ref 8.9–10.3)
Chloride: 104 mmol/L (ref 98–111)
Creatinine: 1.14 mg/dL (ref 0.61–1.24)
GFR, Estimated: >60 mL/min (ref >60)
Glucose, Bld: 164 mg/dL — ABNORMAL HIGH (ref 70–99)
Potassium: 4.2 mmol/L (ref 3.5–5.1)
Sodium: 141 mmol/L (ref 135–145)
Total Bilirubin: 0.6 mg/dL (ref <1.2)
Total Protein: 6.4 g/dL — ABNORMAL LOW (ref 6.5–8.1)

## 2023-09-09 LAB — LACTATE DEHYDROGENASE: LDH: 140 U/L (ref 98–192)

## 2023-09-09 MED ORDER — BORTEZOMIB CHEMO SQ INJECTION 3.5 MG (2.5MG/ML)
1.3000 mg/m2 | Freq: Once | INTRAMUSCULAR | Status: AC
  Administered 2023-09-09: 2.75 mg via SUBCUTANEOUS
  Filled 2023-09-09: qty 1.1

## 2023-09-09 MED ORDER — PROCHLORPERAZINE MALEATE 10 MG PO TABS
10.0000 mg | ORAL_TABLET | Freq: Once | ORAL | Status: DC
Start: 2023-09-09 — End: 2023-09-09

## 2023-09-09 NOTE — Progress Notes (Signed)
Hematology and Oncology Follow Up Visit  Alexander Armstrong [id number] [birth year] 82 y.o. 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 --Start on 10/29/2022     Interim History:  Alexander Armstrong is back for follow-up.  He looks quite good.  He feels well.  He really has had no complaints since we saw him.  He has had a little bit of flank discomfort overall on the right side.  When we last checked his monoclonal studies back in November, there was no monoclonal spike in his blood.  His IgG level was 584 mg/dL.  The Kappa light chain was 1.1 mg/dL.  His appetite has been good.  Had a very nice Thanksgiving.  His wife is busy making cakes and pies for the big church bazarre this weekend.  He has had no fever.  He has had no change in bowel or bladder habits.  He has had no rashes.  There is been no leg swelling.  He has had no cough or shortness of breath.  Overall, I would say his performance status is probably ECOG 1.   Medications:  Current Outpatient Medications:    acetaminophen (TYLENOL) 500 MG tablet, Take 500 mg by mouth 3 (three) times daily as needed for moderate pain (pain score 4-6) or headache., Disp: , Rfl:    Cholecalciferol (VITAMIN D3) 1000 units CAPS, Take 1,000 Units by mouth at bedtime., Disp: , Rfl:    clopidogrel (PLAVIX) 75 MG tablet, TAKE 1 TABLET(75 MG) BY MOUTH DAILY (Patient taking differently: Take 75 mg by mouth in the morning.), Disp: 90 tablet, Rfl: 2   Cyanocobalamin 1000 MCG TBCR, Take 1,000 mcg by mouth daily., Disp: , Rfl:    dexamethasone (DECADRON) 4 MG tablet, Take 5 tabs (20 mg) weekly the day of daratumumab. Take with breakfast. (Patient taking  differently: Take 20 mg by mouth See admin instructions. Take 20 mg with breakfast by mouth one hour prior to Daratumumab infusion on Mondays), Disp: 60 tablet, Rfl: 5   diltiazem (TIAZAC) 120 MG 24 hr capsule, Take 1 capsule (120 mg total) by mouth daily., Disp: 90 capsule, Rfl: 3   diphenhydrAMINE (BENADRYL ALLERGY) 25 MG tablet, Take 50 mg by mouth See admin instructions. Take 50 mg by mouth one hour prior to Daratumumab infusion on Mondays, Disp: , Rfl:    Docusate Sodium (DSS) 100 MG CAPS, Take by mouth daily., Disp: , Rfl:    famciclovir (FAMVIR) 250 MG tablet, TAKE 1 TABLET BY MOUTH EVERY DAY, Disp: 90 tablet, Rfl: 2   fluticasone (FLONASE) 50 MCG/ACT nasal spray, Place 1 spray into both nostrils daily as needed for allergies or rhinitis., Disp: , Rfl:    lenalidomide (REVLIMID) 15 MG capsule, Take 1 capsule (15 mg total) by mouth daily. [employer] [id number] (Patient not taking: Reported on 08/12/2023), Disp: 21 capsule, Rfl: 0   loratadine (CLARITIN) 10 MG tablet, Take 10 mg by mouth in the morning., Disp: , Rfl:    MAGNESIUM PO, Take by mouth daily., Disp: , Rfl:    montelukast (SINGULAIR) 10 MG tablet, TAKE 1 TABLET BY MOUTH EVERYDAY AT BEDTIME, Disp: 90 tablet, Rfl: 2   pantoprazole (PROTONIX) 40 MG tablet, TAKE 1 TABLET BY MOUTH EVERY DAY, Disp: 90 tablet,  Rfl: 3   polyethylene glycol (MIRALAX / GLYCOLAX) 17 g packet, Take 17 g by mouth daily as needed., Disp: , Rfl:    REFRESH PLUS 0.5 % SOLN, Place 1 drop into both eyes 3 (three) times daily as needed (for dryness)., Disp: , Rfl:    rosuvastatin (CRESTOR) 20 MG tablet, TAKE 1 TABLET BY MOUTH EVERY DAY, Disp: 90 tablet, Rfl: 3   tamsulosin (FLOMAX) 0.4 MG CAPS capsule, Take 1 capsule (0.4 mg total) by mouth at bedtime., Disp: 90 capsule, Rfl: 3  Allergies:  Allergies  Allergen Reactions   Latex Other (See Comments)    "takes the skin off" (tape)    Past Medical History, Surgical history, Social history, and Family History were  reviewed and updated.  Review of Systems: Review of Systems  Constitutional: Negative.   HENT:  Negative.    Eyes: Negative.   Respiratory: Negative.    Cardiovascular: Negative.   Gastrointestinal: Negative.   Endocrine: Negative.   Genitourinary: Negative.    Musculoskeletal: Negative.   Skin: Negative.   Neurological: Negative.   Hematological: Negative.   Psychiatric/Behavioral: Negative.      Physical Exam:  height is 5\' 10"  (1.778 m) and weight is 201 lb (91.2 kg). His oral temperature is 97.7 F (36.5 C). His blood pressure is 117/66 and his pulse is 69. His respiration is 18 and oxygen saturation is 99%.   Wt Readings from Last 3 Encounters:  09/09/23 201 lb (91.2 kg)  08/12/23 201 lb (91.2 kg)  07/20/23 198 lb 12.8 oz (90.2 kg)    Physical Exam Vitals reviewed.  HENT:     Head: Normocephalic and atraumatic.  Eyes:     Pupils: Pupils are equal, round, and reactive to light.  Cardiovascular:     Rate and Rhythm: Normal rate and regular rhythm.     Heart sounds: Normal heart sounds.  Pulmonary:     Effort: Pulmonary effort is normal.     Breath sounds: Normal breath sounds.  Abdominal:     General: Bowel sounds are normal.     Palpations: Abdomen is soft.     Comments: Abdominal exam is somewhat distended.  The umbilical hernia is reducible.  He has active bowel sounds.  There is no guarding or rebound tenderness.  He has no palpable liver or spleen tip.  There is no fluid wave.    Musculoskeletal:        General: No tenderness or deformity. Normal range of motion.     Cervical back: Normal range of motion.  Lymphadenopathy:     Cervical: No cervical adenopathy.  Skin:    General: Skin is warm and dry.     Findings: No erythema or rash.  Neurological:     Mental Status: He is alert and oriented to person, place, and time.  Psychiatric:        Behavior: Behavior normal.        Thought Content: Thought content normal.        Judgment: Judgment normal.     Lab Results  Component Value Date   WBC 7.9 09/09/2023   HGB 11.9 (L) 09/09/2023   HCT 34.9 (L) 09/09/2023   MCV 98.6 09/09/2023   PLT 154 09/09/2023     Chemistry      Component Value Date/Time   NA 141 09/09/2023 0911   K 4.2 09/09/2023 0911   CL 104 09/09/2023 0911   CO2 28 09/09/2023 0911   BUN 28 (H) 09/09/2023 0911  CREATININE 1.14 09/09/2023 0911   CREATININE 1.33 (H) 01/21/2017 1616      Component Value Date/Time   CALCIUM 9.6 09/09/2023 0911   ALKPHOS 61 09/09/2023 0911   AST 16 09/09/2023 0911   ALT 14 09/09/2023 0911   BILITOT 0.6 09/09/2023 0911     Impression and Plan: Alexander Armstrong is a very nice 82 year old white male.  He has IgG kappa myeloma.  His chromosomal abnormalities resolved with treatment.   I still think is doing incredibly well.  Again, we have altered his treatment a little bit.  He certainly is not getting as aggressive therapy.  I think this is appropriate.  He does not need any ESA today.  He will start his next cycle of chemotherapy on 09/23/2023.  We probably can get him back to see [place] for his 14th cycle in January.   Josph Macho, MD 12/5/202410:24 AM

## 2023-09-09 NOTE — Patient Instructions (Signed)
[facility] - A DEPT OF [facility]  Discharge Instructions: Thank you for choosing [facility] to provide your oncology and hematology care.   If you have a lab appointment with the Cancer Center, please go directly to the Cancer Center and check in at the registration area.  Wear comfortable clothing and clothing appropriate for easy access to any Portacath or PICC line.   We strive to give you quality time with your provider. You may need to reschedule your appointment if you arrive late (15 or more minutes).  Arriving late affects you and other patients whose appointments are after yours.  Also, if you miss three or more appointments without notifying the office, you may be dismissed from the clinic at the provider's discretion.      For prescription refill requests, have your pharmacy contact our office and allow 72 hours for refills to be completed.    Today you received the following chemotherapy and/or immunotherapy agents velcade      To help prevent nausea and vomiting after your treatment, we encourage you to take your nausea medication as directed.  BELOW ARE SYMPTOMS THAT SHOULD BE REPORTED IMMEDIATELY: *FEVER GREATER THAN 100.4 F (38 C) OR HIGHER *CHILLS OR SWEATING *NAUSEA AND VOMITING THAT IS NOT CONTROLLED WITH YOUR NAUSEA MEDICATION *UNUSUAL SHORTNESS OF BREATH *UNUSUAL BRUISING OR BLEEDING *URINARY PROBLEMS (pain or burning when urinating, or frequent urination) *BOWEL PROBLEMS (unusual diarrhea, constipation, pain near the anus) TENDERNESS IN MOUTH AND THROAT WITH OR WITHOUT PRESENCE OF ULCERS (sore throat, sores in mouth, or a toothache) UNUSUAL RASH, SWELLING OR PAIN  UNUSUAL VAGINAL DISCHARGE OR ITCHING   Items with * indicate a potential emergency and should be followed up as soon as possible or go to the Emergency Department if any problems should occur.  Please show the CHEMOTHERAPY ALERT CARD or IMMUNOTHERAPY  ALERT CARD at check-in to the Emergency Department and triage nurse. Should you have questions after your visit or need to cancel or reschedule your appointment, please contact [facility] - A DEPT OF [facility]  [phone] and follow the prompts.  Office hours are 8:00 a.m. to 4:30 p.m. Monday - Friday. Please note that voicemails left after 4:00 p.m. may not be returned until the following business day.  We are closed weekends and major holidays. You have access to a nurse at all times for urgent questions. Please call the main number to the clinic [phone] and follow the prompts.  For any non-urgent questions, you may also contact your provider using MyChart. We now offer e-Visits for anyone 30 and older to request care online for non-urgent symptoms. For details visit [email or web address].   Also download the MyChart app! Go to the app store, search "MyChart", open the app, select [place], and log in with your MyChart username and password.

## 2023-09-10 ENCOUNTER — Other Ambulatory Visit: Payer: Self-pay

## 2023-09-10 ENCOUNTER — Ambulatory Visit: Payer: Medicare Other | Admitting: Cardiology

## 2023-09-10 LAB — IGG, IGA, IGM
IgA: 18 mg/dL — ABNORMAL LOW (ref 61–437)
IgG (Immunoglobin G), Serum: 551 mg/dL — ABNORMAL LOW (ref 603–1613)
IgM (Immunoglobulin M), Srm: 6 mg/dL — ABNORMAL LOW (ref 15–143)

## 2023-09-10 LAB — KAPPA/LAMBDA LIGHT CHAINS
Kappa free light chain: 9.1 mg/L (ref 3.3–19.4)
Kappa, lambda light chain ratio: 2.22 — ABNORMAL HIGH (ref 0.26–1.65)
Lambda free light chains: 4.1 mg/L — ABNORMAL LOW (ref 5.7–26.3)

## 2023-09-14 ENCOUNTER — Other Ambulatory Visit: Payer: Self-pay | Admitting: Hematology & Oncology

## 2023-09-14 DIAGNOSIS — C9000 Multiple myeloma not having achieved remission: Secondary | ICD-10-CM

## 2023-09-14 LAB — PROTEIN ELECTROPHORESIS, SERUM
A/G Ratio: 1.9 — ABNORMAL HIGH (ref 0.7–1.7)
Albumin ELP: 3.9 g/dL (ref 2.9–4.4)
Alpha-1-Globulin: 0.2 g/dL (ref 0.0–0.4)
Alpha-2-Globulin: 0.7 g/dL (ref 0.4–1.0)
Beta Globulin: 0.8 g/dL (ref 0.7–1.3)
Gamma Globulin: 0.4 g/dL (ref 0.4–1.8)
Globulin, Total: 2.1 g/dL — ABNORMAL LOW (ref 2.2–3.9)
Total Protein ELP: 6 g/dL (ref 6.0–8.5)

## 2023-09-15 ENCOUNTER — Encounter: Payer: Self-pay | Admitting: Hematology & Oncology

## 2023-09-20 MED ORDER — CLOPIDOGREL BISULFATE 75 MG PO TABS: 75.0000 mg | ORAL_TABLET | Freq: Every day | ORAL | 3 refills | Status: DC

## 2023-09-22 ENCOUNTER — Emergency Department (HOSPITAL_BASED_OUTPATIENT_CLINIC_OR_DEPARTMENT_OTHER): Payer: Medicare Other

## 2023-09-22 ENCOUNTER — Emergency Department (HOSPITAL_BASED_OUTPATIENT_CLINIC_OR_DEPARTMENT_OTHER)
Admission: EM | Admit: 2023-09-22 | Discharge: 2023-09-22 | Disposition: A | Discharge status: Home / Self Care | Payer: Medicare Other | Attending: Emergency Medicine | Admitting: Emergency Medicine

## 2023-09-22 ENCOUNTER — Encounter (HOSPITAL_BASED_OUTPATIENT_CLINIC_OR_DEPARTMENT_OTHER): Payer: Self-pay

## 2023-09-22 DIAGNOSIS — Z8579 Personal history of other malignant neoplasms of lymphoid, hematopoietic and related tissues: Secondary | ICD-10-CM | POA: Diagnosis not present

## 2023-09-22 DIAGNOSIS — T148XXA Other injury of unspecified body region, initial encounter: Secondary | ICD-10-CM

## 2023-09-22 DIAGNOSIS — Z91040 Latex allergy status: Secondary | ICD-10-CM | POA: Diagnosis not present

## 2023-09-22 DIAGNOSIS — W19XXXA Unspecified fall, initial encounter: Secondary | ICD-10-CM | POA: Diagnosis not present

## 2023-09-22 DIAGNOSIS — Z7902 Long term (current) use of antithrombotics/antiplatelets: Secondary | ICD-10-CM | POA: Insufficient documentation

## 2023-09-22 DIAGNOSIS — S0083XA Contusion of other part of head, initial encounter: Secondary | ICD-10-CM | POA: Diagnosis not present

## 2023-09-22 DIAGNOSIS — R519 Headache, unspecified: Secondary | ICD-10-CM | POA: Diagnosis present

## 2023-09-22 MED ORDER — LIDOCAINE-EPINEPHRINE (PF) 2 %-1:200000 IJ SOLN
20.0000 mL | Freq: Once | INTRAMUSCULAR | Status: AC
  Administered 2023-09-22: 20 mL
  Filled 2023-09-22: qty 20

## 2023-09-22 MED ORDER — ACETAMINOPHEN 325 MG PO TABS
650.0000 mg | ORAL_TABLET | Freq: Once | ORAL | Status: AC
  Administered 2023-09-22: 650 mg via ORAL
  Filled 2023-09-22: qty 2

## 2023-09-22 NOTE — ED Triage Notes (Signed)
Pt states that he was in [place] and was leaving. States that he hit the door and it knocked him back. States he fell straight back and landed on the tile floor. Pt reports some dizziness and blurred vision in the right. Pt is on plavix.

## 2023-09-22 NOTE — ED Provider Notes (Signed)
[facility] Provider Note   CSN: [id number] Arrival date & time: 09/22/23  1412     History  Chief Complaint  Patient presents with   Alexander Armstrong is a 82 y.o. male with a history of multiple myeloma, STEMI, and cataracts who presents the ED today after a fall.  Patient reports he was leaving [place] and he went to push open the door but it did not open and the impact caused him to fall backwards.  He landed flat on his back and felt increased dizziness, compared to his baseline.  He denies loss of consciousness but is taking Plavix. Denies Eliquis, Warfarin, or Xarelto use. Patient endorses a hematoma at the back of his head, headache, and back pain.  Denies any new or worsening weakness in the extremities, vision changes, or slurred speech.  Patient states that he was able to drive back to the office after the incident but then ultimately decided come here for further evaluation.  No additional complaints or concerns at this time.    Home Medications Prior to Admission medications   Medication Sig Start Date End Date Taking? Authorizing Provider  acetaminophen (TYLENOL) 500 MG tablet Take 500 mg by mouth 3 (three) times daily as needed for moderate pain (pain score 4-6) or headache.   Yes [provider]  Cholecalciferol (VITAMIN D3) 1000 units CAPS Take 1,000 Units by mouth at bedtime.   Yes [provider]  clopidogrel (PLAVIX) 75 MG tablet Take 1 tablet (75 mg total) by mouth daily. TAKE 1 TABLET(75 MG) BY MOUTH DAILY 09/20/23  Yes Marykay Lex, MD  Cyanocobalamin 1000 MCG TBCR Take 1,000 mcg by mouth daily. 01/13/22  Yes [provider]  dexamethasone (DECADRON) 4 MG tablet TAKE 5 TABS (20 MG) WEEKLY THE DAY OF DARATUMUMAB. TAKE WITH BREAKFAST. 09/15/23  Yes Josph Macho, MD  diltiazem (TIAZAC) 120 MG 24 hr capsule Take 1 capsule (120 mg total) by mouth daily. 03/24/23  Yes Marykay Lex, MD   loratadine (CLARITIN) 10 MG tablet Take 10 mg by mouth in the morning.   Yes [provider]  montelukast (SINGULAIR) 10 MG tablet TAKE 1 TABLET BY MOUTH EVERYDAY AT BEDTIME 09/15/23  Yes Ennever, Rose Phi, MD  pantoprazole (PROTONIX) 40 MG tablet TAKE 1 TABLET BY MOUTH EVERY DAY 09/06/23  Yes Shelva Majestic, MD  polyethylene glycol (MIRALAX / GLYCOLAX) 17 g packet Take 17 g by mouth daily as needed.   Yes [provider]  rosuvastatin (CRESTOR) 20 MG tablet TAKE 1 TABLET BY MOUTH EVERY DAY 07/16/23  Yes Shelva Majestic, MD  tamsulosin (FLOMAX) 0.4 MG CAPS capsule Take 1 capsule (0.4 mg total) by mouth at bedtime. 04/01/23  Yes Shelva Majestic, MD  diphenhydrAMINE (BENADRYL ALLERGY) 25 MG tablet Take 50 mg by mouth See admin instructions. Take 50 mg by mouth one hour prior to Daratumumab infusion on Mondays    [provider]  Docusate Sodium (DSS) 100 MG CAPS Take by mouth daily. 09/23/22   [provider]  famciclovir (FAMVIR) 250 MG tablet TAKE 1 TABLET BY MOUTH EVERY DAY 02/25/23   Josph Macho, MD  fluticasone (FLONASE) 50 MCG/ACT nasal spray Place 1 spray into both nostrils daily as needed for allergies or rhinitis.    [provider]  lenalidomide (REVLIMID) 15 MG capsule Take 1 capsule (15 mg total) by mouth daily. [employer] Auth #[id number] Patient not taking:  Reported on 08/12/2023 04/30/23   Josph Macho, MD  MAGNESIUM PO Take by mouth daily.    [provider]  REFRESH PLUS 0.5 % SOLN Place 1 drop into both eyes 3 (three) times daily as needed (for dryness).    [provider]      Allergies    Latex    Review of Systems   Review of Systems  Neurological:  Positive for headaches.  All other systems reviewed and are negative.   Physical Exam Updated Vital Signs BP 137/84 (BP Location: Right Arm)   Pulse 61   Temp 98.2 F (36.8 C) (Oral)   Resp 18   Ht 5\' 11"  (1.803 m)   Wt 90.7 kg   SpO2 98%   BMI  27.89 kg/m  Physical Exam Vitals and nursing note reviewed.  Constitutional:      General: He is not in acute distress.    Appearance: Normal appearance.  HENT:     Head: Normocephalic.     Comments: Hematoma and small abrasion present at the right sided parietal region of her head    Mouth/Throat:     Mouth: Mucous membranes are moist.  Eyes:     Extraocular Movements: Extraocular movements intact.     Conjunctiva/sclera: Conjunctivae normal.     Pupils: Pupils are equal, round, and reactive to light.  Cardiovascular:     Rate and Rhythm: Normal rate and regular rhythm.     Pulses: Normal pulses.     Heart sounds: Normal heart sounds.  Pulmonary:     Effort: Pulmonary effort is normal.     Breath sounds: Normal breath sounds.  Abdominal:     Palpations: Abdomen is soft.     Tenderness: There is no abdominal tenderness.  Musculoskeletal:        General: Tenderness present. Normal range of motion.     Cervical back: Normal range of motion. No tenderness.     Comments: Tenderness present to palpation of the thoracic and lumbar spine without step-off or deformity.  Range of motion, strength, and sensation appreciated of upper and lower extremities bilaterally.  Skin:    General: Skin is warm and dry.     Findings: No rash.  Neurological:     General: No focal deficit present.     Mental Status: He is alert.     Sensory: No sensory deficit.     Motor: No weakness.  Psychiatric:        Mood and Affect: Mood normal.        Behavior: Behavior normal.    ED Results / Procedures / Treatments   Labs (all labs ordered are listed, but only abnormal results are displayed) Labs Reviewed - No data to display  EKG None  Radiology CT Lumbar Spine Wo Contrast Result Date: 09/22/2023 CLINICAL DATA:  Trauma.  Fall.  Low back pain. EXAM: CT THORACIC AND LUMBAR SPINE WITHOUT CONTRAST TECHNIQUE: Multidetector CT imaging of the thoracic and lumbar spine was performed without contrast.  Multiplanar CT image reconstructions were also generated. RADIATION DOSE REDUCTION: This exam was performed according to the departmental dose-optimization program which includes automated exposure control, adjustment of the mA and/or kV according to patient size and/or use of iterative reconstruction technique. COMPARISON:  CT abdomen and pelvis 09/21/2022. MRI lumbar spine 10/11/2021. FINDINGS: CT THORACIC SPINE FINDINGS Alignment: Mild lower thoracic levoscoliosis. Trace anterolisthesis of C7 on T1. Vertebrae: No acute fracture or suspicious osseous lesion. Scattered small Schmorl's nodes. Paraspinal and  other soft tissues: Aortic and coronary atherosclerosis. Small sliding hiatal hernia Disc levels: Mild-to-moderate thoracic spondylosis and upper thoracic facet arthrosis without evidence of high-grade stenosis. CT LUMBAR SPINE FINDINGS Segmentation: 5 lumbar type vertebrae. Alignment: Mild lumbar dextroscoliosis. Trace retrolisthesis of L2 on L3 and trace anterolisthesis of L3 on L4. Vertebrae: No acute fracture or suspicious osseous lesion. Unchanged L2 superior endplate Schmorl's node. Paraspinal and other soft tissues: Abdominal aortic atherosclerosis without aneurysm. Punctate nonobstructing right renal calculus. Disc levels: Mild diffuse lumbar disc degeneration. Advanced lumbar facet arthrosis, particularly severe on the left at L1-2, bilaterally at L3-4, and on the right at L4-5 and L5-S1. Moderate spinal stenosis at L3-4 and severe spinal stenosis at L4-5 due to disc bulging and posterior element hypertrophy. Moderate left neural foraminal stenosis at L3-4 and L4-5 and mild-to-moderate right neural foraminal stenosis at L5-S1. IMPRESSION: 1. No acute osseous abnormality in the thoracic or lumbar spine. 2. 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 3.  Aortic Atherosclerosis (ICD10-I70.0). Electronically Signed   By: Sebastian Ache M.D.   On: 09/22/2023 15:18   CT Thoracic Spine Wo Contrast Result Date: 09/22/2023 CLINICAL DATA:  Trauma.  Fall.  Low back pain. EXAM: CT THORACIC AND LUMBAR SPINE WITHOUT CONTRAST TECHNIQUE: Multidetector CT imaging of the thoracic and lumbar spine was performed without contrast. Multiplanar CT image reconstructions were also generated. RADIATION DOSE REDUCTION: This exam was performed according to the departmental dose-optimization program which includes automated exposure control, adjustment of the mA and/or kV according to patient size and/or use of iterative reconstruction technique. COMPARISON:  CT abdomen and pelvis 09/21/2022. MRI lumbar spine 10/11/2021. FINDINGS: CT THORACIC SPINE FINDINGS Alignment: Mild lower thoracic levoscoliosis. Trace anterolisthesis of C7 on T1. Vertebrae: No acute fracture or suspicious osseous lesion. Scattered small Schmorl's nodes. Paraspinal and other soft tissues: Aortic and coronary atherosclerosis. Small sliding hiatal hernia Disc levels: Mild-to-moderate thoracic spondylosis and upper thoracic facet arthrosis without evidence of high-grade stenosis. CT LUMBAR SPINE FINDINGS Segmentation: 5 lumbar type vertebrae. Alignment: Mild lumbar dextroscoliosis. Trace retrolisthesis of L2 on L3 and trace anterolisthesis of L3 on L4. Vertebrae: No acute fracture or suspicious osseous lesion. Unchanged L2 superior endplate Schmorl's node. Paraspinal and other soft tissues: Abdominal aortic atherosclerosis without aneurysm. Punctate nonobstructing right renal calculus. Disc levels: Mild diffuse lumbar disc degeneration. Advanced lumbar facet arthrosis, particularly severe on the left at L1-2, bilaterally at L3-4, and on the right at L4-5 and L5-S1. Moderate spinal stenosis at L3-4 and severe spinal stenosis at L4-5 due to disc bulging and posterior element hypertrophy. Moderate left neural foraminal stenosis at L3-4 and L4-5 and mild-to-moderate right neural foraminal  stenosis at L5-S1. IMPRESSION: 1. No acute osseous abnormality in the thoracic or lumbar spine. 2. 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 3.  Aortic Atherosclerosis (ICD10-I70.0). Electronically Signed   By: Sebastian Ache M.D.   On: 09/22/2023 15:18   CT Head Wo Contrast Result Date: 09/22/2023 CLINICAL DATA:  Fall backwards onto the floor, dizziness and blurred vision, neck pain EXAM: CT HEAD WITHOUT CONTRAST CT CERVICAL SPINE WITHOUT CONTRAST TECHNIQUE: Multidetector CT imaging of the head and cervical spine was performed following the standard protocol without intravenous contrast. Multiplanar CT image reconstructions of the cervical spine were also generated. RADIATION DOSE REDUCTION: This exam was performed according to the departmental dose-optimization program which includes automated exposure control, adjustment  of the mA and/or kV according to patient size and/or use of iterative reconstruction technique. COMPARISON:  None Available. FINDINGS: CT HEAD FINDINGS Brain: No evidence of acute infarct, hemorrhage, mass, mass effect, or midline shift. No hydrocephalus or extra-axial fluid collection. Vascular: No hyperdense vessel. Skull: Negative for fracture or focal lesion. Right parietal scalp hematoma. Sinuses/Orbits: No acute finding. Other: The mastoid air cells are well aerated. CT CERVICAL SPINE FINDINGS Alignment: No traumatic listhesis. Skull base and vertebrae: No acute fracture or suspicious osseous lesion. Soft tissues and spinal canal: No prevertebral fluid or swelling. No visible canal hematoma. Disc levels: Degenerative changes in the cervical spine.No high-grade spinal canal stenosis. Upper chest: No focal pulmonary opacity or pleural effusion. IMPRESSION: 1. No acute intracranial process. Right parietal scalp hematoma. 2. No acute fracture or traumatic listhesis in the cervical spine. Electronically Signed    By: Wiliam Ke M.D.   On: 09/22/2023 15:11   CT Cervical Spine Wo Contrast Result Date: 09/22/2023 CLINICAL DATA:  Fall backwards onto the floor, dizziness and blurred vision, neck pain EXAM: CT HEAD WITHOUT CONTRAST CT CERVICAL SPINE WITHOUT CONTRAST TECHNIQUE: Multidetector CT imaging of the head and cervical spine was performed following the standard protocol without intravenous contrast. Multiplanar CT image reconstructions of the cervical spine were also generated. RADIATION DOSE REDUCTION: This exam was performed according to the departmental dose-optimization program which includes automated exposure control, adjustment of the mA and/or kV according to patient size and/or use of iterative reconstruction technique. COMPARISON:  None Available. FINDINGS: CT HEAD FINDINGS Brain: No evidence of acute infarct, hemorrhage, mass, mass effect, or midline shift. No hydrocephalus or extra-axial fluid collection. Vascular: No hyperdense vessel. Skull: Negative for fracture or focal lesion. Right parietal scalp hematoma. Sinuses/Orbits: No acute finding. Other: The mastoid air cells are well aerated. CT CERVICAL SPINE FINDINGS Alignment: No traumatic listhesis. Skull base and vertebrae: No acute fracture or suspicious osseous lesion. Soft tissues and spinal canal: No prevertebral fluid or swelling. No visible canal hematoma. Disc levels: Degenerative changes in the cervical spine.No high-grade spinal canal stenosis. Upper chest: No focal pulmonary opacity or pleural effusion. IMPRESSION: 1. No acute intracranial process. Right parietal scalp hematoma. 2. No acute fracture or traumatic listhesis in the cervical spine. Electronically Signed   By: Wiliam Ke M.D.   On: 09/22/2023 15:11    Procedures Procedures: not indicated.   Medications Ordered in ED Medications  acetaminophen (TYLENOL) tablet 650 mg (650 mg Oral Given 09/22/23 1454)  lidocaine-EPINEPHrine (XYLOCAINE W/EPI) 2 %-1:200000 (PF) injection  20 mL (20 mLs Infiltration Given by Other 09/22/23 1455)    ED Course/ Medical Decision Making/ A&P                                 Medical Decision Making  This patient presents to the ED for concern of fall, this involves an extensive number of treatment options, and is a complaint that carries with it a high risk of complications and morbidity.   Differential diagnosis includes: SAH, SDH, concussion, spinal fracture, misalignment, contusion, laceration, abrasion, etc.   Comorbidities  See HPI above   Additional History  Additional history obtained from prior records.   Imaging Studies  I ordered imaging studies including CT head, cervical, thoracic, and lumbar spine  I independently visualized and interpreted imaging which showed:  No acute intracranial process.  Right parietal scalp hematoma. No acute fracture or traumatic listhesis of the  cervical spine. No acute osseous abnormality in the thoracic or lumbar spine. I agree with the radiologist interpretation   Problem List / ED Course / Critical Interventions / Medication Management  Fall I ordered medications including: Tylenol for headache Reevaluation of the patient after these medicines showed that the patient improved.  Patient states that when his headache improved his dizziness did as well.  He states that he has disequilibrium at baseline and I have reviewed the patients home medicines and have made adjustments as needed   Social Determinants of Health  Physical activity   Test / Admission - Considered  Discussed findings with patient and wife at bedside.  All questions were answered. Patient is hemodynamically stable and safe for discharge home.  Close primary care follow-up recommended. Return precautions provided.       Final Clinical Impression(s) / ED Diagnoses Final diagnoses:  Fall, initial encounter  Hematoma    Rx / DC Orders ED Discharge Orders     None         Maxwell Marion, PA-C 09/22/23 1553    Sloan Leiter, DO 09/22/23 1615

## 2023-09-22 NOTE — ED Notes (Signed)
Patient transported to CT 

## 2023-09-22 NOTE — Discharge Instructions (Addendum)
As discussed, your head imaging is reassuring. There are no skull fractures or brain bleeds. There are no signs of acute fractures or misalignments of the vertebrae.  Take Tylenol every 6-8 hours as needed for headaches or back pain.  Follow-up with your primary care provider in the next 3 days for reevaluation of your symptoms.  Return to the ED if: you develop slurred speech, vision changes, new/increased weakness, vomiting, or any worsening or worrisome symptoms.

## 2023-09-23 ENCOUNTER — Inpatient Hospital Stay: Payer: Medicare Other

## 2023-09-23 ENCOUNTER — Encounter: Payer: Self-pay | Admitting: Hematology & Oncology

## 2023-09-23 VITALS — BP 110/69 | Temp 97.6°F | Resp 19

## 2023-09-23 DIAGNOSIS — Z5112 Encounter for antineoplastic immunotherapy: Secondary | ICD-10-CM | POA: Diagnosis not present

## 2023-09-23 DIAGNOSIS — C9000 Multiple myeloma not having achieved remission: Secondary | ICD-10-CM

## 2023-09-23 LAB — CBC WITH DIFFERENTIAL (CANCER CENTER ONLY)
Abs Immature Granulocytes: 0.04 10*3/uL (ref 0.00–0.07)
Basophils Absolute: 0 10*3/uL (ref 0.0–0.1)
Basophils Relative: 0 %
Eosinophils Absolute: 0.1 10*3/uL (ref 0.0–0.5)
Eosinophils Relative: 1 %
HCT: 36.2 % — ABNORMAL LOW (ref 39.0–52.0)
Hemoglobin: 12.2 g/dL — ABNORMAL LOW (ref 13.0–17.0)
Immature Granulocytes: 0 %
Lymphocytes Relative: 16 %
Lymphs Abs: 1.6 10*3/uL (ref 0.7–4.0)
MCH: 33.5 pg (ref 26.0–34.0)
MCHC: 33.7 g/dL (ref 30.0–36.0)
MCV: 99.5 fL (ref 80.0–100.0)
Monocytes Absolute: 0.9 10*3/uL (ref 0.1–1.0)
Monocytes Relative: 9 %
Neutro Abs: 7.4 10*3/uL (ref 1.7–7.7)
Neutrophils Relative %: 74 %
Platelet Count: 136 10*3/uL — ABNORMAL LOW (ref 150–400)
RBC: 3.64 MIL/uL — ABNORMAL LOW (ref 4.22–5.81)
RDW: 12.1 % (ref 11.5–15.5)
WBC Count: 10.2 10*3/uL (ref 4.0–10.5)
nRBC: 0 % (ref 0.0–0.2)

## 2023-09-23 LAB — CMP (CANCER CENTER ONLY)
ALT: 16 U/L (ref 0–44)
AST: 17 U/L (ref 15–41)
Albumin: 4.2 g/dL (ref 3.5–5.0)
Alkaline Phosphatase: 72 U/L (ref 38–126)
Anion gap: 6 (ref 5–15)
BUN: 23 mg/dL (ref 8–23)
CO2: 30 mmol/L (ref 22–32)
Calcium: 9.8 mg/dL (ref 8.9–10.3)
Chloride: 105 mmol/L (ref 98–111)
Creatinine: 1.18 mg/dL (ref 0.61–1.24)
GFR, Estimated: >60 mL/min (ref >60)
Glucose, Bld: 215 mg/dL — ABNORMAL HIGH (ref 70–99)
Potassium: 5.2 mmol/L — ABNORMAL HIGH (ref 3.5–5.1)
Sodium: 141 mmol/L (ref 135–145)
Total Bilirubin: 0.5 mg/dL (ref <1.2)
Total Protein: 6.2 g/dL — ABNORMAL LOW (ref 6.5–8.1)

## 2023-09-23 MED ORDER — BORTEZOMIB CHEMO SQ INJECTION 3.5 MG (2.5MG/ML)
1.3000 mg/m2 | Freq: Once | INTRAMUSCULAR | Status: AC
Start: 2023-09-23 — End: 2023-09-23
  Administered 2023-09-23: 2.75 mg via SUBCUTANEOUS
  Filled 2023-09-23: qty 1.1

## 2023-09-23 MED ORDER — DIPHENHYDRAMINE HCL 25 MG PO CAPS
50.0000 mg | ORAL_CAPSULE | Freq: Once | ORAL | Status: AC
Start: 2023-09-23 — End: 2023-09-23
  Administered 2023-09-23: 50 mg via ORAL
  Filled 2023-09-23: qty 2

## 2023-09-23 MED ORDER — DARATUMUMAB-HYALURONIDASE-FIHJ 1800-30000 MG-UT/15ML ~~LOC~~ SOLN
1800.0000 mg | Freq: Once | SUBCUTANEOUS | Status: AC
  Administered 2023-09-23: 1800 mg via SUBCUTANEOUS
  Filled 2023-09-23: qty 15

## 2023-09-23 MED ORDER — ACETAMINOPHEN 325 MG PO TABS
650.0000 mg | ORAL_TABLET | Freq: Once | ORAL | Status: AC
  Administered 2023-09-23: 650 mg via ORAL
  Filled 2023-09-23: qty 2

## 2023-09-23 NOTE — Patient Instructions (Signed)
[facility] - A DEPT OF [facility]  Discharge Instructions: Thank you for choosing [facility] to provide your oncology and hematology care.   If you have a lab appointment with the Cancer Center, please go directly to the Cancer Center and check in at the registration area.  Wear comfortable clothing and clothing appropriate for easy access to any Portacath or PICC line.   We strive to give you quality time with your provider. You may need to reschedule your appointment if you arrive late (15 or more minutes).  Arriving late affects you and other patients whose appointments are after yours.  Also, if you miss three or more appointments without notifying the office, you may be dismissed from the clinic at the provider's discretion.      For prescription refill requests, have your pharmacy contact our office and allow 72 hours for refills to be completed.    Today you received the following chemotherapy and/or immunotherapy agents: Velcade and Faspro      To help prevent nausea and vomiting after your treatment, we encourage you to take your nausea medication as directed.  BELOW ARE SYMPTOMS THAT SHOULD BE REPORTED IMMEDIATELY: *FEVER GREATER THAN 100.4 F (38 C) OR HIGHER *CHILLS OR SWEATING *NAUSEA AND VOMITING THAT IS NOT CONTROLLED WITH YOUR NAUSEA MEDICATION *UNUSUAL SHORTNESS OF BREATH *UNUSUAL BRUISING OR BLEEDING *URINARY PROBLEMS (pain or burning when urinating, or frequent urination) *BOWEL PROBLEMS (unusual diarrhea, constipation, pain near the anus) TENDERNESS IN MOUTH AND THROAT WITH OR WITHOUT PRESENCE OF ULCERS (sore throat, sores in mouth, or a toothache) UNUSUAL RASH, SWELLING OR PAIN  UNUSUAL VAGINAL DISCHARGE OR ITCHING   Items with * indicate a potential emergency and should be followed up as soon as possible or go to the Emergency Department if any problems should occur.  Please show the CHEMOTHERAPY ALERT CARD or  IMMUNOTHERAPY ALERT CARD at check-in to the Emergency Department and triage nurse. Should you have questions after your visit or need to cancel or reschedule your appointment, please contact [facility] - A DEPT OF [facility]  [phone] and follow the prompts.  Office hours are 8:00 a.m. to 4:30 p.m. Monday - Friday. Please note that voicemails left after 4:00 p.m. may not be returned until the following business day.  We are closed weekends and major holidays. You have access to a nurse at all times for urgent questions. Please call the main number to the clinic [phone] and follow the prompts.  For any non-urgent questions, you may also contact your provider using MyChart. We now offer e-Visits for anyone 57 and older to request care online for non-urgent symptoms. For details visit [email or web address].   Also download the MyChart app! Go to the app store, search "MyChart", open the app, select [facility], and log in with your MyChart username and password.

## 2023-10-04 ENCOUNTER — Telehealth: Payer: Self-pay | Admitting: *Deleted

## 2023-10-04 ENCOUNTER — Other Ambulatory Visit: Payer: Self-pay | Admitting: Hematology & Oncology

## 2023-10-04 DIAGNOSIS — C9000 Multiple myeloma not having achieved remission: Secondary | ICD-10-CM

## 2023-10-04 NOTE — Telephone Encounter (Signed)
Message received from patient stating that he has a "severe head cold" and would like to know what medicine he can take for it.  Dr. Myna Hidalgo notified. Pt notified to be tested for Covid and the Flu per order of Dr. Myna Hidalgo and to move appts scheduled for 10/07/23 out by one week.  Pt states that he will go to an urgent care now and is appreciative of Dr. Gustavo Lah assistance.

## 2023-10-05 ENCOUNTER — Encounter: Payer: Self-pay | Admitting: Hematology & Oncology

## 2023-10-07 ENCOUNTER — Inpatient Hospital Stay: Payer: Medicare Other

## 2023-10-14 ENCOUNTER — Encounter: Payer: Self-pay | Admitting: Family Medicine

## 2023-10-14 ENCOUNTER — Inpatient Hospital Stay: Payer: Medicare Other | Attending: Hematology & Oncology

## 2023-10-14 ENCOUNTER — Inpatient Hospital Stay: Payer: Medicare Other

## 2023-10-14 ENCOUNTER — Ambulatory Visit (INDEPENDENT_AMBULATORY_CARE_PROVIDER_SITE_OTHER): Payer: Medicare Other | Admitting: Family Medicine

## 2023-10-14 ENCOUNTER — Telehealth: Payer: Self-pay

## 2023-10-14 VITALS — BP 104/86 | HR 92 | Temp 98.1°F | Ht 71.0 in | Wt 199.8 lb

## 2023-10-14 VITALS — BP 76/60 | HR 70 | Temp 97.7°F | Resp 18

## 2023-10-14 DIAGNOSIS — R519 Headache, unspecified: Secondary | ICD-10-CM

## 2023-10-14 DIAGNOSIS — Z5112 Encounter for antineoplastic immunotherapy: Secondary | ICD-10-CM | POA: Insufficient documentation

## 2023-10-14 DIAGNOSIS — E119 Type 2 diabetes mellitus without complications: Secondary | ICD-10-CM | POA: Diagnosis not present

## 2023-10-14 DIAGNOSIS — D630 Anemia in neoplastic disease: Secondary | ICD-10-CM | POA: Insufficient documentation

## 2023-10-14 DIAGNOSIS — J189 Pneumonia, unspecified organism: Secondary | ICD-10-CM | POA: Diagnosis not present

## 2023-10-14 DIAGNOSIS — E1169 Type 2 diabetes mellitus with other specified complication: Secondary | ICD-10-CM

## 2023-10-14 DIAGNOSIS — C9000 Multiple myeloma not having achieved remission: Secondary | ICD-10-CM | POA: Insufficient documentation

## 2023-10-14 DIAGNOSIS — I2510 Atherosclerotic heart disease of native coronary artery without angina pectoris: Secondary | ICD-10-CM

## 2023-10-14 DIAGNOSIS — E785 Hyperlipidemia, unspecified: Secondary | ICD-10-CM

## 2023-10-14 LAB — CMP (CANCER CENTER ONLY)
ALT: 12 U/L (ref 0–44)
AST: 18 U/L (ref 15–41)
Albumin: 4.2 g/dL (ref 3.5–5.0)
Alkaline Phosphatase: 114 U/L (ref 38–126)
Anion gap: 7 (ref 5–15)
BUN: 24 mg/dL — ABNORMAL HIGH (ref 8–23)
CO2: 28 mmol/L (ref 22–32)
Calcium: 9.7 mg/dL (ref 8.9–10.3)
Chloride: 105 mmol/L (ref 98–111)
Creatinine: 1.21 mg/dL (ref 0.61–1.24)
GFR, Estimated: 60 mL/min — ABNORMAL LOW (ref >60)
Glucose, Bld: 148 mg/dL — ABNORMAL HIGH (ref 70–99)
Potassium: 4.4 mmol/L (ref 3.5–5.1)
Sodium: 140 mmol/L (ref 135–145)
Total Bilirubin: 0.5 mg/dL (ref 0.0–1.2)
Total Protein: 6.3 g/dL — ABNORMAL LOW (ref 6.5–8.1)

## 2023-10-14 LAB — CBC WITH DIFFERENTIAL (CANCER CENTER ONLY)
Abs Immature Granulocytes: 0.03 10*3/uL (ref 0.00–0.07)
Basophils Absolute: 0.1 10*3/uL (ref 0.0–0.1)
Basophils Relative: 1 %
Eosinophils Absolute: 0.2 10*3/uL (ref 0.0–0.5)
Eosinophils Relative: 2 %
HCT: 36.8 % — ABNORMAL LOW (ref 39.0–52.0)
Hemoglobin: 12.4 g/dL — ABNORMAL LOW (ref 13.0–17.0)
Immature Granulocytes: 0 %
Lymphocytes Relative: 24 %
Lymphs Abs: 2.2 10*3/uL (ref 0.7–4.0)
MCH: 33.2 pg (ref 26.0–34.0)
MCHC: 33.7 g/dL (ref 30.0–36.0)
MCV: 98.4 fL (ref 80.0–100.0)
Monocytes Absolute: 0.9 10*3/uL (ref 0.1–1.0)
Monocytes Relative: 10 %
Neutro Abs: 5.9 10*3/uL (ref 1.7–7.7)
Neutrophils Relative %: 63 %
Platelet Count: 174 10*3/uL (ref 150–400)
RBC: 3.74 MIL/uL — ABNORMAL LOW (ref 4.22–5.81)
RDW: 12 % (ref 11.5–15.5)
WBC Count: 9.3 10*3/uL (ref 4.0–10.5)
nRBC: 0 % (ref 0.0–0.2)

## 2023-10-14 LAB — POCT GLYCOSYLATED HEMOGLOBIN (HGB A1C): Hemoglobin A1C: 7.2 % — AB (ref 4.0–5.6)

## 2023-10-14 MED ORDER — BENZONATATE 100 MG PO CAPS: 100.0000 mg | ORAL_CAPSULE | Freq: Two times a day (BID) | ORAL | 0 refills | Status: DC | PRN

## 2023-10-14 MED ORDER — BORTEZOMIB CHEMO SQ INJECTION 3.5 MG (2.5MG/ML)
1.3000 mg/m2 | Freq: Once | INTRAMUSCULAR | Status: AC
  Administered 2023-10-14: 2.75 mg via SUBCUTANEOUS
  Filled 2023-10-14: qty 1.1

## 2023-10-14 MED ORDER — PROCHLORPERAZINE MALEATE 10 MG PO TABS: 10.0000 mg | ORAL_TABLET | Freq: Once | ORAL | Status: DC
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
## 2023-10-14 NOTE — Progress Notes (Signed)
 Ok to proceed with Velcade today. RN will adjust schedule.  Anola Gurney [place], [place], BCPS, BCOP 10/14/2023 11:18 AM

## 2023-10-14 NOTE — Patient Instructions (Signed)
 [facility] - A DEPT OF [facility]  Discharge Instructions: Thank you for choosing [facility] to provide your oncology and hematology care.   If you have a lab appointment with the Cancer Center, please go directly to the Cancer Center and check in at the registration area.  Wear comfortable clothing and clothing appropriate for easy access to any Portacath or PICC line.   We strive to give you quality time with your provider. You may need to reschedule your appointment if you arrive late (15 or more minutes).  Arriving late affects you and other patients whose appointments are after yours.  Also, if you miss three or more appointments without notifying the office, you may be dismissed from the clinic at the provider's discretion.      For prescription refill requests, have your pharmacy contact our office and allow 72 hours for refills to be completed.    Today you received the following chemotherapy and/or immunotherapy agents velcade      To help prevent nausea and vomiting after your treatment, we encourage you to take your nausea medication as directed.  BELOW ARE SYMPTOMS THAT SHOULD BE REPORTED IMMEDIATELY: *FEVER GREATER THAN 100.4 F (38 C) OR HIGHER *CHILLS OR SWEATING *NAUSEA AND VOMITING THAT IS NOT CONTROLLED WITH YOUR NAUSEA MEDICATION *UNUSUAL SHORTNESS OF BREATH *UNUSUAL BRUISING OR BLEEDING *URINARY PROBLEMS (pain or burning when urinating, or frequent urination) *BOWEL PROBLEMS (unusual diarrhea, constipation, pain near the anus) TENDERNESS IN MOUTH AND THROAT WITH OR WITHOUT PRESENCE OF ULCERS (sore throat, sores in mouth, or a toothache) UNUSUAL RASH, SWELLING OR PAIN  UNUSUAL VAGINAL DISCHARGE OR ITCHING   Items with * indicate a potential emergency and should be followed up as soon as possible or go to the Emergency Department if any problems should occur.  Please show the CHEMOTHERAPY ALERT CARD or IMMUNOTHERAPY  ALERT CARD at check-in to the Emergency Department and triage nurse. Should you have questions after your visit or need to cancel or reschedule your appointment, please contact [facility] - A DEPT OF [facility]  [phone] and follow the prompts.  Office hours are 8:00 a.m. to 4:30 p.m. Monday - Friday. Please note that voicemails left after 4:00 p.m. may not be returned until the following business day.  We are closed weekends and major holidays. You have access to a nurse at all times for urgent questions. Please call the main number to the clinic [phone] and follow the prompts.  For any non-urgent questions, you may also contact your provider using MyChart. We now offer e-Visits for anyone 58 and older to request care online for non-urgent symptoms. For details visit [email or web address].   Also download the MyChart app! Go to the app store, search "MyChart", open the app, select , and log in with your MyChart username and password.

## 2023-10-14 NOTE — Patient Instructions (Addendum)
 Point of Care (POC) a1c today  - need urine next visit  Please go to [place]  central X-ray  If symptoms are not improving into next week If symptoms do improve do it in a month to document clearance - located [address] across the street from [place] - in the basement - Hours: 8:30-5:00 PM M-F (with lunch from 12:30- 1 PM). You do NOT need an appointment.    Recommended follow up: Return in about 4 months (around 02/11/2024) for followup or sooner if needed.Schedule b4 you leave.

## 2023-10-14 NOTE — Progress Notes (Signed)
 [facility] CSW Progress Note  Clinical Child Psychotherapist contacted patient by phone to assess needs.  He expressed to the staff that he wanted a gas card.  After further discussion with patient, it was concluded that he does not qualify for the [employer].  CSW will submit an application to the [employer] for the $100 stipend.    Alexander CHRISTELLA Au, LCSW Clinical Social Worker [facility]

## 2023-10-15 ENCOUNTER — Other Ambulatory Visit: Payer: Self-pay

## 2023-10-18 ENCOUNTER — Ambulatory Visit: Payer: Medicare Other | Admitting: Family Medicine

## 2023-10-21 ENCOUNTER — Encounter: Payer: Self-pay | Admitting: Hematology & Oncology

## 2023-10-21 ENCOUNTER — Other Ambulatory Visit: Payer: Self-pay

## 2023-10-21 ENCOUNTER — Inpatient Hospital Stay: Payer: Medicare Other

## 2023-10-21 ENCOUNTER — Inpatient Hospital Stay: Payer: Medicare Other | Admitting: Hematology & Oncology

## 2023-10-21 ENCOUNTER — Telehealth: Payer: Self-pay | Admitting: *Deleted

## 2023-10-21 VITALS — BP 107/66 | HR 84 | Resp 18

## 2023-10-21 VITALS — BP 85/54 | HR 85 | Temp 97.6°F | Resp 18 | Wt 200.0 lb

## 2023-10-21 DIAGNOSIS — C9000 Multiple myeloma not having achieved remission: Secondary | ICD-10-CM

## 2023-10-21 DIAGNOSIS — Z5112 Encounter for antineoplastic immunotherapy: Secondary | ICD-10-CM | POA: Diagnosis not present

## 2023-10-21 LAB — CBC WITH DIFFERENTIAL (CANCER CENTER ONLY)
Abs Immature Granulocytes: 0.05 10*3/uL (ref 0.00–0.07)
Basophils Absolute: 0.1 10*3/uL (ref 0.0–0.1)
Basophils Relative: 1 %
Eosinophils Absolute: 0.2 10*3/uL (ref 0.0–0.5)
Eosinophils Relative: 2 %
HCT: 37.2 % — ABNORMAL LOW (ref 39.0–52.0)
Hemoglobin: 12.6 g/dL — ABNORMAL LOW (ref 13.0–17.0)
Immature Granulocytes: 1 %
Lymphocytes Relative: 21 %
Lymphs Abs: 2.3 10*3/uL (ref 0.7–4.0)
MCH: 33 pg (ref 26.0–34.0)
MCHC: 33.9 g/dL (ref 30.0–36.0)
MCV: 97.4 fL (ref 80.0–100.0)
Monocytes Absolute: 1.1 10*3/uL — ABNORMAL HIGH (ref 0.1–1.0)
Monocytes Relative: 10 %
Neutro Abs: 7 10*3/uL (ref 1.7–7.7)
Neutrophils Relative %: 65 %
Platelet Count: 143 10*3/uL — ABNORMAL LOW (ref 150–400)
RBC: 3.82 MIL/uL — ABNORMAL LOW (ref 4.22–5.81)
RDW: 12.1 % (ref 11.5–15.5)
WBC Count: 10.7 10*3/uL — ABNORMAL HIGH (ref 4.0–10.5)
nRBC: 0 % (ref 0.0–0.2)

## 2023-10-21 LAB — CMP (CANCER CENTER ONLY)
ALT: 20 U/L (ref 0–44)
AST: 22 U/L (ref 15–41)
Albumin: 4.3 g/dL (ref 3.5–5.0)
Alkaline Phosphatase: 88 U/L (ref 38–126)
Anion gap: 7 (ref 5–15)
BUN: 24 mg/dL — ABNORMAL HIGH (ref 8–23)
CO2: 28 mmol/L (ref 22–32)
Calcium: 9.4 mg/dL (ref 8.9–10.3)
Chloride: 103 mmol/L (ref 98–111)
Creatinine: 1.2 mg/dL (ref 0.61–1.24)
GFR, Estimated: >60 mL/min (ref >60)
Glucose, Bld: 181 mg/dL — ABNORMAL HIGH (ref 70–99)
Potassium: 4.1 mmol/L (ref 3.5–5.1)
Sodium: 138 mmol/L (ref 135–145)
Total Bilirubin: 0.4 mg/dL (ref 0.0–1.2)
Total Protein: 6.2 g/dL — ABNORMAL LOW (ref 6.5–8.1)

## 2023-10-21 LAB — LACTATE DEHYDROGENASE: LDH: 129 U/L (ref 98–192)

## 2023-10-21 MED ORDER — BORTEZOMIB CHEMO SQ INJECTION 3.5 MG (2.5MG/ML)
1.3000 mg/m2 | Freq: Once | INTRAMUSCULAR | Status: AC
Start: 2023-10-21 — End: 2023-10-21
  Administered 2023-10-21: 2.75 mg via SUBCUTANEOUS
  Filled 2023-10-21: qty 1.1

## 2023-10-21 MED ORDER — ACETAMINOPHEN 325 MG PO TABS
650.0000 mg | ORAL_TABLET | Freq: Once | ORAL | Status: DC
Start: 2023-10-21 — End: 2023-10-21

## 2023-10-21 MED ORDER — DARATUMUMAB-HYALURONIDASE-FIHJ 1800-30000 MG-UT/15ML ~~LOC~~ SOLN
1800.0000 mg | Freq: Once | SUBCUTANEOUS | Status: AC
  Administered 2023-10-21: 1800 mg via SUBCUTANEOUS
  Filled 2023-10-21: qty 15

## 2023-10-21 MED ORDER — DIPHENHYDRAMINE HCL 25 MG PO CAPS
50.0000 mg | ORAL_CAPSULE | Freq: Once | ORAL | Status: DC
Start: 2023-10-21 — End: 2023-10-21

## 2023-10-21 NOTE — Progress Notes (Addendum)
Ok to proceed with Cycle 14, day 1 on 10/21/23. Pt took premeds PTA.  Anola Gurney [place], [place], BCPS, BCOP 10/21/2023 11:12 AM

## 2023-10-21 NOTE — Telephone Encounter (Deleted)
Late entry  Wife called  earlier today   States  patient needs an written prescription for Eliquis . They obtain there medication from [place] Wife states they will pick up Rx on Tuesday 10/26/23  RN informed wife that would be okay . Dr Herbie Baltimore would in the office to sign prescription

## 2023-10-21 NOTE — Progress Notes (Signed)
Hematology and Oncology Follow Up Visit  Alexander Armstrong [id number] 1941/07/28 83 y.o. 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 --Start on 10/29/2022     Interim History:  Alexander Armstrong is back for follow-up.  He looks quite good.  He feels well.  He really has had no complaints.  Northey that he mention was that he did fall.  This was right before Christmas.  He was at a [employer].  He fell backwards.  Thankfully, he did not break anything.  Overall, he is doing very well with his protocol.  He did not have a monoclonal spike in his blood last time we checked.  His IgG level was 551 mg/dL.  His kappa light chain was 0.9 mg/dL.  He has had no change in bowel or bladder habits.  He has had no cough.  Thankfully, there is been no problems with COVID.  He has had no bleeding.  He has had no fever.  He has had no leg swelling.  Overall, his performance status is probably ECOG 1.    Medications:  Current Outpatient Medications:    acetaminophen (TYLENOL) 500 MG tablet, Take 500 mg by mouth 3 (three) times daily as needed for moderate pain (pain score 4-6) or headache., Disp: , Rfl:    benzonatate (TESSALON) 100 MG capsule, Take 1 capsule (100 mg total) by mouth 2 (two) times daily as needed for cough., Disp: 20 capsule, Rfl: 0   Cholecalciferol (VITAMIN D3) 1000 units CAPS, Take 1,000 Units by mouth at bedtime., Disp: , Rfl:    clopidogrel (PLAVIX) 75 MG tablet, Take 1 tablet (75 mg total) by mouth daily. TAKE 1 TABLET(75 MG) BY MOUTH DAILY, Disp: 90 tablet, Rfl: 3   Cyanocobalamin 1000 MCG TBCR, Take 1,000 mcg by mouth daily., Disp: , Rfl:    dexamethasone (DECADRON) 4 MG  tablet, TAKE 5 TABS (20 MG) WEEKLY THE DAY OF DARATUMUMAB. TAKE WITH BREAKFAST., Disp: 60 tablet, Rfl: 5   diltiazem (TIAZAC) 120 MG 24 hr capsule, Take 1 capsule (120 mg total) by mouth daily., Disp: 90 capsule, Rfl: 3   diphenhydrAMINE (BENADRYL ALLERGY) 25 MG tablet, Take 50 mg by mouth See admin instructions. Take 50 mg by mouth one hour prior to Daratumumab infusion on Mondays, Disp: , Rfl:    Docusate Sodium (DSS) 100 MG CAPS, Take by mouth daily., Disp: , Rfl:    famciclovir (FAMVIR) 250 MG tablet, TAKE 1 TABLET BY MOUTH EVERY DAY (Patient not taking: Reported on 10/14/2023), Disp: 90 tablet, Rfl: 2   fluticasone (FLONASE) 50 MCG/ACT nasal spray, Place 1 spray into both nostrils daily as needed for allergies or rhinitis., Disp: , Rfl:    lenalidomide (REVLIMID) 15 MG capsule, Take 1 capsule (15 mg total) by mouth daily. [employer] Berkley Harvey #[id number], Disp: 21 capsule, Rfl: 0   loratadine (CLARITIN) 10 MG tablet, Take 10 mg by mouth in the morning., Disp: , Rfl:    MAGNESIUM PO, Take by mouth daily., Disp: , Rfl:    montelukast (SINGULAIR) 10 MG tablet, TAKE 1 TABLET BY MOUTH EVERYDAY AT BEDTIME, Disp: 90 tablet, Rfl: 2   pantoprazole (PROTONIX) 40 MG tablet, TAKE 1 TABLET BY MOUTH EVERY DAY, Disp: 90 tablet, Rfl: 3  polyethylene glycol (MIRALAX / GLYCOLAX) 17 g packet, Take 17 g by mouth daily as needed. (Patient not taking: Reported on 10/14/2023), Disp: , Rfl:    REFRESH PLUS 0.5 % SOLN, Place 1 drop into both eyes 3 (three) times daily as needed (for dryness)., Disp: , Rfl:    rosuvastatin (CRESTOR) 20 MG tablet, TAKE 1 TABLET BY MOUTH EVERY DAY, Disp: 90 tablet, Rfl: 3   tamsulosin (FLOMAX) 0.4 MG CAPS capsule, Take 1 capsule (0.4 mg total) by mouth at bedtime., Disp: 90 capsule, Rfl: 3  Allergies:  Allergies  Allergen Reactions   Latex Other (See Comments)    "takes the skin off" (tape)    Past Medical History, Surgical history, Social history, and Family History were reviewed and  updated.  Review of Systems: Review of Systems  Constitutional: Negative.   HENT:  Negative.    Eyes: Negative.   Respiratory: Negative.    Cardiovascular: Negative.   Gastrointestinal: Negative.   Endocrine: Negative.   Genitourinary: Negative.    Musculoskeletal: Negative.   Skin: Negative.   Neurological: Negative.   Hematological: Negative.   Psychiatric/Behavioral: Negative.      Physical Exam:  Vital signs show temperature of 98.1.  Pulse 92.  Blood pressure 104/86.  Weight is 199 pounds.   Wt Readings from Last 3 Encounters:  10/14/23 199 lb 12.8 oz (90.6 kg)  09/22/23 200 lb (90.7 kg)  09/09/23 201 lb (91.2 kg)    Physical Exam Vitals reviewed.  HENT:     Head: Normocephalic and atraumatic.  Eyes:     Pupils: Pupils are equal, round, and reactive to light.  Cardiovascular:     Rate and Rhythm: Normal rate and regular rhythm.     Heart sounds: Normal heart sounds.  Pulmonary:     Effort: Pulmonary effort is normal.     Breath sounds: Normal breath sounds.  Abdominal:     General: Bowel sounds are normal.     Palpations: Abdomen is soft.     Comments: Abdominal exam is somewhat distended.  The umbilical hernia is reducible.  He has active bowel sounds.  There is no guarding or rebound tenderness.  He has no palpable liver or spleen tip.  There is no fluid wave.    Musculoskeletal:        General: No tenderness or deformity. Normal range of motion.     Cervical back: Normal range of motion.  Lymphadenopathy:     Cervical: No cervical adenopathy.  Skin:    General: Skin is warm and dry.     Findings: No erythema or rash.  Neurological:     Mental Status: He is alert and oriented to person, place, and time.  Psychiatric:        Behavior: Behavior normal.        Thought Content: Thought content normal.        Judgment: Judgment normal.    Lab Results  Component Value Date   WBC 10.7 (H) 10/21/2023   HGB 12.6 (L) 10/21/2023   HCT 37.2 (L) 10/21/2023    MCV 97.4 10/21/2023   PLT 143 (L) 10/21/2023     Chemistry      Component Value Date/Time   NA 138 10/21/2023 0924   K 4.1 10/21/2023 0924   CL 103 10/21/2023 0924   CO2 28 10/21/2023 0924   BUN 24 (H) 10/21/2023 0924   CREATININE 1.20 10/21/2023 0924   CREATININE 1.33 (H) 01/21/2017 1616  Component Value Date/Time   CALCIUM 9.4 10/21/2023 0924   ALKPHOS 88 10/21/2023 0924   AST 22 10/21/2023 0924   ALT 20 10/21/2023 0924   BILITOT 0.4 10/21/2023 0924     Impression and Plan: Mr. Ambroise is a very nice 83 year old white male.  He has IgG kappa myeloma.  His chromosomal abnormalities resolved with treatment.   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  We will continue him on therapy.  Again he is done very well with this.  We have him on a dose reduced protocol which she is done well with.  Will plan to come back to see [place] in another month.   Josph Macho, MD 1/16/202510:18 AM

## 2023-10-21 NOTE — Patient Instructions (Signed)
[facility]  Discharge Instructions: Thank you for choosing [facility] to provide your oncology and hematology care.   If you have a lab appointment with the Cancer Center, please go directly to the Cancer Center and check in at the registration area.  Wear comfortable clothing and clothing appropriate for easy access to any Portacath or PICC line.   We strive to give you quality time with your provider. You may need to reschedule your appointment if you arrive late (15 or more minutes).  Arriving late affects you and other patients whose appointments are after yours.  Also, if you miss three or more appointments without notifying the office, you may be dismissed from the clinic at the provider's discretion.      For prescription refill requests, have your pharmacy contact our office and allow 72 hours for refills to be completed.    Today you received the following chemotherapy and/or immunotherapy agents:  Faspro and Velcade      To help prevent nausea and vomiting after your treatment, we encourage you to take your nausea medication as directed.  BELOW ARE SYMPTOMS THAT SHOULD BE REPORTED IMMEDIATELY: *FEVER GREATER THAN 100.4 F (38 C) OR HIGHER *CHILLS OR SWEATING *NAUSEA AND VOMITING THAT IS NOT CONTROLLED WITH YOUR NAUSEA MEDICATION *UNUSUAL SHORTNESS OF BREATH *UNUSUAL BRUISING OR BLEEDING *URINARY PROBLEMS (pain or burning when urinating, or frequent urination) *BOWEL PROBLEMS (unusual diarrhea, constipation, pain near the anus) TENDERNESS IN MOUTH AND THROAT WITH OR WITHOUT PRESENCE OF ULCERS (sore throat, sores in mouth, or a toothache) UNUSUAL RASH, SWELLING OR PAIN  UNUSUAL VAGINAL DISCHARGE OR ITCHING   Items with * indicate a potential emergency and should be followed up as soon as possible or go to the Emergency Department if any problems should occur.  Please show the CHEMOTHERAPY ALERT CARD or  IMMUNOTHERAPY ALERT CARD at check-in to the Emergency Department and triage nurse. Should you have questions after your visit or need to cancel or reschedule your appointment, please contact [facility] - A DEPT OF [facility] Bridegroom [facility]  [phone] and follow the prompts.  Office hours are 8:00 a.m. to 4:30 p.m. Monday - Friday. Please note that voicemails left after 4:00 p.m. may not be returned until the following business day.  We are closed weekends and major holidays. You have access to a nurse at all times for urgent questions. Please call the main number to the clinic [phone] and follow the prompts.  For any non-urgent questions, you may also contact your provider using MyChart. We now offer e-Visits for anyone 24 and older to request care online for non-urgent symptoms. For details visit [email or web address].   Also download the MyChart app! Go to the app store, search "MyChart", open the app, select [place], and log in with your MyChart username and password.

## 2023-10-22 LAB — IGG, IGA, IGM
IgA: 18 mg/dL — ABNORMAL LOW (ref 61–437)
IgG (Immunoglobin G), Serum: 568 mg/dL — ABNORMAL LOW (ref 603–1613)
IgM (Immunoglobulin M), Srm: 6 mg/dL — ABNORMAL LOW (ref 15–143)

## 2023-10-22 LAB — KAPPA/LAMBDA LIGHT CHAINS
Kappa free light chain: 8.7 mg/L (ref 3.3–19.4)
Kappa, lambda light chain ratio: 2.29 — ABNORMAL HIGH (ref 0.26–1.65)
Lambda free light chains: 3.8 mg/L — ABNORMAL LOW (ref 5.7–26.3)

## 2023-10-22 NOTE — Telephone Encounter (Signed)
Open error 

## 2023-10-27 LAB — PROTEIN ELECTROPHORESIS, SERUM, WITH REFLEX
A/G Ratio: 1.7 (ref 0.7–1.7)
Albumin ELP: 3.9 g/dL (ref 2.9–4.4)
Alpha-1-Globulin: 0.2 g/dL (ref 0.0–0.4)
Alpha-2-Globulin: 0.7 g/dL (ref 0.4–1.0)
Beta Globulin: 0.8 g/dL (ref 0.7–1.3)
Gamma Globulin: 0.5 g/dL (ref 0.4–1.8)
Globulin, Total: 2.3 g/dL (ref 2.2–3.9)
Total Protein ELP: 6.2 g/dL (ref 6.0–8.5)

## 2023-11-04 ENCOUNTER — Inpatient Hospital Stay: Payer: Medicare Other

## 2023-11-04 VITALS — BP 110/58 | HR 62 | Temp 97.5°F | Resp 18

## 2023-11-04 DIAGNOSIS — C9000 Multiple myeloma not having achieved remission: Secondary | ICD-10-CM

## 2023-11-04 DIAGNOSIS — Z5112 Encounter for antineoplastic immunotherapy: Secondary | ICD-10-CM | POA: Diagnosis not present

## 2023-11-04 LAB — CMP (CANCER CENTER ONLY)
ALT: 14 U/L (ref 0–44)
AST: 17 U/L (ref 15–41)
Albumin: 4 g/dL (ref 3.5–5.0)
Alkaline Phosphatase: 70 U/L (ref 38–126)
Anion gap: 7 (ref 5–15)
BUN: 24 mg/dL — ABNORMAL HIGH (ref 8–23)
CO2: 26 mmol/L (ref 22–32)
Calcium: 9.2 mg/dL (ref 8.9–10.3)
Chloride: 106 mmol/L (ref 98–111)
Creatinine: 1.15 mg/dL (ref 0.61–1.24)
GFR, Estimated: >60 mL/min (ref >60)
Glucose, Bld: 135 mg/dL — ABNORMAL HIGH (ref 70–99)
Potassium: 4.6 mmol/L (ref 3.5–5.1)
Sodium: 139 mmol/L (ref 135–145)
Total Bilirubin: 0.4 mg/dL (ref 0.0–1.2)
Total Protein: 6 g/dL — ABNORMAL LOW (ref 6.5–8.1)

## 2023-11-04 LAB — CBC WITH DIFFERENTIAL (CANCER CENTER ONLY)
Abs Immature Granulocytes: 0.02 10*3/uL (ref 0.00–0.07)
Basophils Absolute: 0 10*3/uL (ref 0.0–0.1)
Basophils Relative: 0 %
Eosinophils Absolute: 0.2 10*3/uL (ref 0.0–0.5)
Eosinophils Relative: 2 %
HCT: 34.4 % — ABNORMAL LOW (ref 39.0–52.0)
Hemoglobin: 11.6 g/dL — ABNORMAL LOW (ref 13.0–17.0)
Immature Granulocytes: 0 %
Lymphocytes Relative: 20 %
Lymphs Abs: 1.8 10*3/uL (ref 0.7–4.0)
MCH: 33.1 pg (ref 26.0–34.0)
MCHC: 33.7 g/dL (ref 30.0–36.0)
MCV: 98.3 fL (ref 80.0–100.0)
Monocytes Absolute: 0.9 10*3/uL (ref 0.1–1.0)
Monocytes Relative: 10 %
Neutro Abs: 5.8 10*3/uL (ref 1.7–7.7)
Neutrophils Relative %: 68 %
Platelet Count: 134 10*3/uL — ABNORMAL LOW (ref 150–400)
RBC: 3.5 MIL/uL — ABNORMAL LOW (ref 4.22–5.81)
RDW: 12.3 % (ref 11.5–15.5)
WBC Count: 8.6 10*3/uL (ref 4.0–10.5)
nRBC: 0 % (ref 0.0–0.2)

## 2023-11-04 MED ORDER — PROCHLORPERAZINE MALEATE 10 MG PO TABS: 10.0000 mg | ORAL_TABLET | Freq: Once | ORAL | Status: DC

## 2023-11-04 MED ORDER — BORTEZOMIB CHEMO SQ INJECTION 3.5 MG (2.5MG/ML)
1.3000 mg/m2 | Freq: Once | INTRAMUSCULAR | Status: AC
  Administered 2023-11-04: 2.75 mg via SUBCUTANEOUS
  Filled 2023-11-04: qty 1.1

## 2023-11-04 NOTE — Patient Instructions (Signed)
[facility]  Discharge Instructions: Thank you for choosing [facility] to provide your oncology and hematology care.   If you have a lab appointment with the Cancer Center, please go directly to the Cancer Center and check in at the registration area.  Wear comfortable clothing and clothing appropriate for easy access to any Portacath or PICC line.   We strive to give you quality time with your provider. You may need to reschedule your appointment if you arrive late (15 or more minutes).  Arriving late affects you and other patients whose appointments are after yours.  Also, if you miss three or more appointments without notifying the office, you may be dismissed from the clinic at the provider's discretion.      For prescription refill requests, have your pharmacy contact our office and allow 72 hours for refills to be completed.    Today you received the following chemotherapy and/or immunotherapy agents velcade      To help prevent nausea and vomiting after your treatment, we encourage you to take your nausea medication as directed.  BELOW ARE SYMPTOMS THAT SHOULD BE REPORTED IMMEDIATELY: *FEVER GREATER THAN 100.4 F (38 C) OR HIGHER *CHILLS OR SWEATING *NAUSEA AND VOMITING THAT IS NOT CONTROLLED WITH YOUR NAUSEA MEDICATION *UNUSUAL SHORTNESS OF BREATH *UNUSUAL BRUISING OR BLEEDING *URINARY PROBLEMS (pain or burning when urinating, or frequent urination) *BOWEL PROBLEMS (unusual diarrhea, constipation, pain near the anus) TENDERNESS IN MOUTH AND THROAT WITH OR WITHOUT PRESENCE OF ULCERS (sore throat, sores in mouth, or a toothache) UNUSUAL RASH, SWELLING OR PAIN  UNUSUAL VAGINAL DISCHARGE OR ITCHING   Items with * indicate a potential emergency and should be followed up as soon as possible or go to the Emergency Department if any problems should occur.  Please show the CHEMOTHERAPY ALERT CARD or IMMUNOTHERAPY  ALERT CARD at check-in to the Emergency Department and triage nurse. Should you have questions after your visit or need to cancel or reschedule your appointment, please contact [facility] - A DEPT OF [facility] Bridegroom [facility]  [phone] and follow the prompts.  Office hours are 8:00 a.m. to 4:30 p.m. Monday - Friday. Please note that voicemails left after 4:00 p.m. may not be returned until the following business day.  We are closed weekends and major holidays. You have access to a nurse at all times for urgent questions. Please call the main number to the clinic [phone] and follow the prompts.  For any non-urgent questions, you may also contact your provider using MyChart. We now offer e-Visits for anyone 58 and older to request care online for non-urgent symptoms. For details visit [email or web address].   Also download the MyChart app! Go to the app store, search "MyChart", open the app, select , and log in with your MyChart username and password.

## 2023-11-18 ENCOUNTER — Inpatient Hospital Stay: Payer: Medicare Other | Attending: Hematology & Oncology

## 2023-11-18 ENCOUNTER — Inpatient Hospital Stay: Payer: Medicare Other | Attending: Hematology & Oncology | Admitting: Hematology & Oncology

## 2023-11-18 ENCOUNTER — Inpatient Hospital Stay: Payer: Medicare Other

## 2023-11-18 ENCOUNTER — Other Ambulatory Visit: Payer: Self-pay

## 2023-11-18 VITALS — BP 124/69 | HR 88 | Temp 97.8°F | Resp 16 | Ht 70.0 in | Wt 199.0 lb

## 2023-11-18 DIAGNOSIS — Z5112 Encounter for antineoplastic immunotherapy: Secondary | ICD-10-CM | POA: Insufficient documentation

## 2023-11-18 DIAGNOSIS — C9001 Multiple myeloma in remission: Secondary | ICD-10-CM

## 2023-11-18 DIAGNOSIS — D630 Anemia in neoplastic disease: Secondary | ICD-10-CM | POA: Insufficient documentation

## 2023-11-18 DIAGNOSIS — C9000 Multiple myeloma not having achieved remission: Secondary | ICD-10-CM | POA: Diagnosis present

## 2023-11-18 DIAGNOSIS — D469 Myelodysplastic syndrome, unspecified: Secondary | ICD-10-CM | POA: Insufficient documentation

## 2023-11-18 LAB — CBC WITH DIFFERENTIAL (CANCER CENTER ONLY)
Abs Immature Granulocytes: 0.02 10*3/uL (ref 0.00–0.07)
Basophils Absolute: 0 10*3/uL (ref 0.0–0.1)
Basophils Relative: 1 %
Eosinophils Absolute: 0.1 10*3/uL (ref 0.0–0.5)
Eosinophils Relative: 1 %
HCT: 36.4 % — ABNORMAL LOW (ref 39.0–52.0)
Hemoglobin: 12.3 g/dL — ABNORMAL LOW (ref 13.0–17.0)
Immature Granulocytes: 0 %
Lymphocytes Relative: 18 %
Lymphs Abs: 1.5 10*3/uL (ref 0.7–4.0)
MCH: 33.1 pg (ref 26.0–34.0)
MCHC: 33.8 g/dL (ref 30.0–36.0)
MCV: 97.8 fL (ref 80.0–100.0)
Monocytes Absolute: 0.8 10*3/uL (ref 0.1–1.0)
Monocytes Relative: 10 %
Neutro Abs: 5.7 10*3/uL (ref 1.7–7.7)
Neutrophils Relative %: 70 %
Platelet Count: 153 10*3/uL (ref 150–400)
RBC: 3.72 MIL/uL — ABNORMAL LOW (ref 4.22–5.81)
RDW: 12.5 % (ref 11.5–15.5)
WBC Count: 8.1 10*3/uL (ref 4.0–10.5)
nRBC: 0 % (ref 0.0–0.2)

## 2023-11-18 LAB — CMP (CANCER CENTER ONLY)
ALT: 15 U/L (ref 0–44)
AST: 16 U/L (ref 15–41)
Albumin: 4.3 g/dL (ref 3.5–5.0)
Alkaline Phosphatase: 70 U/L (ref 38–126)
Anion gap: 9 (ref 5–15)
BUN: 24 mg/dL — ABNORMAL HIGH (ref 8–23)
CO2: 27 mmol/L (ref 22–32)
Calcium: 9.5 mg/dL (ref 8.9–10.3)
Chloride: 102 mmol/L (ref 98–111)
Creatinine: 1.24 mg/dL (ref 0.61–1.24)
GFR, Estimated: 58 mL/min — ABNORMAL LOW (ref >60)
Glucose, Bld: 202 mg/dL — ABNORMAL HIGH (ref 70–99)
Potassium: 4 mmol/L (ref 3.5–5.1)
Sodium: 138 mmol/L (ref 135–145)
Total Bilirubin: 0.4 mg/dL (ref 0.0–1.2)
Total Protein: 6.2 g/dL — ABNORMAL LOW (ref 6.5–8.1)

## 2023-11-18 LAB — LACTATE DEHYDROGENASE
LDH: 138 U/L (ref 98–192)
LDH: 138 U/L (ref 98–192)

## 2023-11-18 MED ORDER — ACETAMINOPHEN 325 MG PO TABS: 650.0000 mg | ORAL_TABLET | Freq: Once | ORAL | Status: DC

## 2023-11-18 MED ORDER — MECLIZINE HCL 25 MG PO TABS: 25.0000 mg | ORAL_TABLET | Freq: Three times a day (TID) | ORAL | 5 refills | Status: AC | PRN

## 2023-11-18 MED ORDER — DARATUMUMAB-HYALURONIDASE-FIHJ 1800-30000 MG-UT/15ML ~~LOC~~ SOLN
1800.0000 mg | Freq: Once | SUBCUTANEOUS | Status: AC
  Administered 2023-11-18: 1800 mg via SUBCUTANEOUS
  Filled 2023-11-18: qty 15

## 2023-11-18 MED ORDER — BORTEZOMIB CHEMO SQ INJECTION 3.5 MG (2.5MG/ML)
1.3000 mg/m2 | Freq: Once | INTRAMUSCULAR | Status: AC
  Administered 2023-11-18: 2.75 mg via SUBCUTANEOUS
  Filled 2023-11-18: qty 1.1

## 2023-11-18 MED ORDER — DIPHENHYDRAMINE HCL 25 MG PO CAPS
50.0000 mg | ORAL_CAPSULE | Freq: Once | ORAL | Status: DC
Start: 2023-11-18 — End: 2023-11-18

## 2023-11-18 NOTE — Progress Notes (Signed)
Hematology and Oncology Follow Up Visit  Alexander Armstrong [id number] June 07, 1941 83 y.o. 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 --Start on 10/29/2022     Interim History:  Alexander Armstrong is back for follow-up.  His main problem has been that there has been some stumbling.  He has done this for quite a while.  He has gotten to his family doctor about this.  Not sure what was done or if anything can be done.  It sounds like he might need some Antivert.  I will send in some Antivert for him at 25 mg p.o. daily.  His myeloma is doing quite nicely.  There has been no monoclonal spike in his blood.  His last IgG level was 568 mg/dL.  His last Kappa light chain was 0.9 mg/dL.  He has had no problems with pain.  He has had no nausea or vomiting.  There is been no change in bowel or bladder habits.  He has had no rashes.  There has been no bleeding.  Overall, I would say his performance status is probably ECOG 1.     Medications:  Current Outpatient Medications:    acetaminophen (TYLENOL) 500 MG tablet, Take 500 mg by mouth 3 (three) times daily as needed for moderate pain (pain score 4-6) or headache., Disp: , Rfl:    Cholecalciferol (VITAMIN D3) 1000 units CAPS, Take 1,000 Units by mouth at bedtime., Disp: , Rfl:    clopidogrel (PLAVIX) 75 MG tablet, Take 1 tablet (75 mg total) by mouth daily. TAKE 1 TABLET(75 MG) BY MOUTH DAILY, Disp: 90 tablet, Rfl: 3   Cyanocobalamin 1000 MCG TBCR, Take 1,000 mcg by mouth daily., Disp: , Rfl:    dexamethasone (DECADRON) 4 MG tablet, TAKE 5 TABS (20 MG) WEEKLY THE DAY OF DARATUMUMAB. TAKE WITH BREAKFAST., Disp: 60 tablet, Rfl: 5   diltiazem (TIAZAC)  120 MG 24 hr capsule, Take 1 capsule (120 mg total) by mouth daily., Disp: 90 capsule, Rfl: 3   diphenhydrAMINE (BENADRYL ALLERGY) 25 MG tablet, Take 50 mg by mouth See admin instructions. Take 50 mg by mouth one hour prior to Daratumumab infusion on Mondays, Disp: , Rfl:    Docusate Sodium (DSS) 100 MG CAPS, Take by mouth daily., Disp: , Rfl:    famciclovir (FAMVIR) 250 MG tablet, TAKE 1 TABLET BY MOUTH EVERY DAY (Patient not taking: Reported on 10/14/2023), Disp: 90 tablet, Rfl: 2   fluticasone (FLONASE) 50 MCG/ACT nasal spray, Place 1 spray into both nostrils daily as needed for allergies or rhinitis., Disp: , Rfl:    loratadine (CLARITIN) 10 MG tablet, Take 10 mg by mouth in the morning., Disp: , Rfl:    MAGNESIUM PO, Take by mouth daily., Disp: , Rfl:    pantoprazole (PROTONIX) 40 MG tablet, TAKE 1 TABLET BY MOUTH EVERY DAY, Disp: 90 tablet, Rfl: 3   polyethylene glycol (MIRALAX / GLYCOLAX) 17 g packet, Take 17 g by mouth daily as needed. (Patient not taking: Reported on 10/14/2023), Disp: , Rfl:    REFRESH PLUS 0.5 % SOLN, Place 1 drop into both eyes 3 (three) times daily as needed (for dryness)., Disp: , Rfl:    rosuvastatin (CRESTOR) 20 MG tablet, TAKE 1 TABLET BY MOUTH EVERY  DAY, Disp: 90 tablet, Rfl: 3   tamsulosin (FLOMAX) 0.4 MG CAPS capsule, Take 1 capsule (0.4 mg total) by mouth at bedtime., Disp: 90 capsule, Rfl: 3  Allergies:  Allergies  Allergen Reactions   Latex Other (See Comments)    "takes the skin off" (tape)    Past Medical History, Surgical history, Social history, and Family History were reviewed and updated.  Review of Systems: Review of Systems  Constitutional: Negative.   HENT:  Negative.    Eyes: Negative.   Respiratory: Negative.    Cardiovascular: Negative.   Gastrointestinal: Negative.   Endocrine: Negative.   Genitourinary: Negative.    Musculoskeletal: Negative.   Skin: Negative.   Neurological: Negative.   Hematological: Negative.    Psychiatric/Behavioral: Negative.      Physical Exam:  Vital signs show temperature of 97.8.  Pulse 88.  Blood pressure 124/69.  Weight is 199 pounds.    Wt Readings from Last 3 Encounters:  11/18/23 199 lb (90.3 kg)  10/21/23 200 lb (90.7 kg)  10/14/23 199 lb 12.8 oz (90.6 kg)    Physical Exam Vitals reviewed.  HENT:     Head: Normocephalic and atraumatic.  Eyes:     Pupils: Pupils are equal, round, and reactive to light.  Cardiovascular:     Rate and Rhythm: Normal rate and regular rhythm.     Heart sounds: Normal heart sounds.  Pulmonary:     Effort: Pulmonary effort is normal.     Breath sounds: Normal breath sounds.  Abdominal:     General: Bowel sounds are normal.     Palpations: Abdomen is soft.     Comments: Abdominal exam is somewhat distended.  The umbilical hernia is reducible.  He has active bowel sounds.  There is no guarding or rebound tenderness.  He has no palpable liver or spleen tip.  There is no fluid wave.    Musculoskeletal:        General: No tenderness or deformity. Normal range of motion.     Cervical back: Normal range of motion.  Lymphadenopathy:     Cervical: No cervical adenopathy.  Skin:    General: Skin is warm and dry.     Findings: No erythema or rash.  Neurological:     Mental Status: He is alert and oriented to person, place, and time.  Psychiatric:        Behavior: Behavior normal.        Thought Content: Thought content normal.        Judgment: Judgment normal.     Lab Results  Component Value Date   WBC 8.1 11/18/2023   HGB 12.3 (L) 11/18/2023   HCT 36.4 (L) 11/18/2023   MCV 97.8 11/18/2023   PLT 153 11/18/2023     Chemistry      Component Value Date/Time   NA 138 11/18/2023 0922   K 4.0 11/18/2023 0922   CL 102 11/18/2023 0922   CO2 27 11/18/2023 0922   BUN 24 (H) 11/18/2023 0922   CREATININE 1.24 11/18/2023 0922   CREATININE 1.33 (H) 01/21/2017 1616      Component Value Date/Time   CALCIUM 9.5 11/18/2023 0922    ALKPHOS 70 11/18/2023 0922   AST 16 11/18/2023 0922   ALT 15 11/18/2023 0922   BILITOT 0.4 11/18/2023 2956     Impression and Plan: Alexander Armstrong is a very nice 83 year old white male.  He has IgG kappa myeloma.  His chromosomal abnormalities resolved with treatment.  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  We will continue him on therapy.  Again he is done very well with this.  We have him on a dose reduced protocol which he ha done well with.  His last bone marrow biopsy was back in January.  At some point, I probably will have another 1 done.    Josph Macho, MD 2/13/202510:07 AM

## 2023-11-18 NOTE — Patient Instructions (Signed)
[facility]  Discharge Instructions: Thank you for choosing [facility] to provide your oncology and hematology care.   If you have a lab appointment with the Cancer Center, please go directly to the Cancer Center and check in at the registration area.  Wear comfortable clothing and clothing appropriate for easy access to any Portacath or PICC line.   We strive to give you quality time with your provider. You may need to reschedule your appointment if you arrive late (15 or more minutes).  Arriving late affects you and other patients whose appointments are after yours.  Also, if you miss three or more appointments without notifying the office, you may be dismissed from the clinic at the provider's discretion.      For prescription refill requests, have your pharmacy contact our office and allow 72 hours for refills to be completed.    Today you received the following chemotherapy and/or immunotherapy agents Velcade/Darzalex      To help prevent nausea and vomiting after your treatment, we encourage you to take your nausea medication as directed.  BELOW ARE SYMPTOMS THAT SHOULD BE REPORTED IMMEDIATELY: *FEVER GREATER THAN 100.4 F (38 C) OR HIGHER *CHILLS OR SWEATING *NAUSEA AND VOMITING THAT IS NOT CONTROLLED WITH YOUR NAUSEA MEDICATION *UNUSUAL SHORTNESS OF BREATH *UNUSUAL BRUISING OR BLEEDING *URINARY PROBLEMS (pain or burning when urinating, or frequent urination) *BOWEL PROBLEMS (unusual diarrhea, constipation, pain near the anus) TENDERNESS IN MOUTH AND THROAT WITH OR WITHOUT PRESENCE OF ULCERS (sore throat, sores in mouth, or a toothache) UNUSUAL RASH, SWELLING OR PAIN  UNUSUAL VAGINAL DISCHARGE OR ITCHING   Items with * indicate a potential emergency and should be followed up as soon as possible or go to the Emergency Department if any problems should occur.  Please show the CHEMOTHERAPY ALERT CARD or  IMMUNOTHERAPY ALERT CARD at check-in to the Emergency Department and triage nurse. Should you have questions after your visit or need to cancel or reschedule your appointment, please contact [facility] - A DEPT OF [facility]igh[facility] Bridegroom [facility]  [phone] and follow the prompts.  Office hours are 8:00 a.m. to 4:30 p.m. Monday - Friday. Please note that voicemails left after 4:00 p.m. may not be returned until the following business day.  We are closed weekends and major holidays. You have access to a nurse at all times for urgent questions. Please call the main number to the clinic [phone] and follow the prompts.  For any non-urgent questions, you may also contact your provider using MyChart. We now offer e-Visits for anyone 71 and older to request care online for non-urgent symptoms. For details visit [email or web address].   Also download the MyChart app! Go to the app store, search "MyChart", open the app, select , and log in with your MyChart username and password.

## 2023-11-19 LAB — KAPPA/LAMBDA LIGHT CHAINS
Kappa free light chain: 10.1 mg/L (ref 3.3–19.4)
Kappa, lambda light chain ratio: 2.46 — ABNORMAL HIGH (ref 0.26–1.65)
Lambda free light chains: 4.1 mg/L — ABNORMAL LOW (ref 5.7–26.3)

## 2023-11-20 LAB — IGG, IGA, IGM
IgA: 16 mg/dL — ABNORMAL LOW (ref 61–437)
IgG (Immunoglobin G), Serum: 595 mg/dL — ABNORMAL LOW (ref 603–1613)
IgM (Immunoglobulin M), Srm: 6 mg/dL — ABNORMAL LOW (ref 15–143)

## 2023-11-22 LAB — PROTEIN ELECTROPHORESIS, SERUM, WITH REFLEX
A/G Ratio: 2 — ABNORMAL HIGH (ref 0.7–1.7)
Albumin ELP: 3.9 g/dL (ref 2.9–4.4)
Alpha-1-Globulin: 0.2 g/dL (ref 0.0–0.4)
Alpha-2-Globulin: 0.6 g/dL (ref 0.4–1.0)
Beta Globulin: 0.8 g/dL (ref 0.7–1.3)
Gamma Globulin: 0.4 g/dL (ref 0.4–1.8)
Globulin, Total: 2 g/dL — ABNORMAL LOW (ref 2.2–3.9)
Total Protein ELP: 5.9 g/dL — ABNORMAL LOW (ref 6.0–8.5)

## 2023-12-02 ENCOUNTER — Ambulatory Visit: Payer: Medicare Other

## 2023-12-02 ENCOUNTER — Inpatient Hospital Stay: Payer: Medicare Other

## 2023-12-02 ENCOUNTER — Inpatient Hospital Stay (HOSPITAL_BASED_OUTPATIENT_CLINIC_OR_DEPARTMENT_OTHER): Payer: Medicare Other | Admitting: Medical Oncology

## 2023-12-02 ENCOUNTER — Other Ambulatory Visit: Payer: Medicare Other

## 2023-12-02 ENCOUNTER — Encounter: Payer: Self-pay | Admitting: Medical Oncology

## 2023-12-02 ENCOUNTER — Encounter: Payer: Self-pay | Admitting: Hematology & Oncology

## 2023-12-02 VITALS — BP 105/86 | HR 89 | Temp 97.8°F | Resp 18 | Ht 70.0 in | Wt 198.8 lb

## 2023-12-02 DIAGNOSIS — C9001 Multiple myeloma in remission: Secondary | ICD-10-CM

## 2023-12-02 DIAGNOSIS — Z5112 Encounter for antineoplastic immunotherapy: Secondary | ICD-10-CM | POA: Diagnosis not present

## 2023-12-02 DIAGNOSIS — C9000 Multiple myeloma not having achieved remission: Secondary | ICD-10-CM | POA: Diagnosis not present

## 2023-12-02 LAB — CBC WITH DIFFERENTIAL (CANCER CENTER ONLY)
Abs Immature Granulocytes: 0.03 10*3/uL (ref 0.00–0.07)
Basophils Absolute: 0 10*3/uL (ref 0.0–0.1)
Basophils Relative: 1 %
Eosinophils Absolute: 0.1 10*3/uL (ref 0.0–0.5)
Eosinophils Relative: 2 %
HCT: 37.9 % — ABNORMAL LOW (ref 39.0–52.0)
Hemoglobin: 13 g/dL (ref 13.0–17.0)
Immature Granulocytes: 0 %
Lymphocytes Relative: 22 %
Lymphs Abs: 2 10*3/uL (ref 0.7–4.0)
MCH: 33.3 pg (ref 26.0–34.0)
MCHC: 34.3 g/dL (ref 30.0–36.0)
MCV: 97.2 fL (ref 80.0–100.0)
Monocytes Absolute: 0.9 10*3/uL (ref 0.1–1.0)
Monocytes Relative: 10 %
Neutro Abs: 5.8 10*3/uL (ref 1.7–7.7)
Neutrophils Relative %: 65 %
Platelet Count: 152 10*3/uL (ref 150–400)
RBC: 3.9 MIL/uL — ABNORMAL LOW (ref 4.22–5.81)
RDW: 12.4 % (ref 11.5–15.5)
WBC Count: 8.8 10*3/uL (ref 4.0–10.5)
nRBC: 0 % (ref 0.0–0.2)

## 2023-12-02 LAB — CMP (CANCER CENTER ONLY)
ALT: 12 U/L (ref 0–44)
AST: 16 U/L (ref 15–41)
Albumin: 4.4 g/dL (ref 3.5–5.0)
Alkaline Phosphatase: 65 U/L (ref 38–126)
Anion gap: 9 (ref 5–15)
BUN: 20 mg/dL (ref 8–23)
CO2: 26 mmol/L (ref 22–32)
Calcium: 9.7 mg/dL (ref 8.9–10.3)
Chloride: 105 mmol/L (ref 98–111)
Creatinine: 1.28 mg/dL — ABNORMAL HIGH (ref 0.61–1.24)
GFR, Estimated: 56 mL/min — ABNORMAL LOW (ref >60)
Glucose, Bld: 155 mg/dL — ABNORMAL HIGH (ref 70–99)
Potassium: 4.1 mmol/L (ref 3.5–5.1)
Sodium: 140 mmol/L (ref 135–145)
Total Bilirubin: 0.5 mg/dL (ref 0.0–1.2)
Total Protein: 6 g/dL — ABNORMAL LOW (ref 6.5–8.1)

## 2023-12-02 LAB — LACTATE DEHYDROGENASE: LDH: 134 U/L (ref 98–192)

## 2023-12-02 MED ORDER — BORTEZOMIB CHEMO SQ INJECTION 3.5 MG (2.5MG/ML)
1.3000 mg/m2 | Freq: Once | INTRAMUSCULAR | Status: AC
  Administered 2023-12-02: 2.75 mg via SUBCUTANEOUS
  Filled 2023-12-02: qty 1.1

## 2023-12-02 MED ORDER — PROCHLORPERAZINE MALEATE 10 MG PO TABS
10.0000 mg | ORAL_TABLET | Freq: Once | ORAL | Status: DC
  Filled 2023-12-02: qty 1

## 2023-12-02 NOTE — Patient Instructions (Signed)
 Bortezomib Injection What is this medication? BORTEZOMIB (bor TEZ oh mib) treats lymphoma. It may also be used to treat multiple myeloma, a type of bone marrow cancer. It works by blocking a protein that causes cancer cells to grow and multiply. This helps to slow or stop the spread of cancer cells. This medicine may be used for other purposes; ask your health care provider or pharmacist if you have questions. COMMON BRAND NAME(S): BORUZU, Velcade What should I tell my care team before I take this medication? They need to know if you have any of these conditions: Dehydration Diabetes Heart disease Liver disease Tingling of the fingers or toes or other nerve disorder An unusual or allergic reaction to bortezomib, other medications, foods, dyes, or preservatives If you or your partner are pregnant or trying to get pregnant Breastfeeding How should I use this medication? This medication is injected into a vein or under the skin. It is given by your care team in a hospital or clinic setting. Talk to your care team about the use of this medication in children. Special care may be needed. Overdosage: If you think you have taken too much of this medicine contact a poison control center or emergency room at once. NOTE: This medicine is only for you. Do not share this medicine with others. What if I miss a dose? Keep appointments for follow-up doses. It is important not to miss your dose. Call your care team if you are unable to keep an appointment. What may interact with this medication? Ketoconazole Rifampin This list may not describe all possible interactions. Give your health care provider a list of all the medicines, herbs, non-prescription drugs, or dietary supplements you use. Also tell them if you smoke, drink alcohol, or use illegal drugs. Some items may interact with your medicine. What should I watch for while using this medication? Your condition will be monitored carefully while you  are receiving this medication. You may need blood work while taking this medication. This medication may affect your coordination, reaction time, or judgment. Do not drive or operate machinery until you know how this medication affects you. Sit up or stand slowly to reduce the risk of dizzy or fainting spells. Drinking alcohol with this medication can increase the risk of these side effects. This medication may increase your risk of getting an infection. Call your care team for advice if you get a fever, chills, sore throat, or other symptoms of a cold or flu. Do not treat yourself. Try to avoid being around people who are sick. Check with your care team if you have severe diarrhea, nausea, and vomiting, or if you sweat a lot. The loss of too much body fluid may make it dangerous for you to take this medication. Talk to your care team if you may be pregnant. Serious birth defects can occur if you take this medication during pregnancy and for 7 months after the last dose. You will need a negative pregnancy test before starting this medication. Contraception is recommended while taking this medication and for 7 months after the last dose. Your care team can help you find the option that works for you. If your partner can get pregnant, use a condom during sex while taking this medication and for 4 months after the last dose. Do not breastfeed while taking this medication and for 2 months after the last dose. This medication may cause infertility. Talk to your care team if you are concerned about your fertility. What side  effects may I notice from receiving this medication? Side effects that you should report to your care team as soon as possible: Allergic reactions--skin rash, itching, hives, swelling of the face, lips, tongue, or throat Bleeding--bloody or black, tar-like stools, vomiting blood or brown material that looks like coffee grounds, red or dark brown urine, small red or purple spots on skin,  unusual bruising or bleeding Bleeding in the brain--severe headache, stiff neck, confusion, dizziness, change in vision, numbness or weakness of the face, arm, or leg, trouble speaking, trouble walking, vomiting Bowel blockage--stomach cramping, unable to have a bowel movement or pass gas, loss of appetite, vomiting Heart failure--shortness of breath, swelling of the ankles, feet, or hands, sudden weight gain, unusual weakness or fatigue Infection--fever, chills, cough, sore throat, wounds that don't heal, pain or trouble when passing urine, general feeling of discomfort or being unwell Liver injury--right upper belly pain, loss of appetite, nausea, light-colored stool, dark yellow or brown urine, yellowing skin or eyes, unusual weakness or fatigue Low blood pressure--dizziness, feeling faint or lightheaded, blurry vision Lung injury--shortness of breath or trouble breathing, cough, spitting up blood, chest pain, fever Pain, tingling, or numbness in the hands or feet Severe or prolonged diarrhea Stomach pain, bloody diarrhea, pale skin, unusual weakness or fatigue, decrease in the amount of urine, which may be signs of hemolytic uremic syndrome Sudden and severe headache, confusion, change in vision, seizures, which may be signs of posterior reversible encephalopathy syndrome (PRES) TTP--purple spots on the skin or inside the mouth, pale skin, yellowing skin or eyes, unusual weakness or fatigue, fever, fast or irregular heartbeat, confusion, change in vision, trouble speaking, trouble walking Tumor lysis syndrome (TLS)--nausea, vomiting, diarrhea, decrease in the amount of urine, dark urine, unusual weakness or fatigue, confusion, muscle pain or cramps, fast or irregular heartbeat, joint pain Side effects that usually do not require medical attention (report to your care team if they continue or are bothersome): Constipation Diarrhea Fatigue Loss of appetite Nausea This list may not describe all  possible side effects. Call your doctor for medical advice about side effects. You may report side effects to FDA at 1-800-FDA-1088. Where should I keep my medication? This medication is given in a hospital or clinic. It will not be stored at home. NOTE: This sheet is a summary. It may not cover all possible information. If you have questions about this medicine, talk to your doctor, pharmacist, or health care provider.  2024 Elsevier/Gold Standard (2022-02-24 00:00:00)

## 2023-12-02 NOTE — Progress Notes (Signed)
 Hematology and Oncology Follow Up Visit  Terris Germano [id number] [birth year] 83 y.o. 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 --Start on 10/29/2022     Interim History:  Mr. Ohalloran is back for follow-up.   At his last visit Dr. Myna Hidalgo sent him in Antivert 25 mg p.o. daily to help with vertigo. Today he reports that the medication has helped a bit. Walking is steadier. No side effects from this medication which he is taking once per day.   His myeloma is stable.  There has been no monoclonal spike in his blood.  His last IgG level was 568 mg/dL.  His last Kappa light chain was 0.9 mg/dL.  He has had no problems with pain.  He has had no nausea or vomiting.  There is been no change in bowel or bladder habits.  He has had no rashes.  There has been no bleeding.  Overall, I would say his performance status is probably ECOG 1.   Wt Readings from Last 3 Encounters:  12/02/23 198 lb 12.8 oz (90.2 kg)  11/18/23 199 lb (90.3 kg)  10/21/23 200 lb (90.7 kg)     Medications:  Current Outpatient Medications:    acetaminophen (TYLENOL) 500 MG tablet, Take 500 mg by mouth 3 (three) times daily as needed for moderate pain (pain score 4-6) or headache., Disp: , Rfl:    Cholecalciferol (VITAMIN D3) 1000 units CAPS, Take 1,000 Units by mouth at bedtime., Disp: , Rfl:    clopidogrel (PLAVIX) 75 MG tablet, Take 1 tablet (75 mg total) by mouth daily. TAKE 1 TABLET(75 MG) BY MOUTH DAILY, Disp: 90 tablet, Rfl: 3   Cyanocobalamin 1000 MCG TBCR, Take 1,000 mcg by mouth daily., Disp: , Rfl:    dexamethasone (DECADRON) 4 MG tablet, TAKE 5 TABS (20 MG) WEEKLY THE DAY OF DARATUMUMAB. TAKE WITH  BREAKFAST., Disp: 60 tablet, Rfl: 5   diltiazem (TIAZAC) 120 MG 24 hr capsule, Take 1 capsule (120 mg total) by mouth daily., Disp: 90 capsule, Rfl: 3   diphenhydrAMINE (BENADRYL ALLERGY) 25 MG tablet, Take 50 mg by mouth See admin instructions. Take 50 mg by mouth one hour prior to Daratumumab infusion on Mondays, Disp: , Rfl:    Docusate Sodium (DSS) 100 MG CAPS, Take by mouth daily., Disp: , Rfl:    famciclovir (FAMVIR) 250 MG tablet, TAKE 1 TABLET BY MOUTH EVERY DAY, Disp: 90 tablet, Rfl: 2   fluticasone (FLONASE) 50 MCG/ACT nasal spray, Place 1 spray into both nostrils daily as needed for allergies or rhinitis., Disp: , Rfl:    loratadine (CLARITIN) 10 MG tablet, Take 10 mg by mouth in the morning., Disp: , Rfl:    MAGNESIUM PO, Take by mouth daily., Disp: , Rfl:    meclizine (ANTIVERT) 25 MG tablet, Take 1 tablet (25 mg total) by mouth 3 (three) times daily as needed for dizziness., Disp: 30 tablet, Rfl: 5   montelukast (SINGULAIR) 10 MG tablet, Take 10 mg by mouth daily., Disp: , Rfl:    pantoprazole (PROTONIX) 40 MG tablet, TAKE 1 TABLET BY MOUTH EVERY DAY, Disp: 90 tablet, Rfl: 3   polyethylene glycol (MIRALAX / GLYCOLAX) 17 g packet, Take 17 g by mouth daily as needed., Disp: ,  Rfl:    REFRESH PLUS 0.5 % SOLN, Place 1 drop into both eyes 3 (three) times daily as needed (for dryness)., Disp: , Rfl:    rosuvastatin (CRESTOR) 20 MG tablet, TAKE 1 TABLET BY MOUTH EVERY DAY, Disp: 90 tablet, Rfl: 3   tamsulosin (FLOMAX) 0.4 MG CAPS capsule, Take 1 capsule (0.4 mg total) by mouth at bedtime., Disp: 90 capsule, Rfl: 3  Allergies:  Allergies  Allergen Reactions   Latex Other (See Comments)    "takes the skin off" (tape)    Past Medical History, Surgical history, Social history, and Family History were reviewed and updated.  Review of Systems: Review of Systems  Constitutional: Negative.   HENT:  Negative.    Eyes: Negative.   Respiratory: Negative.    Cardiovascular: Negative.    Gastrointestinal: Negative.   Endocrine: Negative.   Genitourinary: Negative.    Musculoskeletal: Negative.   Skin: Negative.   Neurological: Negative.   Hematological: Negative.   Psychiatric/Behavioral: Negative.      Physical Exam:  Vitals:   12/02/23 1018  BP: 105/86  Pulse: 89  Resp: 18  Temp: 97.8 F (36.6 C)  SpO2: 100%     Wt Readings from Last 3 Encounters:  12/02/23 198 lb 12.8 oz (90.2 kg)  11/18/23 199 lb (90.3 kg)  10/21/23 200 lb (90.7 kg)    Physical Exam Vitals reviewed.  HENT:     Head: Normocephalic and atraumatic.  Eyes:     Pupils: Pupils are equal, round, and reactive to light.  Cardiovascular:     Rate and Rhythm: Normal rate and regular rhythm.     Heart sounds: Normal heart sounds.  Pulmonary:     Effort: Pulmonary effort is normal.     Breath sounds: Normal breath sounds.  Abdominal:     General: Bowel sounds are normal.     Palpations: Abdomen is soft.     Comments: Abdominal exam is somewhat distended.  The umbilical hernia is reducible.  He has active bowel sounds.  There is no guarding or rebound tenderness.  He has no palpable liver or spleen tip.  There is no fluid wave.    Musculoskeletal:        General: No tenderness or deformity. Normal range of motion.     Cervical back: Normal range of motion.  Lymphadenopathy:     Cervical: No cervical adenopathy.  Skin:    General: Skin is warm and dry.     Findings: No erythema or rash.  Neurological:     Mental Status: He is alert and oriented to person, place, and time.  Psychiatric:        Behavior: Behavior normal.        Thought Content: Thought content normal.        Judgment: Judgment normal.     Lab Results  Component Value Date   WBC 8.8 12/02/2023   HGB 13.0 12/02/2023   HCT 37.9 (L) 12/02/2023   MCV 97.2 12/02/2023   PLT 152 12/02/2023     Chemistry      Component Value Date/Time   NA 140 12/02/2023 1004   K 4.1 12/02/2023 1004   CL 105 12/02/2023 1004    CO2 26 12/02/2023 1004   BUN 20 12/02/2023 1004   CREATININE 1.28 (H) 12/02/2023 1004   CREATININE 1.33 (H) 01/21/2017 1616      Component Value Date/Time   CALCIUM 9.7 12/02/2023 1004   ALKPHOS 65 12/02/2023 1004   AST 16  12/02/2023 1004   ALT 12 12/02/2023 1004   BILITOT 0.5 12/02/2023 1004     Encounter Diagnosis  Name Primary?   Multiple myeloma not having achieved remission (HCC) Yes    Impression and Plan: Mr. Diiorio is a very nice 83 year old white male.  He has IgG kappa myeloma.  His chromosomal abnormalities resolved with treatment. Currently he is being treated with Darzalex Faspro/ Velcade.   He continues to do well. Myeloma appears fairly stable- monoclonal labs pending today Today CBC and CMP are stable and acceptable for treatment today  Cycle 15 day 15 today RTC 12/16/2023 MD, labs (CBC w/, CMP, light chains, MM panel, LDH), Cycle 16 day 1   Brand Males [place], [place] 2/27/202510:54 AM

## 2023-12-03 LAB — IGG, IGA, IGM
IgA: 16 mg/dL — ABNORMAL LOW (ref 61–437)
IgG (Immunoglobin G), Serum: 595 mg/dL — ABNORMAL LOW (ref 603–1613)
IgM (Immunoglobulin M), Srm: 6 mg/dL — ABNORMAL LOW (ref 15–143)

## 2023-12-03 LAB — KAPPA/LAMBDA LIGHT CHAINS
Kappa free light chain: 9.6 mg/L (ref 3.3–19.4)
Kappa, lambda light chain ratio: 2.29 — ABNORMAL HIGH (ref 0.26–1.65)
Lambda free light chains: 4.2 mg/L — ABNORMAL LOW (ref 5.7–26.3)

## 2023-12-09 LAB — PROTEIN ELECTROPHORESIS, SERUM, WITH REFLEX
A/G Ratio: 1.4 (ref 0.7–1.7)
Albumin ELP: 3.6 g/dL (ref 2.9–4.4)
Alpha-1-Globulin: 0.2 g/dL (ref 0.0–0.4)
Alpha-2-Globulin: 0.8 g/dL (ref 0.4–1.0)
Beta Globulin: 0.9 g/dL (ref 0.7–1.3)
Gamma Globulin: 0.5 g/dL (ref 0.4–1.8)
Globulin, Total: 2.5 g/dL (ref 2.2–3.9)
M-Spike, %: 0.2 g/dL — ABNORMAL HIGH
SPEP Interpretation: 0
Total Protein ELP: 6.1 g/dL (ref 6.0–8.5)

## 2023-12-09 LAB — IMMUNOFIXATION REFLEX, SERUM
IgA: 15 mg/dL — ABNORMAL LOW (ref 61–437)
IgG (Immunoglobin G), Serum: 578 mg/dL — ABNORMAL LOW (ref 603–1613)
IgM (Immunoglobulin M), Srm: 7 mg/dL — ABNORMAL LOW (ref 15–143)

## 2023-12-16 ENCOUNTER — Other Ambulatory Visit (INDEPENDENT_AMBULATORY_CARE_PROVIDER_SITE_OTHER)

## 2023-12-16 ENCOUNTER — Inpatient Hospital Stay (HOSPITAL_BASED_OUTPATIENT_CLINIC_OR_DEPARTMENT_OTHER): Payer: Medicare Other | Admitting: Medical Oncology

## 2023-12-16 ENCOUNTER — Telehealth: Payer: Self-pay | Admitting: Family Medicine

## 2023-12-16 ENCOUNTER — Encounter: Payer: Self-pay | Admitting: Medical Oncology

## 2023-12-16 ENCOUNTER — Inpatient Hospital Stay: Payer: Medicare Other

## 2023-12-16 ENCOUNTER — Inpatient Hospital Stay: Payer: Medicare Other | Attending: Hematology & Oncology

## 2023-12-16 VITALS — BP 106/59 | HR 64 | Temp 97.7°F | Resp 18 | Ht 70.0 in | Wt 203.0 lb

## 2023-12-16 DIAGNOSIS — E119 Type 2 diabetes mellitus without complications: Secondary | ICD-10-CM | POA: Diagnosis not present

## 2023-12-16 DIAGNOSIS — D6481 Anemia due to antineoplastic chemotherapy: Secondary | ICD-10-CM | POA: Diagnosis not present

## 2023-12-16 DIAGNOSIS — D630 Anemia in neoplastic disease: Secondary | ICD-10-CM | POA: Diagnosis not present

## 2023-12-16 DIAGNOSIS — D6959 Other secondary thrombocytopenia: Secondary | ICD-10-CM | POA: Insufficient documentation

## 2023-12-16 DIAGNOSIS — Z5112 Encounter for antineoplastic immunotherapy: Secondary | ICD-10-CM | POA: Diagnosis present

## 2023-12-16 DIAGNOSIS — C9000 Multiple myeloma not having achieved remission: Secondary | ICD-10-CM

## 2023-12-16 DIAGNOSIS — T451X5A Adverse effect of antineoplastic and immunosuppressive drugs, initial encounter: Secondary | ICD-10-CM | POA: Diagnosis not present

## 2023-12-16 LAB — CBC WITH DIFFERENTIAL (CANCER CENTER ONLY)
Abs Immature Granulocytes: 0.02 10*3/uL (ref 0.00–0.07)
Basophils Absolute: 0 10*3/uL (ref 0.0–0.1)
Basophils Relative: 0 %
Eosinophils Absolute: 0.2 10*3/uL (ref 0.0–0.5)
Eosinophils Relative: 2 %
HCT: 36.7 % — ABNORMAL LOW (ref 39.0–52.0)
Hemoglobin: 11.9 g/dL — ABNORMAL LOW (ref 13.0–17.0)
Immature Granulocytes: 0 %
Lymphocytes Relative: 20 %
Lymphs Abs: 1.7 10*3/uL (ref 0.7–4.0)
MCH: 32.3 pg (ref 26.0–34.0)
MCHC: 32.4 g/dL (ref 30.0–36.0)
MCV: 99.7 fL (ref 80.0–100.0)
Monocytes Absolute: 0.8 10*3/uL (ref 0.1–1.0)
Monocytes Relative: 10 %
Neutro Abs: 5.7 10*3/uL (ref 1.7–7.7)
Neutrophils Relative %: 68 %
Platelet Count: 139 10*3/uL — ABNORMAL LOW (ref 150–400)
RBC: 3.68 MIL/uL — ABNORMAL LOW (ref 4.22–5.81)
RDW: 12.7 % (ref 11.5–15.5)
WBC Count: 8.4 10*3/uL (ref 4.0–10.5)
nRBC: 0 % (ref 0.0–0.2)

## 2023-12-16 LAB — CMP (CANCER CENTER ONLY)
ALT: 14 U/L (ref 0–44)
AST: 17 U/L (ref 15–41)
Albumin: 4.3 g/dL (ref 3.5–5.0)
Alkaline Phosphatase: 70 U/L (ref 38–126)
Anion gap: 8 (ref 5–15)
BUN: 21 mg/dL (ref 8–23)
CO2: 28 mmol/L (ref 22–32)
Calcium: 9.1 mg/dL (ref 8.9–10.3)
Chloride: 106 mmol/L (ref 98–111)
Creatinine: 1.13 mg/dL (ref 0.61–1.24)
GFR, Estimated: >60 mL/min (ref >60)
Glucose, Bld: 123 mg/dL — ABNORMAL HIGH (ref 70–99)
Potassium: 4.4 mmol/L (ref 3.5–5.1)
Sodium: 142 mmol/L (ref 135–145)
Total Bilirubin: 0.5 mg/dL (ref 0.0–1.2)
Total Protein: 6.1 g/dL — ABNORMAL LOW (ref 6.5–8.1)

## 2023-12-16 LAB — LACTATE DEHYDROGENASE: LDH: 137 U/L (ref 98–192)

## 2023-12-16 MED ORDER — ACETAMINOPHEN 325 MG PO TABS: 650.0000 mg | ORAL_TABLET | Freq: Once | ORAL | Status: DC

## 2023-12-16 MED ORDER — BORTEZOMIB CHEMO SQ INJECTION 3.5 MG (2.5MG/ML)
1.3000 mg/m2 | Freq: Once | INTRAMUSCULAR | Status: AC
  Administered 2023-12-16: 2.75 mg via SUBCUTANEOUS
  Filled 2023-12-16: qty 1.1

## 2023-12-16 MED ORDER — DARATUMUMAB-HYALURONIDASE-FIHJ 1800-30000 MG-UT/15ML ~~LOC~~ SOLN
1800.0000 mg | Freq: Once | SUBCUTANEOUS | Status: AC
  Administered 2023-12-16: 1800 mg via SUBCUTANEOUS
  Filled 2023-12-16: qty 15

## 2023-12-16 MED ORDER — DIPHENHYDRAMINE HCL 25 MG PO CAPS: 50.0000 mg | ORAL_CAPSULE | Freq: Once | ORAL | Status: DC

## 2023-12-16 NOTE — Progress Notes (Signed)
 Hematology and Oncology Follow Up Visit  Alexander Armstrong [id number] May 04, 1941 83 y.o. 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 --Start on 10/29/2022     Interim History:  Alexander Armstrong is back for follow-up. He is here with his wife.   Today he states that he is doing well. He has no concerns today.   His myeloma appears to be stable but at his last visit he did have an M-spike of 0.2.  His last IgG level was 595 mg/dL.  His last Kappa light chain was 9.6 mg/L.  He has had no problems with pain.  He has had no nausea or vomiting.  There is been no change in bowel or bladder habits.  He has had no rashes.  There has been no bleeding.  Overall, I would say his performance status is probably ECOG 1.   Wt Readings from Last 3 Encounters:  12/16/23 203 lb (92.1 kg)  12/02/23 198 lb 12.8 oz (90.2 kg)  11/18/23 199 lb (90.3 kg)     Medications:  Current Outpatient Medications:    acetaminophen (TYLENOL) 500 MG tablet, Take 500 mg by mouth 3 (three) times daily as needed for moderate pain (pain score 4-6) or headache., Disp: , Rfl:    Cholecalciferol (VITAMIN D3) 1000 units CAPS, Take 1,000 Units by mouth at bedtime., Disp: , Rfl:    clopidogrel (PLAVIX) 75 MG tablet, Take 1 tablet (75 mg total) by mouth daily. TAKE 1 TABLET(75 MG) BY MOUTH DAILY, Disp: 90 tablet, Rfl: 3   Cyanocobalamin 1000 MCG TBCR, Take 1,000 mcg by mouth daily., Disp: , Rfl:    dexamethasone (DECADRON) 4 MG tablet, TAKE 5 TABS (20 MG) WEEKLY THE DAY OF DARATUMUMAB. TAKE WITH BREAKFAST., Disp: 60 tablet, Rfl: 5   diltiazem (TIAZAC) 120 MG 24 hr capsule, Take 1 capsule (120 mg total) by mouth daily., Disp:  90 capsule, Rfl: 3   diphenhydrAMINE (BENADRYL ALLERGY) 25 MG tablet, Take 50 mg by mouth See admin instructions. Take 50 mg by mouth one hour prior to Daratumumab infusion on Mondays, Disp: , Rfl:    Docusate Sodium (DSS) 100 MG CAPS, Take by mouth daily., Disp: , Rfl:    famciclovir (FAMVIR) 250 MG tablet, TAKE 1 TABLET BY MOUTH EVERY DAY, Disp: 90 tablet, Rfl: 2   fluticasone (FLONASE) 50 MCG/ACT nasal spray, Place 1 spray into both nostrils daily as needed for allergies or rhinitis., Disp: , Rfl:    loratadine (CLARITIN) 10 MG tablet, Take 10 mg by mouth in the morning., Disp: , Rfl:    MAGNESIUM PO, Take by mouth daily., Disp: , Rfl:    meclizine (ANTIVERT) 25 MG tablet, Take 1 tablet (25 mg total) by mouth 3 (three) times daily as needed for dizziness., Disp: 30 tablet, Rfl: 5   montelukast (SINGULAIR) 10 MG tablet, Take 10 mg by mouth daily., Disp: , Rfl:    pantoprazole (PROTONIX) 40 MG tablet, TAKE 1 TABLET BY MOUTH EVERY DAY, Disp: 90 tablet, Rfl: 3   polyethylene glycol (MIRALAX / GLYCOLAX) 17 g packet, Take 17 g by mouth daily as needed., Disp: , Rfl:    REFRESH PLUS 0.5 % SOLN, Place 1 drop into both eyes 3 (three) times daily as needed (  for dryness)., Disp: , Rfl:    rosuvastatin (CRESTOR) 20 MG tablet, TAKE 1 TABLET BY MOUTH EVERY DAY, Disp: 90 tablet, Rfl: 3   tamsulosin (FLOMAX) 0.4 MG CAPS capsule, Take 1 capsule (0.4 mg total) by mouth at bedtime., Disp: 90 capsule, Rfl: 3 No current facility-administered medications for this visit.  Facility-Administered Medications Ordered in Other Visits:    acetaminophen (TYLENOL) tablet 650 mg, 650 mg, Oral, Once, Dyllon Henken M, PA-C   bortezomib SQ (VELCADE) chemo injection (2.5mg /mL concentration) 2.75 mg, 1.3 mg/m2 (Treatment Plan Recorded), Subcutaneous, Once, Adriel Desrosier M, PA-C   daratumumab-hyaluronidase-fihj [facility] FASPRO) 1800-30000 MG-UT/15ML chemo SQ injection 1,800 mg, 1,800 mg, Subcutaneous, Once, Zayde Stroupe  M, PA-C   diphenhydrAMINE (BENADRYL) capsule 50 mg, 50 mg, Oral, Once, Consuello Lassalle M, [place]  Allergies:  Allergies  Allergen Reactions   Latex Other (See Comments)    "takes the skin off" (tape)    Past Medical History, Surgical history, Social history, and Family History were reviewed and updated.  Review of Systems: Review of Systems  Constitutional: Negative.   HENT:  Negative.    Eyes: Negative.   Respiratory: Negative.    Cardiovascular: Negative.   Gastrointestinal: Negative.   Endocrine: Negative.   Genitourinary: Negative.    Musculoskeletal: Negative.   Skin: Negative.   Neurological: Negative.   Hematological: Negative.   Psychiatric/Behavioral: Negative.      Physical Exam:  Vitals:   12/16/23 0957  BP: (!) 106/59  Pulse: 64  Resp: 18  Temp: 97.7 F (36.5 C)  SpO2: 100%     Wt Readings from Last 3 Encounters:  12/16/23 203 lb (92.1 kg)  12/02/23 198 lb 12.8 oz (90.2 kg)  11/18/23 199 lb (90.3 kg)    Physical Exam Vitals reviewed.  HENT:     Head: Normocephalic and atraumatic.  Eyes:     Pupils: Pupils are equal, round, and reactive to light.  Cardiovascular:     Rate and Rhythm: Normal rate and regular rhythm.     Heart sounds: Normal heart sounds.  Pulmonary:     Effort: Pulmonary effort is normal.     Breath sounds: Normal breath sounds.  Abdominal:     General: Bowel sounds are normal.     Palpations: Abdomen is soft.     Comments: Abdominal exam is somewhat distended.  The umbilical hernia is reducible.  He has active bowel sounds.  There is no guarding or rebound tenderness.  He has no palpable liver or spleen tip.  There is no fluid wave.    Musculoskeletal:        General: No tenderness or deformity. Normal range of motion.     Cervical back: Normal range of motion.  Lymphadenopathy:     Cervical: No cervical adenopathy.  Skin:    General: Skin is warm and dry.     Findings: No erythema or rash.  Neurological:     Mental  Status: He is alert and oriented to person, place, and time.  Psychiatric:        Behavior: Behavior normal.        Thought Content: Thought content normal.        Judgment: Judgment normal.     Lab Results  Component Value Date   WBC 8.4 12/16/2023   HGB 11.9 (L) 12/16/2023   HCT 36.7 (L) 12/16/2023   MCV 99.7 12/16/2023   PLT 139 (L) 12/16/2023     Chemistry      Component Value  Date/Time   NA 142 12/16/2023 0939   K 4.4 12/16/2023 0939   CL 106 12/16/2023 0939   CO2 28 12/16/2023 0939   BUN 21 12/16/2023 0939   CREATININE 1.13 12/16/2023 0939   CREATININE 1.33 (H) 01/21/2017 1616      Component Value Date/Time   CALCIUM 9.1 12/16/2023 0939   ALKPHOS 70 12/16/2023 0939   AST 17 12/16/2023 0939   ALT 14 12/16/2023 0939   BILITOT 0.5 12/16/2023 6440     Encounter Diagnoses  Name Primary?   Multiple myeloma, remission status unspecified (HCC) Yes   Antineoplastic chemotherapy induced anemia    Chemotherapy-induced thrombocytopenia     Impression and Plan: Mr. Able is a very nice 83 year old white male.  He has IgG kappa myeloma.  His chromosomal abnormalities resolved with treatment. Currently he is being treated with Darzalex Faspro/ Velcade.   Hopeful that his M-spike will resolve on today's labs.  He continues to have thrombocytopenia and anemia suspected to be due to chemotherapy.  Labs acceptable for treatment today  Cycle 1 day 16 today RTC 2 weeks MD, labs (CBC w/, CMP, light chains, MM panel, LDH), Cycle 16 day [address] [place], [place] 3/13/202510:32 AM

## 2023-12-16 NOTE — Telephone Encounter (Signed)
 Discussed lab error with microalbumin creatinine ratio and he is overdue - check today

## 2023-12-16 NOTE — Patient Instructions (Signed)
 [facility]  Discharge Instructions: Thank you for choosing [facility] to provide your oncology and hematology care.   If you have a lab appointment with the Cancer Center, please go directly to the Cancer Center and check in at the registration area.  Wear comfortable clothing and clothing appropriate for easy access to any Portacath or PICC line.   We strive to give you quality time with your provider. You may need to reschedule your appointment if you arrive late (15 or more minutes).  Arriving late affects you and other patients whose appointments are after yours.  Also, if you miss three or more appointments without notifying the office, you may be dismissed from the clinic at the provider's discretion.      For prescription refill requests, have your pharmacy contact our office and allow 72 hours for refills to be completed.    Today you received the following chemotherapy and/or immunotherapy agents:  Faspro and Velcade      To help prevent nausea and vomiting after your treatment, we encourage you to take your nausea medication as directed.  BELOW ARE SYMPTOMS THAT SHOULD BE REPORTED IMMEDIATELY: *FEVER GREATER THAN 100.4 F (38 C) OR HIGHER *CHILLS OR SWEATING *NAUSEA AND VOMITING THAT IS NOT CONTROLLED WITH YOUR NAUSEA MEDICATION *UNUSUAL SHORTNESS OF BREATH *UNUSUAL BRUISING OR BLEEDING *URINARY PROBLEMS (pain or burning when urinating, or frequent urination) *BOWEL PROBLEMS (unusual diarrhea, constipation, pain near the anus) TENDERNESS IN MOUTH AND THROAT WITH OR WITHOUT PRESENCE OF ULCERS (sore throat, sores in mouth, or a toothache) UNUSUAL RASH, SWELLING OR PAIN  UNUSUAL VAGINAL DISCHARGE OR ITCHING   Items with * indicate a potential emergency and should be followed up as soon as possible or go to the Emergency Department if any problems should occur.  Please show the CHEMOTHERAPY ALERT CARD or  IMMUNOTHERAPY ALERT CARD at check-in to the Emergency Department and triage nurse. Should you have questions after your visit or need to cancel or reschedule your appointment, please contact [facility] - A DEPT OF [facility]  [phone] and follow the prompts.  Office hours are 8:00 a.m. to 4:30 p.m. Monday - Friday. Please note that voicemails left after 4:00 p.m. may not be returned until the following business day.  We are closed weekends and major holidays. You have access to a nurse at all times for urgent questions. Please call the main number to the clinic [phone] and follow the prompts.  For any non-urgent questions, you may also contact your provider using MyChart. We now offer e-Visits for anyone 24 and older to request care online for non-urgent symptoms. For details visit [email or web address].   Also download the MyChart app! Go to the app store, search "MyChart", open the app, select [place], and log in with your MyChart username and password.

## 2023-12-17 ENCOUNTER — Encounter: Payer: Self-pay | Admitting: Family Medicine

## 2023-12-17 LAB — MICROALBUMIN / CREATININE URINE RATIO
Creatinine,U: 46.5 mg/dL
Microalb Creat Ratio: UNDETERMINED mg/g (ref 0.0–30.0)
Microalb, Ur: <0.7 mg/dL

## 2023-12-17 LAB — KAPPA/LAMBDA LIGHT CHAINS
Kappa free light chain: 7.8 mg/L (ref 3.3–19.4)
Kappa, lambda light chain ratio: 2.11 — ABNORMAL HIGH (ref 0.26–1.65)
Lambda free light chains: 3.7 mg/L — ABNORMAL LOW (ref 5.7–26.3)

## 2023-12-20 ENCOUNTER — Encounter: Payer: Self-pay | Admitting: Medical Oncology

## 2023-12-20 LAB — MULTIPLE MYELOMA PANEL, SERUM
Albumin SerPl Elph-Mcnc: 3.5 g/dL (ref 2.9–4.4)
Albumin/Glob SerPl: 1.6 (ref 0.7–1.7)
Alpha 1: 0.2 g/dL (ref 0.0–0.4)
Alpha2 Glob SerPl Elph-Mcnc: 0.7 g/dL (ref 0.4–1.0)
B-Globulin SerPl Elph-Mcnc: 0.8 g/dL (ref 0.7–1.3)
Gamma Glob SerPl Elph-Mcnc: 0.4 g/dL (ref 0.4–1.8)
Globulin, Total: 2.2 g/dL (ref 2.2–3.9)
IgA: 14 mg/dL — ABNORMAL LOW (ref 61–437)
IgG (Immunoglobin G), Serum: 512 mg/dL — ABNORMAL LOW (ref 603–1613)
IgM (Immunoglobulin M), Srm: 5 mg/dL — ABNORMAL LOW (ref 15–143)
Total Protein ELP: 5.7 g/dL — ABNORMAL LOW (ref 6.0–8.5)

## 2023-12-30 ENCOUNTER — Inpatient Hospital Stay: Payer: Medicare Other

## 2023-12-30 VITALS — BP 116/51 | HR 62 | Temp 97.5°F | Resp 16

## 2023-12-30 DIAGNOSIS — Z5112 Encounter for antineoplastic immunotherapy: Secondary | ICD-10-CM | POA: Diagnosis not present

## 2023-12-30 DIAGNOSIS — C9000 Multiple myeloma not having achieved remission: Secondary | ICD-10-CM

## 2023-12-30 LAB — CMP (CANCER CENTER ONLY)
ALT: 16 U/L (ref 0–44)
AST: 18 U/L (ref 15–41)
Albumin: 3.6 g/dL (ref 3.5–5.0)
Alkaline Phosphatase: 60 U/L (ref 38–126)
Anion gap: 8 (ref 5–15)
BUN: 22 mg/dL (ref 8–23)
CO2: 27 mmol/L (ref 22–32)
Calcium: 9.1 mg/dL (ref 8.9–10.3)
Chloride: 104 mmol/L (ref 98–111)
Creatinine: 1.09 mg/dL (ref 0.61–1.24)
GFR, Estimated: >60 mL/min (ref >60)
Glucose, Bld: 198 mg/dL — ABNORMAL HIGH (ref 70–99)
Potassium: 4.2 mmol/L (ref 3.5–5.1)
Sodium: 139 mmol/L (ref 135–145)
Total Bilirubin: 0.6 mg/dL (ref 0.0–1.2)
Total Protein: 6.2 g/dL — ABNORMAL LOW (ref 6.5–8.1)

## 2023-12-30 LAB — CBC WITH DIFFERENTIAL (CANCER CENTER ONLY)
Abs Immature Granulocytes: 0.09 10*3/uL — ABNORMAL HIGH (ref 0.00–0.07)
Basophils Absolute: 0 10*3/uL (ref 0.0–0.1)
Basophils Relative: 1 %
Eosinophils Absolute: 0.1 10*3/uL (ref 0.0–0.5)
Eosinophils Relative: 2 %
HCT: 35.2 % — ABNORMAL LOW (ref 39.0–52.0)
Hemoglobin: 11.9 g/dL — ABNORMAL LOW (ref 13.0–17.0)
Immature Granulocytes: 1 %
Lymphocytes Relative: 21 %
Lymphs Abs: 1.8 10*3/uL (ref 0.7–4.0)
MCH: 33.1 pg (ref 26.0–34.0)
MCHC: 33.8 g/dL (ref 30.0–36.0)
MCV: 98.1 fL (ref 80.0–100.0)
Monocytes Absolute: 0.7 10*3/uL (ref 0.1–1.0)
Monocytes Relative: 8 %
Neutro Abs: 5.5 10*3/uL (ref 1.7–7.7)
Neutrophils Relative %: 67 %
Platelet Count: 131 10*3/uL — ABNORMAL LOW (ref 150–400)
RBC: 3.59 MIL/uL — ABNORMAL LOW (ref 4.22–5.81)
RDW: 12.7 % (ref 11.5–15.5)
WBC Count: 8.2 10*3/uL (ref 4.0–10.5)
nRBC: 0 % (ref 0.0–0.2)

## 2023-12-30 MED ORDER — PROCHLORPERAZINE MALEATE 10 MG PO TABS: 10.0000 mg | ORAL_TABLET | Freq: Once | ORAL | Status: DC

## 2023-12-30 MED ORDER — BORTEZOMIB CHEMO SQ INJECTION 3.5 MG (2.5MG/ML)
1.3000 mg/m2 | Freq: Once | INTRAMUSCULAR | Status: AC
  Administered 2023-12-30: 2.75 mg via SUBCUTANEOUS
  Filled 2023-12-30: qty 1.1

## 2023-12-30 NOTE — Patient Instructions (Signed)
 [facility]  Discharge Instructions: Thank you for choosing [facility] to provide your oncology and hematology care.   If you have a lab appointment with the Cancer Center, please go directly to the Cancer Center and check in at the registration area.  Wear comfortable clothing and clothing appropriate for easy access to any Portacath or PICC line.   We strive to give you quality time with your provider. You may need to reschedule your appointment if you arrive late (15 or more minutes).  Arriving late affects you and other patients whose appointments are after yours.  Also, if you miss three or more appointments without notifying the office, you may be dismissed from the clinic at the provider's discretion.      For prescription refill requests, have your pharmacy contact our office and allow 72 hours for refills to be completed.    Today you received the following chemotherapy and/or immunotherapy agents Velcade      To help prevent nausea and vomiting after your treatment, we encourage you to take your nausea medication as directed.  BELOW ARE SYMPTOMS THAT SHOULD BE REPORTED IMMEDIATELY: *FEVER GREATER THAN 100.4 F (38 C) OR HIGHER *CHILLS OR SWEATING *NAUSEA AND VOMITING THAT IS NOT CONTROLLED WITH YOUR NAUSEA MEDICATION *UNUSUAL SHORTNESS OF BREATH *UNUSUAL BRUISING OR BLEEDING *URINARY PROBLEMS (pain or burning when urinating, or frequent urination) *BOWEL PROBLEMS (unusual diarrhea, constipation, pain near the anus) TENDERNESS IN MOUTH AND THROAT WITH OR WITHOUT PRESENCE OF ULCERS (sore throat, sores in mouth, or a toothache) UNUSUAL RASH, SWELLING OR PAIN  UNUSUAL VAGINAL DISCHARGE OR ITCHING   Items with * indicate a potential emergency and should be followed up as soon as possible or go to the Emergency Department if any problems should occur.  Please show the CHEMOTHERAPY ALERT CARD or IMMUNOTHERAPY  ALERT CARD at check-in to the Emergency Department and triage nurse. Should you have questions after your visit or need to cancel or reschedule your appointment, please contact [facility] [facility] - A DEPT OF [facility]  [phone] and follow the prompts.  Office hours are 8:00 a.m. to 4:30 p.m. Monday - Friday. Please note that voicemails left after 4:00 p.m. may not be returned until the following business day.  We are closed weekends and major holidays. You have access to a nurse at all times for urgent questions. Please call the main number to the clinic [phone] and follow the prompts.  For any non-urgent questions, you may also contact your provider using MyChart. We now offer e-Visits for anyone 69 and older to request care online for non-urgent symptoms. For details visit [email or web address].   Also download the MyChart app! Go to the app store, search "MyChart", open the app, select [place], and log in with your MyChart username and password.

## 2024-01-13 ENCOUNTER — Inpatient Hospital Stay: Payer: Medicare Other | Attending: Hematology & Oncology | Admitting: Hematology & Oncology

## 2024-01-13 ENCOUNTER — Inpatient Hospital Stay: Payer: Medicare Other

## 2024-01-13 ENCOUNTER — Encounter: Payer: Self-pay | Admitting: Hematology & Oncology

## 2024-01-13 ENCOUNTER — Other Ambulatory Visit: Payer: Self-pay

## 2024-01-13 VITALS — BP 136/89 | HR 68 | Temp 97.4°F | Resp 18 | Ht 70.0 in | Wt 204.0 lb

## 2024-01-13 DIAGNOSIS — D630 Anemia in neoplastic disease: Secondary | ICD-10-CM | POA: Insufficient documentation

## 2024-01-13 DIAGNOSIS — Z5112 Encounter for antineoplastic immunotherapy: Secondary | ICD-10-CM | POA: Insufficient documentation

## 2024-01-13 DIAGNOSIS — C9000 Multiple myeloma not having achieved remission: Secondary | ICD-10-CM

## 2024-01-13 LAB — CBC WITH DIFFERENTIAL (CANCER CENTER ONLY)
Abs Immature Granulocytes: 0.04 10*3/uL (ref 0.00–0.07)
Basophils Absolute: 0.1 10*3/uL (ref 0.0–0.1)
Basophils Relative: 1 %
Eosinophils Absolute: 0.2 10*3/uL (ref 0.0–0.5)
Eosinophils Relative: 2 %
HCT: 37.5 % — ABNORMAL LOW (ref 39.0–52.0)
Hemoglobin: 12.8 g/dL — ABNORMAL LOW (ref 13.0–17.0)
Immature Granulocytes: 1 %
Lymphocytes Relative: 22 %
Lymphs Abs: 1.8 10*3/uL (ref 0.7–4.0)
MCH: 33 pg (ref 26.0–34.0)
MCHC: 34.1 g/dL (ref 30.0–36.0)
MCV: 96.6 fL (ref 80.0–100.0)
Monocytes Absolute: 0.8 10*3/uL (ref 0.1–1.0)
Monocytes Relative: 10 %
Neutro Abs: 5.4 10*3/uL (ref 1.7–7.7)
Neutrophils Relative %: 64 %
Platelet Count: 131 10*3/uL — ABNORMAL LOW (ref 150–400)
RBC: 3.88 MIL/uL — ABNORMAL LOW (ref 4.22–5.81)
RDW: 12.8 % (ref 11.5–15.5)
WBC Count: 8.3 10*3/uL (ref 4.0–10.5)
nRBC: 0 % (ref 0.0–0.2)

## 2024-01-13 LAB — CMP (CANCER CENTER ONLY)
ALT: 12 U/L (ref 0–44)
AST: 16 U/L (ref 15–41)
Albumin: 4.2 g/dL (ref 3.5–5.0)
Alkaline Phosphatase: 65 U/L (ref 38–126)
Anion gap: 7 (ref 5–15)
BUN: 21 mg/dL (ref 8–23)
CO2: 28 mmol/L (ref 22–32)
Calcium: 9.5 mg/dL (ref 8.9–10.3)
Chloride: 105 mmol/L (ref 98–111)
Creatinine: 1.16 mg/dL (ref 0.61–1.24)
GFR, Estimated: >60 mL/min (ref >60)
Glucose, Bld: 155 mg/dL — ABNORMAL HIGH (ref 70–99)
Potassium: 5.2 mmol/L — ABNORMAL HIGH (ref 3.5–5.1)
Sodium: 140 mmol/L (ref 135–145)
Total Bilirubin: 0.6 mg/dL (ref 0.0–1.2)
Total Protein: 6.1 g/dL — ABNORMAL LOW (ref 6.5–8.1)

## 2024-01-13 MED ORDER — DIPHENHYDRAMINE HCL 25 MG PO CAPS
50.0000 mg | ORAL_CAPSULE | Freq: Once | ORAL | Status: AC
  Administered 2024-01-13: 50 mg via ORAL
  Filled 2024-01-13: qty 2

## 2024-01-13 MED ORDER — BORTEZOMIB CHEMO SQ INJECTION 3.5 MG (2.5MG/ML)
1.3000 mg/m2 | Freq: Once | INTRAMUSCULAR | Status: AC
  Administered 2024-01-13: 2.75 mg via SUBCUTANEOUS
  Filled 2024-01-13: qty 1.1

## 2024-01-13 MED ORDER — ACETAMINOPHEN 325 MG PO TABS
650.0000 mg | ORAL_TABLET | Freq: Once | ORAL | Status: AC
  Administered 2024-01-13: 650 mg via ORAL
  Filled 2024-01-13: qty 2

## 2024-01-13 MED ORDER — DARATUMUMAB-HYALURONIDASE-FIHJ 1800-30000 MG-UT/15ML ~~LOC~~ SOLN
1800.0000 mg | Freq: Once | SUBCUTANEOUS | Status: AC
  Administered 2024-01-13: 1800 mg via SUBCUTANEOUS
  Filled 2024-01-13: qty 15

## 2024-01-13 NOTE — Progress Notes (Signed)
 Hematology and Oncology Follow Up Visit  Alexander Armstrong [id number] 1941/05/15 83 y.o. 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 --Start on 10/29/2022     Interim History:  Alexander Armstrong is back for follow-up. He is here with his wife.  He says that his hips are bothering him.  This might be from weight.  However, given the myeloma, we probably need to do a PET scan to see if there is anything on the PET scan that would suggest myeloma bone disease.  When we last saw him, there was no monoclonal spike in his blood.  His Ig G level was 512 mg/dL.  The Kappa light chain was 0.8 mg/dL.  Overall, he has had no problems with cough or shortness of breath.  He has had no change in bowel or bladder habits.  There may be a little bit of swelling in his legs.  He has had no bleeding.  There has been no fever.  Overall, I would say his performance status is probably ECOG 1.   Wt Readings from Last 3 Encounters:  01/13/24 204 lb (92.5 kg)  12/16/23 203 lb (92.1 kg)  12/02/23 198 lb 12.8 oz (90.2 kg)     Medications:  Current Outpatient Medications:    acetaminophen (TYLENOL) 500 MG tablet, Take 500 mg by mouth 3 (three) times daily as needed for moderate pain (pain score 4-6) or headache., Disp: , Rfl:    Cholecalciferol (VITAMIN D3) 1000 units CAPS, Take 1,000 Units by mouth at bedtime., Disp: , Rfl:    clopidogrel (PLAVIX) 75 MG tablet, Take 1 tablet (75 mg total) by mouth daily. TAKE 1 TABLET(75 MG) BY MOUTH DAILY, Disp: 90 tablet, Rfl: 3   Cyanocobalamin 1000 MCG TBCR, Take 1,000 mcg by mouth daily., Disp: , Rfl:    dexamethasone (DECADRON) 4 MG tablet, TAKE 5 TABS (20 MG)  WEEKLY THE DAY OF DARATUMUMAB. TAKE WITH BREAKFAST., Disp: 60 tablet, Rfl: 5   diltiazem (TIAZAC) 120 MG 24 hr capsule, Take 1 capsule (120 mg total) by mouth daily., Disp: 90 capsule, Rfl: 3   diphenhydrAMINE (BENADRYL ALLERGY) 25 MG tablet, Take 50 mg by mouth See admin instructions. Take 50 mg by mouth one hour prior to Daratumumab infusion on Mondays, Disp: , Rfl:    Docusate Sodium (DSS) 100 MG CAPS, Take by mouth daily., Disp: , Rfl:    famciclovir (FAMVIR) 250 MG tablet, TAKE 1 TABLET BY MOUTH EVERY DAY, Disp: 90 tablet, Rfl: 2   fluticasone (FLONASE) 50 MCG/ACT nasal spray, Place 1 spray into both nostrils daily as needed for allergies or rhinitis., Disp: , Rfl:    loratadine (CLARITIN) 10 MG tablet, Take 10 mg by mouth in the morning., Disp: , Rfl:    MAGNESIUM PO, Take by mouth daily., Disp: , Rfl:    meclizine (ANTIVERT) 25 MG tablet, Take 1 tablet (25 mg total) by mouth 3 (three) times daily as needed for dizziness., Disp: 30 tablet, Rfl: 5   montelukast (SINGULAIR) 10 MG tablet, Take 10 mg by mouth daily., Disp: , Rfl:    pantoprazole (PROTONIX) 40 MG tablet, TAKE 1 TABLET BY MOUTH EVERY DAY, Disp: 90 tablet, Rfl: 3   polyethylene glycol (MIRALAX / GLYCOLAX) 17 g packet,  Take 17 g by mouth daily as needed., Disp: , Rfl:    REFRESH PLUS 0.5 % SOLN, Place 1 drop into both eyes 3 (three) times daily as needed (for dryness)., Disp: , Rfl:    rosuvastatin (CRESTOR) 20 MG tablet, TAKE 1 TABLET BY MOUTH EVERY DAY, Disp: 90 tablet, Rfl: 3   tamsulosin (FLOMAX) 0.4 MG CAPS capsule, Take 1 capsule (0.4 mg total) by mouth at bedtime., Disp: 90 capsule, Rfl: 3  Allergies:  Allergies  Allergen Reactions   Latex Other (See Comments)    "takes the skin off" (tape)    Past Medical History, Surgical history, Social history, and Family History were reviewed and updated.  Review of Systems: Review of Systems  Constitutional: Negative.   HENT:  Negative.    Eyes: Negative.   Respiratory:  Negative.    Cardiovascular: Negative.   Gastrointestinal: Negative.   Endocrine: Negative.   Genitourinary: Negative.    Musculoskeletal: Negative.   Skin: Negative.   Neurological: Negative.   Hematological: Negative.   Psychiatric/Behavioral: Negative.      Physical Exam:  Vitals:   01/13/24 0900  BP: 136/89  Pulse: 68  Resp: 18  Temp: (!) 97.4 F (36.3 C)  SpO2: 99%     Wt Readings from Last 3 Encounters:  01/13/24 204 lb (92.5 kg)  12/16/23 203 lb (92.1 kg)  12/02/23 198 lb 12.8 oz (90.2 kg)    Physical Exam Vitals reviewed.  HENT:     Head: Normocephalic and atraumatic.  Eyes:     Pupils: Pupils are equal, round, and reactive to light.  Cardiovascular:     Rate and Rhythm: Normal rate and regular rhythm.     Heart sounds: Normal heart sounds.  Pulmonary:     Effort: Pulmonary effort is normal.     Breath sounds: Normal breath sounds.  Abdominal:     General: Bowel sounds are normal.     Palpations: Abdomen is soft.     Comments: Abdominal exam is somewhat distended.  The umbilical hernia is reducible.  He has active bowel sounds.  There is no guarding or rebound tenderness.  He has no palpable liver or spleen tip.  There is no fluid wave.    Musculoskeletal:        General: No tenderness or deformity. Normal range of motion.     Cervical back: Normal range of motion.  Lymphadenopathy:     Cervical: No cervical adenopathy.  Skin:    General: Skin is warm and dry.     Findings: No erythema or rash.  Neurological:     Mental Status: He is alert and oriented to person, place, and time.  Psychiatric:        Behavior: Behavior normal.        Thought Content: Thought content normal.        Judgment: Judgment normal.     Lab Results  Component Value Date   WBC 8.3 01/13/2024   HGB 12.8 (L) 01/13/2024   HCT 37.5 (L) 01/13/2024   MCV 96.6 01/13/2024   PLT 131 (L) 01/13/2024     Chemistry      Component Value Date/Time   NA 140 01/13/2024 0939    K 5.2 (H) 01/13/2024 0939   CL 105 01/13/2024 0939   CO2 28 01/13/2024 0939   BUN 21 01/13/2024 0939   CREATININE 1.16 01/13/2024 0939   CREATININE 1.33 (H) 01/21/2017 1616      Component Value Date/Time   CALCIUM  9.5 01/13/2024 0939   ALKPHOS 65 01/13/2024 0939   AST 16 01/13/2024 0939   ALT 12 01/13/2024 0939   BILITOT 0.6 01/13/2024 0939     Impression and Plan: Alexander Armstrong is a very nice 83 year old white male.  He has IgG kappa myeloma.  His chromosomal abnormalities resolved with treatment. Currently he is being treated with Darzalex Faspro/ Velcade.   I am not sure what is going on with the hips.  I cannot find anything on his physical exam that is unusual.  Again, it might be the weight that could be the problem with the hips.  However, we will check a PET scan on him and see if there is anything with the bones.  I am glad that the last M spike did not show a measurable monoclonal protein.  We really have on but I will consider maintenance therapy right now.  I think he is in quite well with this.  As far as another bone marrow test goes, I may have to think about when to do 1.  Will plan to get him back to see [place] in another month.   Alexander Macho, MD 4/10/202510:15 AM

## 2024-01-13 NOTE — Patient Instructions (Signed)
 [facility]  Discharge Instructions: Thank you for choosing [facility] to provide your oncology and hematology care.   If you have a lab appointment with the Cancer Center, please go directly to the Cancer Center and check in at the registration area.  Wear comfortable clothing and clothing appropriate for easy access to any Portacath or PICC line.   We strive to give you quality time with your provider. You may need to reschedule your appointment if you arrive late (15 or more minutes).  Arriving late affects you and other patients whose appointments are after yours.  Also, if you miss three or more appointments without notifying the office, you may be dismissed from the clinic at the provider's discretion.      For prescription refill requests, have your pharmacy contact our office and allow 72 hours for refills to be completed.    Today you received the following chemotherapy and/or immunotherapy agents:  Faspro and Velcade      To help prevent nausea and vomiting after your treatment, we encourage you to take your nausea medication as directed.  BELOW ARE SYMPTOMS THAT SHOULD BE REPORTED IMMEDIATELY: *FEVER GREATER THAN 100.4 F (38 C) OR HIGHER *CHILLS OR SWEATING *NAUSEA AND VOMITING THAT IS NOT CONTROLLED WITH YOUR NAUSEA MEDICATION *UNUSUAL SHORTNESS OF BREATH *UNUSUAL BRUISING OR BLEEDING *URINARY PROBLEMS (pain or burning when urinating, or frequent urination) *BOWEL PROBLEMS (unusual diarrhea, constipation, pain near the anus) TENDERNESS IN MOUTH AND THROAT WITH OR WITHOUT PRESENCE OF ULCERS (sore throat, sores in mouth, or a toothache) UNUSUAL RASH, SWELLING OR PAIN  UNUSUAL VAGINAL DISCHARGE OR ITCHING   Items with * indicate a potential emergency and should be followed up as soon as possible or go to the Emergency Department if any problems should occur.  Please show the CHEMOTHERAPY ALERT CARD or  IMMUNOTHERAPY ALERT CARD at check-in to the Emergency Department and triage nurse. Should you have questions after your visit or need to cancel or reschedule your appointment, please contact [facility] - A DEPT OF [facility]  [phone] and follow the prompts.  Office hours are 8:00 a.m. to 4:30 p.m. Monday - Friday. Please note that voicemails left after 4:00 p.m. may not be returned until the following business day.  We are closed weekends and major holidays. You have access to a nurse at all times for urgent questions. Please call the main number to the clinic [phone] and follow the prompts.  For any non-urgent questions, you may also contact your provider using MyChart. We now offer e-Visits for anyone 24 and older to request care online for non-urgent symptoms. For details visit [email or web address].   Also download the MyChart app! Go to the app store, search "MyChart", open the app, select [place], and log in with your MyChart username and password.

## 2024-01-14 ENCOUNTER — Other Ambulatory Visit: Payer: Self-pay

## 2024-01-20 ENCOUNTER — Encounter (HOSPITAL_COMMUNITY)
Admission: RE | Admit: 2024-01-20 | Discharge: 2024-01-20 | Disposition: A | Discharge status: Home / Self Care | Source: Ambulatory Visit | Attending: Hematology & Oncology | Admitting: Hematology & Oncology

## 2024-01-20 DIAGNOSIS — C9000 Multiple myeloma not having achieved remission: Secondary | ICD-10-CM | POA: Diagnosis present

## 2024-01-20 LAB — GLUCOSE, CAPILLARY: Glucose-Capillary: 152 mg/dL — ABNORMAL HIGH (ref 70–99)

## 2024-01-20 MED ORDER — FLUDEOXYGLUCOSE F - 18 (FDG) INJECTION
10.0600 | Freq: Once | INTRAVENOUS | Status: AC
  Administered 2024-01-20: 10.06 via INTRAVENOUS

## 2024-01-27 ENCOUNTER — Inpatient Hospital Stay: Payer: Medicare Other

## 2024-01-27 VITALS — BP 121/78 | HR 71 | Temp 97.5°F | Resp 18

## 2024-01-27 DIAGNOSIS — C9000 Multiple myeloma not having achieved remission: Secondary | ICD-10-CM

## 2024-01-27 DIAGNOSIS — Z5112 Encounter for antineoplastic immunotherapy: Secondary | ICD-10-CM | POA: Diagnosis not present

## 2024-01-27 LAB — CMP (CANCER CENTER ONLY)
ALT: 12 U/L (ref 0–44)
AST: 15 U/L (ref 15–41)
Albumin: 4.2 g/dL (ref 3.5–5.0)
Alkaline Phosphatase: 57 U/L (ref 38–126)
Anion gap: 7 (ref 5–15)
BUN: 20 mg/dL (ref 8–23)
CO2: 28 mmol/L (ref 22–32)
Calcium: 9.2 mg/dL (ref 8.9–10.3)
Chloride: 104 mmol/L (ref 98–111)
Creatinine: 1.12 mg/dL (ref 0.61–1.24)
GFR, Estimated: >60 mL/min (ref >60)
Glucose, Bld: 155 mg/dL — ABNORMAL HIGH (ref 70–99)
Potassium: 4.5 mmol/L (ref 3.5–5.1)
Sodium: 139 mmol/L (ref 135–145)
Total Bilirubin: 0.4 mg/dL (ref 0.0–1.2)
Total Protein: 5.8 g/dL — ABNORMAL LOW (ref 6.5–8.1)

## 2024-01-27 LAB — CBC WITH DIFFERENTIAL (CANCER CENTER ONLY)
Abs Immature Granulocytes: 0.01 10*3/uL (ref 0.00–0.07)
Basophils Absolute: 0.1 10*3/uL (ref 0.0–0.1)
Basophils Relative: 1 %
Eosinophils Absolute: 0.2 10*3/uL (ref 0.0–0.5)
Eosinophils Relative: 3 %
HCT: 36.7 % — ABNORMAL LOW (ref 39.0–52.0)
Hemoglobin: 12.5 g/dL — ABNORMAL LOW (ref 13.0–17.0)
Immature Granulocytes: 0 %
Lymphocytes Relative: 24 %
Lymphs Abs: 2 10*3/uL (ref 0.7–4.0)
MCH: 32.6 pg (ref 26.0–34.0)
MCHC: 34.1 g/dL (ref 30.0–36.0)
MCV: 95.6 fL (ref 80.0–100.0)
Monocytes Absolute: 0.9 10*3/uL (ref 0.1–1.0)
Monocytes Relative: 12 %
Neutro Abs: 4.9 10*3/uL (ref 1.7–7.7)
Neutrophils Relative %: 60 %
Platelet Count: 178 10*3/uL (ref 150–400)
RBC: 3.84 MIL/uL — ABNORMAL LOW (ref 4.22–5.81)
RDW: 12.6 % (ref 11.5–15.5)
WBC Count: 8.1 10*3/uL (ref 4.0–10.5)
nRBC: 0 % (ref 0.0–0.2)

## 2024-01-27 MED ORDER — PROCHLORPERAZINE MALEATE 10 MG PO TABS: 10.0000 mg | ORAL_TABLET | Freq: Once | ORAL | Status: DC

## 2024-01-27 MED ORDER — BORTEZOMIB CHEMO SQ INJECTION 3.5 MG (2.5MG/ML)
1.3000 mg/m2 | Freq: Once | INTRAMUSCULAR | Status: AC
Start: 2024-01-27 — End: 2024-01-27
  Administered 2024-01-27: 2.75 mg via SUBCUTANEOUS
  Filled 2024-01-27: qty 1.1

## 2024-01-27 NOTE — Patient Instructions (Signed)
 [facility]  Discharge Instructions: Thank you for choosing [facility] to provide your oncology and hematology care.   If you have a lab appointment with the Cancer Center, please go directly to the Cancer Center and check in at the registration area.  Wear comfortable clothing and clothing appropriate for easy access to any Portacath or PICC line.   We strive to give you quality time with your provider. You may need to reschedule your appointment if you arrive late (15 or more minutes).  Arriving late affects you and other patients whose appointments are after yours.  Also, if you miss three or more appointments without notifying the office, you may be dismissed from the clinic at the provider's discretion.      For prescription refill requests, have your pharmacy contact our office and allow 72 hours for refills to be completed.    Today you received the following chemotherapy and/or immunotherapy agents Velcade      To help prevent nausea and vomiting after your treatment, we encourage you to take your nausea medication as directed.  BELOW ARE SYMPTOMS THAT SHOULD BE REPORTED IMMEDIATELY: *FEVER GREATER THAN 100.4 F (38 C) OR HIGHER *CHILLS OR SWEATING *NAUSEA AND VOMITING THAT IS NOT CONTROLLED WITH YOUR NAUSEA MEDICATION *UNUSUAL SHORTNESS OF BREATH *UNUSUAL BRUISING OR BLEEDING *URINARY PROBLEMS (pain or burning when urinating, or frequent urination) *BOWEL PROBLEMS (unusual diarrhea, constipation, pain near the anus) TENDERNESS IN MOUTH AND THROAT WITH OR WITHOUT PRESENCE OF ULCERS (sore throat, sores in mouth, or a toothache) UNUSUAL RASH, SWELLING OR PAIN  UNUSUAL VAGINAL DISCHARGE OR ITCHING   Items with * indicate a potential emergency and should be followed up as soon as possible or go to the Emergency Department if any problems should occur.  Please show the CHEMOTHERAPY ALERT CARD or IMMUNOTHERAPY  ALERT CARD at check-in to the Emergency Department and triage nurse. Should you have questions after your visit or need to cancel or reschedule your appointment, please contact [facility] [facility] - A DEPT OF [facility]  [phone] and follow the prompts.  Office hours are 8:00 a.m. to 4:30 p.m. Monday - Friday. Please note that voicemails left after 4:00 p.m. may not be returned until the following business day.  We are closed weekends and major holidays. You have access to a nurse at all times for urgent questions. Please call the main number to the clinic [phone] and follow the prompts.  For any non-urgent questions, you may also contact your provider using MyChart. We now offer e-Visits for anyone 69 and older to request care online for non-urgent symptoms. For details visit [email or web address].   Also download the MyChart app! Go to the app store, search "MyChart", open the app, select [place], and log in with your MyChart username and password.

## 2024-01-28 ENCOUNTER — Other Ambulatory Visit: Payer: Self-pay

## 2024-01-31 ENCOUNTER — Encounter: Payer: Self-pay | Admitting: *Deleted

## 2024-02-10 ENCOUNTER — Inpatient Hospital Stay (HOSPITAL_BASED_OUTPATIENT_CLINIC_OR_DEPARTMENT_OTHER): Admitting: Medical Oncology

## 2024-02-10 ENCOUNTER — Ambulatory Visit (HOSPITAL_BASED_OUTPATIENT_CLINIC_OR_DEPARTMENT_OTHER)
Admission: RE | Admit: 2024-02-10 | Discharge: 2024-02-10 | Disposition: A | Discharge status: Home / Self Care | Source: Ambulatory Visit | Attending: Medical Oncology | Admitting: Medical Oncology

## 2024-02-10 ENCOUNTER — Inpatient Hospital Stay: Attending: Hematology & Oncology

## 2024-02-10 ENCOUNTER — Encounter (HOSPITAL_COMMUNITY): Payer: Self-pay

## 2024-02-10 ENCOUNTER — Inpatient Hospital Stay

## 2024-02-10 ENCOUNTER — Encounter: Payer: Self-pay | Admitting: Medical Oncology

## 2024-02-10 VITALS — BP 90/63 | HR 81 | Temp 97.2°F | Resp 18 | Ht 70.0 in | Wt 201.1 lb

## 2024-02-10 DIAGNOSIS — D72829 Elevated white blood cell count, unspecified: Secondary | ICD-10-CM | POA: Insufficient documentation

## 2024-02-10 DIAGNOSIS — R350 Frequency of micturition: Secondary | ICD-10-CM

## 2024-02-10 DIAGNOSIS — Q999 Chromosomal abnormality, unspecified: Secondary | ICD-10-CM | POA: Diagnosis not present

## 2024-02-10 DIAGNOSIS — C9000 Multiple myeloma not having achieved remission: Secondary | ICD-10-CM

## 2024-02-10 DIAGNOSIS — Z5112 Encounter for antineoplastic immunotherapy: Secondary | ICD-10-CM | POA: Insufficient documentation

## 2024-02-10 LAB — URINALYSIS, COMPLETE (UACMP) WITH MICROSCOPIC
Bilirubin Urine: NEGATIVE
Glucose, UA: 100 mg/dL — AB
Hgb urine dipstick: NEGATIVE
Ketones, ur: NEGATIVE mg/dL
Leukocytes,Ua: NEGATIVE
Nitrite: NEGATIVE
Protein, ur: NEGATIVE mg/dL
Specific Gravity, Urine: 1.025 (ref 1.005–1.030)
pH: 6 (ref 5.0–8.0)

## 2024-02-10 LAB — CMP (CANCER CENTER ONLY)
ALT: 11 U/L (ref 0–44)
AST: 15 U/L (ref 15–41)
Albumin: 4.4 g/dL (ref 3.5–5.0)
Alkaline Phosphatase: 74 U/L (ref 38–126)
Anion gap: 9 (ref 5–15)
BUN: 23 mg/dL (ref 8–23)
CO2: 28 mmol/L (ref 22–32)
Calcium: 9.2 mg/dL (ref 8.9–10.3)
Chloride: 104 mmol/L (ref 98–111)
Creatinine: 1.27 mg/dL — ABNORMAL HIGH (ref 0.61–1.24)
GFR, Estimated: 56 mL/min — ABNORMAL LOW (ref >60)
Glucose, Bld: 234 mg/dL — ABNORMAL HIGH (ref 70–99)
Potassium: 3.7 mmol/L (ref 3.5–5.1)
Sodium: 141 mmol/L (ref 135–145)
Total Bilirubin: 0.5 mg/dL (ref 0.0–1.2)
Total Protein: 6.4 g/dL — ABNORMAL LOW (ref 6.5–8.1)

## 2024-02-10 LAB — CBC WITH DIFFERENTIAL (CANCER CENTER ONLY)
Abs Immature Granulocytes: 0.03 10*3/uL (ref 0.00–0.07)
Basophils Absolute: 0.1 10*3/uL (ref 0.0–0.1)
Basophils Relative: 1 %
Eosinophils Absolute: 0.2 10*3/uL (ref 0.0–0.5)
Eosinophils Relative: 2 %
HCT: 37.3 % — ABNORMAL LOW (ref 39.0–52.0)
Hemoglobin: 12.5 g/dL — ABNORMAL LOW (ref 13.0–17.0)
Immature Granulocytes: 0 %
Lymphocytes Relative: 15 %
Lymphs Abs: 1.7 10*3/uL (ref 0.7–4.0)
MCH: 32.6 pg (ref 26.0–34.0)
MCHC: 33.5 g/dL (ref 30.0–36.0)
MCV: 97.4 fL (ref 80.0–100.0)
Monocytes Absolute: 1 10*3/uL (ref 0.1–1.0)
Monocytes Relative: 9 %
Neutro Abs: 8.2 10*3/uL — ABNORMAL HIGH (ref 1.7–7.7)
Neutrophils Relative %: 73 %
Platelet Count: 144 10*3/uL — ABNORMAL LOW (ref 150–400)
RBC: 3.83 MIL/uL — ABNORMAL LOW (ref 4.22–5.81)
RDW: 12.9 % (ref 11.5–15.5)
WBC Count: 11.2 10*3/uL — ABNORMAL HIGH (ref 4.0–10.5)
nRBC: 0 % (ref 0.0–0.2)

## 2024-02-10 LAB — LACTATE DEHYDROGENASE: LDH: 135 U/L (ref 98–192)

## 2024-02-10 MED ORDER — ACETAMINOPHEN 325 MG PO TABS: 650.0000 mg | ORAL_TABLET | Freq: Once | ORAL | Status: DC

## 2024-02-10 MED ORDER — DIPHENHYDRAMINE HCL 25 MG PO CAPS: 50.0000 mg | ORAL_CAPSULE | Freq: Once | ORAL | Status: DC

## 2024-02-10 MED ORDER — DARATUMUMAB-HYALURONIDASE-FIHJ 1800-30000 MG-UT/15ML ~~LOC~~ SOLN
1800.0000 mg | Freq: Once | SUBCUTANEOUS | Status: AC
  Administered 2024-02-10: 1800 mg via SUBCUTANEOUS
  Filled 2024-02-10: qty 15

## 2024-02-10 MED ORDER — BORTEZOMIB CHEMO SQ INJECTION 3.5 MG (2.5MG/ML)
1.3000 mg/m2 | Freq: Once | INTRAMUSCULAR | Status: AC
  Administered 2024-02-10: 2.75 mg via SUBCUTANEOUS
  Filled 2024-02-10: qty 1.1

## 2024-02-10 NOTE — Patient Instructions (Signed)
 [facility] - A DEPT OF [facility]  Discharge Instructions: Thank you for choosing [facility] to provide your oncology and hematology care.   If you have a lab appointment with the Cancer Center, please go directly to the Cancer Center and check in at the registration area.  Wear comfortable clothing and clothing appropriate for easy access to any Portacath or PICC line.   We strive to give you quality time with your provider. You may need to reschedule your appointment if you arrive late (15 or more minutes).  Arriving late affects you and other patients whose appointments are after yours.  Also, if you miss three or more appointments without notifying the office, you may be dismissed from the clinic at the provider's discretion.      For prescription refill requests, have your pharmacy contact our office and allow 72 hours for refills to be completed.    Today you received the following chemotherapy and/or immunotherapy agents Velcade/Darzalex      To help prevent nausea and vomiting after your treatment, we encourage you to take your nausea medication as directed.  BELOW ARE SYMPTOMS THAT SHOULD BE REPORTED IMMEDIATELY: *FEVER GREATER THAN 100.4 F (38 C) OR HIGHER *CHILLS OR SWEATING *NAUSEA AND VOMITING THAT IS NOT CONTROLLED WITH YOUR NAUSEA MEDICATION *UNUSUAL SHORTNESS OF BREATH *UNUSUAL BRUISING OR BLEEDING *URINARY PROBLEMS (pain or burning when urinating, or frequent urination) *BOWEL PROBLEMS (unusual diarrhea, constipation, pain near the anus) TENDERNESS IN MOUTH AND THROAT WITH OR WITHOUT PRESENCE OF ULCERS (sore throat, sores in mouth, or a toothache) UNUSUAL RASH, SWELLING OR PAIN  UNUSUAL VAGINAL DISCHARGE OR ITCHING   Items with * indicate a potential emergency and should be followed up as soon as possible or go to the Emergency Department if any problems should occur.  Please show the CHEMOTHERAPY ALERT CARD or  IMMUNOTHERAPY ALERT CARD at check-in to the Emergency Department and triage nurse. Should you have questions after your visit or need to cancel or reschedule your appointment, please contact [facility] - A DEPT OF [facility]  [phone] and follow the prompts.  Office hours are 8:00 a.m. to 4:30 p.m. Monday - Friday. Please note that voicemails left after 4:00 p.m. may not be returned until the following business day.  We are closed weekends and major holidays. You have access to a nurse at all times for urgent questions. Please call the main number to the clinic [phone] and follow the prompts.  For any non-urgent questions, you may also contact your provider using MyChart. We now offer e-Visits for anyone 71 and older to request care online for non-urgent symptoms. For details visit [email or web address].   Also download the MyChart app! Go to the app store, search "MyChart", open the app, select , and log in with your MyChart username and password.

## 2024-02-10 NOTE — Progress Notes (Signed)
 Hematology and Oncology Follow Up Visit  Alexander Armstrong [id number] 02/24/1941 83 y.o. 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 --Start on 10/29/2022     Interim History:  Alexander Armstrong is back for follow-up and consideration of treatment.   He reports that he is overall feeling well. He has bit a bit more stressed than normal trying to get 3 projects done on time. He reports no significant cough, SOB, fevers, dysuria, abdominal pain. Urinary frequency is a bit higher than normal.   His last monoclonal spike was negative on 12/16/2023. His IgG level was 512 mg/dL.  The Kappa light chain was 0.8 mg/dL.  Overall, he has had no problems with cough or shortness of breath.  He has had no change in bowel or bladder habits.  There may be a little bit of swelling in his legs. He follows a high salt diet to help with his chronic hypotension.   He has had no bleeding.  There has been no fever.   Weight is down slightly. He is trying to make some changes to his diet to help with this.   Overall, I would say his performance status is probably ECOG 1.   Wt Readings from Last 3 Encounters:  02/10/24 201 lb 1.3 oz (91.2 kg)  01/13/24 204 lb (92.5 kg)  12/16/23 203 lb (92.1 kg)     Medications:  Current Outpatient Medications:    acetaminophen  (TYLENOL ) 500 MG tablet, Take 500 mg by mouth 3 (three) times daily as needed for moderate pain (pain score 4-6) or headache., Disp: , Rfl:    Cholecalciferol (VITAMIN D3) 1000 units CAPS, Take 1,000 Units by mouth at bedtime., Disp: , Rfl:    clopidogrel  (PLAVIX ) 75 MG tablet, Take 1 tablet (75 mg total) by mouth daily. TAKE 1 TABLET(75 MG)  BY MOUTH DAILY, Disp: 90 tablet, Rfl: 3   Cyanocobalamin  1000 MCG TBCR, Take 1,000 mcg by mouth daily., Disp: , Rfl:    dexamethasone  (DECADRON ) 4 MG tablet, TAKE 5 TABS (20 MG) WEEKLY THE DAY OF DARATUMUMAB . TAKE WITH BREAKFAST., Disp: 60 tablet, Rfl: 5   diltiazem  (TIAZAC ) 120 MG 24 hr capsule, Take 1 capsule (120 mg total) by mouth daily., Disp: 90 capsule, Rfl: 3   diphenhydrAMINE  (BENADRYL  ALLERGY) 25 MG tablet, Take 50 mg by mouth See admin instructions. Take 50 mg by mouth one hour prior to Daratumumab  infusion on Mondays, Disp: , Rfl:    Docusate Sodium  (DSS) 100 MG CAPS, Take by mouth daily., Disp: , Rfl:    famciclovir  (FAMVIR ) 250 MG tablet, TAKE 1 TABLET BY MOUTH EVERY DAY, Disp: 90 tablet, Rfl: 2   fluticasone (FLONASE) 50 MCG/ACT nasal spray, Place 1 spray into both nostrils daily as needed for allergies or rhinitis., Disp: , Rfl:    loratadine (CLARITIN) 10 MG tablet, Take 10 mg by mouth in the morning., Disp: , Rfl:    MAGNESIUM PO, Take by mouth daily., Disp: , Rfl:    meclizine  (ANTIVERT ) 25 MG tablet, Take 1 tablet (25 mg total) by mouth 3 (three) times daily as needed for dizziness., Disp: 30 tablet, Rfl: 5   montelukast  (SINGULAIR ) 10 MG tablet, Take 10 mg by mouth daily., Disp: , Rfl:  pantoprazole  (PROTONIX ) 40 MG tablet, TAKE 1 TABLET BY MOUTH EVERY DAY, Disp: 90 tablet, Rfl: 3   polyethylene glycol (MIRALAX  / GLYCOLAX ) 17 g packet, Take 17 g by mouth daily as needed., Disp: , Rfl:    REFRESH PLUS 0.5 % SOLN, Place 1 drop into both eyes 3 (three) times daily as needed (for dryness)., Disp: , Rfl:    rosuvastatin  (CRESTOR ) 20 MG tablet, TAKE 1 TABLET BY MOUTH EVERY DAY, Disp: 90 tablet, Rfl: 3   tamsulosin  (FLOMAX ) 0.4 MG CAPS capsule, Take 1 capsule (0.4 mg total) by mouth at bedtime., Disp: 90 capsule, Rfl: 3 No current facility-administered medications for this visit.  Facility-Administered Medications Ordered in Other Visits:    bortezomib  SQ (VELCADE ) chemo injection  (2.5mg /mL concentration) 2.75 mg, 1.3 mg/m2 (Treatment Plan Recorded), Subcutaneous, Once, Ennever, Sherryll Donald, MD   daratumumab -hyaluronidase -fihj (DARZALEX  FASPRO) 1800-30000 MG-UT/15ML chemo SQ injection 1,800 mg, 1,800 mg, Subcutaneous, Once, Ennever, Sherryll Donald, MD  Allergies:  Allergies  Allergen Reactions   Latex Other (See Comments)    "takes the skin off" (tape)    Past Medical History, Surgical history, Social history, and Family History were reviewed and updated.  Review of Systems: Review of Systems  Constitutional: Negative.   HENT:  Negative.    Eyes: Negative.   Respiratory: Negative.    Cardiovascular: Negative.   Gastrointestinal: Negative.   Endocrine: Negative.   Genitourinary: Negative.    Musculoskeletal: Negative.   Skin: Negative.   Neurological: Negative.   Hematological: Negative.   Psychiatric/Behavioral: Negative.      Physical Exam:  Vitals:   02/10/24 0913  BP: 90/63  Pulse: 81  Resp: 18  Temp: (!) 97.2 F (36.2 C)  SpO2: 100%     Wt Readings from Last 3 Encounters:  02/10/24 201 lb 1.3 oz (91.2 kg)  01/13/24 204 lb (92.5 kg)  12/16/23 203 lb (92.1 kg)    Physical Exam Vitals reviewed.  HENT:     Head: Normocephalic and atraumatic.  Eyes:     Pupils: Pupils are equal, round, and reactive to light.  Cardiovascular:     Rate and Rhythm: Normal rate and regular rhythm.     Heart sounds: Normal heart sounds.  Pulmonary:     Effort: Pulmonary effort is normal.     Breath sounds: Normal breath sounds.  Abdominal:     General: Bowel sounds are normal.     Palpations: Abdomen is soft.  Musculoskeletal:        General: No tenderness or deformity. Normal range of motion.     Cervical back: Normal range of motion.  Lymphadenopathy:     Cervical: No cervical adenopathy.  Skin:    General: Skin is warm and dry.     Findings: No erythema or rash.  Neurological:     Mental Status: He is alert and oriented to person, place, and time.   Psychiatric:        Behavior: Behavior normal.        Thought Content: Thought content normal.        Judgment: Judgment normal.     Lab Results  Component Value Date   WBC 11.2 (H) 02/10/2024   HGB 12.5 (L) 02/10/2024   HCT 37.3 (L) 02/10/2024   MCV 97.4 02/10/2024   PLT 144 (L) 02/10/2024     Chemistry      Component Value Date/Time   NA 141 02/10/2024 0855   K 3.7 02/10/2024 0855   CL 104 02/10/2024  0855   CO2 28 02/10/2024 0855   BUN 23 02/10/2024 0855   CREATININE 1.27 (H) 02/10/2024 0855   CREATININE 1.33 (H) 01/21/2017 1616      Component Value Date/Time   CALCIUM  9.2 02/10/2024 0855   ALKPHOS 74 02/10/2024 0855   AST 15 02/10/2024 0855   ALT 11 02/10/2024 0855   BILITOT 0.5 02/10/2024 0855     Encounter Diagnoses  Name Primary?   Leukocytosis, unspecified type Yes   Urinary frequency    Multiple myeloma, remission status unspecified (HCC)     Impression and Plan: Mr. Lummis is a very nice 83 year old white male.  He has IgG kappa myeloma.  His chromosomal abnormalities resolved with treatment. Currently he is being treated with Darzalex  Faspro/ Velcade .   01/30/2024 PET scan did not show evidence of bony abnormality.   Monoclonal labs look stable.   CBC shows mild leukocytosis which certainly could be from increased stress however given his concurrently elevated glucose level I have suggested work up for potential infections with a chest x ray and urine culture. He is agreeable. Reviewed red flag signs and symptoms. He will lower his carbohydrate intake, stay well hydrated and alert us  if red flags occur.   Treatment today UA/Chest x ray RTC 1 month MD, port labs(CBC w/, CMP, LDH, MM panel, light chains), Darzalex  Faspro/ Velcade   Sharla Davis, PA-C 5/8/202510:02 AM

## 2024-02-11 LAB — KAPPA/LAMBDA LIGHT CHAINS
Kappa free light chain: 8.4 mg/L (ref 3.3–19.4)
Kappa, lambda light chain ratio: 2.1 — ABNORMAL HIGH (ref 0.26–1.65)
Lambda free light chains: 4 mg/L — ABNORMAL LOW (ref 5.7–26.3)

## 2024-02-11 LAB — URINE CULTURE: Culture: NO GROWTH

## 2024-02-11 LAB — IGG, IGA, IGM
IgA: 15 mg/dL — ABNORMAL LOW (ref 61–437)
IgG (Immunoglobin G), Serum: 500 mg/dL — ABNORMAL LOW (ref 603–1613)
IgM (Immunoglobulin M), Srm: 7 mg/dL — ABNORMAL LOW (ref 15–143)

## 2024-02-14 ENCOUNTER — Encounter: Payer: Self-pay | Admitting: Medical Oncology

## 2024-02-15 ENCOUNTER — Other Ambulatory Visit: Payer: Self-pay

## 2024-02-15 LAB — PROTEIN ELECTROPHORESIS, SERUM, WITH REFLEX
A/G Ratio: 1.7 (ref 0.7–1.7)
Albumin ELP: 3.6 g/dL (ref 2.9–4.4)
Alpha-1-Globulin: 0.2 g/dL (ref 0.0–0.4)
Alpha-2-Globulin: 0.7 g/dL (ref 0.4–1.0)
Beta Globulin: 0.8 g/dL (ref 0.7–1.3)
Gamma Globulin: 0.3 g/dL — ABNORMAL LOW (ref 0.4–1.8)
Globulin, Total: 2.1 g/dL — ABNORMAL LOW (ref 2.2–3.9)
Total Protein ELP: 5.7 g/dL — ABNORMAL LOW (ref 6.0–8.5)

## 2024-02-17 ENCOUNTER — Encounter: Payer: Self-pay | Admitting: Family Medicine

## 2024-02-17 ENCOUNTER — Ambulatory Visit: Payer: Medicare Other | Admitting: Family Medicine

## 2024-02-17 VITALS — BP 100/60 | HR 71 | Temp 98.6°F | Ht 70.0 in | Wt 199.0 lb

## 2024-02-17 DIAGNOSIS — C9000 Multiple myeloma not having achieved remission: Secondary | ICD-10-CM | POA: Diagnosis not present

## 2024-02-17 DIAGNOSIS — I2510 Atherosclerotic heart disease of native coronary artery without angina pectoris: Secondary | ICD-10-CM | POA: Diagnosis not present

## 2024-02-17 DIAGNOSIS — E119 Type 2 diabetes mellitus without complications: Secondary | ICD-10-CM | POA: Diagnosis not present

## 2024-02-17 LAB — POCT GLYCOSYLATED HEMOGLOBIN (HGB A1C): Hemoglobin A1C: 7.7 % — AB (ref 4.0–5.6)

## 2024-02-17 MED ORDER — LANCET DEVICE MISC
1.0000 | Freq: Three times a day (TID) | 0 refills | Status: AC
Start: 2024-02-17 — End: 2024-03-18

## 2024-02-17 MED ORDER — BLOOD GLUCOSE MONITORING SUPPL DEVI: 1.0000 | Freq: Three times a day (TID) | 0 refills | Status: DC

## 2024-02-17 MED ORDER — LANCETS MISC. MISC: 1.0000 | Freq: Three times a day (TID) | 0 refills | Status: AC

## 2024-02-17 MED ORDER — BLOOD GLUCOSE TEST VI STRP
1.0000 | ORAL_STRIP | Freq: Three times a day (TID) | 0 refills | Status: DC
Start: 2024-02-17 — End: 2024-04-26

## 2024-02-17 NOTE — Patient Instructions (Addendum)
-   he is going to try off rosuvastatin  2-3 weeks and can restart- journal if symptoms go away and if symptoms come back with starting- may be able to look at alternate medicines   diabetes control has worsened. He wants to start with a glucometer- ideally we woulc get morning sugars between 80-120. I want him to update me in 2-3  weeks- we also considered metformin if levels elevated in morning- id likely start low 500 mg once or twice a day extended release  Recommended follow up: Return in about 4 months (around 06/19/2024) for followup or sooner if needed.Schedule b4 you leave.

## 2024-02-17 NOTE — Progress Notes (Signed)
 Phone [phone] In person visit   Subjective:   Alexander Armstrong is a 83 y.o. year old very pleasant male patient who presents for/with See problem oriented charting Chief Complaint  Patient presents with   Diabetes    24mo f/u     Past Medical History-  Patient Active Problem List   Diagnosis Date Noted   Partial small bowel obstruction (HCC) 09/21/2022    Priority: High   Multiple myeloma (HCC) 08/13/2022    Priority: High   Wide-complex tachycardia - WCT (HCC) 12/09/2016    Priority: High   Cardiac syncope 03/10/2016    Priority: High   Cardiac device in situ 11/18/2015    Priority: High   Type 2 diabetes mellitus without complication, without long-term current use of insulin (HCC) 11/18/2015    Priority: High   Paroxysmal VT (HCC) 09/17/2015    Priority: High   History of acute anterior wall MI 01/26/2008    Priority: High   Coronary artery disease involving native coronary artery of native heart without angina pectoris 11/03/2004    Priority: High   Vitamin D  deficiency 12/23/2022    Priority: Medium    Hypotension 02/22/2018    Priority: Medium    BPH associated with nocturia 11/18/2015    Priority: Medium    Gastroesophageal reflux disease without esophagitis 11/18/2015    Priority: Medium    Hyperlipidemia associated with type 2 diabetes mellitus (HCC) 01/26/2008    Priority: Medium    ESOPHAGEAL STRICTURE 12/26/2007    Priority: Medium    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    Priority: Medium    Exercise intolerance 02/20/2019    Priority: Low   Keratoconjunctivitis sicca of both eyes not specified as Sjogren's 10/08/2017    Priority: Low   Urge incontinence 12/09/2016    Priority: Low   Palpitations 11/18/2015    Priority: Low   PVD (posterior vitreous detachment), both eyes 11/18/2015    Priority: Low   Umbilical hernia 11/18/2015    Priority: Low   Personal history of other malignant neoplasm of skin 07/20/2011    Priority: Low   Diaphragmatic  hernia 12/26/2007    Priority: Low   Diverticulosis of colon 12/26/2007    Priority: Low   Family history of glaucoma 10/08/2017    Priority: 1.   Other acquired hammer toe 05/27/2017    Priority: 1.   Acquired deformity of toe 05/27/2017    Priority: 1.   Acute right-sided low back pain without sciatica 11/18/2015    Priority: 1.   Ingrown nail 11/18/2015    Priority: 1.   Antineoplastic chemotherapy induced anemia 12/16/2023   Chemotherapy-induced thrombocytopenia 12/16/2023   Chest pain of uncertain etiology 07/20/2023   MRSA bacteremia 11/05/2020   Pneumonia of both lower lobes due to methicillin resistant Staphylococcus aureus (MRSA) (HCC) 11/05/2020   Chronic respiratory insufficiency 10/16/2020   Generalized weakness 05/17/2020   Hyponatremia with decreased serum osmolality 05/17/2020   Thrombocytopenia (HCC) 05/17/2020   Reflux gastritis 12/26/2007    Medications- reviewed and updated Current Outpatient Medications  Medication Sig Dispense Refill   acetaminophen  (TYLENOL ) 500 MG tablet Take 500 mg by mouth 3 (three) times daily as needed for moderate pain (pain score 4-6) or headache.     Blood Glucose Monitoring Suppl DEVI 1 each by Does not apply route in the morning, at noon, and at bedtime. May substitute to any manufacturer covered by patient's insurance. 1 each 0   Cholecalciferol (VITAMIN D3) 1000 units CAPS Take 1,000  Units by mouth at bedtime.     clopidogrel  (PLAVIX ) 75 MG tablet Take 1 tablet (75 mg total) by mouth daily. TAKE 1 TABLET(75 MG) BY MOUTH DAILY 90 tablet 3   Cyanocobalamin  1000 MCG TBCR Take 1,000 mcg by mouth daily.     dexamethasone  (DECADRON ) 4 MG tablet TAKE 5 TABS (20 MG) WEEKLY THE DAY OF DARATUMUMAB . TAKE WITH BREAKFAST. 60 tablet 5   diltiazem  (TIAZAC ) 120 MG 24 hr capsule Take 1 capsule (120 mg total) by mouth daily. 90 capsule 3   diphenhydrAMINE  (BENADRYL  ALLERGY) 25 MG tablet Take 50 mg by mouth See admin instructions. Take 50 mg by  mouth one hour prior to Daratumumab  infusion on Mondays     Docusate Sodium  (DSS) 100 MG CAPS Take by mouth daily.     famciclovir  (FAMVIR ) 250 MG tablet TAKE 1 TABLET BY MOUTH EVERY DAY 90 tablet 2   fluticasone (FLONASE) 50 MCG/ACT nasal spray Place 1 spray into both nostrils daily as needed for allergies or rhinitis.     Glucose Blood (BLOOD GLUCOSE TEST STRIPS) STRP 1 each by In Vitro route in the morning, at noon, and at bedtime. May substitute to any manufacturer covered by patient's insurance. 100 strip 0   Lancet Device MISC 1 each by Does not apply route in the morning, at noon, and at bedtime. May substitute to any manufacturer covered by patient's insurance. 1 each 0   Lancets Misc. MISC 1 each by Does not apply route in the morning, at noon, and at bedtime. May substitute to any manufacturer covered by patient's insurance. 100 each 0   loratadine (CLARITIN) 10 MG tablet Take 10 mg by mouth in the morning.     MAGNESIUM PO Take by mouth daily.     meclizine  (ANTIVERT ) 25 MG tablet Take 1 tablet (25 mg total) by mouth 3 (three) times daily as needed for dizziness. 30 tablet 5   montelukast  (SINGULAIR ) 10 MG tablet Take 10 mg by mouth daily.     pantoprazole  (PROTONIX ) 40 MG tablet TAKE 1 TABLET BY MOUTH EVERY DAY 90 tablet 3   polyethylene glycol (MIRALAX  / GLYCOLAX ) 17 g packet Take 17 g by mouth daily as needed.     REFRESH PLUS 0.5 % SOLN Place 1 drop into both eyes 3 (three) times daily as needed (for dryness).     rosuvastatin  (CRESTOR ) 20 MG tablet TAKE 1 TABLET BY MOUTH EVERY DAY 90 tablet 3   tamsulosin  (FLOMAX ) 0.4 MG CAPS capsule Take 1 capsule (0.4 mg total) by mouth at bedtime. 90 capsule 3   No current facility-administered medications for this visit.     Objective:  BP 100/60   Pulse 71   Temp 98.6 F (37 C)   Ht 5\' 10"  (1.778 m)   Wt 199 lb (90.3 kg)   SpO2 95%   BMI 28.55 kg/m  Gen: NAD, resting comfortably CV: RRR no murmurs rubs or gallops Lungs: CTAB no  crackles, wheeze, rhonchi other than faint crackles at right lung base Ext: trace edema right > left- same as baseline for that side to be larger Skin: warm, dry     Assessment and Plan   # Multiple myeloma-follows with Dr. Maria Shiner S: Patient with ongoing infusions through oncology as of January 2024.  Also on Revlimid  at home for 21 days into 204  -has felt run down in last 2-3 weeks and had elevated white blood cell(s)- but CXR and urine culture were reassuring. Has had work  stress. PET scan reassuring A/P: multiple myeloma has done well lately- brief workup for leukocytosis reassuring lately and will have follow up labs with oncology soon- labs just a week ago so we opted to hold off today - not sure what is causing fatigue other than stress potentially - no chest pain . Perhaps mild shortness of breath at times  #Right hip pain, some in the left- as well as some right lateral low back pain- thankfully PET reassuring. Feeling achy all over. Some friends ill.  - he is going to try off rosuvastatin  2-3 weeks and can restart- journal if symptoms go away and if symptoms come back with starting- may be able to look at alternate medicines  #CAD-follows with Dr. Addie Holstein with history of MI and PCI #hyperlipidemia-LDL goal under 70 #Palpitations-as needed diltiazem  60 mg. also 120 mg extended release  S: Medication:Plavix  75 mg, rosuvastatin  20 mg A/P: some shortness of breath with exertion but no chest pain- largely doing well- continue current medications other than short term hold rosuvastatin . Remain on Plavix .  Palpitations no issues and not needing short acting- only long activing right now   # Diabetes S: Medication: diet controlled in the past.  CBGs- most recent sugar level up to 234 despite watching sweets and carbohydrates- at least a month ago -work has been very stressful. Decadroncould have contributed -not checking sugar -declines diabetes education  Lab Results  Component  Value Date   HGBA1C 7.7 (A) 02/17/2024   HGBA1C 7.2 (A) 10/14/2023   HGBA1C 6.3 (A) 04/01/2023   A/P: diabetes control has worsened. He wants to start with a glucometer- ideally we woulc get morning sugars between 80-120. I want him to update me in 2 weeks- we also considered metformin if levels elevated in morning- id likely start low 500 mg once or twice a day extended release  # GERD with history of Barrett's esophagus S:Medication:  pantoprazole  40 mg A/P: no issues lately- continue to monitor    Recommended follow up: Return in about 4 months (around 06/19/2024) for followup or sooner if needed.Schedule b4 you leave. Future Appointments  Date Time Provider Department Center  02/24/2024  9:30 AM [facility] LAB [facility] None  02/24/2024  9:45 AM [facility] INFUSION [facility] None  03/09/2024  9:15 AM [facility] None  03/09/2024  9:30 AM Ennever, Sherryll Donald, MD [facility] None  03/09/2024  9:45 AM [facility] INFUSION [facility] None  03/23/2024  9:00 AM [facility] LAB [facility] None  03/23/2024  9:15 AM [facility] INFUSION [facility] None  04/06/2024  9:30 AM [facility] LAB [facility] None  04/06/2024  9:45 AM Ennever, Sherryll Donald, MD [facility] None  04/06/2024 10:00 AM [facility] INFUSION [facility] None    Lab/Order associations:   ICD-10-CM   1. Type 2 diabetes mellitus without complication, without long-term current use of insulin (HCC)  E11.9 POCT HgB A1C    2. Coronary artery disease involving native coronary artery of native heart without angina pectoris  I25.10     3. Multiple myeloma, remission status unspecified (HCC)  C90.00       Meds ordered this encounter  Medications   Blood Glucose Monitoring Suppl DEVI    Sig: 1 each by Does not apply route in the morning, at noon, and at bedtime. May substitute to any manufacturer covered by patient's insurance.    Dispense:  1 each    Refill:  0   Glucose Blood (BLOOD GLUCOSE TEST STRIPS) STRP    Sig: 1 each by In Vitro  route in the morning, at noon, and at bedtime. May  substitute to any manufacturer covered by patient's insurance.    Dispense:  100 strip    Refill:  0   Lancet Device MISC    Sig: 1 each by Does not apply route in the morning, at noon, and at bedtime. May substitute to any manufacturer covered by patient's insurance.    Dispense:  1 each    Refill:  0   Lancets Misc. MISC    Sig: 1 each by Does not apply route in the morning, at noon, and at bedtime. May substitute to any manufacturer covered by patient's insurance.    Dispense:  100 each    Refill:  0    Return precautions advised.  Clarisa Crooked, MD

## 2024-02-18 ENCOUNTER — Other Ambulatory Visit: Payer: Self-pay

## 2024-02-21 ENCOUNTER — Encounter: Payer: Self-pay | Admitting: Family Medicine

## 2024-02-21 ENCOUNTER — Other Ambulatory Visit: Payer: Self-pay

## 2024-02-21 MED ORDER — BLOOD GLUCOSE MONITORING SUPPL DEVI: 1.0000 | Freq: Three times a day (TID) | 0 refills | Status: AC

## 2024-02-24 ENCOUNTER — Inpatient Hospital Stay

## 2024-02-24 VITALS — BP 119/75 | HR 81 | Temp 97.9°F | Resp 20

## 2024-02-24 DIAGNOSIS — Z5112 Encounter for antineoplastic immunotherapy: Secondary | ICD-10-CM | POA: Diagnosis not present

## 2024-02-24 DIAGNOSIS — C9000 Multiple myeloma not having achieved remission: Secondary | ICD-10-CM

## 2024-02-24 LAB — CMP (CANCER CENTER ONLY)
ALT: 11 U/L (ref 0–44)
AST: 14 U/L — ABNORMAL LOW (ref 15–41)
Albumin: 4.4 g/dL (ref 3.5–5.0)
Alkaline Phosphatase: 73 U/L (ref 38–126)
Anion gap: 8 (ref 5–15)
BUN: 20 mg/dL (ref 8–23)
CO2: 27 mmol/L (ref 22–32)
Calcium: 9.7 mg/dL (ref 8.9–10.3)
Chloride: 105 mmol/L (ref 98–111)
Creatinine: 1.25 mg/dL — ABNORMAL HIGH (ref 0.61–1.24)
GFR, Estimated: 57 mL/min — ABNORMAL LOW (ref >60)
Glucose, Bld: 126 mg/dL — ABNORMAL HIGH (ref 70–99)
Potassium: 4.8 mmol/L (ref 3.5–5.1)
Sodium: 140 mmol/L (ref 135–145)
Total Bilirubin: 0.5 mg/dL (ref 0.0–1.2)
Total Protein: 6 g/dL — ABNORMAL LOW (ref 6.5–8.1)

## 2024-02-24 LAB — CBC WITH DIFFERENTIAL (CANCER CENTER ONLY)
Abs Immature Granulocytes: 0.08 10*3/uL — ABNORMAL HIGH (ref 0.00–0.07)
Basophils Absolute: 0.1 10*3/uL (ref 0.0–0.1)
Basophils Relative: 1 %
Eosinophils Absolute: 0.5 10*3/uL (ref 0.0–0.5)
Eosinophils Relative: 5 %
HCT: 35.1 % — ABNORMAL LOW (ref 39.0–52.0)
Hemoglobin: 11.9 g/dL — ABNORMAL LOW (ref 13.0–17.0)
Immature Granulocytes: 1 %
Lymphocytes Relative: 19 %
Lymphs Abs: 1.8 10*3/uL (ref 0.7–4.0)
MCH: 32.3 pg (ref 26.0–34.0)
MCHC: 33.9 g/dL (ref 30.0–36.0)
MCV: 95.4 fL (ref 80.0–100.0)
Monocytes Absolute: 0.9 10*3/uL (ref 0.1–1.0)
Monocytes Relative: 10 %
Neutro Abs: 6.2 10*3/uL (ref 1.7–7.7)
Neutrophils Relative %: 64 %
Platelet Count: 163 10*3/uL (ref 150–400)
RBC: 3.68 MIL/uL — ABNORMAL LOW (ref 4.22–5.81)
RDW: 12.5 % (ref 11.5–15.5)
WBC Count: 9.5 10*3/uL (ref 4.0–10.5)
nRBC: 0 % (ref 0.0–0.2)

## 2024-02-24 MED ORDER — BORTEZOMIB CHEMO SQ INJECTION 3.5 MG (2.5MG/ML)
1.3000 mg/m2 | Freq: Once | INTRAMUSCULAR | Status: AC
  Administered 2024-02-24: 2.75 mg via SUBCUTANEOUS
  Filled 2024-02-24: qty 1.1

## 2024-02-24 MED ORDER — PROCHLORPERAZINE MALEATE 10 MG PO TABS
10.0000 mg | ORAL_TABLET | Freq: Once | ORAL | Status: DC
Start: 2024-02-24 — End: 2024-02-24

## 2024-02-24 NOTE — Patient Instructions (Signed)
 Bortezomib Injection What is this medication? BORTEZOMIB (bor TEZ oh mib) treats lymphoma. It may also be used to treat multiple myeloma, a type of bone marrow cancer. It works by blocking a protein that causes cancer cells to grow and multiply. This helps to slow or stop the spread of cancer cells. This medicine may be used for other purposes; ask your health care provider or pharmacist if you have questions. COMMON BRAND NAME(S): BORUZU, Velcade What should I tell my care team before I take this medication? They need to know if you have any of these conditions: Dehydration Diabetes Heart disease Liver disease Tingling of the fingers or toes or other nerve disorder An unusual or allergic reaction to bortezomib, other medications, foods, dyes, or preservatives If you or your partner are pregnant or trying to get pregnant Breastfeeding How should I use this medication? This medication is injected into a vein or under the skin. It is given by your care team in a hospital or clinic setting. Talk to your care team about the use of this medication in children. Special care may be needed. Overdosage: If you think you have taken too much of this medicine contact a poison control center or emergency room at once. NOTE: This medicine is only for you. Do not share this medicine with others. What if I miss a dose? Keep appointments for follow-up doses. It is important not to miss your dose. Call your care team if you are unable to keep an appointment. What may interact with this medication? Ketoconazole Rifampin This list may not describe all possible interactions. Give your health care provider a list of all the medicines, herbs, non-prescription drugs, or dietary supplements you use. Also tell them if you smoke, drink alcohol, or use illegal drugs. Some items may interact with your medicine. What should I watch for while using this medication? Your condition will be monitored carefully while you  are receiving this medication. You may need blood work while taking this medication. This medication may affect your coordination, reaction time, or judgment. Do not drive or operate machinery until you know how this medication affects you. Sit up or stand slowly to reduce the risk of dizzy or fainting spells. Drinking alcohol with this medication can increase the risk of these side effects. This medication may increase your risk of getting an infection. Call your care team for advice if you get a fever, chills, sore throat, or other symptoms of a cold or flu. Do not treat yourself. Try to avoid being around people who are sick. Check with your care team if you have severe diarrhea, nausea, and vomiting, or if you sweat a lot. The loss of too much body fluid may make it dangerous for you to take this medication. Talk to your care team if you may be pregnant. Serious birth defects can occur if you take this medication during pregnancy and for 7 months after the last dose. You will need a negative pregnancy test before starting this medication. Contraception is recommended while taking this medication and for 7 months after the last dose. Your care team can help you find the option that works for you. If your partner can get pregnant, use a condom during sex while taking this medication and for 4 months after the last dose. Do not breastfeed while taking this medication and for 2 months after the last dose. This medication may cause infertility. Talk to your care team if you are concerned about your fertility. What side  effects may I notice from receiving this medication? Side effects that you should report to your care team as soon as possible: Allergic reactions--skin rash, itching, hives, swelling of the face, lips, tongue, or throat Bleeding--bloody or black, tar-like stools, vomiting blood or brown material that looks like coffee grounds, red or dark brown urine, small red or purple spots on skin,  unusual bruising or bleeding Bleeding in the brain--severe headache, stiff neck, confusion, dizziness, change in vision, numbness or weakness of the face, arm, or leg, trouble speaking, trouble walking, vomiting Bowel blockage--stomach cramping, unable to have a bowel movement or pass gas, loss of appetite, vomiting Heart failure--shortness of breath, swelling of the ankles, feet, or hands, sudden weight gain, unusual weakness or fatigue Infection--fever, chills, cough, sore throat, wounds that don't heal, pain or trouble when passing urine, general feeling of discomfort or being unwell Liver injury--right upper belly pain, loss of appetite, nausea, light-colored stool, dark yellow or brown urine, yellowing skin or eyes, unusual weakness or fatigue Low blood pressure--dizziness, feeling faint or lightheaded, blurry vision Lung injury--shortness of breath or trouble breathing, cough, spitting up blood, chest pain, fever Pain, tingling, or numbness in the hands or feet Severe or prolonged diarrhea Stomach pain, bloody diarrhea, pale skin, unusual weakness or fatigue, decrease in the amount of urine, which may be signs of hemolytic uremic syndrome Sudden and severe headache, confusion, change in vision, seizures, which may be signs of posterior reversible encephalopathy syndrome (PRES) TTP--purple spots on the skin or inside the mouth, pale skin, yellowing skin or eyes, unusual weakness or fatigue, fever, fast or irregular heartbeat, confusion, change in vision, trouble speaking, trouble walking Tumor lysis syndrome (TLS)--nausea, vomiting, diarrhea, decrease in the amount of urine, dark urine, unusual weakness or fatigue, confusion, muscle pain or cramps, fast or irregular heartbeat, joint pain Side effects that usually do not require medical attention (report to your care team if they continue or are bothersome): Constipation Diarrhea Fatigue Loss of appetite Nausea This list may not describe all  possible side effects. Call your doctor for medical advice about side effects. You may report side effects to FDA at 1-800-FDA-1088. Where should I keep my medication? This medication is given in a hospital or clinic. It will not be stored at home. NOTE: This sheet is a summary. It may not cover all possible information. If you have questions about this medicine, talk to your doctor, pharmacist, or health care provider.  2024 Elsevier/Gold Standard (2022-02-24 00:00:00)

## 2024-03-05 HISTORY — PX: NM MYOVIEW LTD: HXRAD82

## 2024-03-09 ENCOUNTER — Encounter: Payer: Self-pay | Admitting: Hematology & Oncology

## 2024-03-09 ENCOUNTER — Encounter: Payer: Self-pay | Admitting: Medical Oncology

## 2024-03-09 ENCOUNTER — Ambulatory Visit: Payer: Self-pay | Admitting: Medical Oncology

## 2024-03-09 ENCOUNTER — Inpatient Hospital Stay

## 2024-03-09 ENCOUNTER — Other Ambulatory Visit: Payer: Self-pay | Admitting: Hematology & Oncology

## 2024-03-09 ENCOUNTER — Inpatient Hospital Stay: Attending: Hematology & Oncology

## 2024-03-09 ENCOUNTER — Inpatient Hospital Stay (HOSPITAL_BASED_OUTPATIENT_CLINIC_OR_DEPARTMENT_OTHER): Admitting: Medical Oncology

## 2024-03-09 VITALS — BP 87/67 | HR 80 | Temp 97.8°F | Resp 18 | Ht 70.0 in | Wt 197.0 lb

## 2024-03-09 DIAGNOSIS — T451X5A Adverse effect of antineoplastic and immunosuppressive drugs, initial encounter: Secondary | ICD-10-CM | POA: Diagnosis not present

## 2024-03-09 DIAGNOSIS — D6481 Anemia due to antineoplastic chemotherapy: Secondary | ICD-10-CM

## 2024-03-09 DIAGNOSIS — D6959 Other secondary thrombocytopenia: Secondary | ICD-10-CM | POA: Diagnosis not present

## 2024-03-09 DIAGNOSIS — C9000 Multiple myeloma not having achieved remission: Secondary | ICD-10-CM

## 2024-03-09 DIAGNOSIS — Z5112 Encounter for antineoplastic immunotherapy: Secondary | ICD-10-CM | POA: Insufficient documentation

## 2024-03-09 DIAGNOSIS — D630 Anemia in neoplastic disease: Secondary | ICD-10-CM | POA: Insufficient documentation

## 2024-03-09 LAB — CBC WITH DIFFERENTIAL (CANCER CENTER ONLY)
Abs Immature Granulocytes: 0.03 10*3/uL (ref 0.00–0.07)
Basophils Absolute: 0.1 10*3/uL (ref 0.0–0.1)
Basophils Relative: 1 %
Eosinophils Absolute: 0.5 10*3/uL (ref 0.0–0.5)
Eosinophils Relative: 6 %
HCT: 36.8 % — ABNORMAL LOW (ref 39.0–52.0)
Hemoglobin: 12.5 g/dL — ABNORMAL LOW (ref 13.0–17.0)
Immature Granulocytes: 0 %
Lymphocytes Relative: 22 %
Lymphs Abs: 1.7 10*3/uL (ref 0.7–4.0)
MCH: 32.5 pg (ref 26.0–34.0)
MCHC: 34 g/dL (ref 30.0–36.0)
MCV: 95.6 fL (ref 80.0–100.0)
Monocytes Absolute: 0.7 10*3/uL (ref 0.1–1.0)
Monocytes Relative: 9 %
Neutro Abs: 4.7 10*3/uL (ref 1.7–7.7)
Neutrophils Relative %: 62 %
Platelet Count: 147 10*3/uL — ABNORMAL LOW (ref 150–400)
RBC: 3.85 MIL/uL — ABNORMAL LOW (ref 4.22–5.81)
RDW: 12.7 % (ref 11.5–15.5)
WBC Count: 7.6 10*3/uL (ref 4.0–10.5)
nRBC: 0 % (ref 0.0–0.2)

## 2024-03-09 LAB — CMP (CANCER CENTER ONLY)
ALT: 11 U/L (ref 0–44)
AST: 15 U/L (ref 15–41)
Albumin: 4.2 g/dL (ref 3.5–5.0)
Alkaline Phosphatase: 73 U/L (ref 38–126)
Anion gap: 8 (ref 5–15)
BUN: 24 mg/dL — ABNORMAL HIGH (ref 8–23)
CO2: 26 mmol/L (ref 22–32)
Calcium: 9.4 mg/dL (ref 8.9–10.3)
Chloride: 105 mmol/L (ref 98–111)
Creatinine: 1.22 mg/dL (ref 0.61–1.24)
GFR, Estimated: 59 mL/min — ABNORMAL LOW (ref >60)
Glucose, Bld: 162 mg/dL — ABNORMAL HIGH (ref 70–99)
Potassium: 4.2 mmol/L (ref 3.5–5.1)
Sodium: 139 mmol/L (ref 135–145)
Total Bilirubin: 0.4 mg/dL (ref 0.0–1.2)
Total Protein: 6.3 g/dL — ABNORMAL LOW (ref 6.5–8.1)

## 2024-03-09 LAB — LACTATE DEHYDROGENASE: LDH: 138 U/L (ref 98–192)

## 2024-03-09 MED ORDER — ACETAMINOPHEN 325 MG PO TABS
650.0000 mg | ORAL_TABLET | Freq: Once | ORAL | Status: DC
Start: 2024-03-09 — End: 2024-03-09

## 2024-03-09 MED ORDER — BORTEZOMIB CHEMO SQ INJECTION 3.5 MG (2.5MG/ML)
1.3000 mg/m2 | Freq: Once | INTRAMUSCULAR | Status: AC
  Administered 2024-03-09: 2.75 mg via SUBCUTANEOUS
  Filled 2024-03-09: qty 1.1

## 2024-03-09 MED ORDER — DARATUMUMAB-HYALURONIDASE-FIHJ 1800-30000 MG-UT/15ML ~~LOC~~ SOLN
1800.0000 mg | Freq: Once | SUBCUTANEOUS | Status: AC
  Administered 2024-03-09: 1800 mg via SUBCUTANEOUS
  Filled 2024-03-09: qty 15

## 2024-03-09 MED ORDER — DIPHENHYDRAMINE HCL 25 MG PO CAPS
50.0000 mg | ORAL_CAPSULE | Freq: Once | ORAL | Status: DC
Start: 2024-03-09 — End: 2024-03-09

## 2024-03-09 NOTE — Progress Notes (Signed)
 Ok to treat with BP 87/67.  Alexander Armstrong [place], [place], BCPS, BCOP 03/09/2024 10:48 AM

## 2024-03-09 NOTE — Patient Instructions (Signed)
 [facility] - A DEPT OF [facility]  Discharge Instructions: Thank you for choosing [facility] to provide your oncology and hematology care.   If you have a lab appointment with the Cancer Center, please go directly to the Cancer Center and check in at the registration area.  Wear comfortable clothing and clothing appropriate for easy access to any Portacath or PICC line.   We strive to give you quality time with your provider. You may need to reschedule your appointment if you arrive late (15 or more minutes).  Arriving late affects you and other patients whose appointments are after yours.  Also, if you miss three or more appointments without notifying the office, you may be dismissed from the clinic at the provider's discretion.      For prescription refill requests, have your pharmacy contact our office and allow 72 hours for refills to be completed.    Today you received the following chemotherapy and/or immunotherapy agents velcade , faspro,      To help prevent nausea and vomiting after your treatment, we encourage you to take your nausea medication as directed.  BELOW ARE SYMPTOMS THAT SHOULD BE REPORTED IMMEDIATELY: *FEVER GREATER THAN 100.4 F (38 C) OR HIGHER *CHILLS OR SWEATING *NAUSEA AND VOMITING THAT IS NOT CONTROLLED WITH YOUR NAUSEA MEDICATION *UNUSUAL SHORTNESS OF BREATH *UNUSUAL BRUISING OR BLEEDING *URINARY PROBLEMS (pain or burning when urinating, or frequent urination) *BOWEL PROBLEMS (unusual diarrhea, constipation, pain near the anus) TENDERNESS IN MOUTH AND THROAT WITH OR WITHOUT PRESENCE OF ULCERS (sore throat, sores in mouth, or a toothache) UNUSUAL RASH, SWELLING OR PAIN  UNUSUAL VAGINAL DISCHARGE OR ITCHING   Items with * indicate a potential emergency and should be followed up as soon as possible or go to the Emergency Department if any problems should occur.  Please show the CHEMOTHERAPY ALERT CARD or  IMMUNOTHERAPY ALERT CARD at check-in to the Emergency Department and triage nurse. Should you have questions after your visit or need to cancel or reschedule your appointment, please contact [facility] - A DEPT OF [facility]  [phone] and follow the prompts.  Office hours are 8:00 a.m. to 4:30 p.m. Monday - Friday. Please note that voicemails left after 4:00 p.m. may not be returned until the following business day.  We are closed weekends and major holidays. You have access to a nurse at all times for urgent questions. Please call the main number to the clinic [phone] and follow the prompts.  For any non-urgent questions, you may also contact your provider using MyChart. We now offer e-Visits for anyone 87 and older to request care online for non-urgent symptoms. For details visit [email or web address].   Also download the MyChart app! Go to the app store, search "MyChart", open the app, select [place], and log in with your MyChart username and password.

## 2024-03-09 NOTE — Progress Notes (Signed)
 Hematology and Oncology Follow Up Visit  Alexander Armstrong [id number] 09-17-1941 83 y.o. 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 --Start on 10/29/2022     Interim History:  [name] is back for follow-up and consideration of treatment.   Today he states that he is doing well. He has no concerns today.   Has chronic hypotension. He is asymptomatic.   He is checking glucose at home- 141 is his average fasting morning glucose value.   His last monoclonal spike was negative on 02/10/2024. His IgG level was 500 mg/dL.  The Kappa light chain was 0.8 mg/dL.  Overall, he has had no problems with cough or shortness of breath.  He has had no change in bowel or bladder habits.  Scant chronic swelling in his legs. He follows a high salt diet to help with his chronic hypotension.   He has had no bleeding.  There has been no fever.   Weight is down slightly. He is trying to make some changes to his diet to help with this. He has quit desserts to help with his glucose values.   Overall, I would say his performance status is probably ECOG 1.   Wt Readings from Last 3 Encounters:  03/09/24 197 lb (89.4 kg)  02/17/24 199 lb (90.3 kg)  02/10/24 201 lb 1.3 oz (91.2 kg)     Medications:  Current Outpatient Medications:    acetaminophen  (TYLENOL ) 500 MG tablet, Take 500 mg by mouth 3 (three) times daily as needed for moderate pain (pain score 4-6) or headache., Disp: , Rfl:    Blood Glucose Monitoring Suppl DEVI, 1 each by Does not apply route in the morning, at noon, and at bedtime. May substitute to any manufacturer covered by patient's insurance., Disp: 1 each, Rfl: 0    Cholecalciferol (VITAMIN D3) 1000 units CAPS, Take 1,000 Units by mouth at bedtime., Disp: , Rfl:    clopidogrel  (PLAVIX ) 75 MG tablet, Take 1 tablet (75 mg total) by mouth daily. TAKE 1 TABLET(75 MG) BY MOUTH DAILY, Disp: 90 tablet, Rfl: 3   Cyanocobalamin  1000 MCG TBCR, Take 1,000 mcg by mouth daily., Disp: , Rfl:    dexamethasone  (DECADRON ) 4 MG tablet, TAKE 5 TABS (20 MG) WEEKLY THE DAY OF DARATUMUMAB . TAKE WITH BREAKFAST., Disp: 60 tablet, Rfl: 5   diltiazem  (TIAZAC ) 120 MG 24 hr capsule, Take 1 capsule (120 mg total) by mouth daily., Disp: 90 capsule, Rfl: 3   diphenhydrAMINE  (BENADRYL  ALLERGY) 25 MG tablet, Take 50 mg by mouth See admin instructions. Take 50 mg by mouth one hour prior to Daratumumab  infusion on Mondays, Disp: , Rfl:    Docusate Sodium  (DSS) 100 MG CAPS, Take by mouth daily., Disp: , Rfl:    famciclovir  (FAMVIR ) 250 MG tablet, TAKE 1 TABLET BY MOUTH EVERY DAY, Disp: 90 tablet, Rfl: 2   fluticasone (FLONASE) 50 MCG/ACT nasal spray, Place 1 spray into both nostrils daily as needed for allergies or rhinitis., Disp: , Rfl:    Glucose Blood (BLOOD GLUCOSE TEST STRIPS) STRP, 1 each by In Vitro route in the morning, at noon, and at bedtime. May substitute to any manufacturer covered by patient's insurance., Disp: 100 strip, Rfl: 0   Lancet Device MISC, 1  each by Does not apply route in the morning, at noon, and at bedtime. May substitute to any manufacturer covered by patient's insurance., Disp: 1 each, Rfl: 0   Lancets Misc. MISC, 1 each by Does not apply route in the morning, at noon, and at bedtime. May substitute to any manufacturer covered by patient's insurance., Disp: 100 each, Rfl: 0   loratadine (CLARITIN) 10 MG tablet, Take 10 mg by mouth in the morning., Disp: , Rfl:    MAGNESIUM PO, Take by mouth daily., Disp: , Rfl:    meclizine  (ANTIVERT ) 25 MG tablet, Take 1 tablet (25 mg total) by mouth 3 (three) times daily as needed for dizziness., Disp: 30 tablet, Rfl: 5    montelukast  (SINGULAIR ) 10 MG tablet, Take 10 mg by mouth daily., Disp: , Rfl:    pantoprazole  (PROTONIX ) 40 MG tablet, TAKE 1 TABLET BY MOUTH EVERY DAY, Disp: 90 tablet, Rfl: 3   polyethylene glycol (MIRALAX  / GLYCOLAX ) 17 g packet, Take 17 g by mouth daily as needed., Disp: , Rfl:    REFRESH PLUS 0.5 % SOLN, Place 1 drop into both eyes 3 (three) times daily as needed (for dryness)., Disp: , Rfl:    rosuvastatin  (CRESTOR ) 20 MG tablet, TAKE 1 TABLET BY MOUTH EVERY DAY, Disp: 90 tablet, Rfl: 3   tamsulosin  (FLOMAX ) 0.4 MG CAPS capsule, Take 1 capsule (0.4 mg total) by mouth at bedtime., Disp: 90 capsule, Rfl: 3  Allergies:  Allergies  Allergen Reactions   Latex Other (See Comments)    "takes the skin off" (tape)    Past Medical History, Surgical history, Social history, and Family History were reviewed and updated.  Review of Systems: Review of Systems  Constitutional: Negative.   HENT:  Negative.    Eyes: Negative.   Respiratory: Negative.    Cardiovascular: Negative.   Gastrointestinal: Negative.   Endocrine: Negative.   Genitourinary: Negative.    Musculoskeletal: Negative.   Skin: Negative.   Neurological: Negative.   Hematological: Negative.   Psychiatric/Behavioral: Negative.      Physical Exam:  Vitals:   03/09/24 0938  BP: (!) 87/67  Pulse: 80  Resp: 18  Temp: 97.8 F (36.6 C)  SpO2: 100%     Wt Readings from Last 3 Encounters:  03/09/24 197 lb (89.4 kg)  02/17/24 199 lb (90.3 kg)  02/10/24 201 lb 1.3 oz (91.2 kg)    Physical Exam Vitals reviewed.  HENT:     Head: Normocephalic and atraumatic.  Eyes:     Pupils: Pupils are equal, round, and reactive to light.  Cardiovascular:     Rate and Rhythm: Normal rate and regular rhythm.     Heart sounds: Normal heart sounds.  Pulmonary:     Effort: Pulmonary effort is normal.     Breath sounds: Normal breath sounds.  Abdominal:     General: Bowel sounds are normal.     Palpations: Abdomen is soft.   Musculoskeletal:        General: No tenderness or deformity. Normal range of motion.     Cervical back: Normal range of motion.  Lymphadenopathy:     Cervical: No cervical adenopathy.  Skin:    General: Skin is warm and dry.     Findings: No erythema or rash.  Neurological:     Mental Status: He is alert and oriented to person, place, and time.  Psychiatric:        Behavior: Behavior normal.        Thought  Content: Thought content normal.        Judgment: Judgment normal.     Lab Results  Component Value Date   WBC 7.6 03/09/2024   HGB 12.5 (L) 03/09/2024   HCT 36.8 (L) 03/09/2024   MCV 95.6 03/09/2024   PLT 147 (L) 03/09/2024     Chemistry      Component Value Date/Time   NA 140 02/24/2024 0926   K 4.8 02/24/2024 0926   CL 105 02/24/2024 0926   CO2 27 02/24/2024 0926   BUN 20 02/24/2024 0926   CREATININE 1.25 (H) 02/24/2024 0926   CREATININE 1.33 (H) 01/21/2017 1616      Component Value Date/Time   CALCIUM  9.7 02/24/2024 0926   ALKPHOS 73 02/24/2024 0926   AST 14 (L) 02/24/2024 0926   ALT 11 02/24/2024 0926   BILITOT 0.5 02/24/2024 0926     Encounter Diagnoses  Name Primary?   Multiple myeloma not having achieved remission (HCC) Yes   Chemotherapy-induced thrombocytopenia    Antineoplastic chemotherapy induced anemia    Impression and Plan: Mr. Gosnell is a very nice 83 year old white male.  He has IgG kappa myeloma.  His chromosomal abnormalities resolved with treatment. Currently he is being treated with Darzalex  Faspro/ Velcade .   01/30/2024 PET scan did not show evidence of bony abnormality.   Monoclonal labs continue to be stable.   CBC reviewed today- WBC improved. Hgb stable. Platelets are stable at 147  Recheck BP Treatment today-pending CMP RTC 1 month MD, port labs(CBC w/, CMP, LDH, MM panel, light chains), Darzalex  Faspro/ Velcade   Sharla Davis, PA-C 6/5/20259:52 AM

## 2024-03-10 LAB — KAPPA/LAMBDA LIGHT CHAINS
Kappa free light chain: 8.2 mg/L (ref 3.3–19.4)
Kappa, lambda light chain ratio: 1.95 — ABNORMAL HIGH (ref 0.26–1.65)
Lambda free light chains: 4.2 mg/L — ABNORMAL LOW (ref 5.7–26.3)

## 2024-03-13 LAB — MULTIPLE MYELOMA PANEL, SERUM
Albumin SerPl Elph-Mcnc: 3.4 g/dL (ref 2.9–4.4)
Albumin/Glob SerPl: 1.5 (ref 0.7–1.7)
Alpha 1: 0.2 g/dL (ref 0.0–0.4)
Alpha2 Glob SerPl Elph-Mcnc: 0.8 g/dL (ref 0.4–1.0)
B-Globulin SerPl Elph-Mcnc: 0.9 g/dL (ref 0.7–1.3)
Gamma Glob SerPl Elph-Mcnc: 0.4 g/dL (ref 0.4–1.8)
Globulin, Total: 2.3 g/dL (ref 2.2–3.9)
IgA: 19 mg/dL — ABNORMAL LOW (ref 61–437)
IgG (Immunoglobin G), Serum: 507 mg/dL — ABNORMAL LOW (ref 603–1613)
IgM (Immunoglobulin M), Srm: 6 mg/dL — ABNORMAL LOW (ref 15–143)
Total Protein ELP: 5.7 g/dL — ABNORMAL LOW (ref 6.0–8.5)

## 2024-03-15 ENCOUNTER — Ambulatory Visit: Attending: Cardiology | Admitting: Cardiology

## 2024-03-15 ENCOUNTER — Other Ambulatory Visit: Payer: Self-pay | Admitting: Family Medicine

## 2024-03-15 ENCOUNTER — Encounter: Payer: Self-pay | Admitting: Cardiology

## 2024-03-15 VITALS — BP 92/40 | HR 74 | Ht 71.0 in | Wt 197.2 lb

## 2024-03-15 DIAGNOSIS — E785 Hyperlipidemia, unspecified: Secondary | ICD-10-CM | POA: Insufficient documentation

## 2024-03-15 DIAGNOSIS — R600 Localized edema: Secondary | ICD-10-CM | POA: Insufficient documentation

## 2024-03-15 DIAGNOSIS — I472 Ventricular tachycardia, unspecified: Secondary | ICD-10-CM | POA: Diagnosis present

## 2024-03-15 DIAGNOSIS — I252 Old myocardial infarction: Secondary | ICD-10-CM | POA: Diagnosis present

## 2024-03-15 DIAGNOSIS — E1169 Type 2 diabetes mellitus with other specified complication: Secondary | ICD-10-CM | POA: Insufficient documentation

## 2024-03-15 DIAGNOSIS — C9000 Multiple myeloma not having achieved remission: Secondary | ICD-10-CM | POA: Insufficient documentation

## 2024-03-15 DIAGNOSIS — I2089 Other forms of angina pectoris: Secondary | ICD-10-CM | POA: Diagnosis present

## 2024-03-15 DIAGNOSIS — R000 Tachycardia, unspecified: Secondary | ICD-10-CM | POA: Insufficient documentation

## 2024-03-15 DIAGNOSIS — I2510 Atherosclerotic heart disease of native coronary artery without angina pectoris: Secondary | ICD-10-CM | POA: Diagnosis present

## 2024-03-15 DIAGNOSIS — I9589 Other hypotension: Secondary | ICD-10-CM | POA: Diagnosis present

## 2024-03-15 DIAGNOSIS — I25118 Atherosclerotic heart disease of native coronary artery with other forms of angina pectoris: Secondary | ICD-10-CM | POA: Diagnosis not present

## 2024-03-15 MED ORDER — FUROSEMIDE 20 MG PO TABS: 20.0000 mg | ORAL_TABLET | Freq: Every day | ORAL | 11 refills | Status: AC | PRN

## 2024-03-15 NOTE — Patient Instructions (Addendum)
 Medication Instructions:   Lasix  ( furosemide) 20 mg  as needed daily  *If you need a refill on your cardiac medications before your next appointment, please call your pharmacy*   Lab Work: Not needed .   Testing/Procedures:  Your physician has requested that you have a lexiscan  myoview .  Please follow instruction below.   Follow-Up: At [facility], you and your health needs are our priority.  As part of our continuing mission to provide you with exceptional heart care, we have created designated Provider Care Teams.  These Care Teams include your primary Cardiologist (physician) and Advanced Practice Providers (APPs -  Physician Assistants and Nurse Practitioners) who all work together to provide you with the care you need, when you need it.     Your next appointment:   4 month(s)  The format for your next appointment:   In Person  Provider:   Randene Bustard, MD   Other Instructions   Your doctor has scheduled you for a Myocardial Perfusion scan  to obtain information about the blood flow to your heart. The test consists of taking pictures of your heart in two phases: while resting and after a stress test.  The stress test may involve walking on a treadmill, or if you are unable to exercise adequately, you will be given a drug intended to have a similar effect on the heart to that of exercise.  The test will take approximately 3 to 4  hours to complete.  How to prepare for your test: Do not eat or drink 2 hours prior to your test Do not consume products containing caffeine 12 hours prior to your test (examples: coffee (regular OR decaf), chocolate, sodas, tea) Your doctor may need you to hold certain medications prior to the test.  If so, these are listed below and should not be taken for 24 hours prior to the test.  If not listed below, you may take your medications as normal.  You may resume taking held medications on your normal schedule once the test is complete.   Meds to  hold: NONE Do bring a list of your current medications with you.  If you have held any meds in preparation for the test, please bring them, as you may be required to take them once the test is completed. Do wear comfortable clothes and walking shoes.  Do not wear dresses or overalls. Do NOT wear cologne, perfume, aftershave, or fragranced lotions the day of your test (deodorants okay). If these instructions are not followed your test will have to be rescheduled.   A nuclear cardiologist will review your test, prepare a report and send it to your physician.   If you have questions or concerns about your appointment, you can call the Nuclear Cardiology department at [phone]. If you cannot keep your appointment, please provide 48 hours notification to avoid a possible $50.00 charge to your account.   Please arrive 15 minutes prior to your appointment time for registration and insurance purposes

## 2024-03-16 ENCOUNTER — Encounter: Payer: Self-pay | Admitting: Cardiology

## 2024-03-16 DIAGNOSIS — R600 Localized edema: Secondary | ICD-10-CM | POA: Insufficient documentation

## 2024-03-16 NOTE — Assessment & Plan Note (Signed)
 Not associate with PND orthopnea but is associate with some exertional dyspnea.  Does not really seem related to CHF may be more guarded venous stasis or lymphedema.  Does not appear to be significantly edematous on exam.  Will order low-dose Lasix 20 Mg with use PRN to try again by Dr. Baruch Likens weight

## 2024-03-16 NOTE — Assessment & Plan Note (Addendum)
 See above.  No recurrent symptoms.  Continue standing dose of diltiazem .

## 2024-03-16 NOTE — Progress Notes (Signed)
 Cardiology Office Note:  .   Date:  03/16/2024  ID:  Alexander Armstrong, DOB 1941-04-08, MRN [id number] PCP: Almira Jaeger, MD  [facility] Providers Cardiologist:  Randene Bustard, MD Electrophysiologist:  Lei Pump, MD     Chief Complaint  Patient presents with   Follow-up    Earlier than 1 year but follow-up because of exertional dyspnea and fatigue; noting issues with blood sugars going up-mid related to Decadron  that he takes for chemotherapy Notes chest pain after some exertion blood sugars going up and down.  Breathlessness after slight exertion and notable swelling.   Coronary Artery Disease    Never had angina, most notable symptom was dyspnea    Patient Profile: .     Alexander Armstrong is a  83 y.o. male  with a PMH noted below who presents here for earlier than usual follow-up to assess exertional dyspnea and chest pain at the request of Almira Jaeger, MD.  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 Two-vessel CAD-PCI to LAD and RCA back in 2006 for anterior STEMI LAD PCI with proximal upstream overlapping DES (Synergy XD 3.0 x 12 mm postdilated to 3.3 mm.  With scoring balloon PTCA of ISR) (April 2018) Most recent Myoview  was July 2020.  EF 55 to 65%.  Small sized mild severity fixed mid anteroseptal to apical distribution suggestive of prior infarct with no ischemia.  LOW RISK Paroxysmal VT dating back 2006 treated with flecainide for long period time, then discontinued.  Intolerant of amiodarone , mexiletine and even beta-blockers.  Now on low-dose diltiazem      Alexander Armstrong was last seen on July 20, 2023 for evaluation of fleeting chest discomfort around the loop recorder site.  Also some brief episodes of lightheaded and dizziness maybe 3-4 times a week.  He noticed that the  symptoms seem to have occurred after having stopped his diltiazem . => We restarted 120 mg diltiazem  XT, but mentioned low threshold to consider stress test.  Encouraged adequate hydration and Zio patch monitor check.  Subjective  Discussed the use of AI scribe software for clinical note transcription with the patient, who gave verbal consent to proceed.  History of Present Illness History of Present Illness Alexander Armstrong is an 83 year old male with coronary artery disease and multiple myeloma who presents with chest pain and fatigue.  He experiences chest pain that is not severe but occurs with exertion, described as tight and slightly hurting, accompanied by breathlessness with minimal exertion. He has a history of a heart attack 20 years ago and notes a recent drop in blood pressure to 87/67, lower than his usual 115/70. New swelling in his legs is also reported. No significant chest tightness or pressure at rest, only with exertion. No waking up at night unable to breathe or shortness of breath when lying down.  His glucose levels have been erratic, with a recent reading of 345 mg/dL in the morning after chemotherapy. He attributes fluctuations to steroid pills taken as part of his treatment, causing sugar levels to rise and take two to three days to normalize. He is not on any medication for glucose levels but monitors them regularly, noting an average of 140 mg/dL. His A1c was recently measured at 7.7%. He reports frequent urination, especially at night, occurring every two hours, which he associates with elevated  glucose levels.  He is undergoing treatment for multiple myeloma with Velcade  infusions every other week. His blood counts have been slightly anemic since diagnosis, with platelets just under normal. He experiences leg cramps and weakness and has been off statins for two weeks without significant improvement in symptoms.  He mentions a weight loss of about five pounds since his last  visit.     Objective   Current cardiac meds: Plavix  75 Miller daily, diltiazem  ER 100 mL daily, rosuvastatin  20 mg daily  Studies Reviewed: Alexander Armstrong   EKG Interpretation Date/Time:  Wednesday March 15 2024 16:22:56 EDT Ventricular Rate:  74 PR Interval:  228 QRS Duration:  80 QT Interval:  376 QTC Calculation: 417 R Axis:   66  Text Interpretation: Sinus rhythm with 1st degree A-V block with frequent Premature ventricular complexes When compared with ECG of 20-Jul-2023 15:59, Premature ventricular complexes are now Present Minimal criteria for Anterior infarct are no longer Present Confirmed by Randene Bustard ([id number]) on 03/15/2024 4:55:51 PM     LABS    Latest Ref Rng & Units 03/09/2024    9:20 AM 02/24/2024    9:26 AM 02/10/2024    8:55 AM  CBC  WBC 4.0 - 10.5 K/uL 7.6  9.5  11.2   Hemoglobin 13.0 - 17.0 g/dL 62.9  52.8  41.3   Hematocrit 39.0 - 52.0 % 36.8  35.1  37.3   Platelets 150 - 400 K/uL 147  163  144    Lab Results  Component Value Date   NA 139 03/09/2024   K 4.2 03/09/2024   CREATININE 1.22 03/09/2024   GFRNONAA 59 (L) 03/09/2024   GLUCOSE 162 (H) 03/09/2024   Lab Results  Component Value Date   HGBA1C 7.7 (A) 02/17/2024   Lab Results  Component Value Date   CHOL 142 12/23/2022   HDL 41.60 12/23/2022   LDLCALC 78 12/23/2022   TRIG 116.0 12/23/2022   CHOLHDL 3 12/23/2022   DIAGNOSTIC Nuclear stress test: No ischemia, old infarction in LAD territory (04/2019)  14-day Zio patch monitor (10/19-11/11/2022)    Predominant rhythm is sinus rhythm with a rate range of 52 to 129 bpm, average of 80 bpm; first-degree AV block noted.    Occasional PVCs-premature ventricular contractions (2.8%) with some rare couplets, bigeminy and trigeminy.    Rare(<1%) PACs-premature atrial contractions.    5 Atrial Runs noted: Fastest was only 4 beats at 1.6 second,  Heart rate range 118-141 bpm and an average of 131 bpm.  Longest was 8 beats (5.2 seconds) rate ranges 92 to 108 bpm  and average of 100 bpm.    No Sustained Arrhythmias: Atrial Tachycardia (AT), Supraventricular Tachycardia (SVT), Atrial Fibrillation (A-Fib), Atrial Flutter (A-Flutter), Sustained Ventricular Tachycardia (VT)    No significant bradycardia, sinus pauses or high-grade AV block noted.    Overall very reassuring monitor results.  No gross abnormalities noted.  Symptoms were noted with PVCs but these were still not that frequent at 2.8%.   Risk Assessment/Calculations:              Physical Exam:   VS:  BP (!) 92/40 (BP Location: Left Arm, Patient Position: Sitting)   Pulse 74   Ht 5' 11 (1.803 m)   Wt 197 lb 3.2 oz (89.4 kg)   SpO2 96%   BMI 27.50 kg/m    Wt Readings from Last 3 Encounters:  03/15/24 197 lb 3.2 oz (89.4 kg)  03/09/24 197 lb (89.4 kg)  02/17/24 199  lb (90.3 kg)  BP is somewhat low today, and has been in the past but is usually 120s at home  GEN: Well nourished, well groomed in no acute distress; mild truncal obesity but otherwise healthy appearing NECK: No JVD; No carotid bruits CARDIAC:  RRR, with occasional ectopy.  Normal S1 and S2;no murmurs, rubs, gallops RESPIRATORY:  Clear to auscultation without rales, wheezing or rhonchi ; nonlabored, good air movement. ABDOMEN: Soft, non-tender, non-distended EXTREMITIES: Trace ankle edema edema; No deformity      ASSESSMENT AND PLAN: .    Problem List Items Addressed This Visit       Cardiology Problems   Atypical angina (HCC) - Primary (Chronic)   Relevant Medications   furosemide (LASIX) 20 MG tablet   Other Relevant Orders   MYOCARDIAL PERFUSION IMAGING   Cardiac Stress Test: Informed Consent Details: Physician/Practitioner Attestation; Transcribe to consent form and obtain patient signature   Coronary artery disease involving native coronary artery of native heart without angina pectoris (Chronic)   He never really ever had a angina even the setting of his MI. Intermittent chest pain, dyspnea on  exertion, and peripheral edema suggest potential cardiac issues. Nuclear stress test considered to assess ejection fraction and rule out ischemia. Discussed nuclear stress test utility and limitations. Potential use of drug-coated balloons for in-stent restenosis. - Order Lexiscan  Myoview  NuclearStress Test to assess ejection fraction and rule out ischemia. - Continue rosuvastatin  20 mg daily and diltiazem  120 Miller daily. - With LAD and RCA stents in the LAD being overlapping, he is on maintenance Plavix .  Okay to hold Plavix  5 to 7 days preop for surgeries or procedures.      Relevant Medications   furosemide (LASIX) 20 MG tablet   Other Relevant Orders   EKG 12-Lead (Completed)   MYOCARDIAL PERFUSION IMAGING   Cardiac Stress Test: Informed Consent Details: Physician/Practitioner Attestation; Transcribe to consent form and obtain patient signature   Hyperlipidemia associated with type 2 diabetes mellitus (HCC) (Chronic)   Lipids well-controlled on current dose of rosuvastatin  20-minute daily.  He is due for follow-up labs soon as they have not been checked since 2024-at which time LDL was 78.  May need more aggressive therapy  Elevated glucose levels likely due to prednisone associated with chemotherapy. Glucose levels reach 345 mg/dL post-chemotherapy, returning to baseline after 2-3 days. A1c is 7.7%. - Monitor blood glucose levels regularly..       Relevant Medications   furosemide (LASIX) 20 MG tablet   Hypotension (Chronic)   Has always had some lower blood pressures are currently lower than usual.  He says at home is usually in the 120s.  I think we are fine continuing his current dose of diltiazem  120 mg daily.  Somewhat concerning with his having some dyspnea and fatigue that could be related to hypotension.  To weigh the benefits of low-dose A Rock or Higher Blood Pressures. Had previously been on midodrine , will hold off for now.      Relevant Medications   furosemide  (LASIX) 20 MG tablet   Other Relevant Orders   EKG 12-Lead (Completed)   MYOCARDIAL PERFUSION IMAGING   Paroxysmal VT (HCC) (Chronic)   No recurrent symptoms, most recent monitor only showed 2.8% PVCs.  Continue diltiazem  per EP      Relevant Medications   furosemide (LASIX) 20 MG tablet     Other   History of acute anterior wall MI (Chronic)   19 years out from his MI.  Never  really had symptoms at that time.  In 2018 he had significant LAD stenosis noted in surveillance stress test without notable symptoms.  With him having some dyspnea exertion and fatigue we will reassess for ischemia with a Myoview  stress test.      Relevant Orders   MYOCARDIAL PERFUSION IMAGING   Cardiac Stress Test: Informed Consent Details: Physician/Practitioner Attestation; Transcribe to consent form and obtain patient signature   Lower extremity edema   Not associate with PND orthopnea but is associate with some exertional dyspnea.  Does not really seem related to CHF may be more guarded venous stasis or lymphedema.  Does not appear to be significantly edematous on exam.  Will order low-dose Lasix 20 Mg with use PRN to try again by Dr. Baruch Likens weight      Multiple myeloma (HCC) (Chronic)   Ongoing therapy.  Has some mild anemia with hemoglobin levels in the high 11s to mid 12s, not severe enough to cause significant symptoms. May contribute to fatigue and dyspnea.      Wide-complex tachycardia - WCT (HCC) (Chronic)   See above.  No recurrent symptoms.  Continue standing dose of diltiazem .      Relevant Orders   MYOCARDIAL PERFUSION IMAGING   \    Informed Consent   Shared Decision Making/Informed Consent The risks [chest pain, shortness of breath, cardiac arrhythmias, dizziness, blood pressure fluctuations, myocardial infarction, stroke/transient ischemic attack, nausea, vomiting, allergic reaction, radiation exposure, metallic taste sensation and life-threatening complications (estimated to be 1 in  10,000)], benefits (risk stratification, diagnosing coronary artery disease, treatment guidance) and alternatives of a nuclear stress test were discussed in detail with Alexander Armstrong and he agrees to proceed.      Follow-Up: Return in about 4 months (around 07/15/2024).  Total time spent: 32 min spent with patient + 25 min spent charting = 57 min I spent 57 minutes in the care of Alexander Armstrong today including reviewing labs (2 minutes), reviewing studies (5 minutes reviewing prior cath films and stress test for comparison), face to face time discussing treatment options (32), reviewing records from last notes from oncology (6 minutes), 12 minutes dictating, and documenting in the encounter.    Signed, Arleen Lacer, MD, MS Randene Bustard, M.D., M.S. Interventional Chartered certified accountant  Pager # [phone]

## 2024-03-16 NOTE — Assessment & Plan Note (Signed)
 Ongoing therapy.  Has some mild anemia with hemoglobin levels in the high 11s to mid 12s, not severe enough to cause significant symptoms. May contribute to fatigue and dyspnea.

## 2024-03-16 NOTE — Assessment & Plan Note (Signed)
 Lipids well-controlled on current dose of rosuvastatin  20-minute daily.  He is due for follow-up labs soon as they have not been checked since 2024-at which time LDL was 78.  May need more aggressive therapy  Elevated glucose levels likely due to prednisone associated with chemotherapy. Glucose levels reach 345 mg/dL post-chemotherapy, returning to baseline after 2-3 days. A1c is 7.7%. - Monitor blood glucose levels regularly.Aaron Aas

## 2024-03-16 NOTE — Assessment & Plan Note (Signed)
 No recurrent symptoms, most recent monitor only showed 2.8% PVCs.  Continue diltiazem  per EP

## 2024-03-16 NOTE — Assessment & Plan Note (Signed)
 He never really ever had a angina even the setting of his MI. Intermittent chest pain, dyspnea on exertion, and peripheral edema suggest potential cardiac issues. Nuclear stress test considered to assess ejection fraction and rule out ischemia. Discussed nuclear stress test utility and limitations. Potential use of drug-coated balloons for in-stent restenosis. - Order Lexiscan  Myoview  NuclearStress Test to assess ejection fraction and rule out ischemia. - Continue rosuvastatin  20 mg daily and diltiazem  120 Miller daily. - With LAD and RCA stents in the LAD being overlapping, he is on maintenance Plavix .  Okay to hold Plavix  5 to 7 days preop for surgeries or procedures.

## 2024-03-16 NOTE — Assessment & Plan Note (Signed)
 Has always had some lower blood pressures are currently lower than usual.  He says at home is usually in the 120s.  I think we are fine continuing his current dose of diltiazem  120 mg daily.  Somewhat concerning with his having some dyspnea and fatigue that could be related to hypotension.  To weigh the benefits of low-dose A Rock or Higher Blood Pressures. Had previously been on midodrine , will hold off for now.

## 2024-03-16 NOTE — Assessment & Plan Note (Signed)
 19 years out from his MI.  Never really had symptoms at that time.  In 2018 he had significant LAD stenosis noted in surveillance stress test without notable symptoms.  With him having some dyspnea exertion and fatigue we will reassess for ischemia with a Myoview  stress test.

## 2024-03-17 ENCOUNTER — Encounter: Payer: Self-pay | Admitting: Family Medicine

## 2024-03-20 ENCOUNTER — Telehealth (HOSPITAL_COMMUNITY): Payer: Self-pay | Admitting: *Deleted

## 2024-03-20 ENCOUNTER — Encounter (HOSPITAL_COMMUNITY): Payer: Self-pay | Admitting: *Deleted

## 2024-03-20 NOTE — Telephone Encounter (Signed)
 See below

## 2024-03-20 NOTE — Telephone Encounter (Signed)
 Reminder and instruction letter sent for upcoming stress test on 03/28/24 via [employer]

## 2024-03-23 ENCOUNTER — Inpatient Hospital Stay

## 2024-03-23 ENCOUNTER — Other Ambulatory Visit: Payer: Self-pay | Admitting: Cardiology

## 2024-03-23 VITALS — BP 117/66 | HR 62 | Temp 97.6°F | Resp 18

## 2024-03-23 DIAGNOSIS — R000 Tachycardia, unspecified: Secondary | ICD-10-CM

## 2024-03-23 DIAGNOSIS — I252 Old myocardial infarction: Secondary | ICD-10-CM

## 2024-03-23 DIAGNOSIS — I2089 Other forms of angina pectoris: Secondary | ICD-10-CM

## 2024-03-23 DIAGNOSIS — Z5112 Encounter for antineoplastic immunotherapy: Secondary | ICD-10-CM | POA: Diagnosis not present

## 2024-03-23 DIAGNOSIS — I2510 Atherosclerotic heart disease of native coronary artery without angina pectoris: Secondary | ICD-10-CM

## 2024-03-23 DIAGNOSIS — C9000 Multiple myeloma not having achieved remission: Secondary | ICD-10-CM

## 2024-03-23 DIAGNOSIS — I9589 Other hypotension: Secondary | ICD-10-CM

## 2024-03-23 LAB — CBC WITH DIFFERENTIAL (CANCER CENTER ONLY)
Abs Immature Granulocytes: 0.02 10*3/uL (ref 0.00–0.07)
Basophils Absolute: 0.1 10*3/uL (ref 0.0–0.1)
Basophils Relative: 1 %
Eosinophils Absolute: 0.4 10*3/uL (ref 0.0–0.5)
Eosinophils Relative: 5 %
HCT: 36.5 % — ABNORMAL LOW (ref 39.0–52.0)
Hemoglobin: 12.3 g/dL — ABNORMAL LOW (ref 13.0–17.0)
Immature Granulocytes: 0 %
Lymphocytes Relative: 21 %
Lymphs Abs: 1.9 10*3/uL (ref 0.7–4.0)
MCH: 32.3 pg (ref 26.0–34.0)
MCHC: 33.7 g/dL (ref 30.0–36.0)
MCV: 95.8 fL (ref 80.0–100.0)
Monocytes Absolute: 0.9 10*3/uL (ref 0.1–1.0)
Monocytes Relative: 10 %
Neutro Abs: 5.7 10*3/uL (ref 1.7–7.7)
Neutrophils Relative %: 63 %
Platelet Count: 167 10*3/uL (ref 150–400)
RBC: 3.81 MIL/uL — ABNORMAL LOW (ref 4.22–5.81)
RDW: 12.6 % (ref 11.5–15.5)
WBC Count: 9.1 10*3/uL (ref 4.0–10.5)
nRBC: 0 % (ref 0.0–0.2)

## 2024-03-23 LAB — CMP (CANCER CENTER ONLY)
ALT: 12 U/L (ref 0–44)
AST: 16 U/L (ref 15–41)
Albumin: 4.2 g/dL (ref 3.5–5.0)
Alkaline Phosphatase: 69 U/L (ref 38–126)
Anion gap: 6 (ref 5–15)
BUN: 23 mg/dL (ref 8–23)
CO2: 29 mmol/L (ref 22–32)
Calcium: 9 mg/dL (ref 8.9–10.3)
Chloride: 105 mmol/L (ref 98–111)
Creatinine: 1.21 mg/dL (ref 0.61–1.24)
GFR, Estimated: 60 mL/min — ABNORMAL LOW (ref >60)
Glucose, Bld: 132 mg/dL — ABNORMAL HIGH (ref 70–99)
Potassium: 5 mmol/L (ref 3.5–5.1)
Sodium: 140 mmol/L (ref 135–145)
Total Bilirubin: 0.5 mg/dL (ref 0.0–1.2)
Total Protein: 5.9 g/dL — ABNORMAL LOW (ref 6.5–8.1)

## 2024-03-23 MED ORDER — PROCHLORPERAZINE MALEATE 10 MG PO TABS
10.0000 mg | ORAL_TABLET | Freq: Once | ORAL | Status: DC
Start: 2024-03-23 — End: 2024-03-23

## 2024-03-23 MED ORDER — BORTEZOMIB CHEMO SQ INJECTION 3.5 MG (2.5MG/ML)
1.3000 mg/m2 | Freq: Once | INTRAMUSCULAR | Status: AC
  Administered 2024-03-23: 2.75 mg via SUBCUTANEOUS
  Filled 2024-03-23: qty 1.1

## 2024-03-23 NOTE — Patient Instructions (Signed)
 [facility]  Discharge Instructions: Thank you for choosing [facility] to provide your oncology and hematology care.   If you have a lab appointment with the Cancer Center, please go directly to the Cancer Center and check in at the registration area.  Wear comfortable clothing and clothing appropriate for easy access to any Portacath or PICC line.   We strive to give you quality time with your provider. You may need to reschedule your appointment if you arrive late (15 or more minutes).  Arriving late affects you and other patients whose appointments are after yours.  Also, if you miss three or more appointments without notifying the office, you may be dismissed from the clinic at the provider's discretion.      For prescription refill requests, have your pharmacy contact our office and allow 72 hours for refills to be completed.    Today you received the following chemotherapy and/or immunotherapy agents Velcade      To help prevent nausea and vomiting after your treatment, we encourage you to take your nausea medication as directed.  BELOW ARE SYMPTOMS THAT SHOULD BE REPORTED IMMEDIATELY: *FEVER GREATER THAN 100.4 F (38 C) OR HIGHER *CHILLS OR SWEATING *NAUSEA AND VOMITING THAT IS NOT CONTROLLED WITH YOUR NAUSEA MEDICATION *UNUSUAL SHORTNESS OF BREATH *UNUSUAL BRUISING OR BLEEDING *URINARY PROBLEMS (pain or burning when urinating, or frequent urination) *BOWEL PROBLEMS (unusual diarrhea, constipation, pain near the anus) TENDERNESS IN MOUTH AND THROAT WITH OR WITHOUT PRESENCE OF ULCERS (sore throat, sores in mouth, or a toothache) UNUSUAL RASH, SWELLING OR PAIN  UNUSUAL VAGINAL DISCHARGE OR ITCHING   Items with * indicate a potential emergency and should be followed up as soon as possible or go to the Emergency Department if any problems should occur.  Please show the CHEMOTHERAPY ALERT CARD or IMMUNOTHERAPY  ALERT CARD at check-in to the Emergency Department and triage nurse. Should you have questions after your visit or need to cancel or reschedule your appointment, please contact [facility] [facility] - A DEPT OF [facility]  [phone] and follow the prompts.  Office hours are 8:00 a.m. to 4:30 p.m. Monday - Friday. Please note that voicemails left after 4:00 p.m. may not be returned until the following business day.  We are closed weekends and major holidays. You have access to a nurse at all times for urgent questions. Please call the main number to the clinic [phone] and follow the prompts.  For any non-urgent questions, you may also contact your provider using MyChart. We now offer e-Visits for anyone 69 and older to request care online for non-urgent symptoms. For details visit [email or web address].   Also download the MyChart app! Go to the app store, search "MyChart", open the app, select [place], and log in with your MyChart username and password.

## 2024-03-26 ENCOUNTER — Other Ambulatory Visit: Payer: Self-pay | Admitting: Cardiology

## 2024-03-28 ENCOUNTER — Ambulatory Visit (HOSPITAL_COMMUNITY)
Admission: RE | Admit: 2024-03-28 | Discharge: 2024-03-28 | Disposition: A | Discharge status: Home / Self Care | Source: Ambulatory Visit | Attending: Internal Medicine | Admitting: Internal Medicine

## 2024-03-28 DIAGNOSIS — I2089 Other forms of angina pectoris: Secondary | ICD-10-CM | POA: Diagnosis present

## 2024-03-28 DIAGNOSIS — R000 Tachycardia, unspecified: Secondary | ICD-10-CM | POA: Diagnosis not present

## 2024-03-28 DIAGNOSIS — I25118 Atherosclerotic heart disease of native coronary artery with other forms of angina pectoris: Secondary | ICD-10-CM | POA: Insufficient documentation

## 2024-03-28 DIAGNOSIS — I9589 Other hypotension: Secondary | ICD-10-CM | POA: Insufficient documentation

## 2024-03-28 DIAGNOSIS — I252 Old myocardial infarction: Secondary | ICD-10-CM | POA: Insufficient documentation

## 2024-03-28 DIAGNOSIS — I2510 Atherosclerotic heart disease of native coronary artery without angina pectoris: Secondary | ICD-10-CM | POA: Diagnosis present

## 2024-03-28 LAB — MYOCARDIAL PERFUSION IMAGING
LV dias vol: 102 mL (ref 62–150)
LV sys vol: 27 mL (ref 4.2–5.8)
Nuc Stress EF: 73 %
Peak HR: 81 {beats}/min
Rest HR: 55 {beats}/min
Rest Nuclear Isotope Dose: 10.2 mCi
ST Depression (mm): 0 mm
Stress Nuclear Isotope Dose: 32 mCi
TID: 1.06

## 2024-03-28 MED ORDER — REGADENOSON 0.4 MG/5ML IV SOLN
0.4000 mg | Freq: Once | INTRAVENOUS | Status: AC
  Administered 2024-03-28: 0.4 mg via INTRAVENOUS

## 2024-03-28 MED ORDER — TECHNETIUM TC 99M TETROFOSMIN IV KIT
32.0000 | PACK | Freq: Once | INTRAVENOUS | Status: AC | PRN
  Administered 2024-03-28: 32 via INTRAVENOUS

## 2024-03-28 MED ORDER — REGADENOSON 0.4 MG/5ML IV SOLN
INTRAVENOUS | Status: AC
  Filled 2024-03-28: qty 5

## 2024-03-28 MED ORDER — TECHNETIUM TC 99M TETROFOSMIN IV KIT
10.2000 | PACK | Freq: Once | INTRAVENOUS | Status: AC | PRN
  Administered 2024-03-28: 10.2 via INTRAVENOUS

## 2024-03-30 ENCOUNTER — Ambulatory Visit: Payer: Self-pay | Admitting: Cardiology

## 2024-04-04 ENCOUNTER — Encounter: Payer: Self-pay | Admitting: Cardiology

## 2024-04-04 NOTE — Telephone Encounter (Signed)
 This note serves an addendum to clinic note from March 23, 2024   I reviewed the results of the nuclear stress test with Alexander Armstrong.  He continues to have exertional dyspnea beyond what he usually would have.  We discussed the findings showing that in addition to the previously documented distal anteroapical defect, if there is a new inferior defect which appears to be reversible.     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  It is possible that there is progression of disease in the RCA versus to simply evidence of his prior event involving the RCA as well.   Based on the fact that he is still having exertional dyspnea we decided the best course of action would be to proceed with cardiac catheterization and possible PCI.   We will plan to schedule this for July 14.   Informed Consent  Shared Decision Making/Informed Consent The risks [stroke (1 in 1000), death (1 in 1000), kidney failure [usually temporary] (1 in 500), bleeding (1 in 200), allergic reaction [possibly serious] (1 in 200)], benefits (diagnostic support and management of coronary artery disease) and alternatives of a cardiac catheterization were discussed in detail with [name] and he is willing to proceed.          Alm Clay, MD

## 2024-04-05 ENCOUNTER — Telehealth: Payer: Self-pay | Admitting: *Deleted

## 2024-04-05 DIAGNOSIS — I2089 Other forms of angina pectoris: Secondary | ICD-10-CM

## 2024-04-05 DIAGNOSIS — R0609 Other forms of dyspnea: Secondary | ICD-10-CM

## 2024-04-05 NOTE — Telephone Encounter (Signed)
 This note serves an addendum to clinic note from March 23, 2024   I reviewed the results of the nuclear stress test with Alexander Armstrong.  He continues to have exertional dyspnea beyond what he usually would have.  We discussed the findings showing that in addition to the previously documented distal anteroapical defect, if there is a new inferior defect which appears to be reversible.     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  It is possible that there is progression of disease in the RCA versus to simply evidence of his prior event involving the RCA as well.   Based on the fact that he is still having exertional dyspnea we decided the best course of action would be to proceed with cardiac catheterization and possible PCI.   We will plan to schedule this for July 14.   Informed Consent  Shared Decision Making/Informed Consent The risks [stroke (1 in 1000), death (1 in 1000), kidney failure [usually temporary] (1 in 500), bleeding (1 in 200), allergic reaction [possibly serious] (1 in 200)], benefits (diagnostic support and management of coronary artery disease) and alternatives of a cardiac catheterization were discussed in detail with [name] and he is willing to proceed.          Alm Clay, MD

## 2024-04-05 NOTE — Telephone Encounter (Signed)
 LEFT HEART CATH SCHEDULE FOR 04/17/24.  INSTRUCTION AND DIRECTION WILL BE SENT THE PATIENT BY MCHART   VERBAL INSTRUCTION WILL BE GIVEN.

## 2024-04-05 NOTE — Telephone Encounter (Signed)
 Will give the patient  a call and given instruction and direction    Cath schedule for 04/17/24 at 9 am .

## 2024-04-06 ENCOUNTER — Encounter: Payer: Self-pay | Admitting: Hematology & Oncology

## 2024-04-06 ENCOUNTER — Other Ambulatory Visit: Payer: Self-pay | Admitting: *Deleted

## 2024-04-06 ENCOUNTER — Inpatient Hospital Stay: Admitting: Hematology & Oncology

## 2024-04-06 ENCOUNTER — Inpatient Hospital Stay: Attending: Hematology & Oncology

## 2024-04-06 ENCOUNTER — Inpatient Hospital Stay

## 2024-04-06 VITALS — BP 112/66 | HR 84 | Temp 97.6°F | Resp 20 | Ht 71.0 in | Wt 196.1 lb

## 2024-04-06 DIAGNOSIS — Z5112 Encounter for antineoplastic immunotherapy: Secondary | ICD-10-CM | POA: Diagnosis present

## 2024-04-06 DIAGNOSIS — C9001 Multiple myeloma in remission: Secondary | ICD-10-CM | POA: Diagnosis not present

## 2024-04-06 DIAGNOSIS — R0602 Shortness of breath: Secondary | ICD-10-CM

## 2024-04-06 DIAGNOSIS — C9000 Multiple myeloma not having achieved remission: Secondary | ICD-10-CM | POA: Insufficient documentation

## 2024-04-06 DIAGNOSIS — I2510 Atherosclerotic heart disease of native coronary artery without angina pectoris: Secondary | ICD-10-CM

## 2024-04-06 DIAGNOSIS — D6481 Anemia due to antineoplastic chemotherapy: Secondary | ICD-10-CM

## 2024-04-06 DIAGNOSIS — R0609 Other forms of dyspnea: Secondary | ICD-10-CM

## 2024-04-06 DIAGNOSIS — D6959 Other secondary thrombocytopenia: Secondary | ICD-10-CM

## 2024-04-06 LAB — CBC WITH DIFFERENTIAL (CANCER CENTER ONLY)
Abs Immature Granulocytes: 0.02 10*3/uL (ref 0.00–0.07)
Basophils Absolute: 0.1 10*3/uL (ref 0.0–0.1)
Basophils Relative: 1 %
Eosinophils Absolute: 0.5 10*3/uL (ref 0.0–0.5)
Eosinophils Relative: 5 %
HCT: 35.8 % — ABNORMAL LOW (ref 39.0–52.0)
Hemoglobin: 12 g/dL — ABNORMAL LOW (ref 13.0–17.0)
Immature Granulocytes: 0 %
Lymphocytes Relative: 18 %
Lymphs Abs: 1.7 10*3/uL (ref 0.7–4.0)
MCH: 32 pg (ref 26.0–34.0)
MCHC: 33.5 g/dL (ref 30.0–36.0)
MCV: 95.5 fL (ref 80.0–100.0)
Monocytes Absolute: 0.9 10*3/uL (ref 0.1–1.0)
Monocytes Relative: 9 %
Neutro Abs: 6.1 10*3/uL (ref 1.7–7.7)
Neutrophils Relative %: 67 %
Platelet Count: 147 10*3/uL — ABNORMAL LOW (ref 150–400)
RBC: 3.75 MIL/uL — ABNORMAL LOW (ref 4.22–5.81)
RDW: 12.6 % (ref 11.5–15.5)
WBC Count: 9.3 10*3/uL (ref 4.0–10.5)
nRBC: 0 % (ref 0.0–0.2)

## 2024-04-06 LAB — LACTATE DEHYDROGENASE: LDH: 139 U/L (ref 98–192)

## 2024-04-06 LAB — CMP (CANCER CENTER ONLY)
ALT: 9 U/L (ref 0–44)
AST: 14 U/L — ABNORMAL LOW (ref 15–41)
Albumin: 4.2 g/dL (ref 3.5–5.0)
Alkaline Phosphatase: 64 U/L (ref 38–126)
Anion gap: 9 (ref 5–15)
BUN: 30 mg/dL — ABNORMAL HIGH (ref 8–23)
CO2: 24 mmol/L (ref 22–32)
Calcium: 9.2 mg/dL (ref 8.9–10.3)
Chloride: 105 mmol/L (ref 98–111)
Creatinine: 1.22 mg/dL (ref 0.61–1.24)
GFR, Estimated: 59 mL/min — ABNORMAL LOW
Glucose, Bld: 178 mg/dL — ABNORMAL HIGH (ref 70–99)
Potassium: 4 mmol/L (ref 3.5–5.1)
Sodium: 138 mmol/L (ref 135–145)
Total Bilirubin: 0.6 mg/dL (ref 0.0–1.2)
Total Protein: 6.1 g/dL — ABNORMAL LOW (ref 6.5–8.1)

## 2024-04-06 MED ORDER — BORTEZOMIB CHEMO SQ INJECTION 3.5 MG (2.5MG/ML)
1.3000 mg/m2 | Freq: Once | INTRAMUSCULAR | Status: AC
  Administered 2024-04-06: 2.75 mg via SUBCUTANEOUS
  Filled 2024-04-06: qty 1.1

## 2024-04-06 MED ORDER — ACETAMINOPHEN 325 MG PO TABS
650.0000 mg | ORAL_TABLET | Freq: Once | ORAL | Status: DC
Start: 2024-04-06 — End: 2024-04-06

## 2024-04-06 MED ORDER — DARATUMUMAB-HYALURONIDASE-FIHJ 1800-30000 MG-UT/15ML ~~LOC~~ SOLN
1800.0000 mg | Freq: Once | SUBCUTANEOUS | Status: AC
  Administered 2024-04-06: 1800 mg via SUBCUTANEOUS
  Filled 2024-04-06: qty 15

## 2024-04-06 MED ORDER — DIPHENHYDRAMINE HCL 25 MG PO CAPS: 50.0000 mg | ORAL_CAPSULE | Freq: Once | ORAL | Status: DC

## 2024-04-06 NOTE — Progress Notes (Signed)
 Hematology and Oncology Follow Up Visit  Alexander Armstrong [id number] [birth year] 83 y.o. 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 --Start on 10/29/2022     Interim History:  Mr. Stillings is back for follow-up.  The big news is that he is going to have to have a cardiac cath on 04/17/2024.  He did have a little bit of chest discomfort.  Hopefully, he will not need to have a bypass.  I know that he has had past cardiac cath and stents.  Otherwise, he is doing pretty well.  He is tolerated treatment nicely.  His last myeloma studies showed negative monoclonal spike in his blood.  His IgG level was 507 mg/dL.  The Kappa light chain was t 0.4 mg/dL.  He has had no fever.  Has had no change in bowel or bladder habits.  His blood sugars have been on the high side.  He is trying to monitor this.  He has had no rashes.  There is been no leg swelling.  Overall, I would say that his performance status is probably ECOG 1.     Wt Readings from Last 3 Encounters:  04/06/24 196 lb 1.9 oz (89 kg)  03/28/24 197 lb (89.4 kg)  03/15/24 197 lb 3.2 oz (89.4 kg)     Medications:  Current Outpatient Medications:    acetaminophen  (TYLENOL ) 500 MG tablet, Take 500 mg by mouth 3 (three) times daily as needed for moderate pain (pain score 4-6) or headache., Disp: , Rfl:    aspirin  EC 81 MG tablet, Take 81 mg by mouth daily. Swallow whole., Disp: , Rfl:    Blood Glucose Monitoring Suppl DEVI, 1 each by Does not apply route in the morning, at noon, and at bedtime. May substitute to any manufacturer covered by patient's insurance., Disp: 1 each, Rfl: 0   Cholecalciferol (VITAMIN D3) 1000 units CAPS,  Take 1,000 Units by mouth at bedtime., Disp: , Rfl:    clopidogrel  (PLAVIX ) 75 MG tablet, Take 1 tablet (75 mg total) by mouth daily. TAKE 1 TABLET(75 MG) BY MOUTH DAILY, Disp: 90 tablet, Rfl: 3   Cyanocobalamin  1000 MCG TBCR, Take 1,000 mcg by mouth daily., Disp: , Rfl:    dexamethasone  (DECADRON ) 4 MG tablet, TAKE 5 TABS (20 MG) WEEKLY THE DAY OF DARATUMUMAB . TAKE WITH BREAKFAST., Disp: 60 tablet, Rfl: 5   diltiazem  (TIADYLT  ER) 120 MG 24 hr capsule, Take 1 capsule (120 mg total) by mouth daily., Disp: 90 capsule, Rfl: 3   diphenhydrAMINE  (BENADRYL  ALLERGY) 25 MG tablet, Take 50 mg by mouth See admin instructions. Take 50 mg by mouth one hour prior to Daratumumab  infusion on Mondays, Disp: , Rfl:    Docusate Sodium  (DSS) 100 MG CAPS, Take by mouth daily., Disp: , Rfl:    famciclovir  (FAMVIR ) 250 MG tablet, TAKE 1 TABLET BY MOUTH EVERY DAY, Disp: 90 tablet, Rfl: 2   fluticasone (FLONASE) 50 MCG/ACT nasal spray, Place 1 spray into both nostrils daily as needed for allergies or rhinitis., Disp: , Rfl:    furosemide  (LASIX ) 20 MG tablet, Take 1 tablet (20 mg total) by mouth daily as needed., Disp: 30 tablet, Rfl: 11   loratadine (CLARITIN) 10 MG tablet, Take 10 mg by mouth  in the morning., Disp: , Rfl:    MAGNESIUM PO, Take by mouth daily., Disp: , Rfl:    meclizine  (ANTIVERT ) 25 MG tablet, Take 1 tablet (25 mg total) by mouth 3 (three) times daily as needed for dizziness., Disp: 30 tablet, Rfl: 5   montelukast  (SINGULAIR ) 10 MG tablet, Take 10 mg by mouth daily., Disp: , Rfl:    pantoprazole  (PROTONIX ) 40 MG tablet, TAKE 1 TABLET BY MOUTH EVERY DAY, Disp: 90 tablet, Rfl: 3   polyethylene glycol (MIRALAX  / GLYCOLAX ) 17 g packet, Take 17 g by mouth daily as needed., Disp: , Rfl:    REFRESH PLUS 0.5 % SOLN, Place 1 drop into both eyes 3 (three) times daily as needed (for dryness)., Disp: , Rfl:    rosuvastatin  (CRESTOR ) 20 MG tablet, TAKE 1 TABLET BY MOUTH EVERY DAY, Disp: 90 tablet, Rfl: 3    tamsulosin  (FLOMAX ) 0.4 MG CAPS capsule, TAKE 1 CAPSULE BY MOUTH EVERYDAY AT BEDTIME, Disp: 90 capsule, Rfl: 3  Allergies:  Allergies  Allergen Reactions   Latex Other (See Comments)    takes the skin off (tape)    Past Medical History, Surgical history, Social history, and Family History were reviewed and updated.  Review of Systems: Review of Systems  Constitutional: Negative.   HENT:  Negative.    Eyes: Negative.   Respiratory: Negative.    Cardiovascular: Negative.   Gastrointestinal: Negative.   Endocrine: Negative.   Genitourinary: Negative.    Musculoskeletal: Negative.   Skin: Negative.   Neurological: Negative.   Hematological: Negative.   Psychiatric/Behavioral: Negative.      Physical Exam:  Vitals:   04/06/24 1000  BP: 112/66  Pulse: 84  Resp: 20  Temp: 97.6 F (36.4 C)  SpO2: 100%     Wt Readings from Last 3 Encounters:  04/06/24 196 lb 1.9 oz (89 kg)  03/28/24 197 lb (89.4 kg)  03/15/24 197 lb 3.2 oz (89.4 kg)    Physical Exam Vitals reviewed.  HENT:     Head: Normocephalic and atraumatic.  Eyes:     Pupils: Pupils are equal, round, and reactive to light.  Cardiovascular:     Rate and Rhythm: Normal rate and regular rhythm.     Heart sounds: Normal heart sounds.  Pulmonary:     Effort: Pulmonary effort is normal.     Breath sounds: Normal breath sounds.  Abdominal:     General: Bowel sounds are normal.     Palpations: Abdomen is soft.  Musculoskeletal:        General: No tenderness or deformity. Normal range of motion.     Cervical back: Normal range of motion.  Lymphadenopathy:     Cervical: No cervical adenopathy.  Skin:    General: Skin is warm and dry.     Findings: No erythema or rash.  Neurological:     Mental Status: He is alert and oriented to person, place, and time.  Psychiatric:        Behavior: Behavior normal.        Thought Content: Thought content normal.        Judgment: Judgment normal.     Lab Results   Component Value Date   WBC 9.3 04/06/2024   HGB 12.0 (L) 04/06/2024   HCT 35.8 (L) 04/06/2024   MCV 95.5 04/06/2024   PLT 147 (L) 04/06/2024     Chemistry      Component Value Date/Time   NA 138 04/06/2024 0942   K 4.0  04/06/2024 0942   CL 105 04/06/2024 0942   CO2 24 04/06/2024 0942   BUN 30 (H) 04/06/2024 0942   CREATININE 1.22 04/06/2024 0942   CREATININE 1.33 (H) 01/21/2017 1616      Component Value Date/Time   CALCIUM  9.2 04/06/2024 0942   ALKPHOS 64 04/06/2024 0942   AST 14 (L) 04/06/2024 0942   ALT 9 04/06/2024 0942   BILITOT 0.6 04/06/2024 0942       Impression and Plan: Mr. Mallari is a very nice 83 year old white male.  He has IgG kappa myeloma.  His chromosomal abnormalities resolved with treatment. Currently he is being treated with Darzalex  Faspro/ Velcade .   01/30/2024 PET scan did not show evidence of bony abnormality.   Monoclonal labs continue to be stable.   I do not see a problem to him having the cardiac cath.  His blood counts are okay.  If he does need to have any kind of cardiac surgery, again I do not see a problem with this.  We will go ahead with his treatment.  I will plan to see him back in another month or so.   Maude JONELLE Crease, MD 7/3/202510:49 AM

## 2024-04-06 NOTE — Telephone Encounter (Signed)
 RN spoke to patient.  Direction given  and  written instructions faxed to patient via mychart.   Patient verbalized understanding ans aware he may contact office for any questions.

## 2024-04-06 NOTE — Patient Instructions (Signed)
 [facility] - A DEPT OF [facility]  Discharge Instructions: Thank you for choosing [facility] to provide your oncology and hematology care.   If you have a lab appointment with the Cancer Center, please go directly to the Cancer Center and check in at the registration area.  Wear comfortable clothing and clothing appropriate for easy access to any Portacath or PICC line.   We strive to give you quality time with your provider. You may need to reschedule your appointment if you arrive late (15 or more minutes).  Arriving late affects you and other patients whose appointments are after yours.  Also, if you miss three or more appointments without notifying the office, you may be dismissed from the clinic at the provider's discretion.      For prescription refill requests, have your pharmacy contact our office and allow 72 hours for refills to be completed.    Today you received the following chemotherapy and/or immunotherapy agents faspro, velcade      To help prevent nausea and vomiting after your treatment, we encourage you to take your nausea medication as directed.  BELOW ARE SYMPTOMS THAT SHOULD BE REPORTED IMMEDIATELY: *FEVER GREATER THAN 100.4 F (38 C) OR HIGHER *CHILLS OR SWEATING *NAUSEA AND VOMITING THAT IS NOT CONTROLLED WITH YOUR NAUSEA MEDICATION *UNUSUAL SHORTNESS OF BREATH *UNUSUAL BRUISING OR BLEEDING *URINARY PROBLEMS (pain or burning when urinating, or frequent urination) *BOWEL PROBLEMS (unusual diarrhea, constipation, pain near the anus) TENDERNESS IN MOUTH AND THROAT WITH OR WITHOUT PRESENCE OF ULCERS (sore throat, sores in mouth, or a toothache) UNUSUAL RASH, SWELLING OR PAIN  UNUSUAL VAGINAL DISCHARGE OR ITCHING   Items with * indicate a potential emergency and should be followed up as soon as possible or go to the Emergency Department if any problems should occur.  Please show the CHEMOTHERAPY ALERT CARD or  IMMUNOTHERAPY ALERT CARD at check-in to the Emergency Department and triage nurse. Should you have questions after your visit or need to cancel or reschedule your appointment, please contact [facility] - A DEPT OF [facility]  [phone] and follow the prompts.  Office hours are 8:00 a.m. to 4:30 p.m. Monday - Friday. Please note that voicemails left after 4:00 p.m. may not be returned until the following business day.  We are closed weekends and major holidays. You have access to a nurse at all times for urgent questions. Please call the main number to the clinic [phone] and follow the prompts.  For any non-urgent questions, you may also contact your provider using MyChart. We now offer e-Visits for anyone 54 and older to request care online for non-urgent symptoms. For details visit [email or web address].   Also download the MyChart app! Go to the app store, search "MyChart", open the app, select [place], and log in with your MyChart username and password.

## 2024-04-10 ENCOUNTER — Telehealth: Payer: Self-pay | Admitting: *Deleted

## 2024-04-10 LAB — MULTIPLE MYELOMA PANEL, SERUM
Albumin SerPl Elph-Mcnc: 3.6 g/dL (ref 2.9–4.4)
Albumin/Glob SerPl: 1.7 (ref 0.7–1.7)
Alpha 1: 0.2 g/dL (ref 0.0–0.4)
Alpha2 Glob SerPl Elph-Mcnc: 0.7 g/dL (ref 0.4–1.0)
B-Globulin SerPl Elph-Mcnc: 0.9 g/dL (ref 0.7–1.3)
Gamma Glob SerPl Elph-Mcnc: 0.4 g/dL (ref 0.4–1.8)
Globulin, Total: 2.2 g/dL (ref 2.2–3.9)
IgA: 14 mg/dL — ABNORMAL LOW (ref 61–437)
IgG (Immunoglobin G), Serum: 481 mg/dL — ABNORMAL LOW (ref 603–1613)
IgM (Immunoglobulin M), Srm: 5 mg/dL — ABNORMAL LOW (ref 15–143)
Total Protein ELP: 5.8 g/dL — ABNORMAL LOW (ref 6.0–8.5)

## 2024-04-10 LAB — KAPPA/LAMBDA LIGHT CHAINS
Kappa free light chain: 8 mg/L (ref 3.3–19.4)
Kappa, lambda light chain ratio: 2 — ABNORMAL HIGH (ref 0.26–1.65)
Lambda free light chains: 4 mg/L — ABNORMAL LOW (ref 5.7–26.3)

## 2024-04-10 NOTE — Telephone Encounter (Addendum)
 Spoke to patient's wife-- informed patient does not need to get anymore lab work - he had labs done 04/06/24 at cancer center  CMP, CBC Na+138 K+ 4.0 BUN 30  GFR 59   Hgb 12.7  Platelet 147  Wife voiced understanding and states she will informed patient.

## 2024-04-13 ENCOUNTER — Telehealth: Payer: Self-pay | Admitting: *Deleted

## 2024-04-13 NOTE — Telephone Encounter (Signed)
 Cardiac Catheterization scheduled at [facility] for: Monday April 17, 2024 9 AM Arrival time [facility] Main Entrance A at: 7 AM  Nothing to eat after midnight prior to procedure, clear liquids until 5 AM day of procedure.  Medication instructions: -Hold:  Lasix -AM of procedure-pt takes prn -Other usual morning medications can be taken with sips of water including aspirin  81 mg and Plavix  75 mg  Plan to go home the same day, you will only stay overnight if medically necessary.  You must have responsible adult to drive you home.  Someone must be with you the first 24 hours after you arrive home.  Reviewed procedure instructions with patient.

## 2024-04-17 ENCOUNTER — Other Ambulatory Visit: Payer: Self-pay

## 2024-04-17 ENCOUNTER — Ambulatory Visit (HOSPITAL_COMMUNITY)
Admission: RE | Admit: 2024-04-17 | Discharge: 2024-04-17 | Disposition: A | Discharge status: Home / Self Care | Attending: Cardiology | Admitting: Cardiology

## 2024-04-17 ENCOUNTER — Encounter (HOSPITAL_COMMUNITY)
Admission: RE | Disposition: A | Discharge status: Home / Self Care | Payer: Self-pay | Source: Home / Self Care | Attending: Cardiology

## 2024-04-17 DIAGNOSIS — I252 Old myocardial infarction: Secondary | ICD-10-CM | POA: Insufficient documentation

## 2024-04-17 DIAGNOSIS — E785 Hyperlipidemia, unspecified: Secondary | ICD-10-CM | POA: Insufficient documentation

## 2024-04-17 DIAGNOSIS — Z79899 Other long term (current) drug therapy: Secondary | ICD-10-CM | POA: Insufficient documentation

## 2024-04-17 DIAGNOSIS — I2510 Atherosclerotic heart disease of native coronary artery without angina pectoris: Secondary | ICD-10-CM | POA: Diagnosis not present

## 2024-04-17 DIAGNOSIS — I25110 Atherosclerotic heart disease of native coronary artery with unstable angina pectoris: Secondary | ICD-10-CM | POA: Diagnosis present

## 2024-04-17 DIAGNOSIS — N189 Chronic kidney disease, unspecified: Secondary | ICD-10-CM | POA: Diagnosis not present

## 2024-04-17 DIAGNOSIS — Z7902 Long term (current) use of antithrombotics/antiplatelets: Secondary | ICD-10-CM | POA: Insufficient documentation

## 2024-04-17 DIAGNOSIS — Z7982 Long term (current) use of aspirin: Secondary | ICD-10-CM | POA: Diagnosis not present

## 2024-04-17 DIAGNOSIS — R079 Chest pain, unspecified: Secondary | ICD-10-CM | POA: Diagnosis present

## 2024-04-17 DIAGNOSIS — Z955 Presence of coronary angioplasty implant and graft: Secondary | ICD-10-CM | POA: Diagnosis not present

## 2024-04-17 DIAGNOSIS — I472 Ventricular tachycardia, unspecified: Secondary | ICD-10-CM | POA: Diagnosis not present

## 2024-04-17 DIAGNOSIS — C9000 Multiple myeloma not having achieved remission: Secondary | ICD-10-CM | POA: Insufficient documentation

## 2024-04-17 DIAGNOSIS — I129 Hypertensive chronic kidney disease with stage 1 through stage 4 chronic kidney disease, or unspecified chronic kidney disease: Secondary | ICD-10-CM | POA: Insufficient documentation

## 2024-04-17 DIAGNOSIS — R0609 Other forms of dyspnea: Secondary | ICD-10-CM

## 2024-04-17 DIAGNOSIS — E1122 Type 2 diabetes mellitus with diabetic chronic kidney disease: Secondary | ICD-10-CM | POA: Diagnosis not present

## 2024-04-17 DIAGNOSIS — R0602 Shortness of breath: Secondary | ICD-10-CM

## 2024-04-17 HISTORY — PX: CORONARY BALLOON ANGIOPLASTY: CATH118233

## 2024-04-17 HISTORY — PX: LEFT HEART CATH AND CORONARY ANGIOGRAPHY: CATH118249

## 2024-04-17 LAB — POCT ACTIVATED CLOTTING TIME
Activated Clotting Time: 262 s
Activated Clotting Time: 273 s

## 2024-04-17 SURGERY — LEFT HEART CATH AND CORONARY ANGIOGRAPHY
Anesthesia: LOCAL

## 2024-04-17 MED ORDER — SODIUM CHLORIDE 0.9 % WEIGHT BASED INFUSION
3.0000 mL/kg/h | INTRAVENOUS | Status: AC
  Administered 2024-04-17: 3 mL/kg/h via INTRAVENOUS

## 2024-04-17 MED ORDER — FENTANYL CITRATE (PF) 100 MCG/2ML IJ SOLN
INTRAMUSCULAR | Status: DC | PRN
  Administered 2024-04-17: 25 ug via INTRAVENOUS

## 2024-04-17 MED ORDER — LABETALOL HCL 5 MG/ML IV SOLN: 10.0000 mg | INTRAVENOUS | Status: DC | PRN

## 2024-04-17 MED ORDER — NITROGLYCERIN 0.4 MG SL SUBL: 0.4000 mg | SUBLINGUAL_TABLET | SUBLINGUAL | 2 refills | Status: AC | PRN

## 2024-04-17 MED ORDER — SODIUM CHLORIDE 0.9% FLUSH: 3.0000 mL | Freq: Two times a day (BID) | INTRAVENOUS | Status: DC

## 2024-04-17 MED ORDER — VERAPAMIL HCL 2.5 MG/ML IV SOLN
INTRAVENOUS | Status: AC
  Filled 2024-04-17: qty 2

## 2024-04-17 MED ORDER — SODIUM CHLORIDE 0.9% FLUSH: 3.0000 mL | INTRAVENOUS | Status: DC | PRN

## 2024-04-17 MED ORDER — ACETAMINOPHEN 325 MG PO TABS
650.0000 mg | ORAL_TABLET | ORAL | Status: DC | PRN
Start: 2024-04-17 — End: 2024-04-17

## 2024-04-17 MED ORDER — MIDAZOLAM HCL 2 MG/2ML IJ SOLN
INTRAMUSCULAR | Status: AC
  Filled 2024-04-17: qty 2

## 2024-04-17 MED ORDER — HEPARIN (PORCINE) IN NACL 2000-0.9 UNIT/L-% IV SOLN
INTRAVENOUS | Status: DC | PRN
  Administered 2024-04-17: 1000 mL

## 2024-04-17 MED ORDER — LIDOCAINE HCL (PF) 1 % IJ SOLN
INTRAMUSCULAR | Status: AC
  Filled 2024-04-17: qty 30

## 2024-04-17 MED ORDER — VERAPAMIL HCL 2.5 MG/ML IV SOLN
INTRAVENOUS | Status: DC | PRN
  Administered 2024-04-17: 10 mL via INTRA_ARTERIAL

## 2024-04-17 MED ORDER — ONDANSETRON HCL 4 MG/2ML IJ SOLN: 4.0000 mg | Freq: Four times a day (QID) | INTRAMUSCULAR | Status: DC | PRN

## 2024-04-17 MED ORDER — HEPARIN SODIUM (PORCINE) 1000 UNIT/ML IJ SOLN
INTRAMUSCULAR | Status: DC | PRN
  Administered 2024-04-17 (×2): 4500 [IU] via INTRAVENOUS
  Administered 2024-04-17: 2000 [IU] via INTRAVENOUS

## 2024-04-17 MED ORDER — IOHEXOL 350 MG/ML SOLN
INTRAVENOUS | Status: DC | PRN
Start: 2024-04-17 — End: 2024-04-17
  Administered 2024-04-17: 70 mL

## 2024-04-17 MED ORDER — SODIUM CHLORIDE 0.9 % IV SOLN: 250.0000 mL | INTRAVENOUS | Status: DC | PRN

## 2024-04-17 MED ORDER — FENTANYL CITRATE (PF) 100 MCG/2ML IJ SOLN
INTRAMUSCULAR | Status: AC
  Filled 2024-04-17: qty 2

## 2024-04-17 MED ORDER — HYDRALAZINE HCL 20 MG/ML IJ SOLN: 10.0000 mg | INTRAMUSCULAR | Status: DC | PRN

## 2024-04-17 MED ORDER — SODIUM CHLORIDE 0.9 % WEIGHT BASED INFUSION: 1.0000 mL/kg/h | INTRAVENOUS | Status: DC

## 2024-04-17 MED ORDER — LIDOCAINE HCL (PF) 1 % IJ SOLN
INTRAMUSCULAR | Status: DC | PRN
  Administered 2024-04-17: 2 mL

## 2024-04-17 MED ORDER — SODIUM CHLORIDE 0.9 % IV SOLN: INTRAVENOUS | Status: AC

## 2024-04-17 MED ORDER — MIDAZOLAM HCL 2 MG/2ML IJ SOLN
INTRAMUSCULAR | Status: DC | PRN
Start: 2024-04-17 — End: 2024-04-17
  Administered 2024-04-17: 1 mg via INTRAVENOUS

## 2024-04-17 MED ORDER — HEPARIN SODIUM (PORCINE) 1000 UNIT/ML IJ SOLN
INTRAMUSCULAR | Status: AC
  Filled 2024-04-17: qty 10

## 2024-04-17 SURGICAL SUPPLY — 14 items
BALLOON SAPPHIRE NC24 3.5X12 (BALLOONS) IMPLANT
BALLOON SAPPHIRE NC24 4.0X8 (BALLOONS) IMPLANT
BALLOON SCOREFLEX 3.50X10 (BALLOONS) IMPLANT
BALLOON SCOREFLEX 4.0X10 (BALLOONS) IMPLANT
CATH INFINITI AMBI 5FR TG (CATHETERS) IMPLANT
CATH VISTA GUIDE 6FR JR4 ECOPK (CATHETERS) IMPLANT
DEVICE RAD COMP TR BAND LRG (VASCULAR PRODUCTS) IMPLANT
GLIDESHEATH SLEND SS 6F .021 (SHEATH) IMPLANT
GUIDEWIRE INQWIRE 1.5J.035X260 (WIRE) IMPLANT
KIT ENCORE 26 ADVANTAGE (KITS) IMPLANT
PACK CARDIAC CATHETERIZATION (CUSTOM PROCEDURE TRAY) ×2 IMPLANT
SET ATX-X65L (MISCELLANEOUS) IMPLANT
WIRE ASAHI PROWATER 180CM (WIRE) IMPLANT
WIRE RUNTHROUGH .014X180CM (WIRE) IMPLANT

## 2024-04-17 NOTE — H&P (Signed)
 Cardiology Office Note:  .   Date:  04/17/2024  ID:  Alexander Armstrong, DOB 1941-07-02, MRN [id number] PCP: Katrinka Garnette KIDD, MD  [facility] Providers Cardiologist:  Alm Clay, MD Electrophysiologist:  Will Gladis Norton, MD     No chief complaint on file.   Patient Profile: .     Alexander Armstrong is a  83 y.o. male  with a PMH noted below who presents here for earlier than usual follow-up to assess exertional dyspnea and chest pain at the request of No ref. provider found.  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 Two-vessel CAD-PCI to LAD and RCA back in 2006 for anterior STEMI LAD PCI with proximal upstream overlapping DES (Synergy XD 3.0 x 12 mm postdilated to 3.3 mm.  With scoring balloon PTCA of ISR) (April 2018) Most recent Myoview  was July 2020.  EF 55 to 65%.  Small sized mild severity fixed mid anteroseptal to apical distribution suggestive of prior infarct with no ischemia.  LOW RISK Paroxysmal VT dating back 2006 treated with flecainide for long period time, then discontinued.  Intolerant of amiodarone , mexiletine and even beta-blockers.  Now on low-dose diltiazem      [name] was last seen on July 20, 2023 for evaluation of fleeting chest discomfort around the loop recorder site.  Also some brief episodes of lightheaded and dizziness maybe 3-4 times a week.  He noticed that the symptoms seem to have occurred after having stopped his diltiazem . => We restarted 120 mg diltiazem  XT, but mentioned low threshold to consider stress test.  Encouraged adequate hydration and Zio patch monitor check.  Subjective  Discussed the use of AI scribe software for clinical note transcription with the patient, who gave verbal consent to proceed.  History of Present Illness History of Present  Illness Alexander Armstrong is an 83 year old male with coronary artery disease and multiple myeloma who presents with chest pain and fatigue.  He experiences chest pain that is not severe but occurs with exertion, described as tight and slightly hurting, accompanied by breathlessness with minimal exertion. He has a history of a heart attack 20 years ago and notes a recent drop in blood pressure to 87/67, lower than his usual 115/70. New swelling in his legs is also reported. No significant chest tightness or pressure at rest, only with exertion. No waking up at night unable to breathe or shortness of breath when lying down.  His glucose levels have been erratic, with a recent reading of 345 mg/dL in the morning after chemotherapy. He attributes fluctuations to steroid pills taken as part of his treatment, causing sugar levels to rise and take two to three days to normalize. He is not on any medication for glucose levels but monitors them regularly, noting an average of 140 mg/dL. His A1c was recently measured at 7.7%. He reports frequent urination, especially at night, occurring every two hours, which he associates with elevated glucose levels.  He is undergoing treatment for multiple myeloma with Velcade  infusions every other week. His blood counts have been slightly anemic since diagnosis, with platelets just under normal. He experiences leg cramps and weakness and has been off statins for two weeks without significant improvement in symptoms.  He mentions a weight loss of about five pounds since his last visit.     Objective   Current  cardiac meds: Plavix  75 Miller daily, diltiazem  ER 100 mL daily, rosuvastatin  20 mg daily  Studies Reviewed: SABRA         LABS    Latest Ref Rng & Units 04/06/2024    9:42 AM 03/23/2024    8:56 AM 03/09/2024    9:20 AM  CBC  WBC 4.0 - 10.5 K/uL 9.3  9.1  7.6   Hemoglobin 13.0 - 17.0 g/dL 87.9  87.6  87.4   Hematocrit 39.0 - 52.0 % 35.8  36.5  36.8   Platelets 150 -  400 K/uL 147  167  147    Lab Results  Component Value Date   NA 138 04/06/2024   K 4.0 04/06/2024   CREATININE 1.22 04/06/2024   GFRNONAA 59 (L) 04/06/2024   GLUCOSE 178 (H) 04/06/2024   Lab Results  Component Value Date   HGBA1C 7.7 (A) 02/17/2024   Lab Results  Component Value Date   CHOL 142 12/23/2022   HDL 41.60 12/23/2022   LDLCALC 78 12/23/2022   TRIG 116.0 12/23/2022   CHOLHDL 3 12/23/2022   DIAGNOSTIC Nuclear stress test: No ischemia, old infarction in LAD territory (04/2019)  14-day Zio patch monitor (10/19-11/11/2022)    Predominant rhythm is sinus rhythm with a rate range of 52 to 129 bpm, average of 80 bpm; first-degree AV block noted.    Occasional PVCs-premature ventricular contractions (2.8%) with some rare couplets, bigeminy and trigeminy.    Rare(<1%) PACs-premature atrial contractions.    5 Atrial Runs noted: Fastest was only 4 beats at 1.6 second,  Heart rate range 118-141 bpm and an average of 131 bpm.  Longest was 8 beats (5.2 seconds) rate ranges 92 to 108 bpm and average of 100 bpm.    No Sustained Arrhythmias: Atrial Tachycardia (AT), Supraventricular Tachycardia (SVT), Atrial Fibrillation (A-Fib), Atrial Flutter (A-Flutter), Sustained Ventricular Tachycardia (VT)    No significant bradycardia, sinus pauses or high-grade AV block noted.    Overall very reassuring monitor results.  No gross abnormalities noted.  Symptoms were noted with PVCs but these were still not that frequent at 2.8%.   Risk Assessment/Calculations:              Physical Exam:   VS:  BP 112/67   Pulse 62   Temp (!) 97.5 F (36.4 C) (Oral)   Resp 18   Ht 5' 11 (1.803 m)   Wt 90.7 kg   SpO2 97%   BMI 27.89 kg/m    Wt Readings from Last 3 Encounters:  04/17/24 90.7 kg  04/06/24 89 kg  03/28/24 89.4 kg  BP is somewhat low today, and has been in the past but is usually 120s at home  GEN: Well nourished, well groomed in no acute distress; mild truncal obesity but  otherwise healthy appearing NECK: No JVD; No carotid bruits CARDIAC:  RRR, with occasional ectopy.  Normal S1 and S2;no murmurs, rubs, gallops RESPIRATORY:  Clear to auscultation without rales, wheezing or rhonchi ; nonlabored, good air movement. ABDOMEN: Soft, non-tender, non-distended EXTREMITIES: Trace ankle edema edema; No deformity      ASSESSMENT AND PLAN: .    Problem List Items Addressed This Visit       Cardiology Problems   Coronary artery disease involving native coronary artery of native heart without angina pectoris (Chronic)   Relevant Medications   0.9% sodium chloride  infusion   0.9% sodium chloride  infusion (Start on 04/17/2024  8:15 AM)   Other Visit Diagnoses  DOE (dyspnea on exertion)       Relevant Medications   0.9% sodium chloride  infusion   0.9% sodium chloride  infusion (Start on 04/17/2024  8:15 AM)     SOB (shortness of breath) on exertion       Relevant Medications   0.9% sodium chloride  infusion   0.9% sodium chloride  infusion (Start on 04/17/2024  8:15 AM)      \    Informed Consent   Shared Decision Making/Informed Consent The risks [chest pain, shortness of breath, cardiac arrhythmias, dizziness, blood pressure fluctuations, myocardial infarction, stroke/transient ischemic attack, nausea, vomiting, allergic reaction, radiation exposure, metallic taste sensation and life-threatening complications (estimated to be 1 in 10,000)], benefits (risk stratification, diagnosing coronary artery disease, treatment guidance) and alternatives of a nuclear stress test were discussed in detail with Mr. Cruces and he agrees to proceed.       ADDENDUM - 03/30/2024   I reviewed the results of the nuclear stress test with Mr. Hermann.  He continues to have exertional dyspnea beyond what he usually would have.  We discussed the findings showing that in addition to the previously documented distal anteroapical defect, if there is a new inferior defect which appears to  be reversible.     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  It is possible that there is progression of disease in the RCA versus to simply evidence of his prior event involving the RCA as well.   Based on the fact that he is still having exertional dyspnea we decided the best course of action would be to proceed with cardiac catheterization and possible PCI.  He continues to have intermittent chest discomfort,but more DOE than CP.  No PND/orthopnea or edema.  No syncope or near syncope/TIA/Amaurosis fugax.    We will plan to schedule this for July 14.   Informed Consent  Shared Decision Making/Informed Consent The risks [stroke (1 in 1000), death (1 in 1000), kidney failure [usually temporary] (1 in 500), bleeding (1 in 200), allergic reaction [possibly serious] (1 in 200)], benefits (diagnostic support and management of coronary artery disease) and alternatives of a cardiac catheterization were discussed in detail with Mr. Pariseau and he is willing to proceed.          Alm Clay, MD        Signed, Alm MICAEL Clay, MD, MS Alm Clay, M.D., M.S. Interventional Chartered certified accountant  Pager # [phone]

## 2024-04-17 NOTE — Progress Notes (Signed)
 Discussed with pt restrictions, exercise, NTG and CRPII. Pt receptive. Enjoys working a more strenuous job daily. Still encouraged aerobic exercise. Will refer to [facility]  [phone] Aliene Aris BS, ACSM-CEP 04/17/2024 1:34 PM

## 2024-04-17 NOTE — Discharge Summary (Signed)
 Discharge Summary for Same Day PCI   Patient ID: Mayfield Schoene MRN: [id number]; DOB: April 12, 1941  Admit date: 04/17/2024 Discharge date: 04/17/2024  Primary Care Provider: Katrinka Garnette KIDD, MD  Primary Cardiologist: Alm Clay, MD  Primary Electrophysiologist:  Soyla Gladis Norton, MD   Discharge Diagnoses    Active Problems:   Chest pain   Diagnostic Studies/Procedures    Cardiac Catheterization 04/17/2024:    CULPRIT LESION: Previously placed Prox RCA lesion is 70% stenosed.   Scoring balloon angioplasty was performed using a BALLOON SCOREFLEX 4.0X10 reducing focal stenosis from 70% to 10% despite high pressure inflation.  The remainder the stent was fully expanded.  Vessel is greater than 4.3 mm proximally and close to 4.1 mm distally.  The original stent was 3.5 mm.   Prox RCA to Mid RCA lesion is 35% stenosed.   Previously placed Prox LAD to Mid LAD overlapping Drug-Coated Stents are widely patent.   -----------------------------------   1st Diag lesion is 50% stenosed.   -----------------------------------   LV end diastolic pressure is normal.   Diagnostic    Dominance: Right                                                                        Intervention    Two-vessel CAD with widely patent LAD stents but culprit lesion is focal ISR of the proximal RCA stent roughly 70% treated successfully with score flex and [place] balloon PTCA reducing the 70% lesion to 10%.      RECOMMENDATIONS   In the absence of any other complications or medical issues, we expect the patient to be ready for discharge from an interventional cardiology perspective on 04/17/2024.   Will need to arrange close follow-up with the Dr. Clay or APP   Recommend uninterrupted dual antiplatelet therapy with Aspirin  81mg  daily and Clopidogrel  75mg  daily for a minimum of 6 months (stable ischemic heart disease-Class I recommendation).   Will return to Plavix  monotherapy after 6 months   Alm Clay,  MD   _____________   History of Present Illness     Alexander Armstrong is a 83 y.o. 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 He has a history of a heart attack 20 years ago and notes a recent drop in blood pressure to 87/67, lower than his usual 115/70. New swelling in his legs was also reported. No significant chest tightness or pressure at rest, only with exertion. No waking up at night unable to breathe or shortness of breath when lying down. He was undergoing treatment for multiple myeloma with Velcade  infusions every other week. His blood counts had been slightly anemic since diagnosis, with platelets just under normal. He experienced leg cramps and weakness and has been off statins for two weeks without significant improvement in symptoms. It was recommended that he undergo outpatient stress test which was abnormal showing small area of the mid inferior wall that appears to have moderate perfusion defect, partially reversible to mild at rest.  This suggested one segment of peri-infarct ischemia. Given this finding he was set up for outpatient cardiac cath.    Hospital Course     The patient underwent cardiac cath as noted above with 2v CAD, widely patent LAD stents, but culprit lesion of ISR of pRCA of 70% treated with [place] balloon PTCA. Plan for DAPT with ASA/plavix  for at least 6 months. The patient was seen by cardiac rehab while in short stay. There were no observed complications post cath. Radial cath site was re-evaluated prior to discharge and found to be stable without any complications. Instructions/precautions regarding cath site care were given prior to discharge.  [name] was seen by Dr. Anner and determined  stable for discharge home. Follow up with our office has been arranged. Medications are listed below. Pertinent changes include n/a.  _____________  Cath/PCI Registry Performance & Quality Measures: Aspirin  prescribed? - Yes ADP Receptor Inhibitor (Plavix /Clopidogrel , Brilinta/Ticagrelor or Effient/Prasugrel) prescribed (includes medically managed patients)? - Yes High Intensity Statin (Lipitor 40-80mg  or Crestor  20-40mg ) prescribed? - Yes For EF <40%, was ACEI/ARB prescribed? - Not Applicable (EF >/= 40%) For EF <40%, Aldosterone Antagonist (Spironolactone or Eplerenone) prescribed? - Not Applicable (EF >/= 40%) Cardiac Rehab Phase II ordered (Included Medically managed Patients)? - Yes  _____________   Discharge Vitals Blood pressure 118/70, pulse (!) 51, temperature (!) 97.5 F (36.4 C), temperature source Oral, resp. rate 16, height 5' 11 (1.803 m), weight 90.7 kg, SpO2 98%.  Filed Weights   04/17/24 0704  Weight: 90.7 kg    Last Labs & Radiologic Studies    CBC No results for input(s): WBC, NEUTROABS, HGB, HCT, MCV, PLT in the last 72 hours. Basic Metabolic Panel No results for input(s): NA, K, CL, CO2, GLUCOSE, BUN, CREATININE, CALCIUM , MG, PHOS in the last 72 hours. Liver Function Tests No results for input(s): AST, ALT, ALKPHOS, BILITOT, PROT, ALBUMIN in the last 72 hours. No results for input(s): LIPASE, AMYLASE in the last 72 hours. High Sensitivity Troponin:   No results for input(s): TROPONINIHS in the last 720 hours.  BNP Invalid input(s): POCBNP D-Dimer No results for input(s): DDIMER in the last 72 hours. Hemoglobin A1C No results for input(s): HGBA1C in the last 72 hours. Fasting Lipid Panel No results for input(s): CHOL, HDL, LDLCALC, TRIG, CHOLHDL, LDLDIRECT in the last 72 hours. Thyroid Function Tests No results for input(s): TSH, T4TOTAL, T3FREE, THYROIDAB in the last 72  hours.  Invalid input(s): FREET3 _____________  CARDIAC CATHETERIZATION Result Date: 04/17/2024 Images from the original result were not included.   CULPRIT LESION: Previously placed Prox RCA lesion is 70% stenosed.   Scoring balloon angioplasty was performed using a BALLOON SCOREFLEX 4.0X10 reducing focal stenosis from 70% to 10% despite high pressure inflation.  The remainder the stent was fully expanded.  Vessel is greater than 4.3 mm proximally and close to 4.1 mm distally.  The original stent was 3.5 mm.   Prox RCA to Mid RCA lesion is 35% stenosed.   Previously placed Prox LAD to Mid LAD overlapping Drug-Coated Stents are widely patent.   -----------------------------------   1st Diag lesion is 50% stenosed.   -----------------------------------   LV end diastolic pressure is normal. Diagnostic Dominance: Right       Intervention Two-vessel CAD with widely patent LAD stents but culprit lesion is focal ISR of the proximal RCA stent roughly 70% treated successfully with score flex and [place] balloon PTCA reducing the 70% lesion to 10%. RECOMMENDATIONS   In the absence  of any other complications or medical issues, we expect the patient to be ready for discharge from an interventional cardiology perspective on 04/17/2024.   Will need to arrange close follow-up with the Dr. Anner or APP   Recommend uninterrupted dual antiplatelet therapy with Aspirin  81mg  daily and Clopidogrel  75mg  daily for a minimum of 6 months (stable ischemic heart disease-Class I recommendation).   Will return to Plavix  monotherapy after 6 months Alm Anner, MD  MYOCARDIAL PERFUSION/CT RAD READ Result Date: 04/01/2024 CLINICAL DATA:  This over-read does not include interpretation of cardiac or coronary anatomy or pathology. The cardiac SPECT CT interpretation by the cardiologist is attached. COMPARISON:  PET-CT January 20, 2024 FINDINGS: Vascular: Aortic atherosclerosis. Three-vessel coronary artery calcifications. 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 Lungs/Pleura: Hypoventilatory change in the dependent lungs. Scattered atelectasis/scarring. Upper Abdomen: No acute abnormality. Musculoskeletal: Multilevel degenerative changes spine. Cardiac monitoring device in the left anterior chest. IMPRESSION: 1. 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 2. Aortic atherosclerosis. Aortic Atherosclerosis (ICD10-I70.0). Electronically Signed   By: Reyes Holder M.D.   On: 04/01/2024 10:23   MYOCARDIAL PERFUSION IMAGING Result Date: 03/28/2024   Findings are consistent with possible prior inferior infarction with peri-infarct ischemia. Challenging study to interpret due to image quality. There is an small area of the mid inferior wall that appears to have moderate perfusion defect, partially reversible to mild at rest. This suggests one segment of peri-infarct ischemia. Second defect in the anterior wall appears fixed and likely represents prior anterior infarct. The study is intermediate risk.   No ST deviation was noted.   LV perfusion is abnormal. Defect 1: There is a medium defect with moderate reduction in uptake present in the apical to basal inferior location(s) that is partially reversible. There is abnormal wall motion in the defect area. Consistent with infarction and peri-infarct ischemia. Defect 2: There is a small defect with moderate reduction in uptake present in the mid to basal anterior location(s) that is fixed. There is abnormal wall motion in the defect area. Consistent with infarction.   Left ventricular function is normal. Nuclear stress EF: 73%. The left ventricular ejection fraction is hyperdynamic (>65%). End diastolic cavity size is normal. End systolic cavity size is normal. No evidence of transient ischemic dilation (TID) noted.   CT images were obtained for attenuation correction and were examined for the presence of coronary calcium  when appropriate.   Coronary calcium  assessment not performed  due to prior revascularization.   Prior study available for comparison from 04/05/2019. There are changes compared to prior study which appear to be new.    Disposition   Pt is being discharged home today in good condition.  Follow-up Plans & Appointments     Discharge Instructions     Amb Referral to Cardiac Rehabilitation   Complete by: As directed    Diagnosis:  Coronary Stents PTCA     After initial evaluation and assessments completed: Virtual Based Care may be provided alone or in conjunction with Phase 2 Cardiac Rehab based on patient barriers.: Yes   Intensive Cardiac Rehabilitation (ICR) [facility] location only OR Traditional Cardiac Rehabilitation (TCR) *If criteria for ICR are not met will enroll in TCR [facility] only): Yes        Discharge Medications   Allergies as of 04/17/2024       Reactions   Latex Other (See Comments)   takes the skin off (tape)        Medication List  TAKE these medications    acetaminophen  500 MG tablet Commonly known as: TYLENOL  Take 500 mg by mouth 3 (three) times daily as needed for moderate pain (pain score 4-6) or headache.   aspirin  EC 81 MG tablet Take 81 mg by mouth daily. Swallow whole.   Benadryl  Allergy 25 MG tablet Generic drug: diphenhydrAMINE  Take 50 mg by mouth See admin instructions. Take 50 mg by mouth one hour prior to Daratumumab  infusion on Mondays   Blood Glucose Monitoring Suppl Devi 1 each by Does not apply route in the morning, at noon, and at bedtime. May substitute to any manufacturer covered by patient's insurance.   clopidogrel  75 MG tablet Commonly known as: PLAVIX  Take 1 tablet (75 mg total) by mouth daily. TAKE 1 TABLET(75 MG) BY MOUTH DAILY   cyanocobalamin  1000 MCG tablet Commonly known as: VITAMIN B12 Take 1,000 mcg by mouth daily.   dexamethasone  4 MG tablet Commonly known as: DECADRON  TAKE 5 TABS (20 MG) WEEKLY THE DAY OF DARATUMUMAB . TAKE WITH BREAKFAST.   diltiazem  120 MG 24 hr  capsule Commonly known as: Tiadylt  ER Take 1 capsule (120 mg total) by mouth daily.   DSS 100 MG Caps Take 100 mg by mouth daily.   famciclovir  250 MG tablet Commonly known as: FAMVIR  TAKE 1 TABLET BY MOUTH EVERY DAY   fluticasone 50 MCG/ACT nasal spray Commonly known as: FLONASE Place 1 spray into both nostrils daily as needed for allergies or rhinitis.   furosemide  20 MG tablet Commonly known as: Lasix  Take 1 tablet (20 mg total) by mouth daily as needed.   loratadine 10 MG tablet Commonly known as: CLARITIN Take 10 mg by mouth in the morning.   MAGNESIUM PO Take 1 tablet by mouth daily.   meclizine  25 MG tablet Commonly known as: ANTIVERT  Take 1 tablet (25 mg total) by mouth 3 (three) times daily as needed for dizziness.   montelukast  10 MG tablet Commonly known as: SINGULAIR  Take 10 mg by mouth at bedtime.   nitroGLYCERIN  0.4 MG SL tablet Commonly known as: Nitrostat  Place 1 tablet (0.4 mg total) under the tongue every 5 (five) minutes as needed.   pantoprazole  40 MG tablet Commonly known as: PROTONIX  TAKE 1 TABLET BY MOUTH EVERY DAY   polyethylene glycol 17 g packet Commonly known as: MIRALAX  / GLYCOLAX  Take 17 g by mouth daily as needed for moderate constipation.   Refresh Plus 0.5 % Soln Generic drug: Carboxymethylcellulose Sod PF Place 1 drop into both eyes 3 (three) times daily as needed (for dryness).   rosuvastatin  20 MG tablet Commonly known as: CRESTOR  TAKE 1 TABLET BY MOUTH EVERY DAY   tamsulosin  0.4 MG Caps capsule Commonly known as: FLOMAX  TAKE 1 CAPSULE BY MOUTH EVERYDAY AT BEDTIME   Vitamin D3 25 MCG (1000 UT) Caps Take 1,000 Units by mouth at bedtime.           Allergies Allergies  Allergen Reactions   Latex Other (See Comments)    takes the skin off (tape)    Outstanding Labs/Studies   N/a   Duration of Discharge Encounter   Greater than 30 minutes including physician time.  Signed, Manuelita Rummer, NP 04/17/2024,  2:23 PM

## 2024-04-17 NOTE — Progress Notes (Signed)
 Right radial dressing changed. No further bleeding noted. IVs removed. D/C instruction were previously given per Andrea, RN. Instructions confirmed. Patient transported via wheelchair to main lobby for d/c home.Alexander Armstrong

## 2024-04-18 ENCOUNTER — Encounter (HOSPITAL_COMMUNITY): Payer: Self-pay | Admitting: Cardiology

## 2024-04-19 ENCOUNTER — Telehealth: Payer: Self-pay | Admitting: *Deleted

## 2024-04-19 NOTE — Telephone Encounter (Signed)
-----   Message from Alm Clay sent at 04/19/2024  3:12 PM EDT ----- Regarding: RE: Cath 7/14-needs follow up appt. I think Reena was working on this as well.  Alm Clay, MD ----- Message ----- From: Lajoyce Arlean HERO, RN Sent: 04/18/2024   9:11 AM EDT To: Alm LELON Clay, MD; Reena Gladis GAILS, RN; An# Subject: Cath 7/14-needs follow up appt.                Please call to arrange follow up appt in 10-14 days with Dr Harding/APP.  Thanks, Arlean

## 2024-04-19 NOTE — Telephone Encounter (Signed)
 Spoke to patient  appt given for 05/05/24.  Patient verbalized understanding.

## 2024-04-20 ENCOUNTER — Inpatient Hospital Stay

## 2024-04-20 VITALS — BP 105/57 | HR 78 | Temp 97.5°F | Resp 20

## 2024-04-20 DIAGNOSIS — C9000 Multiple myeloma not having achieved remission: Secondary | ICD-10-CM

## 2024-04-20 DIAGNOSIS — Z5112 Encounter for antineoplastic immunotherapy: Secondary | ICD-10-CM | POA: Diagnosis not present

## 2024-04-20 LAB — CBC WITH DIFFERENTIAL (CANCER CENTER ONLY)
Abs Immature Granulocytes: 0.03 K/uL (ref 0.00–0.07)
Basophils Absolute: 0.1 K/uL (ref 0.0–0.1)
Basophils Relative: 1 %
Eosinophils Absolute: 0.4 K/uL (ref 0.0–0.5)
Eosinophils Relative: 4 %
HCT: 35.6 % — ABNORMAL LOW (ref 39.0–52.0)
Hemoglobin: 12.2 g/dL — ABNORMAL LOW (ref 13.0–17.0)
Immature Granulocytes: 0 %
Lymphocytes Relative: 21 %
Lymphs Abs: 1.9 K/uL (ref 0.7–4.0)
MCH: 32.6 pg (ref 26.0–34.0)
MCHC: 34.3 g/dL (ref 30.0–36.0)
MCV: 95.2 fL (ref 80.0–100.0)
Monocytes Absolute: 0.8 K/uL (ref 0.1–1.0)
Monocytes Relative: 9 %
Neutro Abs: 5.8 K/uL (ref 1.7–7.7)
Neutrophils Relative %: 65 %
Platelet Count: 156 K/uL (ref 150–400)
RBC: 3.74 MIL/uL — ABNORMAL LOW (ref 4.22–5.81)
RDW: 12.6 % (ref 11.5–15.5)
WBC Count: 9 K/uL (ref 4.0–10.5)
nRBC: 0 % (ref 0.0–0.2)

## 2024-04-20 LAB — CMP (CANCER CENTER ONLY)
ALT: 11 U/L (ref 0–44)
AST: 14 U/L — ABNORMAL LOW (ref 15–41)
Albumin: 4.2 g/dL (ref 3.5–5.0)
Alkaline Phosphatase: 61 U/L (ref 38–126)
Anion gap: 8 (ref 5–15)
BUN: 25 mg/dL — ABNORMAL HIGH (ref 8–23)
CO2: 25 mmol/L (ref 22–32)
Calcium: 9.5 mg/dL (ref 8.9–10.3)
Chloride: 107 mmol/L (ref 98–111)
Creatinine: 1.24 mg/dL (ref 0.61–1.24)
GFR, Estimated: 58 mL/min — ABNORMAL LOW (ref >60)
Glucose, Bld: 205 mg/dL — ABNORMAL HIGH (ref 70–99)
Potassium: 4.1 mmol/L (ref 3.5–5.1)
Sodium: 140 mmol/L (ref 135–145)
Total Bilirubin: 0.5 mg/dL (ref 0.0–1.2)
Total Protein: 6 g/dL — ABNORMAL LOW (ref 6.5–8.1)

## 2024-04-20 MED ORDER — BORTEZOMIB CHEMO SQ INJECTION 3.5 MG (2.5MG/ML)
1.3000 mg/m2 | Freq: Once | INTRAMUSCULAR | Status: AC
  Administered 2024-04-20: 2.75 mg via SUBCUTANEOUS
  Filled 2024-04-20: qty 1.1

## 2024-04-20 MED ORDER — PROCHLORPERAZINE MALEATE 10 MG PO TABS
10.0000 mg | ORAL_TABLET | Freq: Once | ORAL | Status: DC
Start: 2024-04-20 — End: 2024-04-20

## 2024-04-20 NOTE — Patient Instructions (Signed)
 [facility]  Discharge Instructions: Thank you for choosing [facility] to provide your oncology and hematology care.   If you have a lab appointment with the Cancer Center, please go directly to the Cancer Center and check in at the registration area.  Wear comfortable clothing and clothing appropriate for easy access to any Portacath or PICC line.   We strive to give you quality time with your provider. You may need to reschedule your appointment if you arrive late (15 or more minutes).  Arriving late affects you and other patients whose appointments are after yours.  Also, if you miss three or more appointments without notifying the office, you may be dismissed from the clinic at the provider's discretion.      For prescription refill requests, have your pharmacy contact our office and allow 72 hours for refills to be completed.    Today you received the following chemotherapy and/or immunotherapy agents Velcade      To help prevent nausea and vomiting after your treatment, we encourage you to take your nausea medication as directed.  BELOW ARE SYMPTOMS THAT SHOULD BE REPORTED IMMEDIATELY: *FEVER GREATER THAN 100.4 F (38 C) OR HIGHER *CHILLS OR SWEATING *NAUSEA AND VOMITING THAT IS NOT CONTROLLED WITH YOUR NAUSEA MEDICATION *UNUSUAL SHORTNESS OF BREATH *UNUSUAL BRUISING OR BLEEDING *URINARY PROBLEMS (pain or burning when urinating, or frequent urination) *BOWEL PROBLEMS (unusual diarrhea, constipation, pain near the anus) TENDERNESS IN MOUTH AND THROAT WITH OR WITHOUT PRESENCE OF ULCERS (sore throat, sores in mouth, or a toothache) UNUSUAL RASH, SWELLING OR PAIN  UNUSUAL VAGINAL DISCHARGE OR ITCHING   Items with * indicate a potential emergency and should be followed up as soon as possible or go to the Emergency Department if any problems should occur.  Please show the CHEMOTHERAPY ALERT CARD or IMMUNOTHERAPY  ALERT CARD at check-in to the Emergency Department and triage nurse. Should you have questions after your visit or need to cancel or reschedule your appointment, please contact [facility] [facility] - A DEPT OF [facility]  [phone] and follow the prompts.  Office hours are 8:00 a.m. to 4:30 p.m. Monday - Friday. Please note that voicemails left after 4:00 p.m. may not be returned until the following business day.  We are closed weekends and major holidays. You have access to a nurse at all times for urgent questions. Please call the main number to the clinic [phone] and follow the prompts.  For any non-urgent questions, you may also contact your provider using MyChart. We now offer e-Visits for anyone 69 and older to request care online for non-urgent symptoms. For details visit [email or web address].   Also download the MyChart app! Go to the app store, search "MyChart", open the app, select [place], and log in with your MyChart username and password.

## 2024-04-21 ENCOUNTER — Telehealth (HOSPITAL_COMMUNITY): Payer: Self-pay

## 2024-04-21 NOTE — Telephone Encounter (Signed)
 Faxed outside referral for Phase II Cardiac Rehab to [facility].

## 2024-04-25 ENCOUNTER — Other Ambulatory Visit: Payer: Self-pay | Admitting: Family Medicine

## 2024-04-29 ENCOUNTER — Encounter: Payer: Self-pay | Admitting: Cardiology

## 2024-04-29 NOTE — Progress Notes (Deleted)
 Cardiology Office Note:  .   Date:  04/29/2024  ID:  Alexander Armstrong, DOB [birth year], MRN [id number] PCP/Referring Provider: Katrinka Garnette KIDD, MD  [facility] Providers Cardiologist:  Alm Clay, MD Electrophysiologist:  Will Gladis Norton, MD { Click to update primary MD,subspecialty MD or APP then REFRESH:1}    No chief complaint on file.   Patient Profile: .     Alexander Armstrong is a healthy-appearing 83 y.o. male with a PMH reviewed below who presents here for post-cath follow-up.  Alexander Armstrong is a  83 y.o. male  with a PMH noted below who presents here for earlier than usual follow-up to assess exertional dyspnea and chest pain at the request of Katrinka Garnette KIDD, MD.  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 Two-vessel CAD-PCI to LAD and RCA back in 2006 for anterior STEMI LAD PCI with proximal upstream overlapping DES (Synergy XD 3.0 x 12 mm postdilated to 3.3 mm.  With scoring balloon PTCA of ISR) (April 2018) Myoview  was July 2020.  EF 55 to 65%.  Small sized mild severity fixed mid anteroseptal to apical distribution suggestive of prior infarct with no ischemia.  LOW RISK Myoview  03/2024: + Medium sized, moderate intensity inferior reversible perfusion defect => Cath: 70% ISR of proximal RCA stent (3.5 mm) => ScoreFlex and [place] balloon PTCA with 4.0 mm; widely patent LAD stents Paroxysmal VT dating back 2006 treated with flecainide for long period time, then discontinued.  Intolerant of amiodarone , mexiletine and even beta-blockers.  Now on low-dose diltiazem     Tarry Fountain was last seen on March 15, 2024 with complaints of exertional fatigue/atypical angina.  We decided to evaluate him with a Myoview  stress test since he then really had any angina during his previous episodes with active CAD.   This revealed an inferior perfusion defect and we contacted by with telephone to discuss cardiac catheterization that took place on 04/17/2024 revealing 7% ISR of the RCA treated with PTCA.  He now returns for follow-up.  Subjective  Discussed the use of AI scribe software for clinical note transcription with the patient, who gave verbal consent to proceed.  History of Present Illness   Cardiovascular ROS: {roscv:310661}  ROS:  Review of Systems - {ros master:310782}    Objective    Studies Reviewed: .        Cardiac Cath-PCI: 2 V CAD -widely patent LAD stents with 50% D1 stenosis, culprit is 70% focal ISR of prox RCA DES => score flex/[place] balloon PTCA with 4.0 mm wall flex and [place] balloons to high ATM -> majority the stent is fully expanded with 10% focal ISR residual.   (04/17/2024) Diagnostic    Dominance: Right                                                                        Intervention     Risk Assessment/Calculations:     No BP recorded.  {Refresh Note OR Click here to enter BP  :1}***         Physical Exam:   VS:  There were  no vitals taken for this visit.   Wt Readings from Last 3 Encounters:  04/17/24 200 lb (90.7 kg)  04/06/24 196 lb 1.9 oz (89 kg)  03/28/24 197 lb (89.4 kg)    GEN: Well nourished, well developed in no acute distress; *** NECK: No JVD; No carotid bruits CARDIAC: Normal S1, S2; RRR, no murmurs, rubs, gallops RESPIRATORY:  Clear to auscultation without rales, wheezing or rhonchi ; nonlabored, good air movement. ABDOMEN: Soft, non-tender, non-distended EXTREMITIES:  No edema; No deformity      ASSESSMENT AND PLAN: .    Problem List Items Addressed This Visit   None   Assessment and Plan Assessment & Plan        {Are you ordering a CV Procedure (e.g. stress test, cath, DCCV, TEE, etc)?   Press F2        :[id number]}   Follow-Up: No follow-ups on file.  Total time spent: *** min spent with patient + *** min spent charting = ***  min    Signed, Alm MICAEL Clay, MD, MS Alm Clay, M.D., M.S. Interventional Chartered certified accountant  Pager # [phone]

## 2024-05-04 ENCOUNTER — Inpatient Hospital Stay

## 2024-05-04 ENCOUNTER — Inpatient Hospital Stay (HOSPITAL_BASED_OUTPATIENT_CLINIC_OR_DEPARTMENT_OTHER): Admitting: Hematology & Oncology

## 2024-05-04 ENCOUNTER — Encounter: Payer: Self-pay | Admitting: Hematology & Oncology

## 2024-05-04 ENCOUNTER — Other Ambulatory Visit: Payer: Self-pay

## 2024-05-04 VITALS — BP 114/65 | HR 71 | Temp 97.7°F | Resp 16 | Ht 71.0 in | Wt 198.0 lb

## 2024-05-04 DIAGNOSIS — C9001 Multiple myeloma in remission: Secondary | ICD-10-CM

## 2024-05-04 DIAGNOSIS — C9000 Multiple myeloma not having achieved remission: Secondary | ICD-10-CM | POA: Diagnosis not present

## 2024-05-04 DIAGNOSIS — Z5112 Encounter for antineoplastic immunotherapy: Secondary | ICD-10-CM | POA: Diagnosis not present

## 2024-05-04 LAB — CBC WITH DIFFERENTIAL (CANCER CENTER ONLY)
Abs Immature Granulocytes: 0.02 K/uL (ref 0.00–0.07)
Basophils Absolute: 0 K/uL (ref 0.0–0.1)
Basophils Relative: 1 %
Eosinophils Absolute: 0.3 K/uL (ref 0.0–0.5)
Eosinophils Relative: 3 %
HCT: 34.9 % — ABNORMAL LOW (ref 39.0–52.0)
Hemoglobin: 11.6 g/dL — ABNORMAL LOW (ref 13.0–17.0)
Immature Granulocytes: 0 %
Lymphocytes Relative: 16 %
Lymphs Abs: 1.4 K/uL (ref 0.7–4.0)
MCH: 31.9 pg (ref 26.0–34.0)
MCHC: 33.2 g/dL (ref 30.0–36.0)
MCV: 95.9 fL (ref 80.0–100.0)
Monocytes Absolute: 0.8 K/uL (ref 0.1–1.0)
Monocytes Relative: 9 %
Neutro Abs: 6.2 K/uL (ref 1.7–7.7)
Neutrophils Relative %: 71 %
Platelet Count: 129 K/uL — ABNORMAL LOW (ref 150–400)
RBC: 3.64 MIL/uL — ABNORMAL LOW (ref 4.22–5.81)
RDW: 12.9 % (ref 11.5–15.5)
WBC Count: 8.7 K/uL (ref 4.0–10.5)
nRBC: 0 % (ref 0.0–0.2)

## 2024-05-04 LAB — IRON AND IRON BINDING CAPACITY (CC-WL,HP ONLY)
Iron: 79 ug/dL (ref 45–182)
Saturation Ratios: 27 % (ref 17.9–39.5)
TIBC: 293 ug/dL (ref 250–450)
UIBC: 214 ug/dL

## 2024-05-04 LAB — CMP (CANCER CENTER ONLY)
ALT: 15 U/L (ref 0–44)
AST: 19 U/L (ref 15–41)
Albumin: 4 g/dL (ref 3.5–5.0)
Alkaline Phosphatase: 59 U/L (ref 38–126)
Anion gap: 11 (ref 5–15)
BUN: 21 mg/dL (ref 8–23)
CO2: 22 mmol/L (ref 22–32)
Calcium: 9 mg/dL (ref 8.9–10.3)
Chloride: 107 mmol/L (ref 98–111)
Creatinine: 1.02 mg/dL (ref 0.61–1.24)
GFR, Estimated: >60 mL/min (ref >60)
Glucose, Bld: 157 mg/dL — ABNORMAL HIGH (ref 70–99)
Potassium: 4.2 mmol/L (ref 3.5–5.1)
Sodium: 140 mmol/L (ref 135–145)
Total Bilirubin: 0.4 mg/dL (ref 0.0–1.2)
Total Protein: 5.8 g/dL — ABNORMAL LOW (ref 6.5–8.1)

## 2024-05-04 LAB — FERRITIN: Ferritin: 94 ng/mL (ref 24–336)

## 2024-05-04 LAB — LACTATE DEHYDROGENASE: LDH: 161 U/L (ref 98–192)

## 2024-05-04 MED ORDER — BORTEZOMIB CHEMO SQ INJECTION 3.5 MG (2.5MG/ML)
1.3000 mg/m2 | Freq: Once | INTRAMUSCULAR | Status: AC
  Administered 2024-05-04: 2.75 mg via SUBCUTANEOUS
  Filled 2024-05-04: qty 1.1

## 2024-05-04 MED ORDER — DIPHENHYDRAMINE HCL 25 MG PO CAPS: 50.0000 mg | ORAL_CAPSULE | Freq: Once | ORAL | Status: DC

## 2024-05-04 MED ORDER — DARATUMUMAB-HYALURONIDASE-FIHJ 1800-30000 MG-UT/15ML ~~LOC~~ SOLN
1800.0000 mg | Freq: Once | SUBCUTANEOUS | Status: AC
  Administered 2024-05-04: 1800 mg via SUBCUTANEOUS
  Filled 2024-05-04: qty 15

## 2024-05-04 MED ORDER — ACETAMINOPHEN 325 MG PO TABS: 650.0000 mg | ORAL_TABLET | Freq: Once | ORAL | Status: DC

## 2024-05-04 NOTE — Patient Instructions (Signed)
 [facility] - A DEPT OF [facility]  Discharge Instructions: Thank you for choosing [facility] to provide your oncology and hematology care.   If you have a lab appointment with the Cancer Center, please go directly to the Cancer Center and check in at the registration area.  Wear comfortable clothing and clothing appropriate for easy access to any Portacath or PICC line.   We strive to give you quality time with your provider. You may need to reschedule your appointment if you arrive late (15 or more minutes).  Arriving late affects you and other patients whose appointments are after yours.  Also, if you miss three or more appointments without notifying the office, you may be dismissed from the clinic at the provider's discretion.      For prescription refill requests, have your pharmacy contact our office and allow 72 hours for refills to be completed.    Today you received the following chemotherapy and/or immunotherapy agents Darzalex /Velcade       To help prevent nausea and vomiting after your treatment, we encourage you to take your nausea medication as directed.  BELOW ARE SYMPTOMS THAT SHOULD BE REPORTED IMMEDIATELY: *FEVER GREATER THAN 100.4 F (38 C) OR HIGHER *CHILLS OR SWEATING *NAUSEA AND VOMITING THAT IS NOT CONTROLLED WITH YOUR NAUSEA MEDICATION *UNUSUAL SHORTNESS OF BREATH *UNUSUAL BRUISING OR BLEEDING *URINARY PROBLEMS (pain or burning when urinating, or frequent urination) *BOWEL PROBLEMS (unusual diarrhea, constipation, pain near the anus) TENDERNESS IN MOUTH AND THROAT WITH OR WITHOUT PRESENCE OF ULCERS (sore throat, sores in mouth, or a toothache) UNUSUAL RASH, SWELLING OR PAIN  UNUSUAL VAGINAL DISCHARGE OR ITCHING   Items with * indicate a potential emergency and should be followed up as soon as possible or go to the Emergency Department if any problems should occur.  Please show the CHEMOTHERAPY ALERT CARD or  IMMUNOTHERAPY ALERT CARD at check-in to the Emergency Department and triage nurse. Should you have questions after your visit or need to cancel or reschedule your appointment, please contact [facility] - A DEPT OF [facility]  [phone] and follow the prompts.  Office hours are 8:00 a.m. to 4:30 p.m. Monday - Friday. Please note that voicemails left after 4:00 p.m. may not be returned until the following business day.  We are closed weekends and major holidays. You have access to a nurse at all times for urgent questions. Please call the main number to the clinic [phone] and follow the prompts.  For any non-urgent questions, you may also contact your provider using MyChart. We now offer e-Visits for anyone 69 and older to request care online for non-urgent symptoms. For details visit [email or web address].   Also download the MyChart app! Go to the app store, search MyChart, open the app, select [place], and log in with your MyChart username and password.

## 2024-05-04 NOTE — Progress Notes (Signed)
 Hematology and Oncology Follow Up Visit  Alexander Armstrong [id number] 27-Dec-1940 83 y.o. 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 --Start on 10/29/2022     Interim History:  Alexander Armstrong is back for follow-up.  He had a cardiac cath.  This did show that one of the stents was closing down.  He had the stent reexpanded.  So happy that this could be done.  He is doing well otherwise.  His myeloma has responded very nicely to treatment.  When we last saw him, his monoclonal spike was not found in the blood.  His IgG level was 481 mg/dL.  The Kappa light chain was 0.4 mg/dL.  He has had no problems with bowels or bladder.  He is eating well.  He is watching his blood sugars.  He probably is a borderline diabetic.  He has had no cough.  There is been no chest wall pain.  There has been no headache.  He has had no mouth sores.  He has not noted any leg swelling.  Overall, I would say that his performance status is ECOG 0.  .     Wt Readings from Last 3 Encounters:  05/04/24 198 lb (89.8 kg)  04/17/24 200 lb (90.7 kg)  04/06/24 196 lb 1.9 oz (89 kg)     Medications:  Current Outpatient Medications:    ACCU-CHEK GUIDE TEST test strip, 1 EACH BY IN VITRO ROUTE IN THE MORNING, AT NOON, AND AT BEDTIME. MAY SUBSTITUTE TO ANY MANUFACTURER COVERED BY PATIENT'S INSURANCE., Disp: 100 strip, Rfl: 0   Accu-Chek Softclix Lancets lancets, 1 EACH BY DOES NOT APPLY ROUTE IN THE MORNING, AT NOON, AND AT BEDTIME. MAY SUBSTITUTE TO ANY MANUFACTURER COVERED BY PATIENT'S INSURANCE., Disp: 100 each, Rfl: 3   acetaminophen  (TYLENOL ) 500 MG tablet, Take 500 mg by mouth 3 (three) times daily as needed  for moderate pain (pain score 4-6) or headache., Disp: , Rfl:    aspirin  EC 81 MG tablet, Take 81 mg by mouth daily. Swallow whole., Disp: , Rfl:    Blood Glucose Monitoring Suppl DEVI, 1 each by Does not apply route in the morning, at noon, and at bedtime. May substitute to any manufacturer covered by patient's insurance., Disp: 1 each, Rfl: 0   Cholecalciferol (VITAMIN D3) 1000 units CAPS, Take 1,000 Units by mouth at bedtime., Disp: , Rfl:    clopidogrel  (PLAVIX ) 75 MG tablet, Take 1 tablet (75 mg total) by mouth daily. TAKE 1 TABLET(75 MG) BY MOUTH DAILY, Disp: 90 tablet, Rfl: 3   cyanocobalamin  (VITAMIN B12) 1000 MCG tablet, Take 1,000 mcg by mouth daily., Disp: , Rfl:    dexamethasone  (DECADRON ) 4 MG tablet, TAKE 5 TABS (20 MG) WEEKLY THE DAY OF DARATUMUMAB . TAKE WITH BREAKFAST., Disp: 60 tablet, Rfl: 5   diltiazem  (TIADYLT  ER) 120 MG 24 hr capsule, Take 1 capsule (120 mg total) by mouth daily., Disp: 90 capsule, Rfl: 3   diphenhydrAMINE  (BENADRYL  ALLERGY) 25 MG tablet, Take 50 mg by mouth See admin instructions. Take 50 mg by mouth one hour prior to Daratumumab  infusion on Mondays, Disp: , Rfl:    Docusate Sodium  (DSS) 100 MG CAPS, Take 100 mg by mouth daily., Disp: , Rfl:  famciclovir  (FAMVIR ) 250 MG tablet, TAKE 1 TABLET BY MOUTH EVERY DAY, Disp: 90 tablet, Rfl: 2   fluticasone (FLONASE) 50 MCG/ACT nasal spray, Place 1 spray into both nostrils daily as needed for allergies or rhinitis., Disp: , Rfl:    furosemide  (LASIX ) 20 MG tablet, Take 1 tablet (20 mg total) by mouth daily as needed., Disp: 30 tablet, Rfl: 11   loratadine (CLARITIN) 10 MG tablet, Take 10 mg by mouth in the morning., Disp: , Rfl:    MAGNESIUM PO, Take 1 tablet by mouth daily., Disp: , Rfl:    meclizine  (ANTIVERT ) 25 MG tablet, Take 1 tablet (25 mg total) by mouth 3 (three) times daily as needed for dizziness., Disp: 30 tablet, Rfl: 5   montelukast  (SINGULAIR ) 10 MG tablet, Take 10 mg by mouth at bedtime., Disp: , Rfl:     nitroGLYCERIN  (NITROSTAT ) 0.4 MG SL tablet, Place 1 tablet (0.4 mg total) under the tongue every 5 (five) minutes as needed., Disp: 25 tablet, Rfl: 2   pantoprazole  (PROTONIX ) 40 MG tablet, TAKE 1 TABLET BY MOUTH EVERY DAY, Disp: 90 tablet, Rfl: 3   polyethylene glycol (MIRALAX  / GLYCOLAX ) 17 g packet, Take 17 g by mouth daily as needed for moderate constipation., Disp: , Rfl:    REFRESH PLUS 0.5 % SOLN, Place 1 drop into both eyes 3 (three) times daily as needed (for dryness)., Disp: , Rfl:    rosuvastatin  (CRESTOR ) 20 MG tablet, TAKE 1 TABLET BY MOUTH EVERY DAY, Disp: 90 tablet, Rfl: 3   tamsulosin  (FLOMAX ) 0.4 MG CAPS capsule, TAKE 1 CAPSULE BY MOUTH EVERYDAY AT BEDTIME, Disp: 90 capsule, Rfl: 3  Allergies:  Allergies  Allergen Reactions   Latex Other (See Comments)    takes the skin off (tape)    Past Medical History, Surgical history, Social history, and Family History were reviewed and updated.  Review of Systems: Review of Systems  Constitutional: Negative.   HENT:  Negative.    Eyes: Negative.   Respiratory: Negative.    Cardiovascular: Negative.   Gastrointestinal: Negative.   Endocrine: Negative.   Genitourinary: Negative.    Musculoskeletal: Negative.   Skin: Negative.   Neurological: Negative.   Hematological: Negative.   Psychiatric/Behavioral: Negative.      Physical Exam:  Vitals:   05/04/24 0900  BP: 114/65  Pulse: 71  Resp: 16  Temp: 97.7 F (36.5 C)  SpO2: 98%     Wt Readings from Last 3 Encounters:  05/04/24 198 lb (89.8 kg)  04/17/24 200 lb (90.7 kg)  04/06/24 196 lb 1.9 oz (89 kg)    Physical Exam Vitals reviewed.  HENT:     Head: Normocephalic and atraumatic.  Eyes:     Pupils: Pupils are equal, round, and reactive to light.  Cardiovascular:     Rate and Rhythm: Normal rate and regular rhythm.     Heart sounds: Normal heart sounds.  Pulmonary:     Effort: Pulmonary effort is normal.     Breath sounds: Normal breath sounds.   Abdominal:     General: Bowel sounds are normal.     Palpations: Abdomen is soft.  Musculoskeletal:        General: No tenderness or deformity. Normal range of motion.     Cervical back: Normal range of motion.  Lymphadenopathy:     Cervical: No cervical adenopathy.  Skin:    General: Skin is warm and dry.     Findings: No erythema or rash.  Neurological:  Mental Status: He is alert and oriented to person, place, and time.  Psychiatric:        Behavior: Behavior normal.        Thought Content: Thought content normal.        Judgment: Judgment normal.     Lab Results  Component Value Date   WBC 8.7 05/04/2024   HGB 11.6 (L) 05/04/2024   HCT 34.9 (L) 05/04/2024   MCV 95.9 05/04/2024   PLT PENDING 05/04/2024     Chemistry      Component Value Date/Time   NA 140 04/20/2024 0928   K 4.1 04/20/2024 0928   CL 107 04/20/2024 0928   CO2 25 04/20/2024 0928   BUN 25 (H) 04/20/2024 0928   CREATININE 1.24 04/20/2024 0928   CREATININE 1.33 (H) 01/21/2017 1616      Component Value Date/Time   CALCIUM  9.5 04/20/2024 0928   ALKPHOS 61 04/20/2024 0928   AST 14 (L) 04/20/2024 0928   ALT 11 04/20/2024 0928   BILITOT 0.5 04/20/2024 0928       Impression and Plan: Alexander Armstrong is a very nice 83 year old white male.  He has IgG kappa myeloma.  His chromosomal abnormalities resolved with treatment. Currently he is being treated with Darzalex  Faspro/ Velcade .   He had a PET scan on that 01/30/2024 that did not show evidence of bony abnormality.   At some point, we will have to put him on a maintenance protocol.  Hopefully, this might be single agent Faspro.  We will plan to get him back to see us  in another 4 weeks.   Maude JONELLE Crease, MD 7/31/20259:56 AM

## 2024-05-04 NOTE — Progress Notes (Deleted)
 Patient does not want to stay for the 30 minute recommended post IV iron observation. VSS. Patient discharged ambulatory without complaints or concerns.

## 2024-05-05 ENCOUNTER — Encounter: Payer: Self-pay | Admitting: Cardiology

## 2024-05-05 ENCOUNTER — Ambulatory Visit: Attending: Cardiology | Admitting: Cardiology

## 2024-05-05 ENCOUNTER — Ambulatory Visit: Admitting: Cardiology

## 2024-05-05 VITALS — BP 119/68 | HR 65 | Ht 71.0 in | Wt 198.0 lb

## 2024-05-05 DIAGNOSIS — E785 Hyperlipidemia, unspecified: Secondary | ICD-10-CM | POA: Diagnosis present

## 2024-05-05 DIAGNOSIS — R002 Palpitations: Secondary | ICD-10-CM | POA: Insufficient documentation

## 2024-05-05 DIAGNOSIS — E1169 Type 2 diabetes mellitus with other specified complication: Secondary | ICD-10-CM | POA: Insufficient documentation

## 2024-05-05 DIAGNOSIS — I25119 Atherosclerotic heart disease of native coronary artery with unspecified angina pectoris: Secondary | ICD-10-CM | POA: Insufficient documentation

## 2024-05-05 DIAGNOSIS — I2089 Other forms of angina pectoris: Secondary | ICD-10-CM

## 2024-05-05 DIAGNOSIS — I472 Ventricular tachycardia, unspecified: Secondary | ICD-10-CM | POA: Diagnosis not present

## 2024-05-05 LAB — IGG, IGA, IGM
IgA: 14 mg/dL — ABNORMAL LOW (ref 61–437)
IgG (Immunoglobin G), Serum: 429 mg/dL — ABNORMAL LOW (ref 603–1613)
IgM (Immunoglobulin M), Srm: 6 mg/dL — ABNORMAL LOW (ref 15–143)

## 2024-05-05 LAB — KAPPA/LAMBDA LIGHT CHAINS
Kappa free light chain: 8.3 mg/L (ref 3.3–19.4)
Kappa, lambda light chain ratio: 2.18 — ABNORMAL HIGH (ref 0.26–1.65)
Lambda free light chains: 3.8 mg/L — ABNORMAL LOW (ref 5.7–26.3)

## 2024-05-05 NOTE — Patient Instructions (Addendum)
 Medication Instructions:   No changes *If you need a refill on your cardiac medications before your next appointment, please call your pharmacy*   Lab Work: Not needed    Testing/Procedures:    Not needed  Follow-Up: At [facility], you and your health needs are our priority.  As part of our continuing mission to provide you with exceptional heart care, we have created designated Provider Care Teams.  These Care Teams include your primary Cardiologist (physician) and Advanced Practice Providers (APPs -  Physician Assistants and Nurse Practitioners) who all work together to provide you with the care you need, when you need it.     Your next appointment:   6 month(s)  The format for your next appointment:   In Person  Provider:   Dr Anner

## 2024-05-05 NOTE — Progress Notes (Signed)
 Cardiology Office Note:  .   Date:  05/07/2024  ID:  Alexander Armstrong, DOB 04/19/1941, MRN [id number] PCP: Katrinka Garnette KIDD, MD  [facility] Providers Cardiologist:  Alm Clay, MD Electrophysiologist:  Will Gladis Norton, MD     Chief Complaint  Patient presents with   Hospitalization Follow-up    Post-cath follow-up.  Doing well   Coronary Artery Disease    PTCA of RCA.  Doing well    Patient Profile: .     Alexander Armstrong is a healthy-appearing 83 y.o. 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 Two-vessel CAD-PCI to LAD and RCA back in 2006 for anterior STEMI LAD PCI with proximal upstream overlapping DES (Synergy XD 3.0 x 12 mm postdilated to 3.3 mm.  With scoring balloon PTCA of ISR) (April 2018) Myoview  was July 2020.  EF 55 to 65%.  Small sized mild severity fixed mid anteroseptal to apical distribution suggestive of prior infarct with no ischemia.  LOW RISK Myoview  03/2024: + Medium sized, moderate intensity inferior reversible perfusion defect => Cath: 70% ISR of proximal RCA stent (3.5 mm) => ScoreFlex and [place] balloon PTCA with 4.0 mm; widely patent LAD stents Paroxysmal VT dating back 2006 treated with flecainide for long period time, then discontinued.  Intolerant of amiodarone , mexiletine and even beta-blockers.  Now on low-dose diltiazem       Alexander Armstrong was last seen on March 15, 2024 with complaints of exertional fatigue/atypical angina.  We decided to evaluate him with a Myoview  stress test since he then really had any angina during his previous episodes with active CAD.  This revealed an inferior perfusion defect and we contacted by with  telephone to discuss cardiac catheterization that took place on 04/17/2024 revealing 7% ISR of the RCA treated with PTCA.  He now returns for follow-up.  Subjective   Alexander Armstrong returns here today for post-cath follow-up overall doing quite well.  He definitely notes a difference since his PCI.  He actually went from the post-cath recovery to his office to get some work done.  He is back to his full level of activity is not noticing any more the chest tightness or pressure.  Less exertional dyspnea and fatigue.  He did recall having a little discomfort in his chest while PTCA is being performed, but has not had any since.  He is not noticing any rapid or irregular palpitations.  No resting exertional chest pain or dyspnea.  No PND, orthopnea or edema.  He is pretty much back to his normal routine state of health without any complaints.  He even notes that the mild edema that he was having has improved.  Cardiovascular ROS: no chest pain or dyspnea on exertion negative for - edema, irregular heartbeat, orthopnea, palpitations, paroxysmal nocturnal dyspnea, rapid heart rate, shortness of breath, or lightheadedness, dizziness or wooziness, syncope or near syncope, TIA or amaurosis fugax.  Claudication.     Objective   Current Meds: DAPT: Aspirin  81 mg daily; Plavix  75 mg daily Diltiazem  120 mg daily Furosemide  20 [place] daily as needed-has not used since cath except for post cath day 1 Crestor  20 mg daily Protonix  40 mg daily Claritin 10 mg  daily Flomax  0.4 mg Singulair  10 mg nightly Decadron  4 mg-5 tabs weekly for chemo along with 25 mg Benadryl  2 tabs daily.   Studies Reviewed: SABRA        Lab Results  Component Value Date   CHOL 142 12/23/2022   HDL 41.60 12/23/2022   LDLCALC 78 12/23/2022   TRIG 116.0 12/23/2022   CHOLHDL 3 12/23/2022   Lab Results  Component Value Date   NA 140 05/04/2024   K 4.2 05/04/2024   CREATININE 1.02 05/04/2024   GFRNONAA >60 05/04/2024   GLUCOSE  157 (H) 05/04/2024   Lab Results  Component Value Date   HGBA1C 7.7 (A) 02/17/2024     Myoview : Small area of mid inferior wall with moderate reversible perfusion defect consistent with infarct with peri-infarct ischemia; there is also an anterior defect that appears to be fixed likely representing prior anterior infarct.  EF greater than 65%.  Hyperdynamic LV.  Read as INTERMEDIATE RISK. Recommended cardiac catheterization-see below  Cardiac Cath-PCI: 2 V CAD -widely patent LAD stents with 50% D1 stenosis, culprit is 70% focal ISR of prox RCA DES => score flex/[place] balloon PTCA with 4.0 mm wall flex and [place] balloons to high ATM -> majority the stent is fully expanded with 10% focal ISR residual.   (04/17/2024) Diagnostic    Dominance: Right                                                                        Intervention    Risk Assessment/Calculations:           Physical Exam:   VS:  BP 119/68 (BP Location: Left Arm, Patient Position: Sitting)   Pulse 65   Ht 5' 11 (1.803 m)   Wt 198 lb (89.8 kg)   SpO2 98%   BMI 27.62 kg/m    Wt Readings from Last 3 Encounters:  05/05/24 198 lb (89.8 kg)  05/04/24 198 lb (89.8 kg)  04/17/24 200 lb (90.7 kg)    GEN: Healthy-appearing.  Well nourished, well groomed in no acute distress; mild truncal obesity NECK: No JVD; No carotid bruits CARDIAC: Normal S1,  ; RRR, no murmurs, rubs, gallops RESPIRATORY:  Clear to auscultation without rales, wheezing or rhonchi ; nonlabored, good air movement. ABDOMEN: Soft, non-tender, non-distended EXTREMITIES:  No edema; No deformity; radial cath site clean dry and intact.  No hematoma.  Positive Allen's     ASSESSMENT AND PLAN: .    Problem List Items Addressed This Visit       Cardiology Problems   Atypical angina (HCC) (Chronic)   Symptoms essentially resolved post PTCA of RCA. - Continue ASA/Plavix  DAPT along with diltiazem       Coronary artery disease involving native coronary artery of  native heart with angina pectoris (HCC) - Primary (Chronic)   He never was 1 to have normal anginal symptoms although this most recent episode was associated with exertional dyspnea and occasional chest tightness.  Symptoms essentially relieved post PCI/PTCA of the RCA ISR.  No longer symptomatic. Back on DAPT with ASA 81 mg/Plavix  75 mg for at least 3 months after which we could hold aspirin  and continue Plavix  long-term. As of October 18, 2024, okay to hold Plavix  5  to 7 days preop for surgical procedures. For urgent or emergent procedures, could consider holding as of July 18, 2024. Continue rosuvastatin  20 [place] daily and diltiazem  120 mg daily (for antianginal benefit and lieu of beta-blocker because of fatigue and hypotension)      Hyperlipidemia associated with type 2 diabetes mellitus (HCC) (Chronic)   Lipids borderline-controlled based on last labs but due for labs to be checked again this October. -Currently on Crestor  20 mg daily, pending lab result may need to increase dose versus consider adding Nexletol or Zetia (potentially Nexlizet)   Diabetes managed by PCP.:  Not currently any medication.  Consider SGLT2 inhibitor.      Paroxysmal VT (HCC) (Chronic)   Overall pretty well-controlled.  Post PCI, symptoms have also improved. -Continue diltiazem  120 mg daily for suppression        Other   Palpitations (Chronic)   Symptoms improved.  Stable on diltiazem  125 daily.              Follow-Up: Return in about 6 months (around 11/05/2024) for 6 month follow-up with me, [employer].    Signed, Alm MICAEL Clay, MD, MS Alm Clay, M.D., M.S. Interventional Chartered certified accountant  Pager # [phone]

## 2024-05-06 ENCOUNTER — Other Ambulatory Visit: Payer: Self-pay

## 2024-05-07 ENCOUNTER — Encounter: Payer: Self-pay | Admitting: Cardiology

## 2024-05-07 NOTE — Assessment & Plan Note (Signed)
 Symptoms improved.  Stable on diltiazem  125 daily.

## 2024-05-07 NOTE — Assessment & Plan Note (Addendum)
 He never was 1 to have normal anginal symptoms although this most recent episode was associated with exertional dyspnea and occasional chest tightness.  Symptoms essentially relieved post PCI/PTCA of the RCA ISR.  No longer symptomatic. Back on DAPT with ASA 81 mg/Plavix  75 mg for at least 3 months after which we could hold aspirin  and continue Plavix  long-term. As of October 18, 2024, okay to hold Plavix  5 to 7 days preop for surgical procedures. For urgent or emergent procedures, could consider holding as of July 18, 2024. Continue rosuvastatin  20 [place] daily and diltiazem  120 mg daily (for antianginal benefit and lieu of beta-blocker because of fatigue and hypotension)

## 2024-05-07 NOTE — Assessment & Plan Note (Signed)
 Overall pretty well-controlled.  Post PCI, symptoms have also improved. -Continue diltiazem  120 mg daily for suppression

## 2024-05-07 NOTE — Assessment & Plan Note (Addendum)
 Lipids borderline-controlled based on last labs but due for labs to be checked again this October. -Currently on Crestor  20 mg daily, pending lab result may need to increase dose versus consider adding Nexletol or Zetia (potentially Nexlizet)   Diabetes managed by PCP.:  Not currently any medication.  Consider SGLT2 inhibitor.

## 2024-05-07 NOTE — Assessment & Plan Note (Addendum)
 Symptoms essentially resolved post PTCA of RCA. - Continue ASA/Plavix  DAPT along with diltiazem 

## 2024-05-08 ENCOUNTER — Ambulatory Visit: Admitting: Cardiology

## 2024-05-09 LAB — PROTEIN ELECTROPHORESIS, SERUM, WITH REFLEX
A/G Ratio: 2.1 — ABNORMAL HIGH (ref 0.7–1.7)
Albumin ELP: 3.8 g/dL (ref 2.9–4.4)
Alpha-1-Globulin: 0.2 g/dL (ref 0.0–0.4)
Alpha-2-Globulin: 0.7 g/dL (ref 0.4–1.0)
Beta Globulin: 0.7 g/dL (ref 0.7–1.3)
Gamma Globulin: 0.3 g/dL — ABNORMAL LOW (ref 0.4–1.8)
Globulin, Total: 1.8 g/dL — ABNORMAL LOW (ref 2.2–3.9)
Total Protein ELP: 5.6 g/dL — ABNORMAL LOW (ref 6.0–8.5)

## 2024-05-16 ENCOUNTER — Encounter: Payer: Self-pay | Admitting: Family Medicine

## 2024-05-18 ENCOUNTER — Inpatient Hospital Stay: Attending: Hematology & Oncology

## 2024-05-18 ENCOUNTER — Inpatient Hospital Stay

## 2024-05-18 VITALS — BP 111/58 | HR 72 | Temp 98.2°F | Resp 18

## 2024-05-18 DIAGNOSIS — Z5112 Encounter for antineoplastic immunotherapy: Secondary | ICD-10-CM | POA: Insufficient documentation

## 2024-05-18 DIAGNOSIS — D630 Anemia in neoplastic disease: Secondary | ICD-10-CM | POA: Insufficient documentation

## 2024-05-18 DIAGNOSIS — C9000 Multiple myeloma not having achieved remission: Secondary | ICD-10-CM | POA: Insufficient documentation

## 2024-05-18 LAB — CMP (CANCER CENTER ONLY)
ALT: 13 U/L (ref 0–44)
AST: 19 U/L (ref 15–41)
Albumin: 4.3 g/dL (ref 3.5–5.0)
Alkaline Phosphatase: 62 U/L (ref 38–126)
Anion gap: 10 (ref 5–15)
BUN: 22 mg/dL (ref 8–23)
CO2: 25 mmol/L (ref 22–32)
Calcium: 9.7 mg/dL (ref 8.9–10.3)
Chloride: 105 mmol/L (ref 98–111)
Creatinine: 1.19 mg/dL (ref 0.61–1.24)
GFR, Estimated: >60 mL/min (ref >60)
Glucose, Bld: 170 mg/dL — ABNORMAL HIGH (ref 70–99)
Potassium: 4.8 mmol/L (ref 3.5–5.1)
Sodium: 141 mmol/L (ref 135–145)
Total Bilirubin: 0.4 mg/dL (ref 0.0–1.2)
Total Protein: 6.2 g/dL — ABNORMAL LOW (ref 6.5–8.1)

## 2024-05-18 LAB — CBC WITH DIFFERENTIAL (CANCER CENTER ONLY)
Abs Immature Granulocytes: 0.02 K/uL (ref 0.00–0.07)
Basophils Absolute: 0 K/uL (ref 0.0–0.1)
Basophils Relative: 0 %
Eosinophils Absolute: 0.2 K/uL (ref 0.0–0.5)
Eosinophils Relative: 2 %
HCT: 38.1 % — ABNORMAL LOW (ref 39.0–52.0)
Hemoglobin: 12.8 g/dL — ABNORMAL LOW (ref 13.0–17.0)
Immature Granulocytes: 0 %
Lymphocytes Relative: 21 %
Lymphs Abs: 1.9 K/uL (ref 0.7–4.0)
MCH: 32.5 pg (ref 26.0–34.0)
MCHC: 33.6 g/dL (ref 30.0–36.0)
MCV: 96.7 fL (ref 80.0–100.0)
Monocytes Absolute: 0.9 K/uL (ref 0.1–1.0)
Monocytes Relative: 10 %
Neutro Abs: 6 K/uL (ref 1.7–7.7)
Neutrophils Relative %: 67 %
Platelet Count: 144 K/uL — ABNORMAL LOW (ref 150–400)
RBC: 3.94 MIL/uL — ABNORMAL LOW (ref 4.22–5.81)
RDW: 12.6 % (ref 11.5–15.5)
WBC Count: 9.1 K/uL (ref 4.0–10.5)
nRBC: 0 % (ref 0.0–0.2)

## 2024-05-18 MED ORDER — PROCHLORPERAZINE MALEATE 10 MG PO TABS: 10.0000 mg | ORAL_TABLET | Freq: Once | ORAL | Status: DC

## 2024-05-18 MED ORDER — BORTEZOMIB CHEMO SQ INJECTION 3.5 MG (2.5MG/ML)
1.3000 mg/m2 | Freq: Once | INTRAMUSCULAR | Status: AC
  Administered 2024-05-18: 2.75 mg via SUBCUTANEOUS
  Filled 2024-05-18: qty 1.1

## 2024-05-18 NOTE — Patient Instructions (Signed)
 [facility]  Discharge Instructions: Thank you for choosing [facility] to provide your oncology and hematology care.   If you have a lab appointment with the Cancer Center, please go directly to the Cancer Center and check in at the registration area.  Wear comfortable clothing and clothing appropriate for easy access to any Portacath or PICC line.   We strive to give you quality time with your provider. You may need to reschedule your appointment if you arrive late (15 or more minutes).  Arriving late affects you and other patients whose appointments are after yours.  Also, if you miss three or more appointments without notifying the office, you may be dismissed from the clinic at the provider's discretion.      For prescription refill requests, have your pharmacy contact our office and allow 72 hours for refills to be completed.    Today you received the following chemotherapy and/or immunotherapy agents Velcade      To help prevent nausea and vomiting after your treatment, we encourage you to take your nausea medication as directed.  BELOW ARE SYMPTOMS THAT SHOULD BE REPORTED IMMEDIATELY: *FEVER GREATER THAN 100.4 F (38 C) OR HIGHER *CHILLS OR SWEATING *NAUSEA AND VOMITING THAT IS NOT CONTROLLED WITH YOUR NAUSEA MEDICATION *UNUSUAL SHORTNESS OF BREATH *UNUSUAL BRUISING OR BLEEDING *URINARY PROBLEMS (pain or burning when urinating, or frequent urination) *BOWEL PROBLEMS (unusual diarrhea, constipation, pain near the anus) TENDERNESS IN MOUTH AND THROAT WITH OR WITHOUT PRESENCE OF ULCERS (sore throat, sores in mouth, or a toothache) UNUSUAL RASH, SWELLING OR PAIN  UNUSUAL VAGINAL DISCHARGE OR ITCHING   Items with * indicate a potential emergency and should be followed up as soon as possible or go to the Emergency Department if any problems should occur.  Please show the CHEMOTHERAPY ALERT CARD or IMMUNOTHERAPY  ALERT CARD at check-in to the Emergency Department and triage nurse. Should you have questions after your visit or need to cancel or reschedule your appointment, please contact [facility] [facility] - A DEPT OF [facility]  [phone] and follow the prompts.  Office hours are 8:00 a.m. to 4:30 p.m. Monday - Friday. Please note that voicemails left after 4:00 p.m. may not be returned until the following business day.  We are closed weekends and major holidays. You have access to a nurse at all times for urgent questions. Please call the main number to the clinic [phone] and follow the prompts.  For any non-urgent questions, you may also contact your provider using MyChart. We now offer e-Visits for anyone 69 and older to request care online for non-urgent symptoms. For details visit [email or web address].   Also download the MyChart app! Go to the app store, search "MyChart", open the app, select [place], and log in with your MyChart username and password.

## 2024-05-23 ENCOUNTER — Encounter: Payer: Self-pay | Admitting: Hematology & Oncology

## 2024-05-24 ENCOUNTER — Encounter: Admitting: Urology

## 2024-05-24 NOTE — Progress Notes (Deleted)
 Assessment: 1. BPH with obstruction/lower urinary tract symptoms     Plan: I personally reviewed the patient's chart including provider notes.   Chief Complaint: No chief complaint on file.   History of Present Illness:  Alexander Armstrong is a 83 y.o. male who is seen for evaluation of BPH with lower urinary tract symptoms and urge incontinence. He was previously seen by Dr. Cooper Sar at [facility] in [facility] in May 2018.  At that time he reported nocturia 3-4 times and occasional urge incontinence.  He was on tamsulosin .  He was also taking oxybutynin twice daily with improvement in his symptoms.SABRA PVR was 66 mL.   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 Ant Stemi (1/06) mLAD: Taxus DES 3.0 x 20; b. (2/06) staged RCA: Taxus DES 3.5 x 16; c. (01/2017) HR Myoview  (~Ant defect w/ normal WM) @ [facility] (Dr. 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 Had documented WIDE COMPLEX Tachycardia on loop recorder.   Cataract bilateral cataracts   Diverticulosis    Esophageal stricture    GERD (gastroesophageal reflux disease)    Hiatal hernia 11/05/2010   HLD (hyperlipidemia)    Implantable loop recorder present    Multiple myeloma (HCC) 08/13/2022   Nonsustained paroxysmal ventricular tachycardia (HCC) 2014   Initially controlled with flecainide. Unable to induce during EP study.;  Due to CAD, flecainide discontinued -> intolerant of both mexiletine and amiodarone  -> Dr. Inocencio (EP-Cards) d/c'd mexilitine & statrted low dose Diltizem XT; Holter Monitor 12/2018 - HR 47-114, rate PACs/PVCs, no Afib/flutter or SVT, VT.    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 Staged PCI to the RCA.    Past Surgical History:  Past Surgical History:  Procedure Laterality Date   CARDIAC CATHETERIZATION  ~2011   [facility]: re-look Cath - patent stents   CORONARY BALLOON ANGIOPLASTY N/A 04/17/2024   Procedure: CORONARY BALLOON ANGIOPLASTY;  Surgeon: Anner Alm ORN, MD;  Location: [facility] INVASIVE CV LAB;  ScorFlex & [place] Balloon PTCA of prox RCA DES: - 4.0 mm balloon - reduced 70%-> 10% focal.   CORONARY PRESSURE/FFR STUDY N/A 01/27/2017   Procedure: Intravascular Pressure Wire/FFR Study;  Surgeon: Alm ORN Anner, MD;  Location: [facility] INVASIVE CV LAB;  Service: Cardiovascular;  Laterality: N/A;   CORONARY STENT INTERVENTION N/A 01/27/2017   Procedure: Coronary Stent Intervention;  Surgeon: Alm ORN Anner, MD;  Location: [facility] INVASIVE CV LAB: mLAD (just after D1) overlapping prior DES -> SYNERGY DES 3X12 drug eluting stent   ELECTROPHYSIOLOGIC STUDY  2014   Unable to stimulate VT seen on loop recorder   HOLTER MONITOR  12/2018    (Off of mexiletine, only on diltiazem ) heart rate ranged from 47 to 114 bpm.  Rare PACs and PVCs.  Sinus rhythm noted no A. fib or other arrhythmia.   implanted loop heart monitor x2  ~2011; 2014   Per report- batter out of date; apparently captured a prolonged episode of wide  complex tachycardia associated with an episode of weakness/near syncope   LEFT HEART CATH AND CORONARY ANGIOGRAPHY N/A 01/27/2017   Procedure: Left Heart Cath and Coronary Angiography;  Surgeon: Alm LELON Clay, MD;  Location: [facility] INVASIVE CV LAB: mLAD 99% stenosis pre-Stent & ISR --> PCI. Ost DI ~50%. pRCA DES ~50-60% FFR 0.86.   LEFT HEART CATH AND CORONARY ANGIOGRAPHY N/A 04/17/2024   Procedure: LEFT HEART CATH AND CORONARY ANGIOGRAPHY;  Surgeon: Clay Alm LELON, MD;  Location: [facility] INVASIVE CV LAB;  70% ISR of prox RCA stent (PTCA), 35% mRCA; patent overlapping LAD DES with 50%  ost Diag.   NM MYOVIEW  LTD  03/2024   Med size, mod Severity Reversible Inferior Defect, normal LVEF.   PERCUTANEOUS CORONARY STENT INTERVENTION (PCI-S)  January-February 2006   a. mLAD - Taxus DES 3.0 x 20; b. (11/06/04) staged PCI to RCA Taxus DES 3.5 x 16; c. 01/27/2017: PCI to mLAD prox to old  stent - Synergy DES 3.0 x 12   TRANSTHORACIC ECHOCARDIOGRAM  10/11/2020    [facility]):  Normal LV size & function. EF 55-60%. NO RWMA. Normal filling parameters.  Mild TR however estimated moderate pulmonary hypertension with RVSP 52 mmHg.  IVC is mildly dilated.  No vegetations noted.  No significant change from last study.   TRANSTHORACIC ECHOCARDIOGRAM  05/2020   [facility]: 05/21/2020: Normal LV size and function.  Normal valves.  No effusion.; 06/04/2020: Normal LV size and function EF 60 to 65%.  Trace MR and TR.  No pulmonary hypertension.  Stable   UMBILICAL HERNIA REPAIR     UMBILICAL HERNIA REPAIR N/A 12/08/2022   Procedure: UMBILICAL HERNIA REPAIR WITH MESH;  Surgeon: Belinda Cough, MD;  Location: [facility];  Service: General;  Laterality: N/A;    Allergies:  Allergies  Allergen Reactions   Latex Other (See Comments)    takes the skin off (tape)    Family History:  Family History  Problem Relation Age of Onset   Uterine cancer Mother    Multiple myeloma Mother    Heart disease Mother    Prostate cancer Father    Cancer Father        sarcoma   Macular degeneration Father    Heart attack Brother    Glaucoma Paternal Aunt    Colon cancer Neg Hx    Esophageal cancer Neg Hx    Rectal cancer Neg Hx    Stomach cancer Neg Hx     Social History:  Social History   Tobacco Use   Smoking status: Never   Smokeless tobacco: Never  Vaping Use   Vaping status: Never Used  Substance Use Topics   Alcohol use: No   Drug use: No    Review of symptoms:  Constitutional:  Negative for unexplained weight loss, night sweats, fever, chills ENT:  Negative for  nose bleeds, sinus pain, painful swallowing CV:  Negative for chest pain, shortness of breath, exercise intolerance, palpitations, loss of consciousness Resp:  Negative for cough, wheezing, shortness of breath GI:  Negative for nausea, vomiting, diarrhea, bloody stools GU:  Positives noted in HPI; otherwise negative for gross hematuria, dysuria, urinary incontinence Neuro:  Negative for seizures, poor balance, limb weakness, slurred speech Psych:  Negative for lack of energy, depression, anxiety Endocrine:  Negative for polydipsia, polyuria, symptoms of hypoglycemia (dizziness, hunger, sweating) Hematologic:  Negative for anemia, purpura, petechia, prolonged or excessive bleeding, use of anticoagulants  Allergic:  Negative for difficulty breathing or  choking as a result of exposure to anything; no shellfish allergy; no allergic response (rash/itch) to materials, foods  Physical exam: There were no vitals taken for this visit. GENERAL APPEARANCE:  Well appearing, well developed, well nourished, NAD HEENT: Atraumatic, Normocephalic, oropharynx clear. NECK: Supple without lymphadenopathy or thyromegaly. LUNGS: Clear to auscultation bilaterally. HEART: Regular Rate and Rhythm without murmurs, gallops, or rubs. ABDOMEN: Soft, non-tender, No Masses. EXTREMITIES: Moves all extremities well.  Without clubbing, cyanosis, or edema. NEUROLOGIC:  Alert and oriented x 3, normal gait, CN II-XII grossly intact.  MENTAL STATUS:  Appropriate. BACK:  Non-tender to palpation.  No CVAT SKIN:  Warm, dry and intact.   GU: Penis:  {Exam; penis:5791} Meatus: {Meatus:15530} Scrotum: {pe scrotum:310183} Testis: {Exam; testicles:5790} Epididymis: {epididymis zkjf:688612} Prostate: {Exam; prostate:5793} Rectum: {rectal exam:26517}   Results: U/A:  PVR =

## 2024-06-01 ENCOUNTER — Other Ambulatory Visit: Payer: Self-pay

## 2024-06-01 ENCOUNTER — Inpatient Hospital Stay

## 2024-06-01 ENCOUNTER — Inpatient Hospital Stay (HOSPITAL_BASED_OUTPATIENT_CLINIC_OR_DEPARTMENT_OTHER): Admitting: Hematology & Oncology

## 2024-06-01 ENCOUNTER — Encounter: Payer: Self-pay | Admitting: Hematology & Oncology

## 2024-06-01 VITALS — BP 123/74 | HR 65 | Temp 97.6°F | Resp 18 | Ht 71.0 in | Wt 197.0 lb

## 2024-06-01 DIAGNOSIS — C9000 Multiple myeloma not having achieved remission: Secondary | ICD-10-CM

## 2024-06-01 DIAGNOSIS — Z5112 Encounter for antineoplastic immunotherapy: Secondary | ICD-10-CM | POA: Diagnosis not present

## 2024-06-01 DIAGNOSIS — C9001 Multiple myeloma in remission: Secondary | ICD-10-CM | POA: Diagnosis not present

## 2024-06-01 LAB — CBC WITH DIFFERENTIAL (CANCER CENTER ONLY)
Abs Immature Granulocytes: 0.02 K/uL (ref 0.00–0.07)
Basophils Absolute: 0.1 K/uL (ref 0.0–0.1)
Basophils Relative: 1 %
Eosinophils Absolute: 0.2 K/uL (ref 0.0–0.5)
Eosinophils Relative: 3 %
HCT: 37.2 % — ABNORMAL LOW (ref 39.0–52.0)
Hemoglobin: 12.4 g/dL — ABNORMAL LOW (ref 13.0–17.0)
Immature Granulocytes: 0 %
Lymphocytes Relative: 25 %
Lymphs Abs: 1.7 K/uL (ref 0.7–4.0)
MCH: 32.4 pg (ref 26.0–34.0)
MCHC: 33.3 g/dL (ref 30.0–36.0)
MCV: 97.1 fL (ref 80.0–100.0)
Monocytes Absolute: 0.9 K/uL (ref 0.1–1.0)
Monocytes Relative: 12 %
Neutro Abs: 4.1 K/uL (ref 1.7–7.7)
Neutrophils Relative %: 59 %
Platelet Count: 142 K/uL — ABNORMAL LOW (ref 150–400)
RBC: 3.83 MIL/uL — ABNORMAL LOW (ref 4.22–5.81)
RDW: 12.7 % (ref 11.5–15.5)
WBC Count: 6.9 K/uL (ref 4.0–10.5)
nRBC: 0 % (ref 0.0–0.2)

## 2024-06-01 LAB — CMP (CANCER CENTER ONLY)
ALT: 14 U/L (ref 0–44)
AST: 20 U/L (ref 15–41)
Albumin: 4.3 g/dL (ref 3.5–5.0)
Alkaline Phosphatase: 65 U/L (ref 38–126)
Anion gap: 10 (ref 5–15)
BUN: 27 mg/dL — ABNORMAL HIGH (ref 8–23)
CO2: 25 mmol/L (ref 22–32)
Calcium: 9.4 mg/dL (ref 8.9–10.3)
Chloride: 106 mmol/L (ref 98–111)
Creatinine: 1.17 mg/dL (ref 0.61–1.24)
GFR, Estimated: >60 mL/min (ref >60)
Glucose, Bld: 132 mg/dL — ABNORMAL HIGH (ref 70–99)
Potassium: 4.4 mmol/L (ref 3.5–5.1)
Sodium: 141 mmol/L (ref 135–145)
Total Bilirubin: 0.3 mg/dL (ref 0.0–1.2)
Total Protein: 6 g/dL — ABNORMAL LOW (ref 6.5–8.1)

## 2024-06-01 LAB — LACTATE DEHYDROGENASE: LDH: 171 U/L (ref 98–192)

## 2024-06-01 MED ORDER — BORTEZOMIB CHEMO SQ INJECTION 3.5 MG (2.5MG/ML)
1.3000 mg/m2 | Freq: Once | INTRAMUSCULAR | Status: AC
  Administered 2024-06-01: 2.75 mg via SUBCUTANEOUS
  Filled 2024-06-01: qty 1.1

## 2024-06-01 MED ORDER — DIPHENHYDRAMINE HCL 25 MG PO CAPS
50.0000 mg | ORAL_CAPSULE | Freq: Once | ORAL | Status: DC
Start: 2024-06-01 — End: 2024-06-01

## 2024-06-01 MED ORDER — DARATUMUMAB-HYALURONIDASE-FIHJ 1800-30000 MG-UT/15ML ~~LOC~~ SOLN
1800.0000 mg | Freq: Once | SUBCUTANEOUS | Status: AC
  Administered 2024-06-01: 1800 mg via SUBCUTANEOUS
  Filled 2024-06-01: qty 15

## 2024-06-01 MED ORDER — ACETAMINOPHEN 325 MG PO TABS: 650.0000 mg | ORAL_TABLET | Freq: Once | ORAL | Status: DC

## 2024-06-01 NOTE — Patient Instructions (Signed)
 [facility]  Discharge Instructions: Thank you for choosing [facility] to provide your oncology and hematology care.   If you have a lab appointment with the Cancer Center, please go directly to the Cancer Center and check in at the registration area.  Wear comfortable clothing and clothing appropriate for easy access to any Portacath or PICC line.   We strive to give you quality time with your provider. You may need to reschedule your appointment if you arrive late (15 or more minutes).  Arriving late affects you and other patients whose appointments are after yours.  Also, if you miss three or more appointments without notifying the office, you may be dismissed from the clinic at the provider's discretion.      For prescription refill requests, have your pharmacy contact our office and allow 72 hours for refills to be completed.    Today you received the following chemotherapy and/or immunotherapy agents:  Faspro and Velcade      To help prevent nausea and vomiting after your treatment, we encourage you to take your nausea medication as directed.  BELOW ARE SYMPTOMS THAT SHOULD BE REPORTED IMMEDIATELY: *FEVER GREATER THAN 100.4 F (38 C) OR HIGHER *CHILLS OR SWEATING *NAUSEA AND VOMITING THAT IS NOT CONTROLLED WITH YOUR NAUSEA MEDICATION *UNUSUAL SHORTNESS OF BREATH *UNUSUAL BRUISING OR BLEEDING *URINARY PROBLEMS (pain or burning when urinating, or frequent urination) *BOWEL PROBLEMS (unusual diarrhea, constipation, pain near the anus) TENDERNESS IN MOUTH AND THROAT WITH OR WITHOUT PRESENCE OF ULCERS (sore throat, sores in mouth, or a toothache) UNUSUAL RASH, SWELLING OR PAIN  UNUSUAL VAGINAL DISCHARGE OR ITCHING   Items with * indicate a potential emergency and should be followed up as soon as possible or go to the Emergency Department if any problems should occur.  Please show the CHEMOTHERAPY ALERT CARD or  IMMUNOTHERAPY ALERT CARD at check-in to the Emergency Department and triage nurse. Should you have questions after your visit or need to cancel or reschedule your appointment, please contact [facility] - A DEPT OF [facility]  [phone] and follow the prompts.  Office hours are 8:00 a.m. to 4:30 p.m. Monday - Friday. Please note that voicemails left after 4:00 p.m. may not be returned until the following business day.  We are closed weekends and major holidays. You have access to a nurse at all times for urgent questions. Please call the main number to the clinic [phone] and follow the prompts.  For any non-urgent questions, you may also contact your provider using MyChart. We now offer e-Visits for anyone 24 and older to request care online for non-urgent symptoms. For details visit [email or web address].   Also download the MyChart app! Go to the app store, search "MyChart", open the app, select [place], and log in with your MyChart username and password.

## 2024-06-01 NOTE — Progress Notes (Signed)
 Hematology and Oncology Follow Up Visit  Alexander Armstrong [id number] 1940/12/13 83 y.o. 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 --Start on 10/29/2022     Interim History:  Alexander Armstrong is back for follow-up.  He had a cardiac cath.  This did show that one of the stents was closing down.  He had the stent reexpanded.  I am so happy that this could be done.  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  He is feeling well.  He really has had no complaints.  So far, the myeloma has been in remission.  When we last saw him, there was no monoclonal spike in his blood.  His IgG level was 429 mg/dL.  His kappa light chain was 0.8 mg/dL.  At this point, I really think that a we can give 1 final treatment for him.  I would then hold off on further treatment.  His last bone marrow test was done a year ago so I think it would be worthwhile to get another bone marrow test on him.  I think that if he is in remission on the bone marrow test, that we can probably hold off on treating him for quite a while.  He has had no problem with bowels or bladder.  He has had no problems with fever.  He has had no swollen lymph nodes.  He has had no rashes.  He has had no leg swelling.  Overall, I would say that his performance status is probably ECOG 1.   Wt Readings from Last 3 Encounters:  06/01/24 197 lb (89.4 kg)  05/05/24 198 lb (89.8 kg)  05/04/24 198 lb (89.8 kg)     Medications:  Current Outpatient Medications:    ACCU-CHEK GUIDE TEST test strip, 1 EACH BY IN VITRO ROUTE IN THE MORNING, AT NOON, AND AT BEDTIME. MAY SUBSTITUTE TO ANY  MANUFACTURER COVERED BY PATIENT'S INSURANCE., Disp: 100 strip, Rfl: 0   Accu-Chek Softclix Lancets lancets, 1 EACH BY DOES NOT APPLY ROUTE IN THE MORNING, AT NOON, AND AT BEDTIME. MAY SUBSTITUTE TO ANY MANUFACTURER COVERED BY PATIENT'S INSURANCE., Disp: 100 each, Rfl: 3   acetaminophen  (TYLENOL ) 500 MG tablet, Take 500 mg by mouth 3 (three) times daily as needed for moderate pain (pain score 4-6) or headache., Disp: , Rfl:    aspirin  EC 81 MG tablet, Take 81 mg by mouth daily. Swallow whole., Disp: , Rfl:    Blood Glucose Monitoring Suppl DEVI, 1 each by Does not apply route in the morning, at noon, and at bedtime. May substitute to any manufacturer covered by patient's insurance., Disp: 1 each, Rfl: 0   Cholecalciferol (VITAMIN D3) 1000 units CAPS, Take 1,000 Units by mouth at bedtime., Disp: , Rfl:    clopidogrel  (PLAVIX ) 75 MG tablet, Take 1 tablet (75 mg total) by mouth daily. TAKE 1 TABLET(75 MG) BY MOUTH DAILY, Disp: 90 tablet, Rfl: 3   cyanocobalamin  (VITAMIN B12) 1000 MCG tablet, Take 1,000 mcg by mouth daily., Disp: , Rfl:    dexamethasone  (DECADRON ) 4 MG tablet, TAKE 5 TABS (20 MG) WEEKLY THE DAY OF DARATUMUMAB . TAKE WITH BREAKFAST., Disp: 60 tablet, Rfl: 5  diltiazem  (TIADYLT  ER) 120 MG 24 hr capsule, Take 1 capsule (120 mg total) by mouth daily., Disp: 90 capsule, Rfl: 3   diphenhydrAMINE  (BENADRYL  ALLERGY) 25 MG tablet, Take 50 mg by mouth See admin instructions. Take 50 mg by mouth one hour prior to Daratumumab  infusion on Mondays, Disp: , Rfl:    Docusate Sodium  (DSS) 100 MG CAPS, Take 100 mg by mouth daily., Disp: , Rfl:    famciclovir  (FAMVIR ) 250 MG tablet, TAKE 1 TABLET BY MOUTH EVERY DAY, Disp: 90 tablet, Rfl: 2   fluticasone (FLONASE) 50 MCG/ACT nasal spray, Place 1 spray into both nostrils daily as needed for allergies or rhinitis., Disp: , Rfl:    furosemide  (LASIX ) 20 MG tablet, Take 1 tablet (20 mg total) by mouth daily as needed., Disp: 30 tablet, Rfl: 11   loratadine  (CLARITIN) 10 MG tablet, Take 10 mg by mouth in the morning., Disp: , Rfl:    MAGNESIUM PO, Take 1 tablet by mouth daily., Disp: , Rfl:    meclizine  (ANTIVERT ) 25 MG tablet, Take 1 tablet (25 mg total) by mouth 3 (three) times daily as needed for dizziness., Disp: 30 tablet, Rfl: 5   montelukast  (SINGULAIR ) 10 MG tablet, Take 10 mg by mouth at bedtime., Disp: , Rfl:    nitroGLYCERIN  (NITROSTAT ) 0.4 MG SL tablet, Place 1 tablet (0.4 mg total) under the tongue every 5 (five) minutes as needed., Disp: 25 tablet, Rfl: 2   pantoprazole  (PROTONIX ) 40 MG tablet, TAKE 1 TABLET BY MOUTH EVERY DAY, Disp: 90 tablet, Rfl: 3   polyethylene glycol (MIRALAX  / GLYCOLAX ) 17 g packet, Take 17 g by mouth daily as needed for moderate constipation., Disp: , Rfl:    REFRESH PLUS 0.5 % SOLN, Place 1 drop into both eyes 3 (three) times daily as needed (for dryness)., Disp: , Rfl:    rosuvastatin  (CRESTOR ) 20 MG tablet, TAKE 1 TABLET BY MOUTH EVERY DAY, Disp: 90 tablet, Rfl: 3   tamsulosin  (FLOMAX ) 0.4 MG CAPS capsule, TAKE 1 CAPSULE BY MOUTH EVERYDAY AT BEDTIME, Disp: 90 capsule, Rfl: 3  Allergies:  Allergies  Allergen Reactions   Latex Other (See Comments)    takes the skin off (tape)    Past Medical History, Surgical history, Social history, and Family History were reviewed and updated.  Review of Systems: Review of Systems  Constitutional: Negative.   HENT:  Negative.    Eyes: Negative.   Respiratory: Negative.    Cardiovascular: Negative.   Gastrointestinal: Negative.   Endocrine: Negative.   Genitourinary: Negative.    Musculoskeletal: Negative.   Skin: Negative.   Neurological: Negative.   Hematological: Negative.   Psychiatric/Behavioral: Negative.      Physical Exam:  Vitals:   06/01/24 0900  BP: 123/74  Pulse: 65  Resp: 18  Temp: 97.6 F (36.4 C)  SpO2: 99%     Wt Readings from Last 3 Encounters:  06/01/24 197 lb (89.4 kg)  05/05/24 198 lb (89.8 kg)  05/04/24 198 lb (89.8 kg)     Physical Exam Vitals reviewed.  HENT:     Head: Normocephalic and atraumatic.  Eyes:     Pupils: Pupils are equal, round, and reactive to light.  Cardiovascular:     Rate and Rhythm: Normal rate and regular rhythm.     Heart sounds: Normal heart sounds.  Pulmonary:     Effort: Pulmonary effort is normal.     Breath sounds: Normal breath sounds.  Abdominal:     General: Bowel  sounds are normal.     Palpations: Abdomen is soft.  Musculoskeletal:        General: No tenderness or deformity. Normal range of motion.     Cervical back: Normal range of motion.  Lymphadenopathy:     Cervical: No cervical adenopathy.  Skin:    General: Skin is warm and dry.     Findings: No erythema or rash.  Neurological:     Mental Status: He is alert and oriented to person, place, and time.  Psychiatric:        Behavior: Behavior normal.        Thought Content: Thought content normal.        Judgment: Judgment normal.     Lab Results  Component Value Date   WBC 6.9 06/01/2024   HGB 12.4 (L) 06/01/2024   HCT 37.2 (L) 06/01/2024   MCV 97.1 06/01/2024   PLT 142 (L) 06/01/2024     Chemistry      Component Value Date/Time   NA 141 06/01/2024 0921   K 4.4 06/01/2024 0921   CL 106 06/01/2024 0921   CO2 25 06/01/2024 0921   BUN 27 (H) 06/01/2024 0921   CREATININE 1.17 06/01/2024 0921   CREATININE 1.33 (H) 01/21/2017 1616      Component Value Date/Time   CALCIUM  9.4 06/01/2024 0921   ALKPHOS 65 06/01/2024 0921   AST 20 06/01/2024 0921   ALT 14 06/01/2024 0921   BILITOT 0.3 06/01/2024 0921       Impression and Plan: Mr. Manfredo is a very nice 83 year old white male.  He has IgG kappa myeloma.  His chromosomal abnormalities resolved with treatment. Currently he is being treated with Darzalex  Faspro/ Velcade .   I forgot to mention that he had a nice birthday a couple weeks ago.  He truly deserved to have a good time.  Again, we will go ahead and do 1 final cycle of treatment on him.   I will then plan for a bone marrow test to be done after he gets back from [place]  in October..  I will then see him back and if all looks good, then we will just follow him along.   Maude JONELLE Crease, MD 8/28/202510:10 AM

## 2024-06-02 ENCOUNTER — Other Ambulatory Visit: Payer: Self-pay

## 2024-06-02 LAB — KAPPA/LAMBDA LIGHT CHAINS
Kappa free light chain: 8.8 mg/L (ref 3.3–19.4)
Kappa, lambda light chain ratio: 1.49 (ref 0.26–1.65)
Lambda free light chains: 5.9 mg/L (ref 5.7–26.3)

## 2024-06-02 LAB — IGG, IGA, IGM
IgA: 15 mg/dL — ABNORMAL LOW (ref 61–437)
IgG (Immunoglobin G), Serum: 482 mg/dL — ABNORMAL LOW (ref 603–1613)
IgM (Immunoglobulin M), Srm: 6 mg/dL — ABNORMAL LOW (ref 15–143)

## 2024-06-08 LAB — PROTEIN ELECTROPHORESIS, SERUM, WITH REFLEX
A/G Ratio: 1.7 (ref 0.7–1.7)
Albumin ELP: 3.6 g/dL (ref 2.9–4.4)
Alpha-1-Globulin: 0.2 g/dL (ref 0.0–0.4)
Alpha-2-Globulin: 0.7 g/dL (ref 0.4–1.0)
Beta Globulin: 0.8 g/dL (ref 0.7–1.3)
Gamma Globulin: 0.4 g/dL (ref 0.4–1.8)
Globulin, Total: 2.1 g/dL — ABNORMAL LOW (ref 2.2–3.9)
M-Spike, %: 0.1 g/dL — ABNORMAL HIGH
SPEP Interpretation: 0
Total Protein ELP: 5.7 g/dL — ABNORMAL LOW (ref 6.0–8.5)

## 2024-06-08 LAB — IMMUNOFIXATION REFLEX, SERUM
IgA: 14 mg/dL — ABNORMAL LOW (ref 61–437)
IgG (Immunoglobin G), Serum: 486 mg/dL — ABNORMAL LOW (ref 603–1613)
IgM (Immunoglobulin M), Srm: 7 mg/dL — ABNORMAL LOW (ref 15–143)

## 2024-06-11 NOTE — H&P (Signed)
 Chief Complaint: IgG kappa myeloma, anemia; referred for image guided bone marrow biopsy to assess treatment response  Referring Provider(s): Ennever,P  Supervising Physician: Shick,T  Patient Status: [facility] - Out-pt  History of Present Illness: Alexander Armstrong is an 83 y.o. male with past medical history significant for coronary artery disease with prior MI/stenting, arthritis, Barrett's esophagus, skin cancer, diverticulosis, esophageal stricture, GERD, hiatal hernia, hyperlipidemia, implantable loop recorder , paroxysmal VT, IgG kappa myeloma, anemia.  He presents today for image guided bone marrow biopsy to assess treatment response of myeloma.   Patient is Full Code  Past Medical History:  Diagnosis Date   2 V CAD - DES PCI mLAD & prox RCA 11/03/2004   a. Ant Stemi (1/06) mLAD: Taxus DES 3.0 x 20; b. (2/06) staged RCA: Taxus DES 3.5 x 16; c. (01/2017) HR Myoview  (~Ant defect w/ normal WM) @ [facility] (Dr. Anner): LAD prox stent edge 99%- Synergy DES 3.0 x 12); Scoreflex & Regent PTCA of 70% RCA Stent ISR (04/2024)   Allergy    Arthritis    Barrett esophagus 12/26/2007, 11/05/2010   basal cell skin cancer    Cardiac syncope    No episode since starting flecainide. Had documented WIDE COMPLEX Tachycardia on loop recorder.   Cataract bilateral cataracts   Diverticulosis    Esophageal stricture    GERD (gastroesophageal reflux disease)    Hiatal hernia 11/05/2010   HLD (hyperlipidemia)    Implantable loop recorder present    Multiple myeloma (HCC) 08/13/2022   Nonsustained paroxysmal ventricular tachycardia (HCC) 2014   Initially controlled with flecainide. Unable to induce during EP study.;  Due to CAD, flecainide discontinued -> intolerant of both mexiletine and amiodarone  -> Dr. Inocencio (EP-Cards) d/c'd mexilitine & statrted low dose Diltizem XT; Holter Monitor 12/2018 - HR 47-114, rate PACs/PVCs, no Afib/flutter or SVT, VT.    Pneumonia    2022   Reflux gastritis    STEMI involving left  anterior descending coronary artery (HCC) 11/03/2004   [facility]: Occluded LAD treated with DES. Staged PCI to the RCA.    Past Surgical History:  Procedure Laterality Date   CARDIAC CATHETERIZATION  ~2011   [facility]: re-look Cath - patent stents   CORONARY BALLOON ANGIOPLASTY N/A 04/17/2024   Procedure: CORONARY BALLOON ANGIOPLASTY;  Surgeon: Anner Alm ORN, MD;  Location: [facility] INVASIVE CV LAB;  ScorFlex & [place] Balloon PTCA of prox RCA DES: - 4.0 mm balloon - reduced 70%-> 10% focal.   CORONARY PRESSURE/FFR STUDY N/A 01/27/2017   Procedure: Intravascular Pressure Wire/FFR Study;  Surgeon: Alm ORN Anner, MD;  Location: [facility] INVASIVE CV LAB;  Service: Cardiovascular;  Laterality: N/A;   CORONARY STENT INTERVENTION N/A 01/27/2017   Procedure: Coronary Stent Intervention;  Surgeon: Alm ORN Anner, MD;  Location: [facility] INVASIVE CV LAB: mLAD (just after D1) overlapping prior DES -> SYNERGY DES 3X12 drug eluting stent   ELECTROPHYSIOLOGIC STUDY  2014   Unable to stimulate VT seen on loop recorder   HOLTER MONITOR  12/2018    (Off of mexiletine, only on diltiazem ) heart rate ranged from 47 to 114 bpm.  Rare PACs and PVCs.  Sinus rhythm noted no A. fib or other arrhythmia.   implanted loop heart monitor x2  ~2011; 2014   Per report- batter out of date; apparently captured a prolonged episode of wide complex tachycardia associated with an episode of weakness/near syncope   LEFT HEART CATH AND CORONARY ANGIOGRAPHY N/A 01/27/2017   Procedure: Left  Heart Cath and Coronary Angiography;  Surgeon: Alm LELON Clay, MD;  Location: [facility] INVASIVE CV LAB: mLAD 99% stenosis pre-Stent & ISR --> PCI. Ost DI ~50%. pRCA DES ~50-60% FFR 0.86.   LEFT HEART CATH AND CORONARY ANGIOGRAPHY N/A 04/17/2024   Procedure: LEFT HEART CATH AND CORONARY ANGIOGRAPHY;  Surgeon: Clay Alm LELON, MD;  Location: [facility] INVASIVE CV LAB;  70% ISR of prox RCA stent (PTCA), 35% mRCA; patent overlapping LAD DES with 50% ost Diag.   NM  MYOVIEW  LTD  03/2024   Med size, mod Severity Reversible Inferior Defect, normal LVEF.   PERCUTANEOUS CORONARY STENT INTERVENTION (PCI-S)  January-February 2006   a. mLAD - Taxus DES 3.0 x 20; b. (11/06/04) staged PCI to RCA Taxus DES 3.5 x 16; c. 01/27/2017: PCI to mLAD prox to old  stent - Synergy DES 3.0 x 12   TRANSTHORACIC ECHOCARDIOGRAM  10/11/2020    [facility]):  Normal LV size & function. EF 55-60%. NO RWMA. Normal filling parameters.  Mild TR however estimated moderate pulmonary hypertension with RVSP 52 mmHg.  IVC is mildly dilated.  No vegetations noted.  No significant change from last study.   TRANSTHORACIC ECHOCARDIOGRAM  05/2020   [facility]: 05/21/2020: Normal LV size and function.  Normal valves.  No effusion.; 06/04/2020: Normal LV size and function EF 60 to 65%.  Trace MR and TR.  No pulmonary hypertension.  Stable   UMBILICAL HERNIA REPAIR     UMBILICAL HERNIA REPAIR N/A 12/08/2022   Procedure: UMBILICAL HERNIA REPAIR WITH MESH;  Surgeon: Belinda Cough, MD;  Location: [facility];  Service: General;  Laterality: N/A;    Allergies: Latex  Medications: Prior to Admission medications   Medication Sig Start Date End Date Taking? Authorizing Provider  ACCU-CHEK GUIDE TEST test strip 1 EACH BY IN VITRO ROUTE IN THE MORNING, AT NOON, AND AT BEDTIME. MAY SUBSTITUTE TO ANY MANUFACTURER COVERED BY PATIENT'S INSURANCE. 04/26/24   Katrinka Garnette KIDD, MD  Accu-Chek Softclix Lancets lancets 1 EACH BY DOES NOT APPLY ROUTE IN THE MORNING, AT NOON, AND AT BEDTIME. MAY SUBSTITUTE TO ANY MANUFACTURER COVERED BY PATIENT'S INSURANCE. 04/26/24   Katrinka Garnette KIDD, MD  acetaminophen  (TYLENOL ) 500 MG tablet Take 500 mg by mouth 3 (three) times daily as needed for moderate pain (pain score 4-6) or headache.    [provider]  aspirin  EC 81 MG tablet Take 81 mg by mouth daily. Swallow whole.    [provider]  Blood Glucose Monitoring Suppl DEVI 1 each by Does not  apply route in the morning, at noon, and at bedtime. May substitute to any manufacturer covered by patient's insurance. 02/21/24   Katrinka Garnette KIDD, MD  Cholecalciferol (VITAMIN D3) 1000 units CAPS Take 1,000 Units by mouth at bedtime.    [provider]  clopidogrel  (PLAVIX ) 75 MG tablet Take 1 tablet (75 mg total) by mouth daily. TAKE 1 TABLET(75 MG) BY MOUTH DAILY 09/20/23   Clay Alm LELON, MD  cyanocobalamin  (VITAMIN B12) 1000 MCG tablet Take 1,000 mcg by mouth daily.    [provider]  dexamethasone  (DECADRON ) 4 MG tablet TAKE 5 TABS (20 MG) WEEKLY THE DAY OF DARATUMUMAB . TAKE WITH BREAKFAST. 09/15/23   Timmy Maude SAUNDERS, MD  diltiazem  (TIADYLT  ER) 120 MG 24 hr capsule Take 1 capsule (120 mg total) by mouth daily. 03/29/24   Clay Alm LELON, MD  diphenhydrAMINE  (BENADRYL  ALLERGY) 25 MG tablet Take 50 mg by mouth See admin instructions.  Take 50 mg by mouth one hour prior to Daratumumab  infusion on Mondays    [provider]  Docusate Sodium  (DSS) 100 MG CAPS Take 100 mg by mouth daily. 09/23/22   [provider]  famciclovir  (FAMVIR ) 250 MG tablet TAKE 1 TABLET BY MOUTH EVERY DAY 10/05/23   Ennever, Peter R, MD  fluticasone (FLONASE) 50 MCG/ACT nasal spray Place 1 spray into both nostrils daily as needed for allergies or rhinitis.    [provider]  furosemide  (LASIX ) 20 MG tablet Take 1 tablet (20 mg total) by mouth daily as needed. 03/15/24   Anner Alm ORN, MD  loratadine (CLARITIN) 10 MG tablet Take 10 mg by mouth in the morning.    [provider]  MAGNESIUM PO Take 1 tablet by mouth daily.    [provider]  meclizine  (ANTIVERT ) 25 MG tablet Take 1 tablet (25 mg total) by mouth 3 (three) times daily as needed for dizziness. 11/18/23   Timmy Maude SAUNDERS, MD  montelukast  (SINGULAIR ) 10 MG tablet Take 10 mg by mouth at bedtime. 11/26/23   [provider]  nitroGLYCERIN  (NITROSTAT ) 0.4 MG SL tablet Place 1 tablet (0.4 mg  total) under the tongue every 5 (five) minutes as needed. 04/17/24   Henry Manuelita NOVAK, NP  pantoprazole  (PROTONIX ) 40 MG tablet TAKE 1 TABLET BY MOUTH EVERY DAY 09/06/23   Katrinka Garnette KIDD, MD  polyethylene glycol (MIRALAX  / GLYCOLAX ) 17 g packet Take 17 g by mouth daily as needed for moderate constipation.    [provider]  REFRESH PLUS 0.5 % SOLN Place 1 drop into both eyes 3 (three) times daily as needed (for dryness).    [provider]  rosuvastatin  (CRESTOR ) 20 MG tablet TAKE 1 TABLET BY MOUTH EVERY DAY 07/16/23   Katrinka Garnette KIDD, MD  tamsulosin  (FLOMAX ) 0.4 MG CAPS capsule TAKE 1 CAPSULE BY MOUTH EVERYDAY AT BEDTIME 03/15/24   Katrinka Garnette KIDD, MD     Family History  Problem Relation Age of Onset   Uterine cancer Mother    Multiple myeloma Mother    Heart disease Mother    Prostate cancer Father    Cancer Father        sarcoma   Macular degeneration Father    Heart attack Brother    Glaucoma Paternal Aunt    Colon cancer Neg Hx    Esophageal cancer Neg Hx    Rectal cancer Neg Hx    Stomach cancer Neg Hx     Social History   Socioeconomic History   Marital status: Married    Spouse name: Not on file   Number of children: 1   Years of education: Not on file   Highest education level: Not on file  Occupational History   Occupation: Copywriter, advertising: Retired  Tobacco Use   Smoking status: Never   Smokeless tobacco: Never  Vaping Use   Vaping status: Never Used  Substance and Sexual Activity   Alcohol use: No   Drug use: No   Sexual activity: Not Currently  Other Topics Concern   Not on file  Social History Narrative   Married 63 years in 2023. 1 child Conne in [place].  1 grandchild in [place].       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 9 hours a day.       Hobbies: work, singing in church, some guitar (owns several guitars)  Social Drivers of Corporate investment banker Strain: Low Risk   (10/07/2023)   Overall Financial Resource Strain (CARDIA)    Difficulty of Paying Living Expenses: Not very hard  Food Insecurity: No Food Insecurity (10/07/2023)   Hunger Vital Sign    Worried About Running Out of Food in the Last Year: Never true    Ran Out of Food in the Last Year: Never true  Transportation Needs: No Transportation Needs (10/07/2023)   PRAPARE - Administrator, Civil Service (Medical): No    Lack of Transportation (Non-Medical): No  Physical Activity: Insufficiently Active (10/07/2023)   Exercise Vital Sign    Days of Exercise per Week: 2 days    Minutes of Exercise per Session: 50 min  Stress: No Stress Concern Present (10/07/2023)   [employer] of Occupational Health - Occupational Stress Questionnaire    Feeling of Stress : Only a little  Social Connections: Socially Integrated (10/07/2023)   Social Connection and Isolation Panel    Frequency of Communication with Friends and Family: More than three times a week    Frequency of Social Gatherings with Friends and Family: Three times a week    Attends Religious Services: More than 4 times per year    Active Member of Clubs or Organizations: Yes    Attends Banker Meetings: More than 4 times per year    Marital Status: Married       Review of Systems: denies fever,HA,CP,dyspnea, cough,abd/back pain,N/V or bleeding  Vital Signs: Vitals:   06/13/24 0700  BP: 125/76  Pulse: 66  Resp: 16  Temp: 97.7 F (36.5 C)  SpO2: 98%      Advance Care Plan: No documents on file.  Physical Exam: awake/alert; chest- CTA bilat; heart- RRR; abd-soft,+BS,NT; no LE edema  Imaging: No results found.  Labs:  CBC: Recent Labs    04/20/24 0928 05/04/24 0931 05/18/24 1025 06/01/24 0921  WBC 9.0 8.7 9.1 6.9  HGB 12.2* 11.6* 12.8* 12.4*  HCT 35.6* 34.9* 38.1* 37.2*  PLT 156 129* 144* 142*    COAGS: No results for input(s): INR, APTT in the last 8760 hours.  BMP: Recent Labs     04/20/24 0928 05/04/24 0931 05/18/24 1025 06/01/24 0921  NA 140 140 141 141  K 4.1 4.2 4.8 4.4  CL 107 107 105 106  CO2 25 22 25 25   GLUCOSE 205* 157* 170* 132*  BUN 25* 21 22 27*  CALCIUM  9.5 9.0 9.7 9.4  CREATININE 1.24 1.02 1.19 1.17  GFRNONAA 58* >60 >60 >60    LIVER FUNCTION TESTS: Recent Labs    04/20/24 0928 05/04/24 0931 05/18/24 1025 06/01/24 0921  BILITOT 0.5 0.4 0.4 0.3  AST 14* 19 19 20   ALT 11 15 13 14   ALKPHOS 61 59 62 65  PROT 6.0* 5.8* 6.2* 6.0*  ALBUMIN 4.2 4.0 4.3 4.3    TUMOR MARKERS: No results for input(s): AFPTM, CEA, CA199, CHROMGRNA in the last 8760 hours.  Assessment and Plan: 83 y.o. male with past medical history significant for coronary artery disease with prior MI/stenting, arthritis, Barrett's esophagus, skin cancer, diverticulosis, esophageal stricture, GERD, hiatal hernia, hyperlipidemia, implantable loop recorder , paroxysmal VT, IgG kappa myeloma, anemia.  He presents today for image guided bone marrow biopsy to assess treatment response of myeloma.Risks and benefits of procedure was discussed with the patient/spouse including, but not limited to bleeding, infection, damage to adjacent structures or low yield requiring additional tests.  All  of the questions were answered and there is agreement to proceed.  Consent signed and in chart.  Patient known to IR team from prior bone marrow biopsies in 2023 and 2024.  Thank you for allowing our service to participate in Guadalupe Nickless 's care.  Electronically Signed: D. Franky Rakers, PA-C   06/11/2024, 12:22 PM      I spent a total of   20 minutes  in face to face in clinical consultation, greater than 50% of which was counseling/coordinating care for image guided bone marrow biopsy

## 2024-06-13 ENCOUNTER — Ambulatory Visit (HOSPITAL_COMMUNITY)
Admission: RE | Admit: 2024-06-13 | Discharge: 2024-06-13 | Disposition: A | Discharge status: Home / Self Care | Source: Ambulatory Visit | Attending: Hematology & Oncology | Admitting: Hematology & Oncology

## 2024-06-13 ENCOUNTER — Encounter (HOSPITAL_COMMUNITY): Payer: Self-pay

## 2024-06-13 ENCOUNTER — Other Ambulatory Visit: Payer: Self-pay

## 2024-06-13 VITALS — BP 130/67 | HR 61 | Temp 97.7°F | Resp 16 | Ht 71.0 in | Wt 197.0 lb

## 2024-06-13 DIAGNOSIS — I252 Old myocardial infarction: Secondary | ICD-10-CM | POA: Insufficient documentation

## 2024-06-13 DIAGNOSIS — C9001 Multiple myeloma in remission: Secondary | ICD-10-CM | POA: Insufficient documentation

## 2024-06-13 DIAGNOSIS — E785 Hyperlipidemia, unspecified: Secondary | ICD-10-CM | POA: Insufficient documentation

## 2024-06-13 DIAGNOSIS — Z85828 Personal history of other malignant neoplasm of skin: Secondary | ICD-10-CM | POA: Insufficient documentation

## 2024-06-13 DIAGNOSIS — I2510 Atherosclerotic heart disease of native coronary artery without angina pectoris: Secondary | ICD-10-CM | POA: Diagnosis not present

## 2024-06-13 DIAGNOSIS — Z955 Presence of coronary angioplasty implant and graft: Secondary | ICD-10-CM | POA: Insufficient documentation

## 2024-06-13 DIAGNOSIS — Z1379 Encounter for other screening for genetic and chromosomal anomalies: Secondary | ICD-10-CM | POA: Diagnosis not present

## 2024-06-13 DIAGNOSIS — C9000 Multiple myeloma not having achieved remission: Secondary | ICD-10-CM

## 2024-06-13 DIAGNOSIS — D649 Anemia, unspecified: Secondary | ICD-10-CM | POA: Insufficient documentation

## 2024-06-13 LAB — CBC WITH DIFFERENTIAL/PLATELET
Abs Immature Granulocytes: 0.02 K/uL (ref 0.00–0.07)
Basophils Absolute: 0.1 K/uL (ref 0.0–0.1)
Basophils Relative: 1 %
Eosinophils Absolute: 0.2 K/uL (ref 0.0–0.5)
Eosinophils Relative: 2 %
HCT: 37.1 % — ABNORMAL LOW (ref 39.0–52.0)
Hemoglobin: 12.3 g/dL — ABNORMAL LOW (ref 13.0–17.0)
Immature Granulocytes: 0 %
Lymphocytes Relative: 19 %
Lymphs Abs: 1.4 K/uL (ref 0.7–4.0)
MCH: 32.3 pg (ref 26.0–34.0)
MCHC: 33.2 g/dL (ref 30.0–36.0)
MCV: 97.4 fL (ref 80.0–100.0)
Monocytes Absolute: 0.8 K/uL (ref 0.1–1.0)
Monocytes Relative: 11 %
Neutro Abs: 4.7 K/uL (ref 1.7–7.7)
Neutrophils Relative %: 67 %
Platelets: 171 K/uL (ref 150–400)
RBC: 3.81 MIL/uL — ABNORMAL LOW (ref 4.22–5.81)
RDW: 12.7 % (ref 11.5–15.5)
WBC: 7.1 K/uL (ref 4.0–10.5)
nRBC: 0 % (ref 0.0–0.2)

## 2024-06-13 LAB — GLUCOSE, CAPILLARY: Glucose-Capillary: 134 mg/dL — ABNORMAL HIGH (ref 70–99)

## 2024-06-13 MED ORDER — MIDAZOLAM HCL 2 MG/2ML IJ SOLN
INTRAMUSCULAR | Status: AC | PRN
  Administered 2024-06-13: .5 mg via INTRAVENOUS

## 2024-06-13 MED ORDER — SODIUM CHLORIDE 0.9 % IV SOLN
INTRAVENOUS | Status: DC
Start: 2024-06-13 — End: 2024-06-14

## 2024-06-13 MED ORDER — NALOXONE HCL 0.4 MG/ML IJ SOLN
INTRAMUSCULAR | Status: AC
  Filled 2024-06-13: qty 1

## 2024-06-13 MED ORDER — FENTANYL CITRATE (PF) 100 MCG/2ML IJ SOLN
INTRAMUSCULAR | Status: AC
  Filled 2024-06-13: qty 2

## 2024-06-13 MED ORDER — FLUMAZENIL 0.5 MG/5ML IV SOLN
INTRAVENOUS | Status: AC
  Filled 2024-06-13: qty 5

## 2024-06-13 MED ORDER — MIDAZOLAM HCL 2 MG/2ML IJ SOLN
INTRAMUSCULAR | Status: AC
  Filled 2024-06-13: qty 2

## 2024-06-13 MED ORDER — FENTANYL CITRATE (PF) 100 MCG/2ML IJ SOLN
INTRAMUSCULAR | Status: AC | PRN
  Administered 2024-06-13: 25 ug via INTRAVENOUS

## 2024-06-13 NOTE — Discharge Instructions (Signed)
 PLEASE CALL INTERVENTIONAL RADIOLOGY CLINIC [phone] WITH ANY QUESTION OR CONCERNS   YOU MAY REMOVE YOUR DRESSING AND SHOWER TOMORROW

## 2024-06-13 NOTE — Procedures (Signed)
Interventional Radiology Procedure Note  Procedure: CT BM ASP AND CORE    Complications: None  Estimated Blood Loss:  MIN  Findings: 11 G CORE AND ASP    M. TREVOR Chrstopher Malenfant, MD    

## 2024-06-15 ENCOUNTER — Inpatient Hospital Stay

## 2024-06-15 ENCOUNTER — Ambulatory Visit: Payer: Self-pay | Admitting: Hematology & Oncology

## 2024-06-15 LAB — SURGICAL PATHOLOGY

## 2024-06-19 ENCOUNTER — Ambulatory Visit: Admitting: Family Medicine

## 2024-06-20 ENCOUNTER — Ambulatory Visit (INDEPENDENT_AMBULATORY_CARE_PROVIDER_SITE_OTHER): Admitting: Family Medicine

## 2024-06-20 ENCOUNTER — Encounter: Payer: Self-pay | Admitting: Family Medicine

## 2024-06-20 VITALS — BP 100/68 | HR 83 | Temp 98.1°F | Ht 71.0 in | Wt 193.6 lb

## 2024-06-20 DIAGNOSIS — E1169 Type 2 diabetes mellitus with other specified complication: Secondary | ICD-10-CM

## 2024-06-20 DIAGNOSIS — C9001 Multiple myeloma in remission: Secondary | ICD-10-CM

## 2024-06-20 DIAGNOSIS — I25119 Atherosclerotic heart disease of native coronary artery with unspecified angina pectoris: Secondary | ICD-10-CM

## 2024-06-20 DIAGNOSIS — E785 Hyperlipidemia, unspecified: Secondary | ICD-10-CM | POA: Diagnosis not present

## 2024-06-20 DIAGNOSIS — E119 Type 2 diabetes mellitus without complications: Secondary | ICD-10-CM

## 2024-06-20 NOTE — Patient Instructions (Addendum)
 Please stop by lab before you go If you have mychart- we will send your results within 3 business days of us  receiving them.  If you do not have mychart- we will call you about results within 5 business days of us  receiving them.  *please also note that you will see labs on mychart as soon as they post. I will later go in and write notes on them- will say notes from Dr. Katrinka   No changes today unless labs lead us  to make changes - considering Zetia for cholesterol -consider jardiance if a1c not improving -we understand your preference is to minimize of course so will send suggestions  Recommended follow up: Return in about 4 months (around 10/20/2024) for followup or sooner if needed.Schedule b4 you leave.

## 2024-06-20 NOTE — Progress Notes (Signed)
 Phone [phone] In person visit   Subjective:   Alexander Armstrong is a 83 y.o. year old very pleasant male patient who presents for/with See problem oriented charting Chief Complaint  Patient presents with   Diabetes   Past Medical History-  Patient Active Problem List   Diagnosis Date Noted   Partial small bowel obstruction (HCC) 09/21/2022    Priority: High   Multiple myeloma (HCC) 08/13/2022    Priority: High   Wide-complex tachycardia - WCT (HCC) 12/09/2016    Priority: High   Cardiac syncope 03/10/2016    Priority: High   Cardiac device in situ 11/18/2015    Priority: High   Type 2 diabetes mellitus without complication, without long-term current use of insulin (HCC) 11/18/2015    Priority: High   Paroxysmal VT (HCC) 09/17/2015    Priority: High   History of acute anterior wall MI 01/26/2008    Priority: High   Coronary artery disease involving native coronary artery of native heart with angina pectoris (HCC) 11/03/2004    Priority: High   Vitamin D  deficiency 12/23/2022    Priority: Medium    Hypotension 02/22/2018    Priority: Medium    BPH associated with nocturia 11/18/2015    Priority: Medium    Gastroesophageal reflux disease without esophagitis 11/18/2015    Priority: Medium    Hyperlipidemia associated with type 2 diabetes mellitus (HCC) 01/26/2008    Priority: Medium    ESOPHAGEAL STRICTURE 12/26/2007    Priority: Medium    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    Priority: Medium    Exercise intolerance 02/20/2019    Priority: Low   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   Other acquired hammer toe 05/27/2017    Priority: 1.   Acquired deformity of toe 05/27/2017    Priority: 1.   Acute right-sided low back pain without sciatica 11/18/2015    Priority: 1.   Ingrown nail 11/18/2015    Priority: 1.   Lower extremity edema 03/16/2024   Atypical angina (HCC) 03/15/2024   Antineoplastic chemotherapy induced anemia 12/16/2023   Chemotherapy-induced thrombocytopenia 12/16/2023   Chest pain 07/20/2023   MRSA bacteremia 11/05/2020   Pneumonia of both lower lobes due to methicillin resistant Staphylococcus aureus (MRSA) (HCC) 11/05/2020   Chronic respiratory insufficiency 10/16/2020   Generalized weakness 05/17/2020   Hyponatremia with decreased serum osmolality 05/17/2020   Thrombocytopenia (HCC) 05/17/2020   Reflux gastritis 12/26/2007    Medications- reviewed and updated Current Outpatient Medications  Medication Sig Dispense Refill   ACCU-CHEK GUIDE TEST test strip 1 EACH BY IN VITRO ROUTE IN THE MORNING, AT NOON, AND AT BEDTIME. MAY SUBSTITUTE TO ANY MANUFACTURER COVERED BY PATIENT'S INSURANCE. 100 strip 0   Accu-Chek Softclix Lancets lancets 1 EACH BY DOES NOT APPLY ROUTE IN THE MORNING, AT NOON, AND AT BEDTIME. MAY SUBSTITUTE TO ANY MANUFACTURER COVERED BY PATIENT'S INSURANCE. 100 each 3   acetaminophen  (  TYLENOL ) 500 MG tablet Take 500 mg by mouth 3 (three) times daily as needed for moderate pain (pain score 4-6) or headache.     aspirin  EC 81 MG tablet Take 81 mg by mouth daily. Swallow whole.     Blood Glucose Monitoring Suppl DEVI 1 each by Does not apply route in the morning, at noon, and at bedtime. May substitute to any manufacturer covered by patient's insurance. 1 each 0   Cholecalciferol (VITAMIN D3) 1000 units CAPS Take 1,000 Units by mouth at bedtime.     clopidogrel  (PLAVIX ) 75 MG tablet Take 1 tablet  (75 mg total) by mouth daily. TAKE 1 TABLET(75 MG) BY MOUTH DAILY 90 tablet 3   cyanocobalamin  (VITAMIN B12) 1000 MCG tablet Take 1,000 mcg by mouth daily.     dexamethasone  (DECADRON ) 4 MG tablet TAKE 5 TABS (20 MG) WEEKLY THE DAY OF DARATUMUMAB . TAKE WITH BREAKFAST. 60 tablet 5   diltiazem  (TIADYLT  ER) 120 MG 24 hr capsule Take 1 capsule (120 mg total) by mouth daily. 90 capsule 3   diphenhydrAMINE  (BENADRYL  ALLERGY) 25 MG tablet Take 50 mg by mouth See admin instructions. Take 50 mg by mouth one hour prior to Daratumumab  infusion on Mondays     Docusate Sodium  (DSS) 100 MG CAPS Take 100 mg by mouth daily.     famciclovir  (FAMVIR ) 250 MG tablet TAKE 1 TABLET BY MOUTH EVERY DAY 90 tablet 2   fluticasone (FLONASE) 50 MCG/ACT nasal spray Place 1 spray into both nostrils daily as needed for allergies or rhinitis.     furosemide  (LASIX ) 20 MG tablet Take 1 tablet (20 mg total) by mouth daily as needed. 30 tablet 11   loratadine (CLARITIN) 10 MG tablet Take 10 mg by mouth in the morning.     MAGNESIUM PO Take 1 tablet by mouth daily.     meclizine  (ANTIVERT ) 25 MG tablet Take 1 tablet (25 mg total) by mouth 3 (three) times daily as needed for dizziness. 30 tablet 5   montelukast  (SINGULAIR ) 10 MG tablet Take 10 mg by mouth at bedtime.     nitroGLYCERIN  (NITROSTAT ) 0.4 MG SL tablet Place 1 tablet (0.4 mg total) under the tongue every 5 (five) minutes as needed. 25 tablet 2   pantoprazole  (PROTONIX ) 40 MG tablet TAKE 1 TABLET BY MOUTH EVERY DAY 90 tablet 3   polyethylene glycol (MIRALAX  / GLYCOLAX ) 17 g packet Take 17 g by mouth daily as needed for moderate constipation.     REFRESH PLUS 0.5 % SOLN Place 1 drop into both eyes 3 (three) times daily as needed (for dryness).     rosuvastatin  (CRESTOR ) 20 MG tablet TAKE 1 TABLET BY MOUTH EVERY DAY 90 tablet 3   tamsulosin  (FLOMAX ) 0.4 MG CAPS capsule TAKE 1 CAPSULE BY MOUTH EVERYDAY AT BEDTIME 90 capsule 3   No current facility-administered medications for  this visit.     Objective:  BP 100/68 (BP Location: Left Arm, Patient Position: Sitting, Cuff Size: Normal)   Pulse 83   Temp 98.1 F (36.7 C) (Temporal)   Ht 5' 11 (1.803 m)   Wt 193 lb 9.6 oz (87.8 kg)   SpO2 98%   BMI 27.00 kg/m  Gen: NAD, resting comfortably CV: RRR no murmurs rubs or gallops Lungs: CTAB no crackles, wheeze, rhonchi Abdomen: soft/nontender/nondistended/normal bowel sounds. No rebound or guarding.  Ext: trace edema, 1+ just over edge of sockline Skin: warm, dry Neuro: grossly normal, moves all extremities  Diabetic foot exam was  performed with the following findings:   No deformities, ulcerations, or other skin breakdown Normal sensation of 10g monofilament Intact posterior tibialis and dorsalis pedis pulses        Assessment and Plan   # Multiple myeloma-follows with Dr. Timmy S: Patient reports finishing treatment and being told in remission in September 2025 after bone marrow biopsy!  A/P: congratulated ptaient on excellent progress  #CAD-follows with Dr. Anner with history of MI and PCI #hyperlipidemia-LDL goal under 70 #Palpitations-as needed diltiazem  60 mg. also 120 mg extended release  S: Medication:Plavix  75 mg, rosuvastatin  20 mg -no chest pain or shortness of breath  Lab Results  Component Value Date   CHOL 142 12/23/2022   HDL 41.60 12/23/2022   LDLCALC 78 12/23/2022   TRIG 116.0 12/23/2022   CHOLHDL 3 12/23/2022  A/P: doing well since recent stent. Hoping lipids improved- update today and continue Plavix  and rosuvastatin  for now Palpitations at bay with diltiazem    # Diabetes S: Medication: diet controlled in past- poor control/worsening last visit but around time of chemo CBGs- noted sugar higher around chemotherapy- would spike then or next day- #s looking better lately with morning sugars 120s or 130s and PM readings 120s to 160's.  Exercise and diet- has attempted to improve diet Lab Results  Component Value Date    HGBA1C 7.7 (A) 02/17/2024   HGBA1C 7.2 (A) 10/14/2023   HGBA1C 6.3 (A) 04/01/2023   A/P: suspect improving control. Consider jardiance if a1c still above 7 but hed really like to avoid more medicine- we discussed potential cardiac benefit  # GERD with history of Barrett's esophagus- since no dysplasia on current biopsies on endoscopy 2021, no repeat upper endoscopy for surveillance of Barrett' s esophagus due to age and clinical history. S:Medication:  pantoprazole  40 mg- no issues B12 levels related to PPI use: Low normal in 2013-has had mild chronic anemia and was advised to remain on B12 long-term A/P: well controlled continue current medications    #Vitamin D  deficiency- has been on 1000 units- did not get a chance to add before blood draw- continue current medications and recheck next labs with me    #Overactive bladder/BPH S: medication: tamsulosin  0.4 mg A/P: well controlled continue current medications   #ongoing dizziness/balance issues- no worse  Recommended follow up: No follow-ups on file. Future Appointments  Date Time Provider Department Center  07/31/2024  9:30 AM [facility] None  07/31/2024  9:45 AM Ennever, Maude SAUNDERS, MD [facility] None    Lab/Order associations: breakfast honey nut cheerios and cup of milk   ICD-10-CM   1. Type 2 diabetes mellitus without complication, without long-term current use of insulin (HCC)  E11.9 Comprehensive metabolic panel with GFR    CBC with Differential/Platelet    Lipid panel    Hemoglobin A1c    2. Coronary artery disease involving native coronary artery of native heart with angina pectoris (HCC)  I25.119 Comprehensive metabolic panel with GFR    CBC with Differential/Platelet    Lipid panel    3. Multiple myeloma in remission (HCC)  C90.01     4. Hyperlipidemia associated with type 2 diabetes mellitus (HCC)  E11.69    E78.5       No orders of the defined types were placed in this encounter.   Return precautions  advised.  Garnette Lukes, MD

## 2024-06-21 ENCOUNTER — Ambulatory Visit: Payer: Self-pay | Admitting: Family Medicine

## 2024-06-21 ENCOUNTER — Other Ambulatory Visit: Payer: Self-pay

## 2024-06-21 ENCOUNTER — Encounter: Payer: Self-pay | Admitting: Hematology & Oncology

## 2024-06-21 LAB — COMPREHENSIVE METABOLIC PANEL WITH GFR
ALT: 14 U/L (ref 0–53)
AST: 18 U/L (ref 0–37)
Albumin: 4.3 g/dL (ref 3.5–5.2)
Alkaline Phosphatase: 50 U/L (ref 39–117)
BUN: 24 mg/dL — ABNORMAL HIGH (ref 6–23)
CO2: 28 meq/L (ref 19–32)
Calcium: 9.6 mg/dL (ref 8.4–10.5)
Chloride: 104 meq/L (ref 96–112)
Creatinine, Ser: 1.2 mg/dL (ref 0.40–1.50)
GFR: 56.09 mL/min — ABNORMAL LOW (ref >60.00)
Glucose, Bld: 110 mg/dL — ABNORMAL HIGH (ref 70–99)
Potassium: 4.6 meq/L (ref 3.5–5.1)
Sodium: 139 meq/L (ref 135–145)
Total Bilirubin: 0.6 mg/dL (ref 0.2–1.2)
Total Protein: 6.1 g/dL (ref 6.0–8.3)

## 2024-06-21 LAB — LIPID PANEL
Cholesterol: 131 mg/dL (ref 0–200)
HDL: 35.1 mg/dL — ABNORMAL LOW (ref >39.00)
LDL Cholesterol: 69 mg/dL (ref 0–99)
NonHDL: 96.01
Total CHOL/HDL Ratio: 4
Triglycerides: 136 mg/dL (ref 0.0–149.0)
VLDL: 27.2 mg/dL (ref 0.0–40.0)

## 2024-06-21 LAB — CBC WITH DIFFERENTIAL/PLATELET
Basophils Absolute: 0.1 K/uL (ref 0.0–0.1)
Basophils Relative: 0.9 % (ref 0.0–3.0)
Eosinophils Absolute: 0.1 K/uL (ref 0.0–0.7)
Eosinophils Relative: 1.7 % (ref 0.0–5.0)
HCT: 37.1 % — ABNORMAL LOW (ref 39.0–52.0)
Hemoglobin: 12.5 g/dL — ABNORMAL LOW (ref 13.0–17.0)
Lymphocytes Relative: 22.1 % (ref 12.0–46.0)
Lymphs Abs: 1.4 K/uL (ref 0.7–4.0)
MCHC: 33.8 g/dL (ref 30.0–36.0)
MCV: 95.7 fl (ref 78.0–100.0)
Monocytes Absolute: 0.7 K/uL (ref 0.1–1.0)
Monocytes Relative: 10.6 % (ref 3.0–12.0)
Neutro Abs: 4.2 K/uL (ref 1.4–7.7)
Neutrophils Relative %: 64.7 % (ref 43.0–77.0)
Platelets: 146 K/uL — ABNORMAL LOW (ref 150.0–400.0)
RBC: 3.88 Mil/uL — ABNORMAL LOW (ref 4.22–5.81)
RDW: 13.1 % (ref 11.5–15.5)
WBC: 6.5 K/uL (ref 4.0–10.5)

## 2024-06-21 LAB — HEMOGLOBIN A1C: Hgb A1c MFr Bld: 7.7 % — ABNORMAL HIGH (ref 4.6–6.5)

## 2024-06-23 ENCOUNTER — Encounter (HOSPITAL_COMMUNITY): Payer: Self-pay | Admitting: Hematology & Oncology

## 2024-06-23 ENCOUNTER — Other Ambulatory Visit: Payer: Self-pay | Admitting: Family Medicine

## 2024-06-29 ENCOUNTER — Inpatient Hospital Stay

## 2024-06-29 ENCOUNTER — Inpatient Hospital Stay: Admitting: Hematology & Oncology

## 2024-07-31 ENCOUNTER — Inpatient Hospital Stay (HOSPITAL_BASED_OUTPATIENT_CLINIC_OR_DEPARTMENT_OTHER): Admitting: Hematology & Oncology

## 2024-07-31 ENCOUNTER — Encounter: Payer: Self-pay | Admitting: Hematology & Oncology

## 2024-07-31 ENCOUNTER — Inpatient Hospital Stay: Attending: Hematology & Oncology

## 2024-07-31 VITALS — BP 109/69 | HR 85 | Temp 97.6°F | Resp 20 | Ht 71.0 in | Wt 188.8 lb

## 2024-07-31 DIAGNOSIS — C9000 Multiple myeloma not having achieved remission: Secondary | ICD-10-CM | POA: Diagnosis present

## 2024-07-31 DIAGNOSIS — E860 Dehydration: Secondary | ICD-10-CM | POA: Insufficient documentation

## 2024-07-31 DIAGNOSIS — R197 Diarrhea, unspecified: Secondary | ICD-10-CM | POA: Diagnosis not present

## 2024-07-31 DIAGNOSIS — R1110 Vomiting, unspecified: Secondary | ICD-10-CM | POA: Insufficient documentation

## 2024-07-31 DIAGNOSIS — C9001 Multiple myeloma in remission: Secondary | ICD-10-CM

## 2024-07-31 DIAGNOSIS — D630 Anemia in neoplastic disease: Secondary | ICD-10-CM | POA: Diagnosis not present

## 2024-07-31 LAB — CBC WITH DIFFERENTIAL (CANCER CENTER ONLY)
Abs Immature Granulocytes: 0.03 K/uL (ref 0.00–0.07)
Basophils Absolute: 0 K/uL (ref 0.0–0.1)
Basophils Relative: 0 %
Eosinophils Absolute: 0.1 K/uL (ref 0.0–0.5)
Eosinophils Relative: 1 %
HCT: 38.7 % — ABNORMAL LOW (ref 39.0–52.0)
Hemoglobin: 13 g/dL (ref 13.0–17.0)
Immature Granulocytes: 0 %
Lymphocytes Relative: 15 %
Lymphs Abs: 1.4 K/uL (ref 0.7–4.0)
MCH: 32.3 pg (ref 26.0–34.0)
MCHC: 33.6 g/dL (ref 30.0–36.0)
MCV: 96 fL (ref 80.0–100.0)
Monocytes Absolute: 0.8 K/uL (ref 0.1–1.0)
Monocytes Relative: 9 %
Neutro Abs: 7.1 K/uL (ref 1.7–7.7)
Neutrophils Relative %: 75 %
Platelet Count: 159 K/uL (ref 150–400)
RBC: 4.03 MIL/uL — ABNORMAL LOW (ref 4.22–5.81)
RDW: 12.5 % (ref 11.5–15.5)
WBC Count: 9.4 K/uL (ref 4.0–10.5)
nRBC: 0 % (ref 0.0–0.2)

## 2024-07-31 LAB — CMP (CANCER CENTER ONLY)
ALT: 15 U/L (ref 0–44)
AST: 22 U/L (ref 15–41)
Albumin: 4.4 g/dL (ref 3.5–5.0)
Alkaline Phosphatase: 63 U/L (ref 38–126)
Anion gap: 11 (ref 5–15)
BUN: 23 mg/dL (ref 8–23)
CO2: 26 mmol/L (ref 22–32)
Calcium: 9.9 mg/dL (ref 8.9–10.3)
Chloride: 105 mmol/L (ref 98–111)
Creatinine: 1.26 mg/dL — ABNORMAL HIGH (ref 0.61–1.24)
GFR, Estimated: 57 mL/min — ABNORMAL LOW (ref >60)
Glucose, Bld: 186 mg/dL — ABNORMAL HIGH (ref 70–99)
Potassium: 4.7 mmol/L (ref 3.5–5.1)
Sodium: 141 mmol/L (ref 135–145)
Total Bilirubin: 0.5 mg/dL (ref 0.0–1.2)
Total Protein: 6.2 g/dL — ABNORMAL LOW (ref 6.5–8.1)

## 2024-07-31 LAB — LACTATE DEHYDROGENASE: LDH: 158 U/L (ref 98–192)

## 2024-07-31 NOTE — Progress Notes (Signed)
 Hematology and Oncology Follow Up Visit  Alexander Armstrong [id number] [birth year] 83 y.o. 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 --Start on 10/29/2022     Interim History:  [name] is back for follow-up.  Thankfully, he is doing okay right now.  Unfortunately, his poor wife is not doing well.  She was in the hospital.  She was incredibly dehydrated.  She is still having problems with diarrhea and vomiting.  She was discharged.  I am not sure as to what might be going on with her.  I told him that she probably needs to see her Gastroenterologist.  Again he is complete all of his treatments.  His last monoclonal spike was 0.1 g/dL.  His IgG level was 484 mg/dL.  His kappa light chain was 0.9 mg/dL.  He had a bone marrow biopsy on 06/13/2024.  The bone marrow report ([id number]) showed a normal bone marrow.  There is only 1% plasma cells.  Everything looks fantastic.  Again, I want to hold off on treatment right now.  He is doing okay.  He has had no chest pain.  He has had no cough or shortness of breath.  There has been no change in bowel or bladder habits.  He has had no bleeding.  There has been no leg swelling.  He has had no fever.  Overall, I would say that his performance status is probably ECOG 0.   He has had no swollen lymph nodes.  He has had no rashes.  He has had no leg swelling.  Overall, I would say that his performance status is probably ECOG 1.   Wt Readings from Last 3 Encounters:  07/31/24 188 lb 12.8 oz (85.6 kg)  06/20/24 193 lb 9.6 oz (87.8 kg)  06/13/24 197 lb (89.4 kg)     Medications:  Current Outpatient  Medications:    ACCU-CHEK GUIDE TEST test strip, 1 EACH BY IN VITRO ROUTE IN THE MORNING, AT NOON, AND AT BEDTIME. MAY SUBSTITUTE TO ANY MANUFACTURER COVERED BY PATIENT'S INSURANCE., Disp: 100 strip, Rfl: 0   Accu-Chek Softclix Lancets lancets, 1 EACH BY DOES NOT APPLY ROUTE IN THE MORNING, AT NOON, AND AT BEDTIME. MAY SUBSTITUTE TO ANY MANUFACTURER COVERED BY PATIENT'S INSURANCE., Disp: 100 each, Rfl: 3   acetaminophen  (TYLENOL ) 500 MG tablet, Take 500 mg by mouth 3 (three) times daily as needed for moderate pain (pain score 4-6) or headache., Disp: , Rfl:    aspirin  EC 81 MG tablet, Take 81 mg by mouth daily. Swallow whole., Disp: , Rfl:    Blood Glucose Monitoring Suppl DEVI, 1 each by Does not apply route in the morning, at noon, and at bedtime. May substitute to any manufacturer covered by patient's insurance., Disp: 1 each, Rfl: 0   Cholecalciferol (VITAMIN D3) 1000 units CAPS, Take 1,000 Units by mouth at bedtime., Disp: , Rfl:    clopidogrel  (PLAVIX ) 75 MG tablet, Take 1 tablet (75 mg total) by mouth daily. TAKE 1 TABLET(75 MG) BY MOUTH DAILY, Disp: 90 tablet, Rfl: 3   cyanocobalamin  (VITAMIN B12) 1000 MCG tablet, Take 1,000 mcg by mouth daily., Disp: , Rfl:    diltiazem  (TIADYLT  ER) 120 MG 24 hr capsule, Take  1 capsule (120 mg total) by mouth daily., Disp: 90 capsule, Rfl: 3   Docusate Sodium  (DSS) 100 MG CAPS, Take 100 mg by mouth daily., Disp: , Rfl:    famciclovir  (FAMVIR ) 250 MG tablet, TAKE 1 TABLET BY MOUTH EVERY DAY, Disp: 90 tablet, Rfl: 2   fluticasone (FLONASE) 50 MCG/ACT nasal spray, Place 1 spray into both nostrils daily as needed for allergies or rhinitis., Disp: , Rfl:    furosemide  (LASIX ) 20 MG tablet, Take 1 tablet (20 mg total) by mouth daily as needed., Disp: 30 tablet, Rfl: 11   loratadine (CLARITIN) 10 MG tablet, Take 10 mg by mouth in the morning., Disp: , Rfl:    MAGNESIUM PO, Take 1 tablet by mouth daily., Disp: , Rfl:    meclizine  (ANTIVERT ) 25 MG tablet, Take 1  tablet (25 mg total) by mouth 3 (three) times daily as needed for dizziness., Disp: 30 tablet, Rfl: 5   montelukast  (SINGULAIR ) 10 MG tablet, Take 10 mg by mouth at bedtime., Disp: , Rfl:    nitroGLYCERIN  (NITROSTAT ) 0.4 MG SL tablet, Place 1 tablet (0.4 mg total) under the tongue every 5 (five) minutes as needed., Disp: 25 tablet, Rfl: 2   pantoprazole  (PROTONIX ) 40 MG tablet, TAKE 1 TABLET BY MOUTH EVERY DAY, Disp: 90 tablet, Rfl: 3   polyethylene glycol (MIRALAX  / GLYCOLAX ) 17 g packet, Take 17 g by mouth daily as needed for moderate constipation., Disp: , Rfl:    REFRESH PLUS 0.5 % SOLN, Place 1 drop into both eyes 3 (three) times daily as needed (for dryness)., Disp: , Rfl:    rosuvastatin  (CRESTOR ) 20 MG tablet, TAKE 1 TABLET BY MOUTH EVERY DAY, Disp: 90 tablet, Rfl: 3   tamsulosin  (FLOMAX ) 0.4 MG CAPS capsule, TAKE 1 CAPSULE BY MOUTH EVERYDAY AT BEDTIME, Disp: 90 capsule, Rfl: 3   dexamethasone  (DECADRON ) 4 MG tablet, TAKE 5 TABS (20 MG) WEEKLY THE DAY OF DARATUMUMAB . TAKE WITH BREAKFAST. (Patient not taking: Reported on 07/31/2024), Disp: 60 tablet, Rfl: 5   diphenhydrAMINE  (BENADRYL  ALLERGY) 25 MG tablet, Take 50 mg by mouth See admin instructions. Take 50 mg by mouth one hour prior to Daratumumab  infusion on Mondays (Patient not taking: Reported on 07/31/2024), Disp: , Rfl:   Allergies:  Allergies  Allergen Reactions   Latex Other (See Comments)    takes the skin off (tape)    Past Medical History, Surgical history, Social history, and Family History were reviewed and updated.  Review of Systems: Review of Systems  Constitutional: Negative.   HENT:  Negative.    Eyes: Negative.   Respiratory: Negative.    Cardiovascular: Negative.   Gastrointestinal: Negative.   Endocrine: Negative.   Genitourinary: Negative.    Musculoskeletal: Negative.   Skin: Negative.   Neurological: Negative.   Hematological: Negative.   Psychiatric/Behavioral: Negative.      Physical  Exam:  Vitals:   07/31/24 1001  BP: 109/69  Pulse: 85  Resp: 20  Temp: 97.6 F (36.4 C)  SpO2: 99%     Wt Readings from Last 3 Encounters:  07/31/24 188 lb 12.8 oz (85.6 kg)  06/20/24 193 lb 9.6 oz (87.8 kg)  06/13/24 197 lb (89.4 kg)    Physical Exam Vitals reviewed.  HENT:     Head: Normocephalic and atraumatic.  Eyes:     Pupils: Pupils are equal, round, and reactive to light.  Cardiovascular:     Rate and Rhythm: Normal rate and regular rhythm.     Heart  sounds: Normal heart sounds.  Pulmonary:     Effort: Pulmonary effort is normal.     Breath sounds: Normal breath sounds.  Abdominal:     General: Bowel sounds are normal.     Palpations: Abdomen is soft.  Musculoskeletal:        General: No tenderness or deformity. Normal range of motion.     Cervical back: Normal range of motion.  Lymphadenopathy:     Cervical: No cervical adenopathy.  Skin:    General: Skin is warm and dry.     Findings: No erythema or rash.  Neurological:     Mental Status: He is alert and oriented to person, place, and time.  Psychiatric:        Behavior: Behavior normal.        Thought Content: Thought content normal.        Judgment: Judgment normal.     Lab Results  Component Value Date   WBC 9.4 07/31/2024   HGB 13.0 07/31/2024   HCT 38.7 (L) 07/31/2024   MCV 96.0 07/31/2024   PLT 159 07/31/2024     Chemistry      Component Value Date/Time   NA 141 07/31/2024 0926   K 4.7 07/31/2024 0926   CL 105 07/31/2024 0926   CO2 26 07/31/2024 0926   BUN 23 07/31/2024 0926   CREATININE 1.26 (H) 07/31/2024 0926   CREATININE 1.33 (H) 01/21/2017 1616      Component Value Date/Time   CALCIUM  9.9 07/31/2024 0926   ALKPHOS 63 07/31/2024 0926   AST 22 07/31/2024 0926   ALT 15 07/31/2024 0926   BILITOT 0.5 07/31/2024 0926       Impression and Plan: Mr. Kaigler is a very nice 83 year old white male.  He has IgG kappa myeloma.  His chromosomal abnormalities resolved with  treatment.   I think the last bone marrow test clearly is informative.  His marrow looks clean.  As such, we will hold off on any further treatment for him.  I think we can just follow him along at this point in time.  He is going to do a 24-hour urine for us .  I will go ahead and plan to get him back between Thanksgiving and Christmas.  Hopefully, his poor wife will improve in this gastrum intestinal issue will resolve.   Maude JONELLE Crease, MD 10/27/202510:42 AM

## 2024-08-01 LAB — KAPPA/LAMBDA LIGHT CHAINS
Kappa free light chain: 8.7 mg/L (ref 3.3–19.4)
Kappa, lambda light chain ratio: 2.12 — ABNORMAL HIGH (ref 0.26–1.65)
Lambda free light chains: 4.1 mg/L — ABNORMAL LOW (ref 5.7–26.3)

## 2024-08-01 LAB — IGG, IGA, IGM
IgA: 17 mg/dL — ABNORMAL LOW (ref 61–437)
IgG (Immunoglobin G), Serum: 553 mg/dL — ABNORMAL LOW (ref 603–1613)
IgM (Immunoglobulin M), Srm: 7 mg/dL — ABNORMAL LOW (ref 15–143)

## 2024-08-02 ENCOUNTER — Inpatient Hospital Stay

## 2024-08-02 DIAGNOSIS — C9000 Multiple myeloma not having achieved remission: Secondary | ICD-10-CM | POA: Diagnosis not present

## 2024-08-02 DIAGNOSIS — C9001 Multiple myeloma in remission: Secondary | ICD-10-CM

## 2024-08-04 LAB — PROTEIN ELECTROPHORESIS, SERUM, WITH REFLEX
A/G Ratio: 1.7 (ref 0.7–1.7)
Albumin ELP: 3.9 g/dL (ref 2.9–4.4)
Alpha-1-Globulin: 0.3 g/dL (ref 0.0–0.4)
Alpha-2-Globulin: 0.8 g/dL (ref 0.4–1.0)
Beta Globulin: 0.9 g/dL (ref 0.7–1.3)
Gamma Globulin: 0.4 g/dL (ref 0.4–1.8)
Globulin, Total: 2.3 g/dL (ref 2.2–3.9)
M-Spike, %: 0.1 g/dL — ABNORMAL HIGH
SPEP Interpretation: 0
Total Protein ELP: 6.2 g/dL (ref 6.0–8.5)

## 2024-08-04 LAB — IMMUNOFIXATION REFLEX, SERUM
IgA: 18 mg/dL — ABNORMAL LOW (ref 61–437)
IgG (Immunoglobin G), Serum: 548 mg/dL — ABNORMAL LOW (ref 603–1613)
IgM (Immunoglobulin M), Srm: 6 mg/dL — ABNORMAL LOW (ref 15–143)

## 2024-08-07 ENCOUNTER — Ambulatory Visit: Payer: Self-pay | Admitting: Hematology & Oncology

## 2024-08-07 LAB — UPEP/UIFE/LIGHT CHAINS/TP, 24-HR UR
% BETA, Urine: 30.2 %
ALPHA 1 URINE: 2.8 %
Albumin, U: 31.3 %
Alpha 2, Urine: 27.4 %
Free Kappa Lt Chains,Ur: 7.96 mg/L (ref 1.17–86.46)
Free Kappa/Lambda Ratio: 4.8 (ref 1.83–14.26)
Free Lambda Lt Chains,Ur: 1.66 mg/L (ref 0.27–15.21)
GAMMA GLOBULIN URINE: 8.2 %
Total Protein, Urine-Ur/day: 108 mg/(24.h) (ref 30–150)
Total Protein, Urine: 5.7 mg/dL
Total Volume: 1900

## 2024-08-10 ENCOUNTER — Other Ambulatory Visit: Payer: Self-pay | Admitting: Hematology & Oncology

## 2024-08-10 DIAGNOSIS — C9000 Multiple myeloma not having achieved remission: Secondary | ICD-10-CM

## 2024-09-02 ENCOUNTER — Other Ambulatory Visit: Payer: Self-pay | Admitting: Family Medicine

## 2024-09-04 ENCOUNTER — Other Ambulatory Visit: Payer: Self-pay | Admitting: Family Medicine

## 2024-09-04 DIAGNOSIS — E119 Type 2 diabetes mellitus without complications: Secondary | ICD-10-CM

## 2024-09-09 ENCOUNTER — Other Ambulatory Visit: Payer: Self-pay | Admitting: Family Medicine

## 2024-09-09 DIAGNOSIS — E119 Type 2 diabetes mellitus without complications: Secondary | ICD-10-CM

## 2024-09-13 ENCOUNTER — Other Ambulatory Visit: Payer: Self-pay

## 2024-09-13 ENCOUNTER — Inpatient Hospital Stay: Attending: Hematology & Oncology

## 2024-09-13 ENCOUNTER — Encounter: Payer: Self-pay | Admitting: Hematology & Oncology

## 2024-09-13 ENCOUNTER — Inpatient Hospital Stay (HOSPITAL_BASED_OUTPATIENT_CLINIC_OR_DEPARTMENT_OTHER): Admitting: Hematology & Oncology

## 2024-09-13 VITALS — BP 90/60 | HR 82 | Temp 97.9°F | Resp 16 | Ht 71.0 in | Wt 181.0 lb

## 2024-09-13 DIAGNOSIS — Z9221 Personal history of antineoplastic chemotherapy: Secondary | ICD-10-CM | POA: Diagnosis not present

## 2024-09-13 DIAGNOSIS — C9000 Multiple myeloma not having achieved remission: Secondary | ICD-10-CM | POA: Diagnosis not present

## 2024-09-13 DIAGNOSIS — C9001 Multiple myeloma in remission: Secondary | ICD-10-CM

## 2024-09-13 DIAGNOSIS — D630 Anemia in neoplastic disease: Secondary | ICD-10-CM | POA: Diagnosis not present

## 2024-09-13 LAB — CBC WITH DIFFERENTIAL (CANCER CENTER ONLY)
Abs Immature Granulocytes: 0.01 K/uL (ref 0.00–0.07)
Basophils Absolute: 0 K/uL (ref 0.0–0.1)
Basophils Relative: 0 %
Eosinophils Absolute: 0.2 K/uL (ref 0.0–0.5)
Eosinophils Relative: 2 %
HCT: 38.9 % — ABNORMAL LOW (ref 39.0–52.0)
Hemoglobin: 13.1 g/dL (ref 13.0–17.0)
Immature Granulocytes: 0 %
Lymphocytes Relative: 21 %
Lymphs Abs: 1.8 K/uL (ref 0.7–4.0)
MCH: 32.6 pg (ref 26.0–34.0)
MCHC: 33.7 g/dL (ref 30.0–36.0)
MCV: 96.8 fL (ref 80.0–100.0)
Monocytes Absolute: 0.9 K/uL (ref 0.1–1.0)
Monocytes Relative: 10 %
Neutro Abs: 5.7 K/uL (ref 1.7–7.7)
Neutrophils Relative %: 67 %
Platelet Count: 160 K/uL (ref 150–400)
RBC: 4.02 MIL/uL — ABNORMAL LOW (ref 4.22–5.81)
RDW: 12.2 % (ref 11.5–15.5)
WBC Count: 8.6 K/uL (ref 4.0–10.5)
nRBC: 0 % (ref 0.0–0.2)

## 2024-09-13 LAB — CMP (CANCER CENTER ONLY)
ALT: 13 U/L (ref 0–44)
AST: 21 U/L (ref 15–41)
Albumin: 4.3 g/dL (ref 3.5–5.0)
Alkaline Phosphatase: 74 U/L (ref 38–126)
Anion gap: 10 (ref 5–15)
BUN: 22 mg/dL (ref 8–23)
CO2: 28 mmol/L (ref 22–32)
Calcium: 9.6 mg/dL (ref 8.9–10.3)
Chloride: 102 mmol/L (ref 98–111)
Creatinine: 1.13 mg/dL (ref 0.61–1.24)
GFR, Estimated: >60 mL/min (ref >60)
Glucose, Bld: 172 mg/dL — ABNORMAL HIGH (ref 70–99)
Potassium: 4.1 mmol/L (ref 3.5–5.1)
Sodium: 140 mmol/L (ref 135–145)
Total Bilirubin: 0.4 mg/dL (ref 0.0–1.2)
Total Protein: 6.1 g/dL — ABNORMAL LOW (ref 6.5–8.1)

## 2024-09-13 LAB — LACTATE DEHYDROGENASE: LDH: 141 U/L (ref 105–235)

## 2024-09-13 NOTE — Progress Notes (Signed)
 Hematology and Oncology Follow Up Visit  Alexander Armstrong [id number] [birth year] 83 y.o. 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 --Start on 10/29/2022     Interim History:  Mr. Alexander Armstrong is back for follow-up.  Thankfully, he is doing okay right now.  Unfortunately, his poor wife is not doing well.  She has diverticulitis.  She was in the hospital.  She has been given some antibiotics.  She is still having a tough time recovering.  His myeloma studies have been nice and low and stable.  He has had no treatment for about 2 months now.  When we saw him, back in October, his monoclonal spike was 0.1 g/dL.  The IgG level was 548 mg/dL.  His kappa light chain was 0.9 mg/dL.  He did a 24-hour urine for us .  This was done on 08/02/2024.  There was no monoclonal spike in his blood.  His kappa light chain was 8 mg/L.  His appetite has been okay.  He has had no change in bowel or bladder habits.  He has had no cough.  He no rashes.  There is been no leg swelling.  He has lost a little bit of weight.  Overall, I will say that his performance status is probably ECOG 1.    Wt Readings from Last 3 Encounters:  09/13/24 181 lb (82.1 kg)  07/31/24 188 lb 12.8 oz (85.6 kg)  06/20/24 193 lb 9.6 oz (87.8 kg)     Medications:  Current Outpatient Medications:    ACCU-CHEK GUIDE TEST test strip, USE TO TEST BLOOD SUGAR 3 TIMES A DAY, Disp: 100 strip, Rfl: 0   Accu-Chek Softclix Lancets lancets, 1 EACH BY DOES NOT APPLY ROUTE IN THE MORNING, AT NOON, AND AT BEDTIME. MAY SUBSTITUTE TO ANY MANUFACTURER COVERED BY PATIENT'S INSURANCE., Disp: 100  each, Rfl: 3   acetaminophen  (TYLENOL ) 500 MG tablet, Take 500 mg by mouth 3 (three) times daily as needed for moderate pain (pain score 4-6) or headache., Disp: , Rfl:    aspirin  EC 81 MG tablet, Take 81 mg by mouth daily. Swallow whole., Disp: , Rfl:    Blood Glucose Monitoring Suppl DEVI, 1 each by Does not apply route in the morning, at noon, and at bedtime. May substitute to any manufacturer covered by patient's insurance., Disp: 1 each, Rfl: 0   Cholecalciferol (VITAMIN D3) 1000 units CAPS, Take 1,000 Units by mouth at bedtime., Disp: , Rfl:    clopidogrel  (PLAVIX ) 75 MG tablet, Take 1 tablet (75 mg total) by mouth daily. TAKE 1 TABLET(75 MG) BY MOUTH DAILY, Disp: 90 tablet, Rfl: 3   cyanocobalamin  (VITAMIN B12) 1000 MCG tablet, Take 1,000 mcg by mouth daily., Disp: , Rfl:    dexamethasone  (DECADRON ) 4 MG tablet, TAKE 5 TABS (20 MG) WEEKLY THE DAY OF DARATUMUMAB . TAKE WITH BREAKFAST. (Patient not taking: Reported on 07/31/2024), Disp: 60 tablet, Rfl: 5   diltiazem  (TIADYLT  ER) 120 MG 24 hr capsule, Take 1 capsule (120 mg total) by mouth daily., Disp: 90 capsule, Rfl: 3   diphenhydrAMINE  (BENADRYL  ALLERGY) 25 MG tablet, Take 50 mg by mouth See admin instructions. Take 50 mg by mouth one hour prior to  Daratumumab  infusion on Mondays (Patient not taking: Reported on 07/31/2024), Disp: , Rfl:    Docusate Sodium  (DSS) 100 MG CAPS, Take 100 mg by mouth daily., Disp: , Rfl:    famciclovir  (FAMVIR ) 250 MG tablet, TAKE 1 TABLET BY MOUTH EVERY DAY, Disp: 90 tablet, Rfl: 2   fluticasone (FLONASE) 50 MCG/ACT nasal spray, Place 1 spray into both nostrils daily as needed for allergies or rhinitis., Disp: , Rfl:    furosemide  (LASIX ) 20 MG tablet, Take 1 tablet (20 mg total) by mouth daily as needed., Disp: 30 tablet, Rfl: 11   loratadine (CLARITIN) 10 MG tablet, Take 10 mg by mouth in the morning., Disp: , Rfl:    MAGNESIUM PO, Take 1 tablet by mouth daily., Disp: , Rfl:    meclizine  (ANTIVERT ) 25 MG tablet,  Take 1 tablet (25 mg total) by mouth 3 (three) times daily as needed for dizziness., Disp: 30 tablet, Rfl: 5   montelukast  (SINGULAIR ) 10 MG tablet, TAKE 1 TABLET BY MOUTH EVERYDAY AT BEDTIME, Disp: 90 tablet, Rfl: 2   nitroGLYCERIN  (NITROSTAT ) 0.4 MG SL tablet, Place 1 tablet (0.4 mg total) under the tongue every 5 (five) minutes as needed., Disp: 25 tablet, Rfl: 2   pantoprazole  (PROTONIX ) 40 MG tablet, TAKE 1 TABLET BY MOUTH EVERY DAY, Disp: 90 tablet, Rfl: 3   polyethylene glycol (MIRALAX  / GLYCOLAX ) 17 g packet, Take 17 g by mouth daily as needed for moderate constipation., Disp: , Rfl:    REFRESH PLUS 0.5 % SOLN, Place 1 drop into both eyes 3 (three) times daily as needed (for dryness)., Disp: , Rfl:    rosuvastatin  (CRESTOR ) 20 MG tablet, TAKE 1 TABLET BY MOUTH EVERY DAY, Disp: 90 tablet, Rfl: 3   tamsulosin  (FLOMAX ) 0.4 MG CAPS capsule, TAKE 1 CAPSULE BY MOUTH EVERYDAY AT BEDTIME, Disp: 90 capsule, Rfl: 3  Allergies:  Allergies  Allergen Reactions   Latex Other (See Comments)    takes the skin off (tape)    Past Medical History, Surgical history, Social history, and Family History were reviewed and updated.  Review of Systems: Review of Systems  Constitutional: Negative.   HENT:  Negative.    Eyes: Negative.   Respiratory: Negative.    Cardiovascular: Negative.   Gastrointestinal: Negative.   Endocrine: Negative.   Genitourinary: Negative.    Musculoskeletal: Negative.   Skin: Negative.   Neurological: Negative.   Hematological: Negative.   Psychiatric/Behavioral: Negative.      Physical Exam:  Vitals:   09/13/24 0800  BP: 90/60  Pulse: 82  Resp: 16  Temp: 97.9 F (36.6 C)  SpO2: 98%     Wt Readings from Last 3 Encounters:  09/13/24 181 lb (82.1 kg)  07/31/24 188 lb 12.8 oz (85.6 kg)  06/20/24 193 lb 9.6 oz (87.8 kg)    Physical Exam Vitals reviewed.  HENT:     Head: Normocephalic and atraumatic.  Eyes:     Pupils: Pupils are equal, round, and  reactive to light.  Cardiovascular:     Rate and Rhythm: Normal rate and regular rhythm.     Heart sounds: Normal heart sounds.  Pulmonary:     Effort: Pulmonary effort is normal.     Breath sounds: Normal breath sounds.  Abdominal:     General: Bowel sounds are normal.     Palpations: Abdomen is soft.  Musculoskeletal:        General: No tenderness or deformity. Normal range of motion.     Cervical back:  Normal range of motion.  Lymphadenopathy:     Cervical: No cervical adenopathy.  Skin:    General: Skin is warm and dry.     Findings: No erythema or rash.  Neurological:     Mental Status: He is alert and oriented to person, place, and time.  Psychiatric:        Behavior: Behavior normal.        Thought Content: Thought content normal.        Judgment: Judgment normal.     Lab Results  Component Value Date   WBC 8.6 09/13/2024   HGB 13.1 09/13/2024   HCT 38.9 (L) 09/13/2024   MCV 96.8 09/13/2024   PLT 160 09/13/2024     Chemistry      Component Value Date/Time   NA 140 09/13/2024 0820   K 4.1 09/13/2024 0820   CL 102 09/13/2024 0820   CO2 28 09/13/2024 0820   BUN 22 09/13/2024 0820   CREATININE 1.13 09/13/2024 0820   CREATININE 1.33 (H) 01/21/2017 1616      Component Value Date/Time   CALCIUM  9.6 09/13/2024 0820   ALKPHOS 74 09/13/2024 0820   AST 21 09/13/2024 0820   ALT 13 09/13/2024 0820   BILITOT 0.4 09/13/2024 0820       Impression and Plan: Mr. Reffett is a very nice 83 year old white male.  He has IgG kappa myeloma.  His chromosomal abnormalities resolved with treatment.   For right now, we are just following along.  Everything is looking quite good right now.  We will see what his myeloma studies look like.  I just want him healthy so we can help his poor wife.  I know she has had her issues.  Hopefully she will recover from this diverticulitis.  Will plan to get him back in about 2 months now.    Maude JONELLE Crease, MD 12/10/20259:22 AM

## 2024-09-15 ENCOUNTER — Other Ambulatory Visit: Payer: Self-pay

## 2024-09-17 ENCOUNTER — Other Ambulatory Visit: Payer: Self-pay | Admitting: Cardiology

## 2024-09-23 ENCOUNTER — Other Ambulatory Visit: Payer: Self-pay | Admitting: Family Medicine

## 2024-09-23 DIAGNOSIS — E119 Type 2 diabetes mellitus without complications: Secondary | ICD-10-CM

## 2024-09-28 ENCOUNTER — Emergency Department (HOSPITAL_BASED_OUTPATIENT_CLINIC_OR_DEPARTMENT_OTHER)

## 2024-09-28 ENCOUNTER — Emergency Department (HOSPITAL_BASED_OUTPATIENT_CLINIC_OR_DEPARTMENT_OTHER)
Admission: EM | Admit: 2024-09-28 | Discharge: 2024-09-28 | Disposition: A | Discharge status: Home / Self Care | Attending: Emergency Medicine | Admitting: Emergency Medicine

## 2024-09-28 ENCOUNTER — Other Ambulatory Visit: Payer: Self-pay

## 2024-09-28 ENCOUNTER — Encounter (HOSPITAL_BASED_OUTPATIENT_CLINIC_OR_DEPARTMENT_OTHER): Payer: Self-pay

## 2024-09-28 DIAGNOSIS — Z91040 Latex allergy status: Secondary | ICD-10-CM | POA: Diagnosis not present

## 2024-09-28 DIAGNOSIS — R0602 Shortness of breath: Secondary | ICD-10-CM | POA: Diagnosis not present

## 2024-09-28 DIAGNOSIS — J069 Acute upper respiratory infection, unspecified: Secondary | ICD-10-CM | POA: Insufficient documentation

## 2024-09-28 DIAGNOSIS — R509 Fever, unspecified: Secondary | ICD-10-CM | POA: Diagnosis present

## 2024-09-28 DIAGNOSIS — Z7982 Long term (current) use of aspirin: Secondary | ICD-10-CM | POA: Diagnosis not present

## 2024-09-28 DIAGNOSIS — Z7902 Long term (current) use of antithrombotics/antiplatelets: Secondary | ICD-10-CM | POA: Insufficient documentation

## 2024-09-28 DIAGNOSIS — Z8579 Personal history of other malignant neoplasms of lymphoid, hematopoietic and related tissues: Secondary | ICD-10-CM | POA: Diagnosis not present

## 2024-09-28 DIAGNOSIS — I2510 Atherosclerotic heart disease of native coronary artery without angina pectoris: Secondary | ICD-10-CM | POA: Insufficient documentation

## 2024-09-28 LAB — RESP PANEL BY RT-PCR (RSV, FLU A&B, COVID)  RVPGX2
Influenza A by PCR: NEGATIVE
Influenza B by PCR: NEGATIVE
Resp Syncytial Virus by PCR: NEGATIVE
SARS Coronavirus 2 by RT PCR: NEGATIVE

## 2024-09-28 LAB — BASIC METABOLIC PANEL WITH GFR
Anion gap: 13 (ref 5–15)
BUN: 28 mg/dL — ABNORMAL HIGH (ref 8–23)
CO2: 22 mmol/L (ref 22–32)
Calcium: 9 mg/dL (ref 8.9–10.3)
Chloride: 103 mmol/L (ref 98–111)
Creatinine, Ser: 1.3 mg/dL — ABNORMAL HIGH (ref 0.61–1.24)
GFR, Estimated: 55 mL/min — ABNORMAL LOW
Glucose, Bld: 205 mg/dL — ABNORMAL HIGH (ref 70–99)
Potassium: 4 mmol/L (ref 3.5–5.1)
Sodium: 137 mmol/L (ref 135–145)

## 2024-09-28 LAB — CBC WITH DIFFERENTIAL/PLATELET
Abs Immature Granulocytes: 0.04 K/uL (ref 0.00–0.07)
Basophils Absolute: 0 K/uL (ref 0.0–0.1)
Basophils Relative: 0 %
Eosinophils Absolute: 0.1 K/uL (ref 0.0–0.5)
Eosinophils Relative: 1 %
HCT: 36.4 % — ABNORMAL LOW (ref 39.0–52.0)
Hemoglobin: 12.6 g/dL — ABNORMAL LOW (ref 13.0–17.0)
Immature Granulocytes: 1 %
Lymphocytes Relative: 22 %
Lymphs Abs: 1.6 K/uL (ref 0.7–4.0)
MCH: 32.2 pg (ref 26.0–34.0)
MCHC: 34.6 g/dL (ref 30.0–36.0)
MCV: 93.1 fL (ref 80.0–100.0)
Monocytes Absolute: 0.9 K/uL (ref 0.1–1.0)
Monocytes Relative: 12 %
Neutro Abs: 4.8 K/uL (ref 1.7–7.7)
Neutrophils Relative %: 64 %
Platelets: 116 K/uL — ABNORMAL LOW (ref 150–400)
RBC: 3.91 MIL/uL — ABNORMAL LOW (ref 4.22–5.81)
RDW: 12.5 % (ref 11.5–15.5)
WBC: 7.4 K/uL (ref 4.0–10.5)
nRBC: 0 % (ref 0.0–0.2)

## 2024-09-28 MED ORDER — ALBUTEROL SULFATE HFA 108 (90 BASE) MCG/ACT IN AERS
2.0000 | INHALATION_SPRAY | Freq: Once | RESPIRATORY_TRACT | Status: AC
  Administered 2024-09-28: 2 via RESPIRATORY_TRACT
  Filled 2024-09-28: qty 6.7

## 2024-09-28 MED ORDER — AMOXICILLIN-POT CLAVULANATE 875-125 MG PO TABS: 1.0000 | ORAL_TABLET | Freq: Two times a day (BID) | ORAL | 0 refills | Status: DC

## 2024-09-28 MED ORDER — AMOXICILLIN-POT CLAVULANATE 875-125 MG PO TABS
1.0000 | ORAL_TABLET | Freq: Once | ORAL | Status: AC
  Administered 2024-09-28: 1 via ORAL
  Filled 2024-09-28: qty 1

## 2024-09-28 NOTE — Discharge Instructions (Signed)
 All lab work today was reassuring.  No signs of pneumonia on your x-ray.  However you are getting started on antibiotic given the length of your symptoms.  If you start feeling worse despite starting the antibiotic, start having more shortness of breath, vomiting, inability to get out of bed or move around or persistent fever.  Return to the emergency room.

## 2024-09-28 NOTE — ED Triage Notes (Signed)
 Pt reports fever, hypotension, body aches since Sunday. H/x pneumonia.

## 2024-09-28 NOTE — ED Provider Notes (Signed)
 " [facility] Provider Note   CSN: [id number] Arrival date & time: 09/28/24  1717     Patient presents with: Fever and Generalized Body Aches   Alexander Armstrong is a 83 y.o. male.   Patient is an 83 year old male with a history of hyperlipidemia, GERD, CAD status post STEMI and PCI, multiple myeloma and prior pneumonia who is presenting today with several complaints.  He reports approximately 5 days ago he developed cough, sore throat, malaise and fever.  He has noticed when he is breathing out hard or coughing he is having some wheezing and notes a little more shortness of breath with exertion than baseline.  He has not had any vomiting or diarrhea.  He reports he has been taking p.o.'s well.  He has not noticed any swelling in his lower extremities and on occasion he will have some chest pain with cough and breathing but nothing today.  He has had no episodes of syncope.  He did not have a flu shot this year.  The history is provided by the patient.  Fever      Prior to Admission medications  Medication Sig Start Date End Date Taking? Authorizing Provider  amoxicillin -clavulanate (AUGMENTIN ) 875-125 MG tablet Take 1 tablet by mouth every 12 (twelve) hours. 09/28/24  Yes Doretha Folks, MD  ACCU-CHEK GUIDE TEST test strip USE TO TEST BLOOD SUGAR 3 TIMES A DAY 09/25/24   Katrinka Garnette KIDD, MD  Accu-Chek Softclix Lancets lancets 1 EACH BY DOES NOT APPLY ROUTE IN THE MORNING, AT NOON, AND AT BEDTIME. MAY SUBSTITUTE TO ANY MANUFACTURER COVERED BY PATIENT'S INSURANCE. 09/11/24   Katrinka Garnette KIDD, MD  acetaminophen  (TYLENOL ) 500 MG tablet Take 500 mg by mouth 3 (three) times daily as needed for moderate pain (pain score 4-6) or headache.    [provider]  aspirin  EC 81 MG tablet Take 81 mg by mouth daily. Swallow whole.    [provider]  Blood Glucose Monitoring Suppl DEVI 1 each by Does not apply route in the morning, at  noon, and at bedtime. May substitute to any manufacturer covered by patient's insurance. 02/21/24   Katrinka Garnette KIDD, MD  Cholecalciferol (VITAMIN D3) 1000 units CAPS Take 1,000 Units by mouth at bedtime.    [provider]  clopidogrel  (PLAVIX ) 75 MG tablet TAKE 1 TABLET (75 MG TOTAL) BY MOUTH DAILY. 09/19/24   Anner Alm ORN, MD  cyanocobalamin  (VITAMIN B12) 1000 MCG tablet Take 1,000 mcg by mouth daily.    [provider]  dexamethasone  (DECADRON ) 4 MG tablet TAKE 5 TABS (20 MG) WEEKLY THE DAY OF DARATUMUMAB . TAKE WITH BREAKFAST. Patient not taking: Reported on 07/31/2024 09/15/23   Timmy Maude SAUNDERS, MD  diltiazem  (TIADYLT  ER) 120 MG 24 hr capsule Take 1 capsule (120 mg total) by mouth daily. 03/29/24   Anner Alm ORN, MD  diphenhydrAMINE  (BENADRYL  ALLERGY) 25 MG tablet Take 50 mg by mouth See admin instructions. Take 50 mg by mouth one hour prior to Daratumumab  infusion on Mondays Patient not taking: Reported on 07/31/2024    [provider]  Docusate Sodium  (DSS) 100 MG CAPS Take 100 mg by mouth daily. 09/23/22   [provider]  famciclovir  (FAMVIR ) 250 MG tablet TAKE 1 TABLET BY MOUTH EVERY DAY 08/10/24   Ennever, Peter R, MD  fluticasone (FLONASE) 50 MCG/ACT nasal spray Place 1 spray into both nostrils daily as needed for allergies or rhinitis.    [provider]  furosemide  (LASIX ) 20 MG tablet Take 1 tablet (20 mg total) by mouth daily as needed. 03/15/24   Anner Alm ORN, MD  loratadine (CLARITIN) 10 MG tablet Take 10 mg by mouth in the morning.    [provider]  MAGNESIUM PO Take 1 tablet by mouth daily.    [provider]  meclizine  (ANTIVERT ) 25 MG tablet Take 1 tablet (25 mg total) by mouth 3 (three) times daily as needed for dizziness. 11/18/23   Timmy Maude SAUNDERS, MD  montelukast  (SINGULAIR ) 10 MG tablet TAKE 1 TABLET BY MOUTH EVERYDAY AT BEDTIME 08/10/24   Timmy Maude SAUNDERS, MD  nitroGLYCERIN  (NITROSTAT ) 0.4 MG SL  tablet Place 1 tablet (0.4 mg total) under the tongue every 5 (five) minutes as needed. 04/17/24   Henry Manuelita NOVAK, NP  pantoprazole  (PROTONIX ) 40 MG tablet TAKE 1 TABLET BY MOUTH EVERY DAY 09/06/23   Katrinka Garnette KIDD, MD  polyethylene glycol (MIRALAX  / GLYCOLAX ) 17 g packet Take 17 g by mouth daily as needed for moderate constipation.    [provider]  REFRESH PLUS 0.5 % SOLN Place 1 drop into both eyes 3 (three) times daily as needed (for dryness).    [provider]  rosuvastatin  (CRESTOR ) 20 MG tablet TAKE 1 TABLET BY MOUTH EVERY DAY 06/23/24   Katrinka Garnette KIDD, MD  tamsulosin  (FLOMAX ) 0.4 MG CAPS capsule TAKE 1 CAPSULE BY MOUTH EVERYDAY AT BEDTIME 03/15/24   Katrinka Garnette KIDD, MD    Allergies: Latex    Review of Systems  Constitutional:  Positive for fever.    Updated Vital Signs BP (!) 107/93   Pulse 88   Temp 98.9 F (37.2 C)   Resp 17   Ht 5' 11 (1.803 m)   Wt 82.1 kg   SpO2 98%   BMI 25.24 kg/m   Physical Exam Vitals and nursing note reviewed.  Constitutional:      General: He is not in acute distress.    Appearance: He is well-developed.  HENT:     Head: Normocephalic and atraumatic.  Eyes:     Conjunctiva/sclera: Conjunctivae normal.     Pupils: Pupils are equal, round, and reactive to light.  Cardiovascular:     Rate and Rhythm: Normal rate and regular rhythm.     Heart sounds: No murmur heard. Pulmonary:     Effort: Pulmonary effort is normal. No respiratory distress.     Breath sounds: Wheezing present. No rales.     Comments: Scant wheezing on forced expiration and with coughing Abdominal:     General: There is no distension.     Palpations: Abdomen is soft.     Tenderness: There is no abdominal tenderness. There is no guarding or rebound.  Musculoskeletal:        General: No tenderness. Normal range of motion.     Cervical back: Normal range of motion and neck supple.     Right lower leg: No edema.     Left lower leg: No edema.   Skin:    General: Skin is warm and dry.     Findings: No erythema or rash.  Neurological:     Mental Status: He is alert and oriented to person, place, and time.  Psychiatric:        Behavior: Behavior normal.     (all labs ordered are listed, but only abnormal results are displayed) Labs Reviewed  BASIC METABOLIC PANEL WITH GFR - Abnormal; Notable for the following components:  Result Value   Glucose, Bld 205 (*)    BUN 28 (*)    Creatinine, Ser 1.30 (*)    GFR, Estimated 55 (*)    All other components within normal limits  CBC WITH DIFFERENTIAL/PLATELET - Abnormal; Notable for the following components:   RBC 3.91 (*)    Hemoglobin 12.6 (*)    HCT 36.4 (*)    Platelets 116 (*)    All other components within normal limits  RESP PANEL BY RT-PCR (RSV, FLU A&B, COVID)  [id number]    EKG: EKG Interpretation Date/Time:  Thursday September 28 2024 17:29:47 EST Ventricular Rate:  84 PR Interval:  229 QRS Duration:  90 QT Interval:  353 QTC Calculation: 418 R Axis:   -12  Text Interpretation: Sinus rhythm Prolonged PR interval Anterolateral infarct, old Confirmed by Doretha Folks ([id number]) on 09/28/2024 5:47:44 PM  Radiology: [facility] Chest Port 1 View Result Date: 09/28/2024 CLINICAL DATA:  Fever with hypotension and body aches. EXAM: PORTABLE CHEST 1 VIEW COMPARISON:  Feb 10, 2024 FINDINGS: The cardiac silhouette is mildly enlarged and unchanged in size. A loop recorder device is seen. Mild areas of scarring and/or atelectasis are noted within the bilateral lung bases. No acute infiltrate, pleural effusion or pneumothorax is identified. Multilevel degenerative changes are present throughout the thoracic spine. IMPRESSION: Mild bibasilar scarring and/or atelectasis. Electronically Signed   By: Suzen Dials M.D.   On: 09/28/2024 18:04     Procedures   Medications Ordered in the ED  amoxicillin -clavulanate (AUGMENTIN ) 875-125 MG per tablet 1 tablet (has no administration in  time range)  albuterol  (VENTOLIN  HFA) 108 (90 Base) MCG/ACT inhaler 2 puff (2 puffs Inhalation Given 09/28/24 1742)                                    Medical Decision Making Amount and/or Complexity of Data Reviewed External Data Reviewed: notes. Labs: ordered. Decision-making details documented in ED Course. Radiology: ordered and independent interpretation performed. Decision-making details documented in ED Course. ECG/medicine tests: ordered and independent interpretation performed. Decision-making details documented in ED Course.  Risk Prescription drug management.   Pt with multiple medical problems and comorbidities and presenting today with a complaint that caries a high risk for morbidity and mortality.  Here today with symptoms concerning for influenza versus pneumonia versus bronchitis.  Lower suspicion for CHF or ACS.  Low suspicion for PE, myocarditis or pericardial effusion.  Patient is not displaying signs of sepsis at this time.  Appears to be well-hydrated. 7:05 PM I independently interpreted patient's EKG and labs.  BMP without significant change, slight bump in creatinine to 1.3 from 1.1, CBC within normal limits, viral panel is negative.  EKG without acute changes today.  I have independently visualized and interpreted pt's images today.  Chest x-ray without acute findings. In discussing with the patient he has now had symptoms for approximately a week and last fever was last night.  Will cover with an antibiotic due to concern for possible developing pneumonia.  Patient is still satting 89% on room air with no difficulty breathing at this time.  Encouraged him to continue oral hydration and return for worsening symptoms.  He and his wife are comfortable with this plan.      Final diagnoses:  Upper respiratory tract infection, unspecified type    ED Discharge Orders          Ordered  amoxicillin -clavulanate (AUGMENTIN ) 875-125 MG tablet  Every 12 hours         09/28/24 1904               Doretha Folks, MD 09/28/24 1905  "

## 2024-10-11 LAB — IGG, IGA, IGM

## 2024-10-11 LAB — PROTEIN ELECTROPHORESIS, SERUM, WITH REFLEX

## 2024-10-11 LAB — KAPPA/LAMBDA LIGHT CHAINS

## 2024-10-20 ENCOUNTER — Ambulatory Visit: Admitting: Family Medicine

## 2024-10-20 ENCOUNTER — Ambulatory Visit: Payer: Self-pay | Admitting: Family Medicine

## 2024-10-20 ENCOUNTER — Encounter: Payer: Self-pay | Admitting: Family Medicine

## 2024-10-20 VITALS — BP 102/60 | HR 61 | Temp 98.6°F | Ht 71.0 in | Wt 184.4 lb

## 2024-10-20 DIAGNOSIS — H538 Other visual disturbances: Secondary | ICD-10-CM

## 2024-10-20 DIAGNOSIS — I2510 Atherosclerotic heart disease of native coronary artery without angina pectoris: Secondary | ICD-10-CM

## 2024-10-20 DIAGNOSIS — R0602 Shortness of breath: Secondary | ICD-10-CM

## 2024-10-20 DIAGNOSIS — R42 Dizziness and giddiness: Secondary | ICD-10-CM

## 2024-10-20 DIAGNOSIS — I959 Hypotension, unspecified: Secondary | ICD-10-CM

## 2024-10-20 DIAGNOSIS — E1169 Type 2 diabetes mellitus with other specified complication: Secondary | ICD-10-CM

## 2024-10-20 LAB — GLUCOSE, POCT (MANUAL RESULT ENTRY): POC Glucose: 130 mg/dL — AB (ref 70–99)

## 2024-10-20 NOTE — Addendum Note (Signed)
 Addended by: KATRINKA GARNETTE KIDD on: 10/20/2024 08:58 PM   Modules accepted: Level of Service

## 2024-10-20 NOTE — Progress Notes (Signed)
 " Phone [phone] In person visit   Subjective:   Alexander Armstrong is a 84 y.o. year old very pleasant male patient who presents for/with See problem oriented charting Chief Complaint  Patient presents with   Follow-up    4 month follow-up for diabetes. No questions or concerns for today's appointment     Diabetes   Dizziness    Says dizziness has gotten worse the past 3 weeks and complained of blurred vision on the drive here.    Blurred Vision    Past Medical History-  Patient Active Problem List   Diagnosis Date Noted   Partial small bowel obstruction (HCC) 09/21/2022    Priority: High   Multiple myeloma (HCC) 08/13/2022    Priority: High   Wide-complex tachycardia - WCT (HCC) 12/09/2016    Priority: High   Cardiac syncope 03/10/2016    Priority: High   Cardiac device in situ 11/18/2015    Priority: High   Type 2 diabetes mellitus without complication, without long-term current use of insulin (HCC) 11/18/2015    Priority: High   Paroxysmal VT (HCC) 09/17/2015    Priority: High   History of acute anterior wall MI 01/26/2008    Priority: High   Coronary artery disease involving native coronary artery of native heart with angina pectoris 11/03/2004    Priority: High   Vitamin D  deficiency 12/23/2022    Priority: Medium    Hypotension 02/22/2018    Priority: Medium    BPH associated with nocturia 11/18/2015    Priority: Medium    Gastroesophageal reflux disease without esophagitis 11/18/2015    Priority: Medium    Hyperlipidemia associated with type 2 diabetes mellitus (HCC) 01/26/2008    Priority: Medium    ESOPHAGEAL STRICTURE 12/26/2007    Priority: Medium    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    Priority: Medium    Pneumonia of both lower lobes due to methicillin resistant Staphylococcus aureus (MRSA) (HCC) 11/05/2020    Priority: Low   Exercise intolerance 02/20/2019    Priority: Low   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   Other acquired hammer toe 05/27/2017    Priority: 1.   Acquired deformity of toe 05/27/2017    Priority: 1.   Acute right-sided low back pain without sciatica 11/18/2015    Priority: 1.   Ingrown nail 11/18/2015    Priority: 1.   Lower extremity edema 03/16/2024   Antineoplastic chemotherapy induced anemia 12/16/2023   Chemotherapy-induced thrombocytopenia 12/16/2023   MRSA bacteremia 11/05/2020   Thrombocytopenia 05/17/2020    Medications- reviewed and updated Current Outpatient Medications  Medication Sig Dispense Refill   ACCU-CHEK GUIDE TEST test strip USE TO TEST BLOOD SUGAR 3 TIMES A DAY 100 strip 0   Accu-Chek Softclix Lancets lancets 1 EACH BY DOES NOT APPLY ROUTE IN THE MORNING, AT NOON, AND AT BEDTIME. MAY SUBSTITUTE TO ANY MANUFACTURER COVERED BY PATIENT'S INSURANCE. 100  each 3   acetaminophen  (TYLENOL ) 500 MG tablet Take 500 mg by mouth 3 (three) times daily as needed for moderate pain (pain score 4-6) or headache.     aspirin  EC 81 MG tablet Take 81 mg by mouth daily. Swallow whole.     Blood Glucose Monitoring Suppl DEVI 1 each by Does not apply route in the morning, at noon, and at bedtime. May substitute to any manufacturer covered by patient's insurance. 1 each 0   Cholecalciferol (VITAMIN D3) 1000 units CAPS Take 1,000 Units by mouth at bedtime.     clopidogrel  (PLAVIX ) 75 MG tablet TAKE 1 TABLET (75 MG TOTAL) BY MOUTH DAILY. 90 tablet 2   cyanocobalamin  (VITAMIN B12) 1000 MCG tablet  Take 1,000 mcg by mouth daily.     dexamethasone  (DECADRON ) 4 MG tablet TAKE 5 TABS (20 MG) WEEKLY THE DAY OF DARATUMUMAB . TAKE WITH BREAKFAST. 60 tablet 5   diltiazem  (TIADYLT  ER) 120 MG 24 hr capsule  HOLD after visit Take 1 capsule (120 mg total) by mouth daily. 90 capsule 3   diphenhydrAMINE  (BENADRYL  ALLERGY) 25 MG tablet Take 50 mg by mouth See admin instructions. Take 50 mg by mouth one hour prior to Daratumumab  infusion on Mondays     Docusate Sodium  (DSS) 100 MG CAPS Take 100 mg by mouth daily.     famciclovir  (FAMVIR ) 250 MG tablet TAKE 1 TABLET BY MOUTH EVERY DAY 90 tablet 2   fluticasone (FLONASE) 50 MCG/ACT nasal spray Place 1 spray into both nostrils daily as needed for allergies or rhinitis.     furosemide  (LASIX ) 20 MG tablet  Not needing Take 1 tablet (20 mg total) by mouth daily as needed. 30 tablet 11   loratadine (CLARITIN) 10 MG tablet Take 10 mg by mouth in the morning.     MAGNESIUM PO Take 1 tablet by mouth daily.     meclizine  (ANTIVERT ) 25 MG tablet Take 1 tablet (25 mg total) by mouth 3 (three) times daily as needed for dizziness. 30 tablet 5   montelukast  (SINGULAIR ) 10 MG tablet TAKE 1 TABLET BY MOUTH EVERYDAY AT BEDTIME 90 tablet 2   nitroGLYCERIN  (NITROSTAT ) 0.4 MG SL tablet Place 1 tablet (0.4 mg total) under the tongue every 5 (five) minutes as needed. 25 tablet 2   pantoprazole  (PROTONIX ) 40 MG tablet TAKE 1 TABLET BY MOUTH EVERY DAY 90 tablet 3   polyethylene glycol (MIRALAX  / GLYCOLAX ) 17 g packet Take 17 g by mouth daily as needed for moderate constipation.     REFRESH PLUS 0.5 % SOLN Place 1 drop into both eyes 3 (three) times daily as needed (for dryness).     rosuvastatin  (CRESTOR ) 20 MG tablet TAKE 1 TABLET BY MOUTH EVERY DAY 90 tablet 3   tamsulosin  (FLOMAX ) 0.4 MG CAPS capsule  HOLD AFTER VISIT TAKE 1 CAPSULE BY MOUTH EVERYDAY AT BEDTIME 90 capsule 3   No current facility-administered medications for this visit.     Objective:  BP 102/60 (Cuff  Size: Normal) Comment (Cuff Size): left arm  Pulse 61   Temp 98.6 F (37 C) (Temporal)   Ht 5' 11 (1.803 m)   Wt 184 lb 6.4 oz (83.6 kg)   SpO2 99%   BMI 25.72 kg/m  Gen: NAD, resting comfortably- does not appears acutely ill or fatigued to match his level of initial potential hypotension with systolic blood pressure reportedly in 70s and 80s CV: RRR no murmurs rubs or gallops Lungs: CTAB no crackles, wheeze, rhonchi  Abdomen: soft/nontender/nondistended/normal bowel sounds. No rebound or guarding.  Ext: no edema Skin: warm, dry Neuro: grossly normal, moves all extremities  EKG: sinus bradycardis rhythm with rate 58, normal axis, prolonged PR interval first degree block- otherwise normal intervals, no hypertrophy, no st or t wave changes  Vision Screening   Right eye Left eye Both eyes  Without correction 20/25 20/30 20/20   With correction           Assessment and Plan   # Dizziness and relative hypotension S:last 3 weeks has noted progressive dizziness. He also has blurred vision on drive here and reports mild still. Worse with standing feels better sitting.   Patient was last seen 09/28/2024 at [facility] emergency department in [facility] after 5 days of cough, sore throat, malaise and fever.  His respiratory viral panel was negative.  BMP largely stable with GFR around 55.  CBC with mild anemia at 12.6 which is his baseline and platelets are down to 116 but often mildly low.  His blood pressure was low and he had a chest x-ray as well which showed only mild bibasilar scarring.  He ultimately was treated with Augmentin  to cover for potential developing pneumonia.  His oxygen levels at 89% at that time before responding up to 98%.  Was also treated with albuterol .  His blood pressure was low at that time systolic at least 107/93. They did EKG  He reports continues to feel congested but better after antibiotic(s) maybe 50% better. Continues to feel tired and dizzy. Dizziness has gotten  worse. He reports years ago before heart attack very common for blood pressure to run in this range maybe 80's or 90s but not in recent memory.  Perhaps mildly winded but no worse than previous  He reports he felt acutely dizzy on Saturday and blood pressure at home was high 70s over 50s  A/P: Patient with illness in December despite some improvement from respiratory perspective has continued worsening dizziness and initial blood pressures in the office today were in the 70s and 80s systolic.  He also reported some blurry vision on the way to the office today with some mild persistence in office but thankfully 20/20 vision.  He has known CAD and EKG largely reassuring with sinus bradycardia/first-degree block.   -Did a stat troponin which thankfully was not elevated and without since he had been having symptoms for some time this would be an adequate test -BNP was not elevated thankfully though he had no edema which may have a slightly less likely  -We opted to liberalize his sodium intake and fluid intake aggressively as long as he had no chest pain or worsening edema -He has been on diltiazem  for palpitations and we opted to hold this-I was going to reach out to Dr. Anner but appears he was out of the office about next week-instead we will gather information over the weekend on palpation of the blood pressure and his symptoms of dizziness and if those fail to improve discussed possible echocardiogram or further workup or getting Dr. Sibyl perspective especially with his significant CAD (remain on aspirin  81 mg, Plavix  75 mg and rosuvastaitn 20 mg daily) - he does not look acutely ill but his symptom(s) were very concerning to me- we discussed Emergency Department visit but he declines unless worsening symptom(s) - I told him very concerned about the blurry vision but 20/20 with both eyes was reassuring.  - was worried abouthyperglycemia or  hypoglycemia but at 130 today lower concern -  looking  back at last oncology visit blood pressure was 90/60 and that actually brings me some peace of mind- I think blood pressure change that seemed acute plus lightheadedness plus blurry vision in combo was my greatest concern - in addition to hold diltiazem  I also mentioned holding tamsulosin  as his symptom(s) are worse with standing  THANKFULLY probnp and troponin not elevated today. Does appear dehydrated with high BUN/creatinine ratio and I wonder if his intake is low with low protein  # Multiple myeloma-follows with Dr. Timmy.  Has been called in remission for now late in 2025.  Has planned follow-up in about 2 months  #Palpitations-as needed diltiazem  60 mg. also 120 mg extended release - we opted to hold these medications as above     # Diabetes and hyperlipidemia associated with diabetes  S: Medication: diet controlled . Rosuvastaitn 20 mg daily Lab Results  Component Value Date   HGBA1C 7.7 (H) 06/20/2024   HGBA1C 7.7 (A) 02/17/2024   HGBA1C 7.2 (A) 10/14/2023   A/P: slightly elevated last 2 checks but tolerable- continue to monitor off meds   # lipids at goal with LDL at least under 70 - we could push for under 55 if there is cardiac disease here  Recommended follow up: Return for as needed for new, worsening, persistent symptoms. Future Appointments  Date Time Provider Department Center  11/08/2024  9:15 AM [facility] LAB [facility] None  11/08/2024  9:30 AM Ennever, Maude SAUNDERS, MD [facility] None    Lab/Order associations:   ICD-10-CM   1. Hypotension, unspecified hypotension type  I95.9 EKG 12-Lead    POCT glucose (manual entry)    Comprehensive metabolic panel with GFR    CBC with Differential/Platelet    TSH    B Nat Peptide    Troponin I    Magnesium    Magnesium    Troponin I    B Nat Peptide    Comprehensive metabolic panel with GFR    CBC with Differential/Platelet    TSH    2. Dizziness  R42 EKG 12-Lead    POCT glucose (manual entry)    3. Blurred vision  H53.8 EKG  12-Lead    POCT glucose (manual entry)    4. Lightheadedness  R42 B Nat Peptide    Troponin I    Troponin I    B Nat Peptide    5. Shortness of breath  R06.02 B Nat Peptide    Troponin I    Troponin I    B Nat Peptide    6. Hyperlipidemia associated with type 2 diabetes mellitus (HCC)  E11.69 TSH   E78.5 Hemoglobin A1c    TSH    Hemoglobin A1c    7. Coronary artery disease involving native coronary artery of native heart without angina pectoris  I25.10       No orders of the defined types were placed in this encounter.  I personally spent a total of 50 minutes in the care of the patient today including preparing to see the patient, getting/reviewing separately obtained history, performing a medically appropriate exam/evaluation, counseling and educating, placing orders, and documenting clinical information in the EHR . Separate 5 minutes spent on EKG interpretation/discussion.  Return precautions advised.  Garnette Lukes, MD  "

## 2024-10-20 NOTE — Patient Instructions (Addendum)
 My preference was emergency department but you wanted to try conservative measures. I was open That I was nervous about this approach- could have serious heart issue for instance. I am doing my best to evaluate with basic labs and adjusting medications- if you have new or worsening symptom(s) you agreed to go to Emergency Department   Hold diltiazem . Also if you can tolerate hold your tamsulosin .   Increase salt intake significantly! Liquid IV great choice. If you have more swelling or lightheadness with increasing this please go to Emergency Department  Recommended follow up: Return for as needed for new, worsening, persistent symptoms. At latest 4 months for regular follow up but I also want you to update me on Monday with home blood pressure and how you are feeling please. Check blood pressure daily

## 2024-10-21 ENCOUNTER — Encounter: Payer: Self-pay | Admitting: Hematology & Oncology

## 2024-10-21 LAB — CBC WITH DIFFERENTIAL/PLATELET
Absolute Lymphocytes: 1685 {cells}/uL (ref 850–3900)
Absolute Monocytes: 835 {cells}/uL (ref 200–950)
Basophils Absolute: 43 {cells}/uL (ref 0–200)
Basophils Relative: 0.6 %
Eosinophils Absolute: 288 {cells}/uL (ref 15–500)
Eosinophils Relative: 4 %
HCT: 37.1 % — ABNORMAL LOW (ref 39.4–51.1)
Hemoglobin: 12.1 g/dL — ABNORMAL LOW (ref 13.2–17.1)
MCH: 31.8 pg (ref 27.0–33.0)
MCHC: 32.6 g/dL (ref 31.6–35.4)
MCV: 97.6 fL (ref 81.4–101.7)
MPV: 11.1 fL (ref 7.5–12.5)
Monocytes Relative: 11.6 %
Neutro Abs: 4349 {cells}/uL (ref 1500–7800)
Neutrophils Relative %: 60.4 %
Platelets: 140 Thousand/uL (ref 140–400)
RBC: 3.8 Million/uL — ABNORMAL LOW (ref 4.20–5.80)
RDW: 12 % (ref 11.0–15.0)
Total Lymphocyte: 23.4 %
WBC: 7.2 Thousand/uL (ref 3.8–10.8)

## 2024-10-21 LAB — TROPONIN I: Troponin I: 9 ng/L

## 2024-10-21 LAB — COMPREHENSIVE METABOLIC PANEL WITH GFR
AG Ratio: 3.1 (calc) — ABNORMAL HIGH (ref 1.0–2.5)
ALT: 14 U/L (ref 9–46)
AST: 16 U/L (ref 10–35)
Albumin: 4.3 g/dL (ref 3.6–5.1)
Alkaline phosphatase (APISO): 71 U/L (ref 35–144)
BUN/Creatinine Ratio: 33 (calc) — ABNORMAL HIGH (ref 6–22)
BUN: 29 mg/dL — ABNORMAL HIGH (ref 7–25)
CO2: 25 mmol/L (ref 20–32)
Calcium: 9.2 mg/dL (ref 8.6–10.3)
Chloride: 109 mmol/L (ref 98–110)
Creat: 0.88 mg/dL (ref 0.70–1.22)
Globulin: 1.4 g/dL — ABNORMAL LOW (ref 1.9–3.7)
Glucose, Bld: 111 mg/dL — ABNORMAL HIGH (ref 65–99)
Potassium: 4.4 mmol/L (ref 3.5–5.3)
Sodium: 141 mmol/L (ref 135–146)
Total Bilirubin: 0.4 mg/dL (ref 0.2–1.2)
Total Protein: 5.7 g/dL — ABNORMAL LOW (ref 6.1–8.1)
eGFR: 85 mL/min/1.73m2

## 2024-10-21 LAB — HEMOGLOBIN A1C
Hgb A1c MFr Bld: 6.9 % — ABNORMAL HIGH
Mean Plasma Glucose: 151 mg/dL
eAG (mmol/L): 8.4 mmol/L

## 2024-10-21 LAB — MAGNESIUM: Magnesium: 2.1 mg/dL (ref 1.5–2.5)

## 2024-10-21 LAB — BRAIN NATRIURETIC PEPTIDE: Brain Natriuretic Peptide: 34 pg/mL

## 2024-10-21 LAB — TSH: TSH: 1.83 m[IU]/L (ref 0.40–4.50)

## 2024-10-23 ENCOUNTER — Encounter: Payer: Self-pay | Admitting: Family Medicine

## 2024-11-06 ENCOUNTER — Encounter: Payer: Self-pay | Admitting: Cardiovascular Disease

## 2024-11-06 ENCOUNTER — Ambulatory Visit: Admitting: Cardiovascular Disease

## 2024-11-06 ENCOUNTER — Telehealth: Payer: Self-pay | Admitting: Cardiology

## 2024-11-06 VITALS — BP 96/58 | HR 84 | Resp 16 | Ht 71.0 in | Wt 182.0 lb

## 2024-11-06 DIAGNOSIS — E785 Hyperlipidemia, unspecified: Secondary | ICD-10-CM

## 2024-11-06 DIAGNOSIS — I9589 Other hypotension: Secondary | ICD-10-CM

## 2024-11-06 DIAGNOSIS — R42 Dizziness and giddiness: Secondary | ICD-10-CM | POA: Diagnosis not present

## 2024-11-06 DIAGNOSIS — E1169 Type 2 diabetes mellitus with other specified complication: Secondary | ICD-10-CM | POA: Diagnosis not present

## 2024-11-06 DIAGNOSIS — I25119 Atherosclerotic heart disease of native coronary artery with unspecified angina pectoris: Secondary | ICD-10-CM | POA: Diagnosis not present

## 2024-11-06 MED ORDER — MIDODRINE HCL 2.5 MG PO TABS: 2.5000 mg | ORAL_TABLET | Freq: Two times a day (BID) | ORAL | 6 refills | Status: AC

## 2024-11-06 MED ORDER — ALFUZOSIN HCL ER 10 MG PO TB24: 10.0000 mg | ORAL_TABLET | Freq: Every day | ORAL | 3 refills | Status: AC

## 2024-11-06 NOTE — Patient Instructions (Signed)
 Medication Instructions:  STOP TAMSULOSIN  START ALFUZOSIN  10 DAILY START MIDODRINE  2.5 MG TWICE A DAY *If you need a refill on your cardiac medications before your next appointment, please call your pharmacy*  Lab Work: NO LABS If you have labs (blood work) drawn today and your tests are completely normal, you will receive your results only by: MyChart Message (if you have MyChart) OR A paper copy in the mail If you have any lab test that is abnormal or we need to change your treatment, we will call you to review the results.  Testing/Procedures: GEOFFRY HEWS- Long Term Monitor Instructions  Your physician has requested you wear a ZIO patch monitor for 14 days.  This is a single patch monitor. [employer] supplies one patch monitor per enrollment. Additional stickers are not available. Please do not apply patch if you will be having a Nuclear Stress Test,  Echocardiogram, Cardiac CT, MRI, or Chest Xray during the period you would be wearing the  monitor. The patch cannot be worn during these tests. You cannot remove and re-apply the  ZIO XT patch monitor.  Your ZIO patch monitor will be mailed 3 day USPS to your address on file. It may take 3-5 days  to receive your monitor after you have been enrolled.  Once you have received your monitor, please review the enclosed instructions. Your monitor  has already been registered assigning a specific monitor serial # to you.  Billing and Patient Assistance Program Information  We have supplied [employer] with any of your insurance information on file for billing purposes. [employer] offers a sliding scale Patient Assistance Program for patients that do not have  insurance, or whose insurance does not completely cover the cost of the ZIO monitor.  You must apply for the Patient Assistance Program to qualify for this discounted rate.  To apply, please call [employer] at [phone], select option 4, select option 2, ask to apply for  Patient Assistance  Program. Meredeth will ask your household income, and how many people  are in your household. They will quote your out-of-pocket cost based on that information.  [employer] will also be able to set up a 74-month, interest-free payment plan if needed.  Applying the monitor   Shave hair from upper left chest.  Hold abrader disc by orange tab. Rub abrader in 40 strokes over the upper left chest as  indicated in your monitor instructions.  Clean area with 4 enclosed alcohol pads. Let dry.  Apply patch as indicated in monitor instructions. Patch will be placed under collarbone on left  side of chest with arrow pointing upward.  Rub patch adhesive wings for 2 minutes. Remove white label marked 1. Remove the white  label marked 2. Rub patch adhesive wings for 2 additional minutes.  While looking in a mirror, press and release button in center of patch. A small green light will  flash 3-4 times. This will be your only indicator that the monitor has been turned on.  Do not shower for the first 24 hours. You may shower after the first 24 hours.  Press the button if you feel a symptom. You will hear a small click. Record Date, Time and  Symptom in the Patient Logbook.  When you are ready to remove the patch, follow instructions on the last 2 pages of Patient  Logbook. Stick patch monitor onto the last page of Patient Logbook.  Place Patient Logbook in the blue and white box. Use locking tab on box and tape  box closed  securely. The blue and white box has prepaid postage on it. Please place it in the mailbox as  soon as possible. Your physician should have your test results approximately 7 days after the  monitor has been mailed back to [facility]  Call [facility] Customer Care at [phone] if you have questions regarding  your ZIO XT patch monitor. Call them immediately if you see an orange light blinking on your  monitor.  If your monitor falls off in less than 4 days, contact our  Monitor department at [phone].  If your monitor becomes loose or falls off after 4 days call [employer] at [phone] for  suggestions on securing your monitor   Follow-Up: At [facility], you and your health needs are our priority.  As part of our continuing mission to provide you with exceptional heart care, our providers are all part of one team.  This team includes your primary Cardiologist (physician) and Advanced Practice Providers or APPs (Physician Assistants and Nurse Practitioners) who all work together to provide you with the care you need, when you need it.  Your next appointment:   KEEP FOLLOW UP APRIL 20   Provider:   Alm Clay, MD

## 2024-11-06 NOTE — Telephone Encounter (Signed)
 Pt called requesting earlier appt with Dr Alexander Armstrong than April. Phone disconnected before I could ask what symptoms he was having. Please advise.

## 2024-11-06 NOTE — Telephone Encounter (Signed)
" °  Per MyChart scheduling message;  Need to discuss some unusual blood pressure variations and associated dizziness. Right now I'm holding back on Diltiazem , I need to get Dr Genice opinion on what if anything I need to change.   Can contact patient via MyChart  "

## 2024-11-06 NOTE — Progress Notes (Unsigned)
" °  Cardiology Office Note   Date:  11/06/2024  ID:  Alexander Armstrong, DOB 28-Oct-1940, MRN [id number] PCP: Katrinka Garnette KIDD, MD  [facility] Providers Cardiologist:  Alm Clay, MD Electrophysiologist:  Will Gladis Norton, MD { Click to update primary MD,subspecialty MD or APP then REFRESH:1}    History of Present Illness Alexander Armstrong is a 84 y.o. male with a history of CAD (remote anterior MI 2006, remote LAD and RCA stents, repeat LAD PCI-stent proximal to the previous stent in 2018, with overlapping stents, angioplasty for RCA in-stent restenosis 04/17/2024) type 2 diabetes mellitus, hypercholesterolemia, history of paroxysmal ventricular tachycardia and multiple myeloma in remission, mild anemia.    He has preserved left ventricular systolic function with an ejection fraction of 70% on a nuclear scintigram from June 2025.  He has not had an echocardiogram in the [facility] system but had a normal echo in 2022 at [facility].  He has a history of symptomatic hypotension and took midodrine  for a while.  Has an implantable loop recorder placed in 2014 that has reached end of service.  He is on chronic antiplatelet therapy with clopidogrel  due to overlapping stents in the LAD artery.  He had an EP study in 2014 for wide-complex tachycardia (noninducible).  Notes reported history of intolerance to mexiletine and amiodarone , but improvement in symptoms with diltiazem .  His recent arrhythmia monitor in November 2024 showed a 3% burden of PVCs and brief bursts of nonsustained VT, maximum 8 beats.  ROS: ***  Studies Reviewed      *** Risk Assessment/Calculations {Does this patient have ATRIAL FIBRILLATION?[phone]}         Physical Exam VS:  BP (!) 96/58   Pulse 84   Resp 16   Ht 5' 11 (1.803 m)   Wt 182 lb (82.6 kg)   SpO2 97%   BMI 25.38 kg/m        Wt Readings from Last 3 Encounters:  11/06/24 182 lb (82.6 kg)  10/20/24 184 lb 6.4 oz (83.6 kg)  09/28/24 181 lb (82.1 kg)    GEN:  Well nourished, well developed in no acute distress NECK: No JVD; No carotid bruits CARDIAC: ***RRR, no murmurs, rubs, gallops RESPIRATORY:  Clear to auscultation without rales, wheezing or rhonchi  ABDOMEN: Soft, non-tender, non-distended EXTREMITIES:  No edema; No deformity   ASSESSMENT AND PLAN ***    {Are you ordering a CV Procedure (e.g. stress test, cath, DCCV, TEE, etc)?   Press F2        :[id number]}  Dispo: ***  Signed, Jerel Balding, MD   "

## 2024-11-06 NOTE — Telephone Encounter (Signed)
Attempted to call pt, unable to reach. LMTCB.

## 2024-11-07 ENCOUNTER — Ambulatory Visit

## 2024-11-07 ENCOUNTER — Other Ambulatory Visit: Payer: Self-pay

## 2024-11-07 DIAGNOSIS — R42 Dizziness and giddiness: Secondary | ICD-10-CM

## 2024-11-07 NOTE — Progress Notes (Unsigned)
 Enrolled patient for a 14 day Zio XT  monitor to be mailed to patients home

## 2024-11-08 ENCOUNTER — Inpatient Hospital Stay: Admitting: Hematology & Oncology

## 2024-11-08 ENCOUNTER — Inpatient Hospital Stay: Attending: Hematology & Oncology

## 2024-11-08 VITALS — BP 106/72 | HR 62 | Temp 97.8°F | Resp 18 | Wt 182.4 lb

## 2024-11-08 DIAGNOSIS — C9001 Multiple myeloma in remission: Secondary | ICD-10-CM

## 2024-11-08 DIAGNOSIS — C9000 Multiple myeloma not having achieved remission: Secondary | ICD-10-CM

## 2024-11-08 LAB — CMP (CANCER CENTER ONLY)
ALT: 15 U/L (ref 0–44)
AST: 20 U/L (ref 15–41)
Albumin: 4.4 g/dL (ref 3.5–5.0)
Alkaline Phosphatase: 77 U/L (ref 38–126)
Anion gap: 11 (ref 5–15)
BUN: 30 mg/dL — ABNORMAL HIGH (ref 8–23)
CO2: 25 mmol/L (ref 22–32)
Calcium: 9.4 mg/dL (ref 8.9–10.3)
Chloride: 107 mmol/L (ref 98–111)
Creatinine: 1.02 mg/dL (ref 0.61–1.24)
GFR, Estimated: >60 mL/min
Glucose, Bld: 146 mg/dL — ABNORMAL HIGH (ref 70–99)
Potassium: 4.2 mmol/L (ref 3.5–5.1)
Sodium: 143 mmol/L (ref 135–145)
Total Bilirubin: 0.4 mg/dL (ref 0.0–1.2)
Total Protein: 6.3 g/dL — ABNORMAL LOW (ref 6.5–8.1)

## 2024-11-08 LAB — CBC WITH DIFFERENTIAL (CANCER CENTER ONLY)
Abs Immature Granulocytes: 0.03 10*3/uL (ref 0.00–0.07)
Basophils Absolute: 0.1 10*3/uL (ref 0.0–0.1)
Basophils Relative: 1 %
Eosinophils Absolute: 0.2 10*3/uL (ref 0.0–0.5)
Eosinophils Relative: 3 %
HCT: 39.5 % (ref 39.0–52.0)
Hemoglobin: 13.1 g/dL (ref 13.0–17.0)
Immature Granulocytes: 0 %
Lymphocytes Relative: 21 %
Lymphs Abs: 1.8 10*3/uL (ref 0.7–4.0)
MCH: 32.3 pg (ref 26.0–34.0)
MCHC: 33.2 g/dL (ref 30.0–36.0)
MCV: 97.3 fL (ref 80.0–100.0)
Monocytes Absolute: 0.8 10*3/uL (ref 0.1–1.0)
Monocytes Relative: 9 %
Neutro Abs: 5.9 10*3/uL (ref 1.7–7.7)
Neutrophils Relative %: 66 %
Platelet Count: 151 10*3/uL (ref 150–400)
RBC: 4.06 MIL/uL — ABNORMAL LOW (ref 4.22–5.81)
RDW: 12.5 % (ref 11.5–15.5)
WBC Count: 8.8 10*3/uL (ref 4.0–10.5)
nRBC: 0 % (ref 0.0–0.2)

## 2024-11-08 LAB — LACTATE DEHYDROGENASE: LDH: 130 U/L (ref 105–235)

## 2024-11-08 NOTE — Progress Notes (Signed)
 " Hematology and Oncology Follow Up Visit  Alexander Armstrong [id number] 07-May-1941 84 y.o. 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 --Start on 10/29/2022     Interim History:  Alexander Armstrong is back for follow-up.  Thankfully, he is doing okay right now.  We last saw him before Christmas.  His wife is having a lot of difficulties.  Thankfully, she seems to be doing better.  He has been off treatment now since October.  He is feeling all right.  He did have some problems with low blood pressure.  He went to see his family doctor.  He now is on midodrine .  When we last saw him in December.  For some reason, the lab did not run the monoclonal studies.  I am unsure as to why they did not.  He has had no problems with bleeding or bruising.  There is been no change in bowel or bladder habits.  He has had no rashes.  There has been no leg swelling.  He has had no cough.  Overall, I would say that his performance status is probably ECOG 1.   Wt Readings from Last 3 Encounters:  11/08/24 182 lb 6.4 oz (82.7 kg)  11/06/24 182 lb (82.6 kg)  10/20/24 184 lb 6.4 oz (83.6 kg)     Medications:  Current Outpatient Medications:    ACCU-CHEK GUIDE TEST test strip, USE TO TEST BLOOD SUGAR 3 TIMES A DAY, Disp: 100 strip, Rfl: 0   Accu-Chek Softclix Lancets lancets, 1 EACH BY DOES NOT APPLY ROUTE IN THE MORNING, AT NOON, AND AT BEDTIME. MAY SUBSTITUTE TO ANY MANUFACTURER COVERED BY PATIENT'S INSURANCE., Disp: 100 each, Rfl: 3   acetaminophen  (TYLENOL ) 500 MG tablet, Take 500 mg by mouth 3 (three) times daily as needed for moderate pain (pain score  4-6) or headache., Disp: , Rfl:    alfuzosin  (UROXATRAL ) 10 MG 24 hr tablet, Take 1 tablet (10 mg total) by mouth daily with breakfast., Disp: 90 tablet, Rfl: 3   aspirin  EC 81 MG tablet, Take 81 mg by mouth daily. Swallow whole., Disp: , Rfl:    Blood Glucose Monitoring Suppl DEVI, 1 each by Does not apply route in the morning, at noon, and at bedtime. May substitute to any manufacturer covered by patient's insurance., Disp: 1 each, Rfl: 0   Cholecalciferol (VITAMIN D3) 1000 units CAPS, Take 1,000 Units by mouth at bedtime., Disp: , Rfl:    clopidogrel  (PLAVIX ) 75 MG tablet, TAKE 1 TABLET (75 MG TOTAL) BY MOUTH DAILY., Disp: 90 tablet, Rfl: 2   cyanocobalamin  (VITAMIN B12) 1000 MCG tablet, Take 1,000 mcg by mouth daily., Disp: , Rfl:    dexamethasone  (DECADRON ) 4 MG tablet, TAKE 5 TABS (20 MG) WEEKLY THE DAY OF DARATUMUMAB . TAKE WITH BREAKFAST., Disp: 60 tablet, Rfl: 5   diltiazem  (TIADYLT  ER) 120 MG 24 hr capsule, Take 1 capsule (120 mg total) by mouth daily. (Patient taking differently: Take 120 mg by mouth daily. Holding per Dr. Katrinka), Disp: 90 capsule, Rfl: 3   diphenhydrAMINE  (BENADRYL  ALLERGY) 25 MG tablet, Take 50 mg by mouth See admin instructions. Take 50 mg by mouth one hour prior to Daratumumab  infusion  on Mondays, Disp: , Rfl:    Docusate Sodium  (DSS) 100 MG CAPS, Take 100 mg by mouth daily., Disp: , Rfl:    famciclovir  (FAMVIR ) 250 MG tablet, TAKE 1 TABLET BY MOUTH EVERY DAY, Disp: 90 tablet, Rfl: 2   fluticasone (FLONASE) 50 MCG/ACT nasal spray, Place 1 spray into both nostrils daily as needed for allergies or rhinitis., Disp: , Rfl:    furosemide  (LASIX ) 20 MG tablet, Take 1 tablet (20 mg total) by mouth daily as needed., Disp: 30 tablet, Rfl: 11   loratadine (CLARITIN) 10 MG tablet, Take 10 mg by mouth in the morning., Disp: , Rfl:    MAGNESIUM PO, Take 1 tablet by mouth daily., Disp: , Rfl:    meclizine  (ANTIVERT ) 25 MG tablet, Take 1 tablet (25 mg total) by mouth 3 (three) times  daily as needed for dizziness., Disp: 30 tablet, Rfl: 5   midodrine  (PROAMATINE ) 2.5 MG tablet, Take 1 tablet (2.5 mg total) by mouth in the morning and at bedtime., Disp: 60 tablet, Rfl: 6   montelukast  (SINGULAIR ) 10 MG tablet, TAKE 1 TABLET BY MOUTH EVERYDAY AT BEDTIME, Disp: 90 tablet, Rfl: 2   nitroGLYCERIN  (NITROSTAT ) 0.4 MG SL tablet, Place 1 tablet (0.4 mg total) under the tongue every 5 (five) minutes as needed., Disp: 25 tablet, Rfl: 2   pantoprazole  (PROTONIX ) 40 MG tablet, TAKE 1 TABLET BY MOUTH EVERY DAY, Disp: 90 tablet, Rfl: 3   polyethylene glycol (MIRALAX  / GLYCOLAX ) 17 g packet, Take 17 g by mouth daily as needed for moderate constipation., Disp: , Rfl:    REFRESH PLUS 0.5 % SOLN, Place 1 drop into both eyes 3 (three) times daily as needed (for dryness)., Disp: , Rfl:    rosuvastatin  (CRESTOR ) 20 MG tablet, TAKE 1 TABLET BY MOUTH EVERY DAY, Disp: 90 tablet, Rfl: 3  Allergies:  Allergies  Allergen Reactions   Latex Other (See Comments)    takes the skin off (tape)    Past Medical History, Surgical history, Social history, and Family History were reviewed and updated.  Review of Systems: Review of Systems  Constitutional: Negative.   HENT:  Negative.    Eyes: Negative.   Respiratory: Negative.    Cardiovascular: Negative.   Gastrointestinal: Negative.   Endocrine: Negative.   Genitourinary: Negative.    Musculoskeletal: Negative.   Skin: Negative.   Neurological: Negative.   Hematological: Negative.   Psychiatric/Behavioral: Negative.      Physical Exam:  Vitals:   11/08/24 1012  BP: 106/72  Pulse: 62  Resp: 18  Temp: 97.8 F (36.6 C)  SpO2: 96%     Wt Readings from Last 3 Encounters:  11/08/24 182 lb 6.4 oz (82.7 kg)  11/06/24 182 lb (82.6 kg)  10/20/24 184 lb 6.4 oz (83.6 kg)    Physical Exam Vitals reviewed.  HENT:     Head: Normocephalic and atraumatic.  Eyes:     Pupils: Pupils are equal, round, and reactive to light.  Cardiovascular:      Rate and Rhythm: Normal rate and regular rhythm.     Heart sounds: Normal heart sounds.  Pulmonary:     Effort: Pulmonary effort is normal.     Breath sounds: Normal breath sounds.  Abdominal:     General: Bowel sounds are normal.     Palpations: Abdomen is soft.  Musculoskeletal:        General: No tenderness or deformity. Normal range of motion.     Cervical back: Normal range of  motion.  Lymphadenopathy:     Cervical: No cervical adenopathy.  Skin:    General: Skin is warm and dry.     Findings: No erythema or rash.  Neurological:     Mental Status: He is alert and oriented to person, place, and time.  Psychiatric:        Behavior: Behavior normal.        Thought Content: Thought content normal.        Judgment: Judgment normal.     Lab Results  Component Value Date   WBC 8.8 11/08/2024   HGB 13.1 11/08/2024   HCT 39.5 11/08/2024   MCV 97.3 11/08/2024   PLT 151 11/08/2024     Chemistry      Component Value Date/Time   NA 143 11/08/2024 0922   K 4.2 11/08/2024 0922   CL 107 11/08/2024 0922   CO2 25 11/08/2024 0922   BUN 30 (H) 11/08/2024 0922   CREATININE 1.02 11/08/2024 0922   CREATININE 0.88 10/20/2024 1634      Component Value Date/Time   CALCIUM  9.4 11/08/2024 0922   ALKPHOS 77 11/08/2024 0922   AST 20 11/08/2024 0922   ALT 15 11/08/2024 0922   BILITOT 0.4 11/08/2024 0922       Impression and Plan: Mr. Canipe is a very nice 84 year old white male.  He has IgG kappa myeloma.  His chromosomal abnormalities resolved with treatment.   For right now, we are just following along.  Everything is looking quite good right now.  I am glad that he made it through the Holiday season.  I am also quite happy that his wife is doing better.  I will plan to see him back in about 6-8 weeks.  Hopefully, we will see that his monoclonal studies are still showing him to be in a remission or at least a very good partial remission.     Maude JONELLE Crease,  MD 2/4/202610:29 AM "

## 2024-11-09 LAB — IGG, IGA, IGM
IgA: 25 mg/dL — ABNORMAL LOW (ref 61–437)
IgG (Immunoglobin G), Serum: 491 mg/dL — ABNORMAL LOW (ref 603–1613)
IgM (Immunoglobulin M), Srm: <5 mg/dL — ABNORMAL LOW (ref 15–143)

## 2024-11-09 LAB — KAPPA/LAMBDA LIGHT CHAINS
Kappa free light chain: 11.3 mg/L (ref 3.3–19.4)
Kappa, lambda light chain ratio: 1.47 (ref 0.26–1.65)
Lambda free light chains: 7.7 mg/L (ref 5.7–26.3)

## 2024-12-28 ENCOUNTER — Inpatient Hospital Stay

## 2024-12-28 ENCOUNTER — Inpatient Hospital Stay: Admitting: Hematology & Oncology

## 2025-01-22 ENCOUNTER — Ambulatory Visit: Admitting: Cardiology
